# Patient Record
Sex: Female | Born: 1935
Health system: Southern US, Community
[De-identification: ages and names within clinical notes are randomized; demographics above are authoritative.]

## PROBLEM LIST (undated history)

## (undated) ENCOUNTER — Emergency Department (HOSPITAL_BASED_OUTPATIENT_CLINIC_OR_DEPARTMENT_OTHER): Payer: Medicare Other

## (undated) DIAGNOSIS — F32A Depression, unspecified: Secondary | ICD-10-CM

## (undated) DIAGNOSIS — C169 Malignant neoplasm of stomach, unspecified: Secondary | ICD-10-CM

## (undated) DIAGNOSIS — F329 Major depressive disorder, single episode, unspecified: Secondary | ICD-10-CM

## (undated) DIAGNOSIS — M1990 Unspecified osteoarthritis, unspecified site: Secondary | ICD-10-CM

## (undated) DIAGNOSIS — F419 Anxiety disorder, unspecified: Secondary | ICD-10-CM

## (undated) DIAGNOSIS — H269 Unspecified cataract: Secondary | ICD-10-CM

## (undated) DIAGNOSIS — D126 Benign neoplasm of colon, unspecified: Secondary | ICD-10-CM

## (undated) DIAGNOSIS — C50919 Malignant neoplasm of unspecified site of unspecified female breast: Secondary | ICD-10-CM

## (undated) DIAGNOSIS — N289 Disorder of kidney and ureter, unspecified: Secondary | ICD-10-CM

## (undated) DIAGNOSIS — I10 Essential (primary) hypertension: Secondary | ICD-10-CM

## (undated) DIAGNOSIS — Z5189 Encounter for other specified aftercare: Secondary | ICD-10-CM

## (undated) DIAGNOSIS — Z8661 Personal history of infections of the central nervous system: Secondary | ICD-10-CM

## (undated) DIAGNOSIS — E785 Hyperlipidemia, unspecified: Secondary | ICD-10-CM

## (undated) DIAGNOSIS — Z923 Personal history of irradiation: Secondary | ICD-10-CM

## (undated) HISTORY — DX: Unspecified osteoarthritis, unspecified site: M19.90

## (undated) HISTORY — PX: OOPHORECTOMY: SHX86

## (undated) HISTORY — DX: Major depressive disorder, single episode, unspecified: F32.9

## (undated) HISTORY — DX: Personal history of infections of the central nervous system: Z86.61

## (undated) HISTORY — DX: Depression, unspecified: F32.A

## (undated) HISTORY — PX: OTHER SURGICAL HISTORY: SHX169

## (undated) HISTORY — DX: Unspecified cataract: H26.9

## (undated) HISTORY — DX: Benign neoplasm of colon, unspecified: D12.6

## (undated) HISTORY — DX: Malignant neoplasm of unspecified site of unspecified female breast: C50.919

## (undated) HISTORY — DX: Encounter for other specified aftercare: Z51.89

## (undated) HISTORY — PX: MASTECTOMY: SHX3

## (undated) HISTORY — DX: Hyperlipidemia, unspecified: E78.5

## (undated) HISTORY — DX: Anxiety disorder, unspecified: F41.9

## (undated) HISTORY — DX: Malignant neoplasm of stomach, unspecified: C16.9

## (undated) HISTORY — PX: UPPER GASTROINTESTINAL ENDOSCOPY: SHX188

## (undated) HISTORY — PX: CHOLECYSTECTOMY: SHX55

## (undated) HISTORY — PX: COLONOSCOPY: SHX174

## (undated) HISTORY — PX: BREAST SURGERY: SHX581

## (undated) NOTE — *Deleted (*Deleted)
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 Highly suspicious left breast mass at the 12 o'clock position 4 cm from the nipple. Recommendation is for ultrasound-guided biopsy. 2. Indeterminate hyperechoic mass at the 12 o'clock position 4 cm from the nipple, just superior to the index lesion. This may correspond with an additional asymmetry seen mammographically. Recommendation is for ultrasound-guided biopsy with close attention on post clip films to ensure mammographic correlation. 3. No suspicious left axillary lymphadenopathy. 4. No suspicious findings at the site of the right mastectomy bed palpable lump.   11/19/2019 Cancer Staging   Staging form: Breast, AJCC 8th Edition - Clinical stage from 11/19/2019: Stage IB (cT2, cN0, cM0, G2, ER+, PR+, HER2-) - Signed by Alla Feeling, NP on 11/23/2019   11/19/2019 Initial Biopsy   Diagnosis Breast, left, needle core biopsy, 12 o'clock - INVASIVE MAMMARY CARCINOMA - SEE COMMENT E-cadherin is POSITIVE supporting a ductal origin. PROGNOSTIC INDICATORS Results: IMMUNOHISTOCHEMICAL AND MORPHOMETRIC ANALYSIS PERFORMED MANUALLY The tumor cells are NEGATIVE for Her2 (1+). Estrogen Receptor: 95%, POSITIVE, STRONG STAINING INTENSITY Progesterone Receptor: 95%, POSITIVE, STRONG STAINING INTENSITY Proliferation Marker Ki67: 10%   11/23/2019 Initial Diagnosis   Malignant  neoplasm of left breast ([place])      CURRENT THERAPY:  ***  INTERVAL HISTORY: *** Cindy Wong is here for a follow up. She presents to the clinic alone.    REVIEW OF SYSTEMS:  *** Constitutional: Denies fevers, chills or abnormal weight loss Eyes: Denies blurriness of vision Ears, nose, mouth, throat, and face: Denies mucositis or sore throat Respiratory: Denies cough, dyspnea or wheezes Cardiovascular: Denies palpitation, chest discomfort or lower extremity swelling Gastrointestinal:  Denies nausea, heartburn or change in bowel habits Skin: Denies abnormal skin rashes Lymphatics: Denies new lymphadenopathy or easy bruising Neurological:Denies numbness, tingling or new weaknesses Behavioral/Psych: Mood is stable, no new changes  All other systems were reviewed with the patient and are negative.  MEDICAL HISTORY:  Past Medical History:  Diagnosis Date  . Adenomatous colon polyp    tubular  . Anxiety   . Arthritis   . Blood transfusion without reported diagnosis   . Breast cancer [facility]) 2007   right mastectomy  . Breast cancer ([place]) 10/2019   left breast IDC  . Cataract   . Depression   . Diabetes mellitus   . Hx of meningitis 2012  . Hyperlipemia   . Hypertension   . Personal history of radiation therapy   . Stomach cancer ([place]) 1997   partial gastrectomy    SURGICAL HISTORY: Past Surgical History:  Procedure Laterality Date  . ABDOMINAL HYSTERECTOMY  1974   Supracervical  . BREAST LUMPECTOMY Right 1993  . BREAST SURGERY     Mastectomy  . CHOLECYSTECTOMY    . COLONOSCOPY    . MASTECTOMY Right   . MASTECTOMY, PARTIAL Left 12/08/2019   Procedure: LEFT MASTECTOMY;  Surgeon: Donnie Mesa, MD;  Location: [facility];  Service: General;  Laterality: Left;  LMA AND PECTORAL BLOCK  . meningitis    . OOPHORECTOMY     One ovary removed--?side  . Stomach tumor    . UPPER GASTROINTESTINAL ENDOSCOPY      I have reviewed the social history and  family history with the patient and they are unchanged from previous note.  ALLERGIES:  is allergic to sulfonamide derivatives and sulfamethoxazole.  MEDICATIONS:  Current Outpatient Medications  Medication Sig Dispense Refill  . amLODipine (NORVASC) 5 MG tablet Take 5 mg by mouth daily. In the morning    . carvedilol (COREG) 6.25 MG tablet TAKE 1 TABLET(6.25 MG) BY MOUTH TWICE DAILY 60 tablet 3  . Cholecalciferol (D2000 ULTRA STRENGTH) 50 MCG (2000 UT) CAPS Take by mouth daily.    Marland Kitchen glipiZIDE (GLUCOTROL XL) 10 MG 24 hr tablet Take 10 mg by mouth daily.      . hydrochlorothiazide (HYDRODIURIL) 25 MG tablet Take 25 mg by mouth daily. In the morning  10  . HYDROcodone-acetaminophen (NORCO/VICODIN) 5-325 MG tablet Take 1 tablet by mouth every 6 (six) hours as needed for moderate pain. 20 tablet 0  . Linagliptin-Metformin HCl (JENTADUETO) 2.5-500 MG TABS Take 1 tablet by mouth 2 (two) times daily.     Marland Kitchen lisinopril (PRINIVIL,ZESTRIL) 40 MG tablet Take 40 mg by mouth daily. In the morning  0  . Multiple Vitamins-Minerals (CENTRUM SILVER PO) Take 1 tablet by mouth daily.     Glory Rosebush VERIO test strip 2 (two) times daily. for testing  2  . potassium chloride SA (K-DUR,KLOR-CON) 20 MEQ tablet Take 20 mEq by mouth daily.    . pravastatin (PRAVACHOL) 40 MG tablet Take 40 mg by mouth every evening.     Marland Kitchen ROYAL JELLY PO Take by mouth daily.     No current facility-administered medications for this visit.    PHYSICAL EXAMINATION: ECOG PERFORMANCE STATUS: {CHL ONC ECOG PS:[phone]}  There were no vitals filed for this visit. There were no vitals filed for this visit. *** GENERAL:alert, no distress and comfortable SKIN: skin color, texture, turgor are normal, no rashes or significant lesions EYES: normal, Conjunctiva are pink and non-injected, sclera clear {OROPHARYNX:no exudate, no erythema and lips, buccal mucosa, and tongue normal}  NECK: supple, thyroid normal size, non-tender, without  nodularity LYMPH:  no palpable lymphadenopathy in the cervical, axillary {or inguinal} LUNGS: clear to auscultation and percussion with normal breathing effort HEART: regular rate & rhythm and no murmurs and no lower extremity edema ABDOMEN:abdomen soft, non-tender and normal bowel sounds Musculoskeletal:no cyanosis of digits and no clubbing  NEURO: alert & oriented x 3 with fluent speech, no focal motor/sensory deficits  LABORATORY DATA:  I have reviewed the data as listed CBC Latest Ref Rng & Units 11/23/2019 11/21/2018 05/08/2018  WBC 4.0 - 10.5 K/uL 7.9 7.3 11.0(H)  Hemoglobin 12.0 - 15.0 g/dL 14.8 15.3(H) 13.4  Hematocrit 36 - 46 % 47.1(H) 48.9(H) 43.8  Platelets 150 - 400 K/uL 277 268 245     CMP Latest Ref Rng & Units 12/04/2019 11/23/2019 11/21/2018  Glucose 70 - 99 mg/dL 114(H) 134(H) 101(H)  BUN 8 - 23 mg/dL 23 19 18   Creatinine 0.44 - 1.00 mg/dL 1.02(H) 0.93 0.89  Sodium 135 - 145 mmol/L 140 140 142  Potassium 3.5 - 5.1 mmol/L 4.2 3.9 4.1  Chloride 98 - 111 mmol/L 105 106 106  CO2 22 - 32 mmol/L 24 29 25   Calcium 8.9 - 10.3 mg/dL 9.9 10.0  10.1  Total Protein 6.5 - 8.1 g/dL - 7.3 7.3  Total Bilirubin 0.3 - 1.2 mg/dL - 0.7 0.5  Alkaline Phos 38 - 126 U/L - 47 55  AST 15 - 41 U/L - 21 18  ALT 0 - 44 U/L - 16 13      RADIOGRAPHIC STUDIES: I have personally reviewed the radiological images as listed and agreed with the findings in the report. No results found.   ASSESSMENT & PLAN:  Cindy Wong is a 67 y.o. female with    1. Malignant neoplasm of the left breast, grade 2, ER 95% strongly positive, PR 95% strongly positive, HER-2 negative (1+) Ki-67 10%, cT2 N0 M0 stage Ib -Diangosed in 10/2019 by ound on routine screening mammogram in 10/2019. She underwent left mastectomy with Dr Georgette Dover on 12/08/19.  ***  2. Genetics  -She has personal h/o DCIS and GIST, now new left breast cancer. She qualifies for genetics.  -Sister passed from cancer, unknown type -Does not  have children, has sibling and nieces  -I previously referred her to genetics. She did not proceed with visit.   3. History of recurrent right breast DCIS  -Diagnosed initially in 1993, s/p lumpectomy and adjuvant radiation.  She reportedly tried antiestrogen therapy but did not tolerate well -She had a recurrence of ER/PR positive DCIS in 07/2005, s/p right mastectomy on 08/24/2005 -She has been compliant with surveillance plan  4. H/O GIST -Diagnosed in 1997, s/p partial gastrectomy, followed by Dr. Ardis Hughs  -no clinical signs of recurrence   5. Social  -Sister passed in June from cancer (in the abdomen ?), she was depressed for 3-4 months this year but better recently  -Has [place] provider at [facility] f/u today, will wait until f/u with surgery  -Encouraged to have brother come with her to appt    PLAN: ***   No problem-specific Assessment & Plan notes found for this encounter.   No orders of the defined types were placed in this encounter.  All questions were answered. The patient knows to call the clinic with any problems, questions or concerns. No barriers to learning was detected. The total time spent in the appointment was {CHL ONC TIME VISIT - [id number].     Joslyn Devon 12/23/2019   Oneal Deputy, am acting as scribe for Truitt Merle, MD.   {Add scribe attestation statement}

---

## 1972-02-20 HISTORY — PX: ABDOMINAL HYSTERECTOMY: SHX81

## 1991-02-20 HISTORY — PX: BREAST LUMPECTOMY: SHX2

## 1995-02-20 DIAGNOSIS — C169 Malignant neoplasm of stomach, unspecified: Secondary | ICD-10-CM

## 1995-02-20 HISTORY — DX: Malignant neoplasm of stomach, unspecified: C16.9

## 1997-08-19 ENCOUNTER — Ambulatory Visit (HOSPITAL_COMMUNITY): Admission: RE | Admit: 1997-08-19 | Discharge: 1997-08-19 | Payer: Self-pay | Admitting: *Deleted

## 1998-06-27 ENCOUNTER — Other Ambulatory Visit: Admission: RE | Admit: 1998-06-27 | Discharge: 1998-06-27 | Payer: Self-pay | Admitting: *Deleted

## 1998-10-11 ENCOUNTER — Encounter (INDEPENDENT_AMBULATORY_CARE_PROVIDER_SITE_OTHER): Payer: Self-pay

## 1998-10-11 ENCOUNTER — Ambulatory Visit (HOSPITAL_COMMUNITY): Admission: RE | Admit: 1998-10-11 | Discharge: 1998-10-11 | Payer: Self-pay | Admitting: *Deleted

## 1999-07-19 ENCOUNTER — Other Ambulatory Visit: Admission: RE | Admit: 1999-07-19 | Discharge: 1999-07-19 | Payer: Self-pay | Admitting: *Deleted

## 1999-08-18 ENCOUNTER — Encounter: Admission: RE | Admit: 1999-08-18 | Discharge: 1999-08-18 | Payer: Self-pay | Admitting: Internal Medicine

## 1999-08-18 ENCOUNTER — Encounter: Payer: Self-pay | Admitting: Internal Medicine

## 1999-12-09 ENCOUNTER — Ambulatory Visit (HOSPITAL_COMMUNITY): Admission: RE | Admit: 1999-12-09 | Discharge: 1999-12-09 | Payer: Self-pay | Admitting: Oncology

## 1999-12-09 ENCOUNTER — Encounter (HOSPITAL_COMMUNITY): Payer: Self-pay | Admitting: Oncology

## 2000-11-05 ENCOUNTER — Other Ambulatory Visit: Admission: RE | Admit: 2000-11-05 | Discharge: 2000-11-05 | Payer: Self-pay | Admitting: Obstetrics and Gynecology

## 2001-04-28 ENCOUNTER — Encounter: Admission: RE | Admit: 2001-04-28 | Discharge: 2001-07-27 | Payer: Self-pay | Admitting: Internal Medicine

## 2001-08-11 ENCOUNTER — Encounter (HOSPITAL_COMMUNITY): Payer: Self-pay | Admitting: Oncology

## 2001-08-11 ENCOUNTER — Ambulatory Visit (HOSPITAL_COMMUNITY): Admission: RE | Admit: 2001-08-11 | Discharge: 2001-08-11 | Payer: Self-pay | Admitting: Oncology

## 2001-09-10 ENCOUNTER — Encounter (INDEPENDENT_AMBULATORY_CARE_PROVIDER_SITE_OTHER): Payer: Self-pay | Admitting: Specialist

## 2001-09-10 ENCOUNTER — Ambulatory Visit (HOSPITAL_COMMUNITY): Admission: RE | Admit: 2001-09-10 | Discharge: 2001-09-10 | Payer: Self-pay | Admitting: *Deleted

## 2001-11-05 ENCOUNTER — Other Ambulatory Visit: Admission: RE | Admit: 2001-11-05 | Discharge: 2001-11-05 | Payer: Self-pay | Admitting: Obstetrics and Gynecology

## 2001-11-07 ENCOUNTER — Ambulatory Visit (HOSPITAL_COMMUNITY): Admission: RE | Admit: 2001-11-07 | Discharge: 2001-11-07 | Payer: Self-pay | Admitting: *Deleted

## 2002-08-13 ENCOUNTER — Encounter: Admission: RE | Admit: 2002-08-13 | Discharge: 2002-11-11 | Payer: Self-pay | Admitting: Internal Medicine

## 2002-11-10 ENCOUNTER — Other Ambulatory Visit: Admission: RE | Admit: 2002-11-10 | Discharge: 2002-11-10 | Payer: Self-pay | Admitting: Obstetrics and Gynecology

## 2004-03-12 ENCOUNTER — Emergency Department (HOSPITAL_COMMUNITY): Admission: EM | Admit: 2004-03-12 | Discharge: 2004-03-12 | Payer: Self-pay | Admitting: Emergency Medicine

## 2004-08-28 ENCOUNTER — Ambulatory Visit: Payer: Self-pay | Admitting: Oncology

## 2004-11-13 ENCOUNTER — Other Ambulatory Visit: Admission: RE | Admit: 2004-11-13 | Discharge: 2004-11-13 | Payer: Self-pay | Admitting: Obstetrics and Gynecology

## 2005-02-19 DIAGNOSIS — C50919 Malignant neoplasm of unspecified site of unspecified female breast: Secondary | ICD-10-CM

## 2005-02-19 HISTORY — DX: Malignant neoplasm of unspecified site of unspecified female breast: C50.919

## 2005-06-07 ENCOUNTER — Ambulatory Visit: Payer: Self-pay | Admitting: Oncology

## 2005-06-07 LAB — CBC WITH DIFFERENTIAL/PLATELET
BASO%: 1.2 % (ref 0.0–2.0)
Basophils Absolute: 0.1 10*3/uL (ref 0.0–0.1)
EOS%: 1.3 % (ref 0.0–7.0)
Eosinophils Absolute: 0.1 10*3/uL (ref 0.0–0.5)
HCT: 40.8 % (ref 34.8–46.6)
HGB: 13.3 g/dL (ref 11.6–15.9)
LYMPH%: 28.6 % (ref 14.0–48.0)
MCH: 26.5 pg (ref 26.0–34.0)
MCHC: 32.5 g/dL (ref 32.0–36.0)
MCV: 81.7 fL (ref 81.0–101.0)
MONO#: 0.6 10*3/uL (ref 0.1–0.9)
MONO%: 6.5 % (ref 0.0–13.0)
NEUT#: 5.3 10*3/uL (ref 1.5–6.5)
NEUT%: 62.4 % (ref 39.6–76.8)
Platelets: 361 10*3/uL (ref 145–400)
RBC: 5 10*6/uL (ref 3.70–5.32)
RDW: 13.7 % (ref 11.3–14.5)
WBC: 8.5 10*3/uL (ref 3.9–10.0)
lymph#: 2.4 10*3/uL (ref 0.9–3.3)

## 2005-06-07 LAB — COMPREHENSIVE METABOLIC PANEL
ALT: 25 U/L (ref 0–40)
AST: 23 U/L (ref 0–37)
Albumin: 4.3 g/dL (ref 3.5–5.2)
Alkaline Phosphatase: 44 U/L (ref 39–117)
BUN: 17 mg/dL (ref 6–23)
CO2: 30 mEq/L (ref 19–32)
Calcium: 9.9 mg/dL (ref 8.4–10.5)
Chloride: 105 mEq/L (ref 96–112)
Creatinine, Ser: 1 mg/dL (ref 0.4–1.2)
Glucose, Bld: 67 mg/dL — ABNORMAL LOW (ref 70–99)
Potassium: 3.9 mEq/L (ref 3.5–5.3)
Sodium: 142 mEq/L (ref 135–145)
Total Bilirubin: 0.3 mg/dL (ref 0.3–1.2)
Total Protein: 7 g/dL (ref 6.0–8.3)

## 2005-06-07 LAB — LACTATE DEHYDROGENASE: LDH: 156 U/L (ref 94–250)

## 2005-08-03 ENCOUNTER — Encounter: Admission: RE | Admit: 2005-08-03 | Discharge: 2005-08-03 | Payer: Self-pay | Admitting: Internal Medicine

## 2005-08-03 ENCOUNTER — Encounter (INDEPENDENT_AMBULATORY_CARE_PROVIDER_SITE_OTHER): Payer: Self-pay | Admitting: Diagnostic Radiology

## 2005-08-03 ENCOUNTER — Encounter (INDEPENDENT_AMBULATORY_CARE_PROVIDER_SITE_OTHER): Payer: Self-pay | Admitting: *Deleted

## 2005-08-14 ENCOUNTER — Encounter: Admission: RE | Admit: 2005-08-14 | Discharge: 2005-08-14 | Payer: Self-pay | Admitting: Internal Medicine

## 2005-08-17 ENCOUNTER — Encounter: Admission: RE | Admit: 2005-08-17 | Discharge: 2005-08-17 | Payer: Self-pay

## 2005-08-24 ENCOUNTER — Encounter (INDEPENDENT_AMBULATORY_CARE_PROVIDER_SITE_OTHER): Payer: Self-pay | Admitting: Specialist

## 2005-08-24 ENCOUNTER — Ambulatory Visit (HOSPITAL_COMMUNITY): Admission: RE | Admit: 2005-08-24 | Discharge: 2005-08-25 | Payer: Self-pay

## 2006-02-08 ENCOUNTER — Encounter: Admission: RE | Admit: 2006-02-08 | Discharge: 2006-02-08 | Payer: Self-pay | Admitting: Internal Medicine

## 2006-06-03 ENCOUNTER — Ambulatory Visit: Payer: Self-pay | Admitting: Oncology

## 2006-06-06 LAB — CBC WITH DIFFERENTIAL/PLATELET
BASO%: 1.3 % (ref 0.0–2.0)
Basophils Absolute: 0.1 10*3/uL (ref 0.0–0.1)
EOS%: 2 % (ref 0.0–7.0)
Eosinophils Absolute: 0.1 10*3/uL (ref 0.0–0.5)
HCT: 41.3 % (ref 34.8–46.6)
HGB: 13.9 g/dL (ref 11.6–15.9)
LYMPH%: 32.5 % (ref 14.0–48.0)
MCH: 27.4 pg (ref 26.0–34.0)
MCHC: 33.6 g/dL (ref 32.0–36.0)
MCV: 81.6 fL (ref 81.0–101.0)
MONO#: 0.4 10*3/uL (ref 0.1–0.9)
MONO%: 6 % (ref 0.0–13.0)
NEUT#: 4.1 10*3/uL (ref 1.5–6.5)
NEUT%: 58.2 % (ref 39.6–76.8)
Platelets: 318 10*3/uL (ref 145–400)
RBC: 5.06 10*6/uL (ref 3.70–5.32)
RDW: 12.9 % (ref 11.3–14.5)
WBC: 7.1 10*3/uL (ref 3.9–10.0)
lymph#: 2.3 10*3/uL (ref 0.9–3.3)

## 2006-06-06 LAB — COMPREHENSIVE METABOLIC PANEL
ALT: 17 U/L (ref 0–35)
AST: 20 U/L (ref 0–37)
Albumin: 4.4 g/dL (ref 3.5–5.2)
Alkaline Phosphatase: 44 U/L (ref 39–117)
BUN: 22 mg/dL (ref 6–23)
CO2: 28 mEq/L (ref 19–32)
Calcium: 9.9 mg/dL (ref 8.4–10.5)
Chloride: 102 mEq/L (ref 96–112)
Creatinine, Ser: 1.12 mg/dL (ref 0.40–1.20)
Glucose, Bld: 138 mg/dL — ABNORMAL HIGH (ref 70–99)
Potassium: 4.2 mEq/L (ref 3.5–5.3)
Sodium: 140 mEq/L (ref 135–145)
Total Bilirubin: 0.5 mg/dL (ref 0.3–1.2)
Total Protein: 7.2 g/dL (ref 6.0–8.3)

## 2006-06-06 LAB — LACTATE DEHYDROGENASE: LDH: 155 U/L (ref 94–250)

## 2006-07-25 ENCOUNTER — Ambulatory Visit (HOSPITAL_COMMUNITY): Admission: RE | Admit: 2006-07-25 | Discharge: 2006-07-25 | Payer: Self-pay | Admitting: *Deleted

## 2006-07-25 ENCOUNTER — Encounter (INDEPENDENT_AMBULATORY_CARE_PROVIDER_SITE_OTHER): Payer: Self-pay | Admitting: *Deleted

## 2006-08-06 ENCOUNTER — Encounter: Admission: RE | Admit: 2006-08-06 | Discharge: 2006-08-06 | Payer: Self-pay | Admitting: Internal Medicine

## 2006-11-21 ENCOUNTER — Other Ambulatory Visit: Admission: RE | Admit: 2006-11-21 | Discharge: 2006-11-21 | Payer: Self-pay | Admitting: Obstetrics and Gynecology

## 2006-11-25 ENCOUNTER — Encounter: Admission: RE | Admit: 2006-11-25 | Discharge: 2006-11-25 | Payer: Self-pay | Admitting: Obstetrics and Gynecology

## 2006-12-03 ENCOUNTER — Ambulatory Visit: Payer: Self-pay | Admitting: Oncology

## 2006-12-24 ENCOUNTER — Ambulatory Visit (HOSPITAL_COMMUNITY): Admission: RE | Admit: 2006-12-24 | Discharge: 2006-12-24 | Payer: Self-pay | Admitting: Oncology

## 2006-12-24 LAB — COMPREHENSIVE METABOLIC PANEL
ALT: 15 U/L (ref 0–35)
AST: 17 U/L (ref 0–37)
Albumin: 4.2 g/dL (ref 3.5–5.2)
Alkaline Phosphatase: 42 U/L (ref 39–117)
BUN: 24 mg/dL — ABNORMAL HIGH (ref 6–23)
CO2: 26 mEq/L (ref 19–32)
Calcium: 9.8 mg/dL (ref 8.4–10.5)
Chloride: 105 mEq/L (ref 96–112)
Creatinine, Ser: 1.28 mg/dL — ABNORMAL HIGH (ref 0.40–1.20)
Glucose, Bld: 115 mg/dL — ABNORMAL HIGH (ref 70–99)
Potassium: 4.1 mEq/L (ref 3.5–5.3)
Sodium: 142 mEq/L (ref 135–145)
Total Bilirubin: 0.4 mg/dL (ref 0.3–1.2)
Total Protein: 7.1 g/dL (ref 6.0–8.3)

## 2006-12-24 LAB — CBC WITH DIFFERENTIAL/PLATELET
BASO%: 0.8 % (ref 0.0–2.0)
Basophils Absolute: 0.1 10*3/uL (ref 0.0–0.1)
EOS%: 1.4 % (ref 0.0–7.0)
Eosinophils Absolute: 0.1 10*3/uL (ref 0.0–0.5)
HCT: 39.5 % (ref 34.8–46.6)
HGB: 13.2 g/dL (ref 11.6–15.9)
LYMPH%: 20.2 % (ref 14.0–48.0)
MCH: 27.1 pg (ref 26.0–34.0)
MCHC: 33.3 g/dL (ref 32.0–36.0)
MCV: 81.2 fL (ref 81.0–101.0)
MONO#: 0.2 10*3/uL (ref 0.1–0.9)
MONO%: 2.7 % (ref 0.0–13.0)
NEUT#: 6.9 10*3/uL — ABNORMAL HIGH (ref 1.5–6.5)
NEUT%: 74.9 % (ref 39.6–76.8)
Platelets: 334 10*3/uL (ref 145–400)
RBC: 4.86 10*6/uL (ref 3.70–5.32)
RDW: 13.4 % (ref 11.3–14.5)
WBC: 9.2 10*3/uL (ref 3.9–10.0)
lymph#: 1.9 10*3/uL (ref 0.9–3.3)

## 2006-12-24 LAB — LACTATE DEHYDROGENASE: LDH: 155 U/L (ref 94–250)

## 2007-02-03 ENCOUNTER — Encounter: Admission: RE | Admit: 2007-02-03 | Discharge: 2007-02-03 | Payer: Self-pay | Admitting: Oncology

## 2007-02-07 ENCOUNTER — Encounter: Admission: RE | Admit: 2007-02-07 | Discharge: 2007-02-07 | Payer: Self-pay | Admitting: Oncology

## 2007-06-18 ENCOUNTER — Ambulatory Visit: Payer: Self-pay | Admitting: Oncology

## 2007-06-23 LAB — COMPREHENSIVE METABOLIC PANEL
ALT: 19 U/L (ref 0–35)
AST: 17 U/L (ref 0–37)
Albumin: 4.3 g/dL (ref 3.5–5.2)
Alkaline Phosphatase: 46 U/L (ref 39–117)
BUN: 18 mg/dL (ref 6–23)
CO2: 25 mEq/L (ref 19–32)
Calcium: 9.7 mg/dL (ref 8.4–10.5)
Chloride: 104 mEq/L (ref 96–112)
Creatinine, Ser: 0.97 mg/dL (ref 0.40–1.20)
Glucose, Bld: 146 mg/dL — ABNORMAL HIGH (ref 70–99)
Potassium: 4.2 mEq/L (ref 3.5–5.3)
Sodium: 140 mEq/L (ref 135–145)
Total Bilirubin: 0.4 mg/dL (ref 0.3–1.2)
Total Protein: 7.2 g/dL (ref 6.0–8.3)

## 2007-06-23 LAB — CBC WITH DIFFERENTIAL/PLATELET
BASO%: 0.6 % (ref 0.0–2.0)
Basophils Absolute: 0.1 10*3/uL (ref 0.0–0.1)
EOS%: 1.6 % (ref 0.0–7.0)
Eosinophils Absolute: 0.1 10*3/uL (ref 0.0–0.5)
HCT: 41.8 % (ref 34.8–46.6)
HGB: 13.9 g/dL (ref 11.6–15.9)
LYMPH%: 29.8 % (ref 14.0–48.0)
MCH: 26.7 pg (ref 26.0–34.0)
MCHC: 33.3 g/dL (ref 32.0–36.0)
MCV: 80.2 fL — ABNORMAL LOW (ref 81.0–101.0)
MONO#: 0.5 10*3/uL (ref 0.1–0.9)
MONO%: 5.9 % (ref 0.0–13.0)
NEUT#: 5.5 10*3/uL (ref 1.5–6.5)
NEUT%: 62.1 % (ref 39.6–76.8)
Platelets: 351 10*3/uL (ref 145–400)
RBC: 5.21 10*6/uL (ref 3.70–5.32)
RDW: 13.4 % (ref 11.3–14.5)
WBC: 8.9 10*3/uL (ref 3.9–10.0)
lymph#: 2.7 10*3/uL (ref 0.9–3.3)

## 2007-06-23 LAB — LACTATE DEHYDROGENASE: LDH: 148 U/L (ref 94–250)

## 2007-12-19 ENCOUNTER — Ambulatory Visit: Payer: Self-pay | Admitting: Oncology

## 2007-12-23 LAB — CBC WITH DIFFERENTIAL/PLATELET
BASO%: 0.3 % (ref 0.0–2.0)
Basophils Absolute: 0 10*3/uL (ref 0.0–0.1)
EOS%: 1.9 % (ref 0.0–7.0)
Eosinophils Absolute: 0.1 10*3/uL (ref 0.0–0.5)
HCT: 40.3 % (ref 34.8–46.6)
HGB: 13.1 g/dL (ref 11.6–15.9)
LYMPH%: 25.2 % (ref 14.0–48.0)
MCH: 26.8 pg (ref 26.0–34.0)
MCHC: 32.5 g/dL (ref 32.0–36.0)
MCV: 82.5 fL (ref 81.0–101.0)
MONO#: 0.4 10*3/uL (ref 0.1–0.9)
MONO%: 4.6 % (ref 0.0–13.0)
NEUT#: 5.3 10*3/uL (ref 1.5–6.5)
NEUT%: 68 % (ref 39.6–76.8)
Platelets: 327 10*3/uL (ref 145–400)
RBC: 4.89 10*6/uL (ref 3.70–5.32)
RDW: 13 % (ref 11.3–14.5)
WBC: 7.7 10*3/uL (ref 3.9–10.0)
lymph#: 1.9 10*3/uL (ref 0.9–3.3)

## 2007-12-23 LAB — COMPREHENSIVE METABOLIC PANEL
ALT: 14 U/L (ref 0–35)
AST: 17 U/L (ref 0–37)
Albumin: 4.1 g/dL (ref 3.5–5.2)
Alkaline Phosphatase: 44 U/L (ref 39–117)
BUN: 19 mg/dL (ref 6–23)
CO2: 27 mEq/L (ref 19–32)
Calcium: 10.2 mg/dL (ref 8.4–10.5)
Chloride: 102 mEq/L (ref 96–112)
Creatinine, Ser: 1.1 mg/dL (ref 0.40–1.20)
Glucose, Bld: 156 mg/dL — ABNORMAL HIGH (ref 70–99)
Potassium: 4.3 mEq/L (ref 3.5–5.3)
Sodium: 139 mEq/L (ref 135–145)
Total Bilirubin: 0.4 mg/dL (ref 0.3–1.2)
Total Protein: 6.8 g/dL (ref 6.0–8.3)

## 2007-12-23 LAB — LACTATE DEHYDROGENASE: LDH: 137 U/L (ref 94–250)

## 2008-01-13 ENCOUNTER — Ambulatory Visit: Payer: Self-pay | Admitting: Obstetrics and Gynecology

## 2008-02-04 ENCOUNTER — Encounter: Admission: RE | Admit: 2008-02-04 | Discharge: 2008-02-04 | Payer: Self-pay | Admitting: General Surgery

## 2008-06-17 ENCOUNTER — Ambulatory Visit: Payer: Self-pay | Admitting: Oncology

## 2009-01-17 ENCOUNTER — Other Ambulatory Visit: Admission: RE | Admit: 2009-01-17 | Discharge: 2009-01-17 | Payer: Self-pay | Admitting: Obstetrics and Gynecology

## 2009-01-17 ENCOUNTER — Ambulatory Visit: Payer: Self-pay | Admitting: Obstetrics and Gynecology

## 2009-02-07 ENCOUNTER — Encounter: Admission: RE | Admit: 2009-02-07 | Discharge: 2009-02-07 | Payer: Self-pay | Admitting: Internal Medicine

## 2009-06-20 ENCOUNTER — Ambulatory Visit: Payer: Self-pay | Admitting: Oncology

## 2009-06-21 ENCOUNTER — Ambulatory Visit (HOSPITAL_COMMUNITY): Admission: RE | Admit: 2009-06-21 | Discharge: 2009-06-21 | Payer: Self-pay | Admitting: Oncology

## 2009-06-21 LAB — COMPREHENSIVE METABOLIC PANEL
ALT: 14 U/L (ref 0–35)
AST: 18 U/L (ref 0–37)
Albumin: 4.2 g/dL (ref 3.5–5.2)
Alkaline Phosphatase: 44 U/L (ref 39–117)
BUN: 19 mg/dL (ref 6–23)
CO2: 27 mEq/L (ref 19–32)
Calcium: 10 mg/dL (ref 8.4–10.5)
Chloride: 101 mEq/L (ref 96–112)
Creatinine, Ser: 1.1 mg/dL (ref 0.40–1.20)
Glucose, Bld: 130 mg/dL — ABNORMAL HIGH (ref 70–99)
Potassium: 4.2 mEq/L (ref 3.5–5.3)
Sodium: 141 mEq/L (ref 135–145)
Total Bilirubin: 0.4 mg/dL (ref 0.3–1.2)
Total Protein: 6.9 g/dL (ref 6.0–8.3)

## 2009-06-21 LAB — CBC WITH DIFFERENTIAL/PLATELET
BASO%: 0.2 % (ref 0.0–2.0)
Basophils Absolute: 0 10*3/uL (ref 0.0–0.1)
EOS%: 1.6 % (ref 0.0–7.0)
Eosinophils Absolute: 0.1 10*3/uL (ref 0.0–0.5)
HCT: 40.2 % (ref 34.8–46.6)
HGB: 13.2 g/dL (ref 11.6–15.9)
LYMPH%: 24.5 % (ref 14.0–49.7)
MCH: 27.4 pg (ref 25.1–34.0)
MCHC: 33 g/dL (ref 31.5–36.0)
MCV: 83 fL (ref 79.5–101.0)
MONO#: 0.4 10*3/uL (ref 0.1–0.9)
MONO%: 4.3 % (ref 0.0–14.0)
NEUT#: 5.9 10*3/uL (ref 1.5–6.5)
NEUT%: 69.4 % (ref 38.4–76.8)
Platelets: 338 10*3/uL (ref 145–400)
RBC: 4.84 10*6/uL (ref 3.70–5.45)
RDW: 13.1 % (ref 11.2–14.5)
WBC: 8.4 10*3/uL (ref 3.9–10.3)
lymph#: 2.1 10*3/uL (ref 0.9–3.3)

## 2009-06-21 LAB — LACTATE DEHYDROGENASE: LDH: 148 U/L (ref 94–250)

## 2009-07-06 ENCOUNTER — Encounter: Payer: Self-pay | Admitting: Gastroenterology

## 2009-07-07 ENCOUNTER — Encounter: Payer: Self-pay | Admitting: Gastroenterology

## 2009-07-12 ENCOUNTER — Encounter (INDEPENDENT_AMBULATORY_CARE_PROVIDER_SITE_OTHER): Payer: Self-pay | Admitting: *Deleted

## 2009-07-20 ENCOUNTER — Ambulatory Visit: Payer: Self-pay | Admitting: Gastroenterology

## 2009-07-25 DIAGNOSIS — K921 Melena: Secondary | ICD-10-CM | POA: Insufficient documentation

## 2009-07-26 ENCOUNTER — Encounter (INDEPENDENT_AMBULATORY_CARE_PROVIDER_SITE_OTHER): Payer: Self-pay | Admitting: *Deleted

## 2009-07-27 ENCOUNTER — Ambulatory Visit: Payer: Self-pay | Admitting: Gastroenterology

## 2009-08-03 ENCOUNTER — Ambulatory Visit: Payer: Self-pay | Admitting: Gastroenterology

## 2009-08-05 ENCOUNTER — Encounter: Payer: Self-pay | Admitting: Gastroenterology

## 2010-01-24 ENCOUNTER — Ambulatory Visit: Payer: Self-pay | Admitting: Obstetrics and Gynecology

## 2010-02-10 ENCOUNTER — Encounter
Admission: RE | Admit: 2010-02-10 | Discharge: 2010-02-10 | Payer: Self-pay | Source: Home / Self Care | Attending: Internal Medicine | Admitting: Internal Medicine

## 2010-02-19 DIAGNOSIS — Z8661 Personal history of infections of the central nervous system: Secondary | ICD-10-CM

## 2010-02-19 HISTORY — DX: Personal history of infections of the central nervous system: Z86.61

## 2010-03-23 NOTE — Letter (Signed)
Summary: [facility]  [facility]   Imported By: Bubba Hales 07/28/2009 09:32:40  _____________________________________________________________________  External Attachment:    Type:   Image     Comment:   External Document

## 2010-03-23 NOTE — Procedures (Signed)
Summary: Colon   Colonoscopy  Procedure date:  07/25/2006  Findings:      Location:  [facility].   NAME:  Cindy Wong, Cindy Wong               ACCOUNT NO.:  [id number]      MEDICAL RECORD NO.:  [id number]          PATIENT TYPE:  AMB      LOCATION:  ENDO                         FACILITY:  [facility]      PHYSICIAN:  Waverly Ferrari, M.D.    DATE OF BIRTH:  [birth year]      DATE OF PROCEDURE:  07/25/2006   DATE OF DISCHARGE:                                  OPERATIVE REPORT      PROCEDURE:  Colonoscopy.      INDICATIONS:  Colon cancer screening.      ANESTHESIA:  Demerol 25 mg, Versed 2.5 mg.      PROCEDURE:  With the patient mildly sedated in the left lateral   decubitus position the Pentax videoscopic colonoscope was inserted in   the rectum, passed through a very tight sigmoid colon presumably   secondary to adhesions, but with pressure applied we were able to reach   the cecum as identified by ileocecal valve and base of cecum both of   which were photographed.  In the base of the cecum there was patchy   erythematous changes.  One small area appeared to be a polyp which was   removed but there were other areas that we photographed only.  From this   point the colonoscope was slowly withdrawn, taking circumferential views   of the colonic mucosa, stopping that only in the rectum which appeared   normal on direct and retroflexed view.  The endoscope was straightened   and withdrawn.  The patient's vital signs, pulse oximeter remained   stable.  The patient tolerated procedure well without apparent   complications.      FINDINGS:  Cecal polyp with tight tortuous sigmoid colon, otherwise an   unremarkable exam.      PLAN:  Await biopsy report.  The patient will call me for results and   follow-up with me as needed as an outpatient.                  ______________________________   Waverly Ferrari, M.D.            GMO/MEDQ  D:  07/25/2006  T:  07/25/2006  Job:  [id number]        cc:   [name], M.D.

## 2010-03-23 NOTE — Assessment & Plan Note (Signed)
History of Present Illness Visit Type: consult  Primary GI MD: Owens Loffler MD Primary Provider: Domenick Gong, MD Requesting Provider: Domenick Gong, MD History of Present Illness:     very pleasant 77 year old woman with a history of gastric GI stromal tumor, resected in 1997.  who was told to come here by PCP.    she give a stool sample at the time of her recent routine annual examination, she thinks the stool may have been positive for occult blood but is not certain.  overall stable weight.  No overt GI bleeding.  No changes in her bowels.  HAs noticed some dark stools around time of iron supplements.  she had a colonoscopy by Dr. Jim Desanctis in 2008. Polyp was removed however it was proven to be hyperplastic and not adenomatous.  She also underwent EGD at the same time and this showed no evidence of recurrent tumors of her stomach.           Current Medications (verified): 1)  Lisinopril 20 Mg Tabs (Lisinopril) .Marland Kitchen.. 1 By Mouth Once Daily 2)  Glucotrol Xl 10 Mg Xr24h-Tab (Glipizide) .Marland Kitchen.. 1 By Mouth Once Daily 3)  Glucophage 500 Mg Tabs (Metformin Hcl) .Marland Kitchen.. 1 By Mouth Once Daily 4)  Triamterene-Hctz 37.5-25 Mg Tabs (Triamterene-Hctz) .Marland Kitchen.. 1 By Mouth Once Daily 5)  Pravachol 40 Mg Tabs (Pravastatin Sodium) .Marland Kitchen.. 1 By Mouth Once Daily 6)  Fosamax 70 Mg Tabs (Alendronate Sodium) .Marland Kitchen.. 1 By Mouth Weekly 7)  Diazepam 5 Mg Tabs (Diazepam) .... 1/2 By Mouth Two Times A Day As Needed 8)  Vitamin D 1000 Unit Tabs (Cholecalciferol) .... One Tablet By Mouth Two Times A Day 9)  Multivitamins   Tabs (Multiple Vitamin) .... One Tablet By Mouth Once Daily  Allergies (verified): 1)  ! Sulfa  Past History:  Past Medical History: Hyperlipidemia Hypertension osteopenia Anxiety Disorder Depression gastric GI stromal tumor, resected 1997 Breast Cancer Diabetes Routine risk for colon cancer, last colonoscopy 2008 found hyperplastic polyp  Past Surgical History: Hysterectomy  1974 Breast-Lumpectomy  Cholecystectomy 1973 Gastric GI stromal tumor resected 1997  Family History: sister had rectal melanoma, may have also had colon cancer  Social History: she is widowed, she is retired, she does not smoke cigarettes or drink alcohol. She has no children.  Review of Systems       Pertinent positive and negative review of systems were noted in the above HPI and GI specific review of systems.  All other review of systems was otherwise negative.   Vital Signs:  Patient profile:   77 year old female Height:      67 inches Weight:      161 pounds BMI:     25.31 BSA:     1.85 Pulse rate:   60 / minute Pulse rhythm:   regular BP sitting:   132 / 64  (left arm) Cuff size:   regular  Vitals Entered By: Hope Pigeon CMA (July 20, 2009 10:54 AM)  Physical Exam  Additional Exam:  Constitutional: generally well appearing Psychiatric: alert and oriented times 3 Eyes: extraocular movements intact Mouth: oropharynx moist, no lesions Neck: supple, no lymphadenopathy Cardiovascular: heart regular rate and rythm Lungs: CTA bilaterally Abdomen: soft, non-tender, non-distended, no obvious ascites, no peritoneal signs, normal bowel sounds Extremities: no lower extremity edema bilaterally Skin: no lesions on visible extremities    Impression & Recommendations:  Problem # 1:  family history of colon cancer, FOB+? we will get records from her recent  annual physical to see if she has occult let in her stool. If she does then we will schedule her for a colonoscopy. If she does not, she is not due for another colonoscopy until 2013 given her family history of colon cancer.  Problem # 2:  1997 gastric GI stromal tumor resection she has had at least 2 EGDs since then. Most recent was 3 years ago and showed no sign of recurrence. She has no symptoms of gastric disease I do not think we need to survey her stomach any longer unless new symptoms arise.  Patient  Instructions: 1)  We will get records from PCP, specifically to see if stool was occult positive for blood. 2)  If the stool is positive for microscopic blood, you need a  colonoscopy. 3)  If the stool is negative for blood, then next colonoscopy should be in 2013 (which is 5 years from your previous one). 4)  You do not need surveillance from previous GI stromal tumor of stomach given negative EGD in 2008. 5)  A copy of this information will be sent to Dr. Osborne Casco. 6)  The medication list was reviewed and reconciled.  All changed / newly prescribed medications were explained.  A complete medication list was provided to the patient / caregiver.  Appended Document:  received faxed note from PCP.  Hemosure from 5/19 was positive.  Please call patient and set her up with colonoscopy, thanks.  Appended Document:  left message on machine to call back   Appended Document: problem update colon and previsit scheduled pt aware   Clinical Lists Changes  Problems: Added new problem of BLOOD IN STOOL (ICD-578.1) Added new problem of ADENOCARCINOMA, COLON, FAMILY HX (ICD-V16.0)

## 2010-03-23 NOTE — Procedures (Signed)
Summary: EGD   EGD  Procedure date:  07/25/2006  Findings:      Location: [facility]   NAME:  Cindy Wong, Cindy Wong               ACCOUNT NO.:  [id number]      MEDICAL RECORD NO.:  [id number]          PATIENT TYPE:  AMB      LOCATION:  ENDO                         FACILITY:  [place]      PHYSICIAN:  Waverly Ferrari, M.D.    DATE OF BIRTH:  06/20/1931      DATE OF PROCEDURE:  07/25/2006   DATE OF DISCHARGE:                                  OPERATIVE REPORT      PROCEDURE:  Upper endoscopy.      INDICATIONS:  Follow-up of previous gastric tumor.      ANESTHESIA:  Demerol 75 mg and Versed 7.5 mg.      PROCEDURE:  With the patient mildly sedated in the left lateral   decubitus position, the Pentax videoscopic endoscope was inserted in the   mouth and passed under direct vision through the esophagus which   appeared normal until we reached the distal esophagus where there was a   nonobstructing semicircular stricture of the distal esophagus   photographed only.  We entered into the stomach.  Fundus, body, antrum,   duodenal bulb, second portion duodenum all visualized.  From this point   the endoscope was slowly withdrawn taking circumferential views of   duodenal mucosa until the endoscope had been pulled back in the stomach,   placed in retroflexion to view the stomach from below.  The endoscope   was straightened and withdrawn taking circumferential views of remaining   gastric and esophageal mucosa.  The patient's vital signs and pulse   oximeter remained stable.  The patient tolerated the procedure well   without apparent complications.      FINDINGS:  Benign nonsymptomatic distal esophageal stricture, otherwise   a very unremarkable examination status post partial gastric resection   for leiomyoma/leiomyosarcoma a number of years ago.      PLAN:  Proceed to colonoscopy.                  ______________________________   Waverly Ferrari, M.D.            GMO/MEDQ  D:   07/25/2006  T:  07/25/2006  Job:  [id number]      cc:   [name], M.D.

## 2010-03-23 NOTE — Letter (Signed)
Summary: [facility]  [facility]   Imported By: Phillis Knack 07/19/2009 09:39:46  _____________________________________________________________________  External Attachment:    Type:   Image     Comment:   External Document

## 2010-03-23 NOTE — Miscellaneous (Signed)
Summary: [facility]  Clinical Lists Changes  Medications: Added new medication of MOVIPREP 100 GM  SOLR (PEG-KCL-NACL-NASULF-NA ASC-C) As per prep instructions. - Signed Rx of MOVIPREP 100 GM  SOLR (PEG-KCL-NACL-NASULF-NA ASC-C) As per prep instructions.;  #1 x 0;  Signed;  Entered by: [name] RN;  Authorized by: Milus Banister MD;  Method used: Electronically to [facility]. [id number]*, [address] [place], [place], [place]  [address], Ph: [phone], Fax: [phone] Allergies: Changed allergy or adverse reaction from SULFA to SULFA    Prescriptions: MOVIPREP 100 GM  SOLR (PEG-KCL-NACL-NASULF-NA ASC-C) As per prep instructions.  #1 x 0   Entered by:   [name] RN   Authorized by:   Milus Banister MD   Signed by:   [name] RN on 07/27/2009   Method used:   Electronically to        [facility]. [phone]* (retail)       [address].       [place], [place]  [address]       Ph: [phone]       Fax: [phone]   RxID:   [id number]

## 2010-03-23 NOTE — Procedures (Signed)
Summary: Colonoscopy  Patient: Cindy Wong Note: All result statuses are Final unless otherwise noted.  Tests: (1) Colonoscopy (COL)   COL Colonoscopy           [facility].     [place], [place]  [address]           COLONOSCOPY PROCEDURE REPORT           PATIENT:  [name], [name]  MR#:  [id number]     BIRTHDATE:  [birth year], 78 yrs. old  GENDER:  female     ENDOSCOPIST:  Milus Banister, MD     REF. BY:  Domenick Gong, M.D.     PROCEDURE DATE:  08/03/2009     PROCEDURE:  Colonoscopy with biopsy     ASA CLASS:  Class II     INDICATIONS:  FOBT positive stool, sister may have had rectal     cancer     MEDICATIONS:   Fentanyl 50 mcg IV, Versed 7 mg IV           DESCRIPTION OF PROCEDURE:   After the risks benefits and     alternatives of the procedure were thoroughly explained, informed     consent was obtained.  Digital rectal exam was performed and     revealed no rectal masses.   The LB [id number]-[id number] endoscope     was introduced through the anus and advanced to the cecum, which     was identified by both the appendix and ileocecal valve, without     limitations.  The quality of the prep was good, using MoviPrep.     The instrument was then slowly withdrawn as the colon was fully     examined.     <<PROCEDUREIMAGES>>           FINDINGS:  A diminutive polyp was found. This was 1-49mm across,     located in descending colon, removed with forceps and sent to     pathology (jar 1) (see image4).  This was otherwise a normal     examination of the colon (see image6, image3, and image2).     Retroflexed views in the rectum revealed no abnormalities.    The     scope was then withdrawn from the patient and the procedure     completed.           COMPLICATIONS:  None     ENDOSCOPIC IMPRESSION:     1) Diminutive polyp in descending colon, removed and sent to     pathology     2) Otherwise normal examination           RECOMMENDATIONS:  1) If the polyp(s) removed today are proven to be adenomatous     (pre-cancerous) polyps, you will need a repeat colonoscopy in 5     years.     2) You will receive a letter within 1-2 weeks with the results     of your biopsy as well as final recommendations. Please call my     office if you have not received a letter after 3 weeks.           ______________________________     Milus Banister, MD           n.     eSIGNED:   Milus Banister at 08/03/2009 08:52 AM           [place],  [place], [id number]  Note: An exclamation mark (!) indicates a result that was not dispersed into the flowsheet. Document Creation Date: 08/03/2009 8:53 AM _______________________________________________________________________  (1) Order result status: Final Collection or observation date-time: 08/03/2009 08:45 Requested date-time:  Receipt date-time:  Reported date-time:  Referring Physician:   Ordering Physician: Owens Loffler [phone]) Specimen Source:  Source: Tawanna Cooler Order Number: [id number] Lab site:   Appended Document: Colonoscopy     Procedures Next Due Date:    Colonoscopy: 07/2014

## 2010-03-23 NOTE — Letter (Signed)
Summary: Diabetic Instructions  [facility]  [place], [place] [address]   Phone: [phone]  Fax: [phone]    KEMONI VLAHAKIS 30-Apr-1931 MRN: [id number]   _  _   ORAL DIABETIC MEDICATION INSTRUCTIONS  The day before your procedure:   Take your diabetic pill as you do normally  The day of your procedure:   Do not take your diabetic pill    We will check your blood sugar levels during the admission process and again in Recovery before discharging you home  ________________________________________________________________________

## 2010-03-23 NOTE — Letter (Signed)
Summary: Results Letter  [facility]  [place], [place] [address]   Phone: [phone]  Fax: [phone]        August 05, 2009 MRN: [id number]    Cindy Wong [address] [place],   [address]    Dear Ms. Cindy Wong,   At least one of the polyps removed during your recent procedure was proven to be adenomatous.  These are pre-cancerous polyps that may have grown into cancers if they had not been removed.  Based on current nationally recognized surveillance guidelines, I recommend that you have a repeat colonoscopy in 5 years.  We will therefore put your information in our reminder system and will contact you in 5 years to schedule a repeat procedure.  Please call if you have any questions or concerns.       Sincerely,  Milus Banister MD  This letter has been electronically signed by your physician.  Appended Document: Results Letter letter mailed.

## 2010-03-23 NOTE — Letter (Signed)
Summary: [facility] Instructions   Gastroenterology  [place], [place] [address]   Phone: [phone]  Fax: [phone]       Cindy Wong    1931/06/09    MRN: [id number]        Procedure Day [name]:  [name]  08/03/09     Arrival Time:  7:30am     Procedure Time: 8:30am     Location of Procedure:                    _X_  [name] (4th Floor)                       [name]   Starting 5 days prior to your procedure  FRIDAY 06/10  do not eat nuts, seeds, popcorn, corn, beans, peas,  salads, or any raw vegetables.  Do not take any fiber supplements (e.g. Metamucil, Citrucel, and Benefiber).  THE DAY BEFORE YOUR PROCEDURE         DATE:  TUESDAY  06/14  1.  Drink clear liquids the entire day-NO SOLID FOOD  2.  Do not drink anything colored red or purple.  Avoid juices with pulp.  No orange juice.  3.  Drink at least 64 oz. (8 glasses) of fluid/clear liquids during the day to prevent dehydration and help the prep work efficiently.  CLEAR LIQUIDS INCLUDE: Water Jello Ice Popsicles Tea (sugar ok, no milk/cream) Powdered fruit flavored drinks Coffee (sugar ok, no milk/cream) Gatorade Juice: apple, white grape, white cranberry  Lemonade Clear bullion, consomm, broth Carbonated beverages (any kind) Strained chicken noodle soup Hard Candy                             4.  In the morning, mix first dose of MoviPrep solution:    Empty 1 Pouch A and 1 Pouch B into the disposable container    Add lukewarm drinking water to the top line of the container. Mix to dissolve    Refrigerate (mixed solution should be used within 24 hrs)  5.  Begin drinking the prep at 5:00 p.m. The MoviPrep container is divided by 4 marks.   Every 15 minutes drink the solution down to the next mark (approximately 8 oz) until the full liter is complete.   6.  Follow completed prep with 16 oz of clear liquid of your choice  (Nothing red or purple).  Continue to drink clear liquids until bedtime.  7.  Before going to bed, mix second dose of MoviPrep solution:    Empty 1 Pouch A and 1 Pouch B into the disposable container    Add lukewarm drinking water to the top line of the container. Mix to dissolve    Refrigerate  THE DAY OF YOUR PROCEDURE      DATE:  [name]  06/15  Beginning at  3:30 a.m. (5 hours before procedure):         1. Every 15 minutes, drink the solution down to the next mark (approx 8 oz) until the full liter is complete.  2. Follow completed prep with 16 oz. of clear liquid of your choice.    3. You may drink clear liquids until 6:30am  (2 HOURS BEFORE PROCEDURE).   MEDICATION INSTRUCTIONS  Unless otherwise instructed, you should take regular prescription medications with a small sip of water   as early as  possible the morning of your procedure.  Diabetic patients - see separate instructions.    Additional medication instructions: Do not take Triamterene/HCTZ morning of procedure.         OTHER INSTRUCTIONS  You will need a responsible adult at least 76 years of age to accompany you and drive you home.   This person must remain in the waiting room during your procedure.  Wear loose fitting clothing that is easily removed.  Leave jewelry and other valuables at home.  However, you may wish to bring a book to read or  an iPod/MP3 player to listen to music as you wait for your procedure to start.  Remove all body piercing jewelry and leave at home.  Total time from sign-in until discharge is approximately 2-3 hours.  You should go home directly after your procedure and rest.  You can resume normal activities the  day after your procedure.  The day of your procedure you should not:   Drive   Make legal decisions   Operate machinery   Drink alcohol   Return to work  You will receive specific instructions about eating, activities and medications before you  leave.    The above instructions have been reviewed and explained to me by   [name] RN  July 27, 2009 9:06 AM     I fully understand and can verbalize these instructions _____________________________ Date _________

## 2010-03-23 NOTE — Letter (Signed)
Summary: New Patient letter  [facility]  [address] [place], [place] [address]   Phone: [phone]  Fax: [phone]       07/12/2009 MRN: [id number]  [address] P[id number] [place], [name]  [address]  Dear Ms. Cindy Wong,  Welcome to the Gastroenterology Division at [facility].    You are scheduled to see Dr.  Ardis Hughs on 07-20-09 at 11:00a.m. on the 3rd floor at [facility], [place] [employer].  We ask that you try to arrive at our office 15 minutes prior to your appointment time to allow for check-in.  We would like you to complete the enclosed self-administered evaluation form prior to your visit and bring it with you on the day of your appointment.  We will review it with you.  Also, please bring a complete list of all your medications or, if you prefer, bring the medication bottles and we will list them.  Please bring your insurance card so that we may make a copy of it.  If your insurance requires a referral to see a specialist, please bring your referral form from your primary care physician.  Co-payments are due at the time of your visit and may be paid by cash, check or credit card.     Your office visit will consist of a consult with your physician (includes a physical exam), any laboratory testing he/she may order, scheduling of any necessary diagnostic testing (e.g. x-ray, ultrasound, CT-scan), and scheduling of a procedure (e.g. Endoscopy, Colonoscopy) if required.  Please allow enough time on your schedule to allow for any/all of these possibilities.    If you cannot keep your appointment, please call [phone] to cancel or reschedule prior to your appointment date.  This allows [place] the opportunity to schedule an appointment for another patient in need of care.  If you do not cancel or reschedule by 5 p.m. the business day prior to your appointment date, you will be charged a $50.00 late cancellation/no-show fee.    Thank you for choosing  [facility] for your medical needs.  We appreciate the opportunity to care for you.  Please visit [place] at our website  to learn more about our practice.                     Sincerely,                                                             The Gastroenterology Division

## 2010-05-07 LAB — GLUCOSE, CAPILLARY
Glucose-Capillary: 160 mg/dL — ABNORMAL HIGH (ref 70–99)
Glucose-Capillary: 165 mg/dL — ABNORMAL HIGH (ref 70–99)

## 2010-06-22 ENCOUNTER — Encounter (HOSPITAL_BASED_OUTPATIENT_CLINIC_OR_DEPARTMENT_OTHER): Payer: Medicare Other | Admitting: Oncology

## 2010-06-22 ENCOUNTER — Other Ambulatory Visit (HOSPITAL_COMMUNITY): Payer: Self-pay | Admitting: Oncology

## 2010-06-22 DIAGNOSIS — C494 Malignant neoplasm of connective and soft tissue of abdomen: Secondary | ICD-10-CM

## 2010-06-22 DIAGNOSIS — Z170 Estrogen receptor positive status [ER+]: Secondary | ICD-10-CM

## 2010-06-22 DIAGNOSIS — D0590 Unspecified type of carcinoma in situ of unspecified breast: Secondary | ICD-10-CM

## 2010-06-22 DIAGNOSIS — C50419 Malignant neoplasm of upper-outer quadrant of unspecified female breast: Secondary | ICD-10-CM

## 2010-06-22 LAB — COMPREHENSIVE METABOLIC PANEL
ALT: 14 U/L (ref 0–35)
AST: 17 U/L (ref 0–37)
Albumin: 4.3 g/dL (ref 3.5–5.2)
Alkaline Phosphatase: 44 U/L (ref 39–117)
BUN: 22 mg/dL (ref 6–23)
CO2: 25 mEq/L (ref 19–32)
Calcium: 9.7 mg/dL (ref 8.4–10.5)
Chloride: 103 mEq/L (ref 96–112)
Creatinine, Ser: 1.13 mg/dL (ref 0.40–1.20)
Glucose, Bld: 156 mg/dL — ABNORMAL HIGH (ref 70–99)
Potassium: 4.1 mEq/L (ref 3.5–5.3)
Sodium: 140 mEq/L (ref 135–145)
Total Bilirubin: 0.5 mg/dL (ref 0.3–1.2)
Total Protein: 7.3 g/dL (ref 6.0–8.3)

## 2010-06-22 LAB — CBC WITH DIFFERENTIAL/PLATELET
BASO%: 1.1 % (ref 0.0–2.0)
Basophils Absolute: 0.1 10*3/uL (ref 0.0–0.1)
EOS%: 1.4 % (ref 0.0–7.0)
Eosinophils Absolute: 0.1 10*3/uL (ref 0.0–0.5)
HCT: 41.6 % (ref 34.8–46.6)
HGB: 13.5 g/dL (ref 11.6–15.9)
LYMPH%: 25.6 % (ref 14.0–49.7)
MCH: 26.8 pg (ref 25.1–34.0)
MCHC: 32.5 g/dL (ref 31.5–36.0)
MCV: 82.6 fL (ref 79.5–101.0)
MONO#: 0.3 10*3/uL (ref 0.1–0.9)
MONO%: 3.9 % (ref 0.0–14.0)
NEUT#: 5.9 10*3/uL (ref 1.5–6.5)
NEUT%: 68 % (ref 38.4–76.8)
Platelets: 344 10*3/uL (ref 145–400)
RBC: 5.03 10*6/uL (ref 3.70–5.45)
RDW: 13.4 % (ref 11.2–14.5)
WBC: 8.7 10*3/uL (ref 3.9–10.3)
lymph#: 2.2 10*3/uL (ref 0.9–3.3)

## 2010-06-22 LAB — LACTATE DEHYDROGENASE: LDH: 125 U/L (ref 94–250)

## 2010-07-04 NOTE — Op Note (Signed)
Cindy Wong, ELLER NO.:  [id number]   MEDICAL RECORD NO.:  [id number]          PATIENT TYPE:  AMB   LOCATION:  ENDO                         FACILITY:  [facility]   PHYSICIAN:  Waverly Ferrari, M.D.    DATE OF BIRTH:  1932-02-11   DATE OF PROCEDURE:  DATE OF DISCHARGE:                               OPERATIVE REPORT   Audio too short to transcribe (less than 5 seconds)           ______________________________  Waverly Ferrari, M.D.     GMO/MEDQ  D:  07/25/2006  T:  07/25/2006  Job:  [id number]

## 2010-07-04 NOTE — Op Note (Signed)
NAMEMYREE, FUCHS NO.:  [id number]   MEDICAL RECORD NO.:  [id number]          PATIENT TYPE:  AMB   LOCATION:  ENDO                         FACILITY:  [facility]   PHYSICIAN:  Waverly Ferrari, M.D.    DATE OF BIRTH:  February 11, 1932   DATE OF PROCEDURE:  07/25/2006  DATE OF DISCHARGE:                               OPERATIVE REPORT   PROCEDURE:  Upper endoscopy.   INDICATIONS:  Follow-up of previous gastric tumor.   ANESTHESIA:  Demerol 75 mg and Versed 7.5 mg.   PROCEDURE:  With the patient mildly sedated in the left lateral  decubitus position, the Pentax videoscopic endoscope was inserted in the  mouth and passed under direct vision through the esophagus which  appeared normal until we reached the distal esophagus where there was a  nonobstructing semicircular stricture of the distal esophagus  photographed only.  We entered into the stomach.  Fundus, body, antrum,  duodenal bulb, second portion duodenum all visualized.  From this point  the endoscope was slowly withdrawn taking circumferential views of  duodenal mucosa until the endoscope had been pulled back in the stomach,  placed in retroflexion to view the stomach from below.  The endoscope  was straightened and withdrawn taking circumferential views of remaining  gastric and esophageal mucosa.  The patient's vital signs and pulse  oximeter remained stable.  The patient tolerated the procedure well  without apparent complications.   FINDINGS:  Benign nonsymptomatic distal esophageal stricture, otherwise  a very unremarkable examination status post partial gastric resection  for leiomyoma/leiomyosarcoma a number of years ago.   PLAN:  Proceed to colonoscopy.           ______________________________  Waverly Ferrari, M.D.     GMO/MEDQ  D:  07/25/2006  T:  07/25/2006  Job:  [id number]   cc:   [name], M.D.

## 2010-07-04 NOTE — Op Note (Signed)
NAMEKATHEY, SPICER NO.:  [id number]   MEDICAL RECORD NO.:  [id number]          PATIENT TYPE:  AMB   LOCATION:  ENDO                         FACILITY:  [facility]   PHYSICIAN:  Waverly Ferrari, M.D.    DATE OF BIRTH:  [birth year]   DATE OF PROCEDURE:  07/25/2006  DATE OF DISCHARGE:                               OPERATIVE REPORT   PROCEDURE:  Colonoscopy.   INDICATIONS:  Colon cancer screening.   ANESTHESIA:  Demerol 25 mg, Versed 2.5 mg.   PROCEDURE:  With the patient mildly sedated in the left lateral  decubitus position the Pentax videoscopic colonoscope was inserted in  the rectum, passed through a very tight sigmoid colon presumably  secondary to adhesions, but with pressure applied we were able to reach  the cecum as identified by ileocecal valve and base of cecum both of  which were photographed.  In the base of the cecum there was patchy  erythematous changes.  One small area appeared to be a polyp which was  removed but there were other areas that we photographed only.  From this  point the colonoscope was slowly withdrawn, taking circumferential views  of the colonic mucosa, stopping that only in the rectum which appeared  normal on direct and retroflexed view.  The endoscope was straightened  and withdrawn.  The patient's vital signs, pulse oximeter remained  stable.  The patient tolerated procedure well without apparent  complications.   FINDINGS:  Cecal polyp with tight tortuous sigmoid colon, otherwise an  unremarkable exam.   PLAN:  Await biopsy report.  The patient will call me for results and  follow-up with me as needed as an outpatient.           ______________________________  Waverly Ferrari, M.D.     GMO/MEDQ  D:  07/25/2006  T:  07/25/2006  Job:  [id number]   cc:   [name], M.D.

## 2010-07-07 NOTE — Op Note (Signed)
Cindy Wong, SCHROEN NO.:  [id number]   MEDICAL RECORD NO.:  [id number]          PATIENT TYPE:  AMB   LOCATION:  DAY                          [facility]ACILITY:  [facility]   PHYSICIAN:  Georgina Quint, M.D.   DATE OF BIRTH:  12-27-1931   DATE OF PROCEDURE:  08/24/2005  DATE OF DISCHARGE:                                 OPERATIVE REPORT   PRE-AND-POSTOPERATIVE DIAGNOSIS:  Carcinoma of the right breast.   OPERATION:  Right total mastectomy.   SURGEON:  Georgina Quint, M.D.   ANESTHESIA:  General.   BLOOD LOSS:  Minimal.   COMPLICATIONS:  None.   CONDITION:  To PACU good.   PROCEDURE:  After the patient was monitored and asleep and had routine  preparation and draping of the right anterior chest and axilla, I made an  elliptical incision around the nipple and areola incorporating the previous  biopsy scar from several years ago.  I developed skin and subcutaneous flaps  medially to the sternum, cephalad to the clavicle, inferiorly to the rectus  fascia, and laterally to the latissimus dorsi muscle and got good  hemostasis.  I carried the dissection a little bit out into the axillary  fascia.  I deepened my cut through the fascia at all points along the medial  cephalad and caudad areas of dissection.  I then reflected the breast off  chest wall using the cautery, taking some rectus and pectoral fascia with me  to assure complete excision.  As I took the dissection laterally and out  into the axilla, I pulled a little bit of axillary tissue down along with  the axillary tail and removed that as well, but made no attempt to do a  formal axillary lymphadenectomy.  I carefully palpated the area and felt no  enlarged lymph nodes.   I completed the removal of the breast from the chest wall and marked the  axillary tail with a suture for the pathologist.  I then irrigated the  dissected area and removed the irrigant.  I placed 2 suction drains with one  lying  along the  anterior chest wall and one lying laterally in the axillary and  lateral area of dissection.  I secured them to the skin with 2-0 silk.  I  closed the skin with staples.  The drains held good suction.  Hemostasis was  excellent.  Sponge, needle, and instrument counts were correct.      Georgina Quint, M.D.  Electronically Signed     WB/MEDQ  D:  08/24/2005  T:  08/24/2005  Job:  [id number]   cc:   [name], M.D.  Fax: [phone]   Jeanie Cooks, M.D.  Fax: [phone]

## 2010-07-07 NOTE — Procedures (Signed)
[facility]  Patient:    Cindy Wong, Cindy Wong Visit Number: [id number] MRN: [id number]          Service Type: END Location: ENDO Attending Physician:  Jim Desanctis Dictated by:   Jim Desanctis, M.D. Proc. Date: 09/10/01 Admit Date:  09/10/2001 Discharge Date: 09/10/2001   CC:         Jeanie Cooks, M.D.   Procedure Report  PROCEDURE:  Colonoscopy.  INDICATIONS:  Colon cancer screening, history of GI bleed.  ANESTHESIA:  Demerol 40, Versed 2 mg.  DESCRIPTION OF PROCEDURE:  With patient mildly sedated in the left lateral decubitus position, the Olympus videoscopic colonoscope was inserted into the rectum and passed under direct vision through a tortuous sigmoid colon to the cecum, identified by the ileocecal valve and appendiceal orifice, both of which were photographed.  From this point, the colonoscope was slowly withdrawn, taking circumferential views of the entire colonic mucosa, stopping only in the rectum which appeared normal on direct and retroflexed view.  The endoscope was straightened and withdrawn.  The patients vital signs and pulse oximeter remained stable.  The patient tolerated the procedure well without apparent complications.  FINDINGS:  Unremarkable colonoscopic examination to the cecum.  PLAN:  Repeat examination in 5-10 years. Dictated by:   Jim Desanctis, M.D. Attending Physician:  Jim Desanctis DD:  09/10/01 TD:  09/13/01 Job: [id number]

## 2010-07-07 NOTE — Op Note (Signed)
   NAME:  Cindy Wong, Cindy Wong                         ACCOUNT NO.:  [id number]   MEDICAL RECORD NO.:  [id number]                   PATIENT TYPE:  AMB   LOCATION:  ENDO                                 FACILITY:  [facility]   PHYSICIAN:  Waverly Ferrari, M.D.                 DATE OF BIRTH:  04-Jun-1931   DATE OF PROCEDURE:  DATE OF DISCHARGE:                                 OPERATIVE REPORT   PROCEDURE:  Upper endoscopy.   INDICATIONS FOR PROCEDURE:  Follow-up of gastric ulcer.   ANESTHESIA:  Demerol 40, Versed 4 mg.   DESCRIPTION OF PROCEDURE:  With the patient mildly sedated in the left  lateral decubitus position, the Olympus videoscopic endoscope was inserted  in the mouth and passed under direct vision through the esophagus which  appeared normal into the stomach. The fundus, body, and antrum appeared  normal. The duodenal bulb and second portion of the duodenum and appeared  normal. From this point, the endoscope was slowly withdrawn taking  circumferential views of the entire duodenal mucosa until the endoscope was  then pulled back into the stomach, placed in retroflexion to view the  stomach from below and slight incomplete wrap of the GE junction around the  endoscope was noted and photographed. The endoscope was then straightened  and withdrawn taking circumferential views of the remaining gastric and  esophageal mucosa. The patient's vital signs and pulse oximeter remained  stable. The patient tolerated the procedure well without apparent  complications.   FINDINGS:  Incomplete wrap of the GE junction around the endoscope  indicating the possibility for reflux, otherwise, unremarkable exam.   PLAN:  Have the patient followup with me as needed.                                                Waverly Ferrari, M.D.    GMO/MEDQ  D:  11/07/2001  T:  11/07/2001  Job:  [id number]   cc:   Jeanie Cooks, M.D.  [address] [place]  [place] [address]  Fax: [phone]

## 2010-07-07 NOTE — Procedures (Signed)
[facility]  Patient:    Cindy Wong, Cindy Wong Visit Number: [id number] MRN: [id number]          Service Type: END Location: ENDO Attending Physician:  Jim Desanctis Dictated by:   Jim Desanctis, M.D. Proc. Date: 09/10/01 Admit Date:  09/10/2001 Discharge Date: 09/10/2001   CC:         [name], M.D. (cc 2 notes)   Procedure Report  ADDENDUM:  I did two dictations earlier today, one an endoscopy and one a colonoscopy. Please cc each of these to Dr. Osborne Casco as well. Dictated by:   Jim Desanctis, M.D. Attending Physician:  Jim Desanctis DD:  09/10/01 TD:  09/14/01 Job: [id number]

## 2010-07-07 NOTE — Procedures (Signed)
[facility]  Patient:    Cindy Wong, Cindy Wong Visit Number: [id number] MRN: [id number]          Service Type: END Location: ENDO Attending Physician:  Jim Desanctis Dictated by:   Jim Desanctis, M.D. Proc. Date: 09/10/01 Admit Date:  09/10/2001 Discharge Date: 09/10/2001   CC:         Jeanie Cooks, M.D.   Procedure Report  PROCEDURE:  Upper endoscopy.  INDICATIONS:  History of leiomyomata sarcoma.  ANESTHESIA:  Demerol 60, Versed 6 mg.  DESCRIPTION OF PROCEDURE:  With patient mildly sedated in the left lateral decubitus position, the Olympus videoscopic endoscope was inserted into the mouth and passed under direct vision through the esophagus, which appeared normal, into the stomach; fundus, body, antrum were visualized; duodenal bulb, second portion of duodenum all visualized, appeared normal.  From this point, the endoscope was slowly withdrawn, taking circumferential views of the entire duodenal mucosa until the endoscope then pulled back into the stomach and placed in retroflexion to view the stomach from below and this too appeared normal.  The endoscope was then straightened and withdrawn, taking circumferential views of the remaining gastric and esophageal mucosa, stopping in the body of the stomach where small ulcerations with black eschar tops indicating bleeding were seen, photographed, and biopsied.  The endoscope was then withdrawn.  The patients vital signs and pulse oximeter remained stable. The patient tolerated the procedure well without apparent complications.  FINDINGS:  Ulcer of body of stomach seen on pullback of endoscope, biopsied.  PLAN: 1. Await biopsy report. 2. The patient will call me for results and follow up with me as an    outpatient. 3. Proceed to colonoscopy as planned. Dictated by:   Jim Desanctis, M.D. Attending Physician:  Jim Desanctis DD:  09/10/01 TD:  09/13/01 Job: [id number]

## 2011-01-18 ENCOUNTER — Other Ambulatory Visit (HOSPITAL_COMMUNITY): Payer: Self-pay | Admitting: Oncology

## 2011-01-18 DIAGNOSIS — Z1231 Encounter for screening mammogram for malignant neoplasm of breast: Secondary | ICD-10-CM

## 2011-02-03 ENCOUNTER — Emergency Department (HOSPITAL_COMMUNITY): Payer: Medicare Other

## 2011-02-03 ENCOUNTER — Encounter: Payer: Self-pay | Admitting: *Deleted

## 2011-02-03 ENCOUNTER — Ambulatory Visit: Payer: Medicare Other

## 2011-02-03 ENCOUNTER — Inpatient Hospital Stay (HOSPITAL_COMMUNITY)
Admission: EM | Admit: 2011-02-03 | Discharge: 2011-02-12 | DRG: 987 | Disposition: A | Discharge status: Home / Self Care | Payer: Medicare Other | Attending: Internal Medicine | Admitting: Internal Medicine

## 2011-02-03 ENCOUNTER — Other Ambulatory Visit: Payer: Self-pay

## 2011-02-03 DIAGNOSIS — F3289 Other specified depressive episodes: Secondary | ICD-10-CM | POA: Diagnosis present

## 2011-02-03 DIAGNOSIS — Z85028 Personal history of other malignant neoplasm of stomach: Secondary | ICD-10-CM

## 2011-02-03 DIAGNOSIS — Z79899 Other long term (current) drug therapy: Secondary | ICD-10-CM

## 2011-02-03 DIAGNOSIS — Z860100 Personal history of colon polyps, unspecified: Secondary | ICD-10-CM

## 2011-02-03 DIAGNOSIS — Z853 Personal history of malignant neoplasm of breast: Secondary | ICD-10-CM

## 2011-02-03 DIAGNOSIS — A419 Sepsis, unspecified organism: Secondary | ICD-10-CM

## 2011-02-03 DIAGNOSIS — D649 Anemia, unspecified: Secondary | ICD-10-CM | POA: Diagnosis not present

## 2011-02-03 DIAGNOSIS — R509 Fever, unspecified: Secondary | ICD-10-CM

## 2011-02-03 DIAGNOSIS — R918 Other nonspecific abnormal finding of lung field: Secondary | ICD-10-CM | POA: Diagnosis present

## 2011-02-03 DIAGNOSIS — E46 Unspecified protein-calorie malnutrition: Secondary | ICD-10-CM | POA: Diagnosis not present

## 2011-02-03 DIAGNOSIS — R5381 Other malaise: Secondary | ICD-10-CM | POA: Diagnosis not present

## 2011-02-03 DIAGNOSIS — R6520 Severe sepsis without septic shock: Secondary | ICD-10-CM

## 2011-02-03 DIAGNOSIS — G000 Hemophilus meningitis: Principal | ICD-10-CM | POA: Diagnosis present

## 2011-02-03 DIAGNOSIS — F411 Generalized anxiety disorder: Secondary | ICD-10-CM | POA: Diagnosis present

## 2011-02-03 DIAGNOSIS — G009 Bacterial meningitis, unspecified: Secondary | ICD-10-CM | POA: Diagnosis present

## 2011-02-03 DIAGNOSIS — R569 Unspecified convulsions: Secondary | ICD-10-CM | POA: Diagnosis present

## 2011-02-03 DIAGNOSIS — I10 Essential (primary) hypertension: Secondary | ICD-10-CM | POA: Diagnosis present

## 2011-02-03 DIAGNOSIS — G039 Meningitis, unspecified: Secondary | ICD-10-CM

## 2011-02-03 DIAGNOSIS — H7011 Chronic mastoiditis, right ear: Secondary | ICD-10-CM | POA: Diagnosis present

## 2011-02-03 DIAGNOSIS — M899 Disorder of bone, unspecified: Secondary | ICD-10-CM | POA: Diagnosis present

## 2011-02-03 DIAGNOSIS — Z800 Family history of malignant neoplasm of digestive organs: Secondary | ICD-10-CM

## 2011-02-03 DIAGNOSIS — J9600 Acute respiratory failure, unspecified whether with hypoxia or hypercapnia: Secondary | ICD-10-CM | POA: Diagnosis present

## 2011-02-03 DIAGNOSIS — E876 Hypokalemia: Secondary | ICD-10-CM | POA: Diagnosis not present

## 2011-02-03 DIAGNOSIS — IMO0002 Reserved for concepts with insufficient information to code with codable children: Secondary | ICD-10-CM | POA: Diagnosis present

## 2011-02-03 DIAGNOSIS — G9340 Encephalopathy, unspecified: Secondary | ICD-10-CM | POA: Diagnosis present

## 2011-02-03 DIAGNOSIS — R51 Headache: Secondary | ICD-10-CM

## 2011-02-03 DIAGNOSIS — E785 Hyperlipidemia, unspecified: Secondary | ICD-10-CM | POA: Diagnosis present

## 2011-02-03 DIAGNOSIS — F329 Major depressive disorder, single episode, unspecified: Secondary | ICD-10-CM | POA: Diagnosis present

## 2011-02-03 DIAGNOSIS — R197 Diarrhea, unspecified: Secondary | ICD-10-CM | POA: Diagnosis not present

## 2011-02-03 DIAGNOSIS — R55 Syncope and collapse: Secondary | ICD-10-CM | POA: Diagnosis present

## 2011-02-03 DIAGNOSIS — M949 Disorder of cartilage, unspecified: Secondary | ICD-10-CM | POA: Diagnosis present

## 2011-02-03 DIAGNOSIS — E119 Type 2 diabetes mellitus without complications: Secondary | ICD-10-CM | POA: Diagnosis present

## 2011-02-03 DIAGNOSIS — D72829 Elevated white blood cell count, unspecified: Secondary | ICD-10-CM | POA: Diagnosis present

## 2011-02-03 DIAGNOSIS — H7010 Chronic mastoiditis, unspecified ear: Secondary | ICD-10-CM | POA: Diagnosis present

## 2011-02-03 DIAGNOSIS — E860 Dehydration: Secondary | ICD-10-CM | POA: Diagnosis present

## 2011-02-03 HISTORY — DX: Essential (primary) hypertension: I10

## 2011-02-03 LAB — URINALYSIS, ROUTINE W REFLEX MICROSCOPIC
Bilirubin Urine: NEGATIVE
Glucose, UA: NEGATIVE mg/dL
Hgb urine dipstick: NEGATIVE
Ketones, ur: NEGATIVE mg/dL
Leukocytes, UA: NEGATIVE
Nitrite: NEGATIVE
Protein, ur: NEGATIVE mg/dL
Specific Gravity, Urine: 1.023 (ref 1.005–1.030)
Urobilinogen, UA: 1 mg/dL (ref 0.0–1.0)
pH: 6 (ref 5.0–8.0)

## 2011-02-03 LAB — BLOOD GAS, ARTERIAL
Acid-Base Excess: 2 mmol/L (ref 0.0–2.0)
Bicarbonate: 24.2 mEq/L — ABNORMAL HIGH (ref 20.0–24.0)
Drawn by: 30996
FIO2: 0.4 %
MECHVT: 500 mL
O2 Saturation: 98 %
PEEP: 5 cmH2O
Patient temperature: 98.6
RATE: 14 resp/min
TCO2: 21.5 mmol/L (ref 0–100)
pCO2 arterial: 31 mmHg — ABNORMAL LOW (ref 35.0–45.0)
pH, Arterial: 7.505 — ABNORMAL HIGH (ref 7.350–7.400)
pO2, Arterial: 101 mmHg — ABNORMAL HIGH (ref 80.0–100.0)

## 2011-02-03 LAB — COMPREHENSIVE METABOLIC PANEL
ALT: 13 U/L (ref 0–35)
AST: 21 U/L (ref 0–37)
Albumin: 4 g/dL (ref 3.5–5.2)
Alkaline Phosphatase: 59 U/L (ref 39–117)
BUN: 16 mg/dL (ref 6–23)
CO2: 28 mEq/L (ref 19–32)
Calcium: 10.6 mg/dL — ABNORMAL HIGH (ref 8.4–10.5)
Chloride: 99 mEq/L (ref 96–112)
Creatinine, Ser: 1.02 mg/dL (ref 0.50–1.10)
GFR calc Af Amer: 60 mL/min — ABNORMAL LOW (ref >90)
GFR calc non Af Amer: 52 mL/min — ABNORMAL LOW (ref >90)
Glucose, Bld: 161 mg/dL — ABNORMAL HIGH (ref 70–99)
Potassium: 4.4 mEq/L (ref 3.5–5.1)
Sodium: 137 mEq/L (ref 135–145)
Total Bilirubin: 0.2 mg/dL — ABNORMAL LOW (ref 0.3–1.2)
Total Protein: 7.8 g/dL (ref 6.0–8.3)

## 2011-02-03 LAB — CBC
HCT: 42.7 % (ref 36.0–46.0)
Hemoglobin: 13.9 g/dL (ref 12.0–15.0)
MCH: 26.9 pg (ref 26.0–34.0)
MCHC: 32.6 g/dL (ref 30.0–36.0)
MCV: 82.6 fL (ref 78.0–100.0)
Platelets: 315 10*3/uL (ref 150–400)
RBC: 5.17 MIL/uL — ABNORMAL HIGH (ref 3.87–5.11)
RDW: 13.2 % (ref 11.5–15.5)
WBC: 20 10*3/uL — ABNORMAL HIGH (ref 4.0–10.5)

## 2011-02-03 LAB — CSF CELL COUNT WITH DIFFERENTIAL
Eosinophils, CSF: 0 % (ref 0–1)
Lymphs, CSF: 2 % — ABNORMAL LOW (ref 40–80)
Monocyte-Macrophage-Spinal Fluid: 1 % — ABNORMAL LOW (ref 15–45)
Other Cells, CSF: 0
RBC Count, CSF: 230 /mm3 — ABNORMAL HIGH
Segmented Neutrophils-CSF: 97 % — ABNORMAL HIGH (ref 0–6)
Tube #: 4
WBC, CSF: 575 /mm3 (ref 0–5)

## 2011-02-03 LAB — CSF CULTURE W GRAM STAIN

## 2011-02-03 LAB — DIFFERENTIAL
Basophils Absolute: 0.1 10*3/uL (ref 0.0–0.1)
Basophils Relative: 0 % (ref 0–1)
Eosinophils Absolute: 0 10*3/uL (ref 0.0–0.7)
Eosinophils Relative: 0 % (ref 0–5)
Lymphocytes Relative: 8 % — ABNORMAL LOW (ref 12–46)
Lymphs Abs: 1.5 10*3/uL (ref 0.7–4.0)
Monocytes Absolute: 0.7 10*3/uL (ref 0.1–1.0)
Monocytes Relative: 4 % (ref 3–12)
Neutro Abs: 17.7 10*3/uL — ABNORMAL HIGH (ref 1.7–7.7)
Neutrophils Relative %: 88 % — ABNORMAL HIGH (ref 43–77)

## 2011-02-03 LAB — GLUCOSE, CSF: Glucose, CSF: 67 mg/dL (ref 43–76)

## 2011-02-03 LAB — GLUCOSE, CAPILLARY: Glucose-Capillary: 124 mg/dL — ABNORMAL HIGH (ref 70–99)

## 2011-02-03 LAB — LACTIC ACID, PLASMA: Lactic Acid, Venous: 2.9 mmol/L — ABNORMAL HIGH (ref 0.5–2.2)

## 2011-02-03 LAB — GRAM STAIN

## 2011-02-03 LAB — MRSA PCR SCREENING: MRSA by PCR: NEGATIVE

## 2011-02-03 LAB — PROTEIN, CSF: Total  Protein, CSF: 192 mg/dL — ABNORMAL HIGH (ref 15–45)

## 2011-02-03 LAB — POCT I-STAT TROPONIN I: Troponin i, poc: 0.01 ng/mL (ref 0.00–0.08)

## 2011-02-03 MED ORDER — SUCCINYLCHOLINE CHLORIDE 20 MG/ML IJ SOLN
INTRAMUSCULAR | Status: AC
  Administered 2011-02-03: 16:00:00
  Filled 2011-02-03: qty 5

## 2011-02-03 MED ORDER — ENOXAPARIN SODIUM 40 MG/0.4ML ~~LOC~~ SOLN
40.0000 mg | SUBCUTANEOUS | Status: DC
  Administered 2011-02-03: 40 mg via SUBCUTANEOUS
  Filled 2011-02-03 (×2): qty 0.4

## 2011-02-03 MED ORDER — IBUPROFEN 800 MG PO TABS
800.0000 mg | ORAL_TABLET | Freq: Once | ORAL | Status: AC
  Administered 2011-02-03: 800 mg via ORAL
  Filled 2011-02-03: qty 1

## 2011-02-03 MED ORDER — ROCURONIUM BROMIDE 50 MG/5ML IV SOLN
INTRAVENOUS | Status: AC | PRN
  Administered 2011-02-03: 70 mg via INTRAVENOUS

## 2011-02-03 MED ORDER — INSULIN GLARGINE 100 UNIT/ML ~~LOC~~ SOLN
5.0000 [IU] | Freq: Every day | SUBCUTANEOUS | Status: DC
  Administered 2011-02-04 – 2011-02-11 (×8): 5 [IU] via SUBCUTANEOUS
  Filled 2011-02-03 (×2): qty 3

## 2011-02-03 MED ORDER — SODIUM CHLORIDE 0.9 % IV SOLN
250.0000 mL | INTRAVENOUS | Status: DC | PRN
  Administered 2011-02-04: 250 mL via INTRAVENOUS

## 2011-02-03 MED ORDER — INSULIN ASPART 100 UNIT/ML ~~LOC~~ SOLN
0.0000 [IU] | Freq: Three times a day (TID) | SUBCUTANEOUS | Status: DC
  Administered 2011-02-04 – 2011-02-05 (×5): 3 [IU] via SUBCUTANEOUS
  Administered 2011-02-05 – 2011-02-06 (×3): 2 [IU] via SUBCUTANEOUS
  Administered 2011-02-07: 5 [IU] via SUBCUTANEOUS
  Administered 2011-02-07 (×2): 2 [IU] via SUBCUTANEOUS
  Administered 2011-02-08 (×3): 3 [IU] via SUBCUTANEOUS
  Administered 2011-02-09: 2 [IU] via SUBCUTANEOUS
  Administered 2011-02-09: 3 [IU] via SUBCUTANEOUS
  Administered 2011-02-09: 5 [IU] via SUBCUTANEOUS
  Administered 2011-02-10 (×3): 2 [IU] via SUBCUTANEOUS
  Administered 2011-02-11: 5 [IU] via SUBCUTANEOUS
  Administered 2011-02-12: 2 [IU] via SUBCUTANEOUS
  Filled 2011-02-03: qty 3

## 2011-02-03 MED ORDER — DEXAMETHASONE SODIUM PHOSPHATE 10 MG/ML IJ SOLN
10.0000 mg | Freq: Four times a day (QID) | INTRAMUSCULAR | Status: DC
  Administered 2011-02-04 – 2011-02-05 (×7): 10 mg via INTRAVENOUS
  Filled 2011-02-03 (×2): qty 1
  Filled 2011-02-03 (×2): qty 3
  Filled 2011-02-03: qty 1
  Filled 2011-02-03: qty 3
  Filled 2011-02-03 (×2): qty 1
  Filled 2011-02-03: qty 3
  Filled 2011-02-03 (×4): qty 1

## 2011-02-03 MED ORDER — VANCOMYCIN HCL 10 G IV SOLR
1.0000 g | Freq: Once | INTRAVENOUS | Status: DC
  Filled 2011-02-03: qty 1000

## 2011-02-03 MED ORDER — LIDOCAINE HCL (CARDIAC) 20 MG/ML IV SOLN
INTRAVENOUS | Status: AC
  Filled 2011-02-03: qty 5

## 2011-02-03 MED ORDER — ACETAMINOPHEN 650 MG RE SUPP
650.0000 mg | Freq: Once | RECTAL | Status: AC
  Administered 2011-02-03: 650 mg via RECTAL
  Filled 2011-02-03: qty 1

## 2011-02-03 MED ORDER — SODIUM CHLORIDE 0.9 % IV SOLN
50.0000 ug/h | INTRAVENOUS | Status: DC
  Administered 2011-02-03 – 2011-02-04 (×2): 50 ug/h via INTRAVENOUS
  Filled 2011-02-03 (×2): qty 50

## 2011-02-03 MED ORDER — ACYCLOVIR SODIUM 50 MG/ML IV SOLN
700.0000 mg | Freq: Once | INTRAVENOUS | Status: AC
  Administered 2011-02-03: 700 mg via INTRAVENOUS
  Filled 2011-02-03: qty 14

## 2011-02-03 MED ORDER — ONDANSETRON HCL 4 MG/2ML IJ SOLN: 4.0000 mg | INTRAMUSCULAR | Status: DC | PRN

## 2011-02-03 MED ORDER — SODIUM CHLORIDE 0.9 % IV SOLN
INTRAVENOUS | Status: DC
  Administered 2011-02-03: 150 mL via INTRAVENOUS
  Administered 2011-02-04 – 2011-02-08 (×6): via INTRAVENOUS
  Administered 2011-02-11: 20 mL/h via INTRAVENOUS

## 2011-02-03 MED ORDER — FENTANYL CITRATE 0.05 MG/ML IJ SOLN
INTRAMUSCULAR | Status: AC
  Administered 2011-02-03: 17:00:00
  Filled 2011-02-03: qty 2

## 2011-02-03 MED ORDER — VANCOMYCIN HCL IN DEXTROSE 1-5 GM/200ML-% IV SOLN
1000.0000 mg | Freq: Once | INTRAVENOUS | Status: AC
  Administered 2011-02-03: 1000 mg via INTRAVENOUS
  Filled 2011-02-03: qty 200

## 2011-02-03 MED ORDER — ETOMIDATE 2 MG/ML IV SOLN
INTRAVENOUS | Status: AC
  Filled 2011-02-03: qty 20

## 2011-02-03 MED ORDER — LORAZEPAM 2 MG/ML IJ SOLN: 2.0000 mg | Freq: Once | INTRAMUSCULAR | Status: DC

## 2011-02-03 MED ORDER — LORAZEPAM 2 MG/ML IJ SOLN
INTRAMUSCULAR | Status: AC
  Filled 2011-02-03: qty 1

## 2011-02-03 MED ORDER — ETOMIDATE 2 MG/ML IV SOLN
INTRAVENOUS | Status: AC | PRN
  Administered 2011-02-03: 20 mg via INTRAVENOUS

## 2011-02-03 MED ORDER — PANTOPRAZOLE SODIUM 40 MG PO PACK
40.0000 mg | PACK | Freq: Every day | ORAL | Status: DC
  Administered 2011-02-03 – 2011-02-05 (×3): 40 mg
  Filled 2011-02-03 (×8): qty 20

## 2011-02-03 MED ORDER — VANCOMYCIN HCL 1000 MG IV SOLR
750.0000 mg | Freq: Two times a day (BID) | INTRAVENOUS | Status: DC
  Administered 2011-02-04: 750 mg via INTRAVENOUS
  Filled 2011-02-03 (×3): qty 750

## 2011-02-03 MED ORDER — SODIUM CHLORIDE 0.9 % IV BOLUS (SEPSIS)
750.0000 mL | Freq: Once | INTRAVENOUS | Status: AC
  Administered 2011-02-03 (×2): 750 mL via INTRAVENOUS

## 2011-02-03 MED ORDER — DEXTROSE 5 % IV SOLN
2.0000 g | Freq: Two times a day (BID) | INTRAVENOUS | Status: DC
  Administered 2011-02-04 (×2): 2 g via INTRAVENOUS
  Filled 2011-02-03 (×3): qty 2

## 2011-02-03 MED ORDER — SODIUM CHLORIDE 0.9 % IV SOLN: Freq: Once | INTRAVENOUS | Status: DC

## 2011-02-03 MED ORDER — PROPOFOL 10 MG/ML IV EMUL
INTRAVENOUS | Status: AC
  Administered 2011-02-03: 18:00:00
  Filled 2011-02-03: qty 100

## 2011-02-03 MED ORDER — ROCURONIUM BROMIDE 50 MG/5ML IV SOLN
INTRAVENOUS | Status: AC
  Filled 2011-02-03: qty 2

## 2011-02-03 MED ORDER — LORAZEPAM 2 MG/ML IJ SOLN
INTRAMUSCULAR | Status: AC
  Administered 2011-02-03: 18:00:00
  Filled 2011-02-03: qty 1

## 2011-02-03 MED ORDER — PROPOFOL 10 MG/ML IV EMUL
5.0000 ug/kg/min | INTRAVENOUS | Status: DC
  Administered 2011-02-03: 25 ug/kg/min via INTRAVENOUS
  Administered 2011-02-04 – 2011-02-05 (×2): 10 ug/kg/min via INTRAVENOUS
  Filled 2011-02-03 (×2): qty 100
  Filled 2011-02-03: qty 20

## 2011-02-03 MED ORDER — FENTANYL BOLUS VIA INFUSION
50.0000 ug | Freq: Four times a day (QID) | INTRAVENOUS | Status: DC | PRN
  Administered 2011-02-05: 100 ug via INTRAVENOUS
  Filled 2011-02-03: qty 100

## 2011-02-03 MED ORDER — AMPICILLIN 125 MG/5ML PO SUSR: 2000.0000 mg | Freq: Four times a day (QID) | ORAL | Status: DC

## 2011-02-03 MED ORDER — SUCCINYLCHOLINE CHLORIDE 20 MG/ML IJ SOLN
INTRAMUSCULAR | Status: AC
  Filled 2011-02-03: qty 5

## 2011-02-03 MED ORDER — AMPICILLIN SODIUM 2 G IJ SOLR
2.0000 g | INTRAMUSCULAR | Status: DC
  Administered 2011-02-03 – 2011-02-04 (×4): 2 g via INTRAVENOUS
  Filled 2011-02-03 (×10): qty 2000

## 2011-02-03 MED ORDER — DEXTROSE 5 % IV SOLN
2.0000 g | Freq: Once | INTRAVENOUS | Status: AC
  Administered 2011-02-03: 2 g via INTRAVENOUS
  Filled 2011-02-03: qty 2

## 2011-02-03 NOTE — ED Notes (Signed)
MD Wilson Singer has been called and asked about Critical Care team, states that he has contacted them and that they stated that they will be on their way shortly.

## 2011-02-03 NOTE — ED Provider Notes (Signed)
History     CSN: [id number] Arrival date & time: 02/03/2011  2:49 PM   First MD Initiated Contact with Patient 02/03/11 1507      Chief Complaint  Patient presents with  . Headache    (Consider location/radiation/quality/duration/timing/severity/associated sxs/prior treatment) HPI  I saw patient in triage. She's states last night she started having pain in her Right ear that went into her jaw. Is unsure she has pain in her teeth on chewing because she's had loss of appetite. She's had nausea with vomiting about 4 times. She denies diarrhea but states she feels the need to have diarrhea. She unsure of fever. She denies rhinorrhea that had a scratchy throat yesterday. She denies cough. She states she has had chills. He denies abdominal pain. She relates her blood sugar this morning was 144.  Primary care physician [facility]  Past Medical History  Diagnosis Date  . Hypertension   . Diabetes mellitus      History reviewed. No pertinent past surgical history.  History reviewed. No pertinent family history.  History  Substance Use Topics  . Smoking status: Never Smoker   . Smokeless tobacco: Not on file  . Alcohol Use: No   patient lives alone  OB History    Grav Para Term Preterm Abortions TAB SAB Ect Mult Living                  Review of Systems  All other systems reviewed and are negative.    Allergies  Sulfonamide derivatives  Home Medications   Current Outpatient Rx  Name Route Sig Dispense Refill  . DIAZEPAM 5 MG PO TABS Oral Take 5 mg by mouth 2 (two) times daily.      Marland Kitchen FERROUS SULFATE 325 (65 FE) MG PO TABS Oral Take 325 mg by mouth daily with breakfast.      . GLIPIZIDE ER 10 MG PO TB24 Oral Take 10 mg by mouth daily.      Marland Kitchen LISINOPRIL 20 MG PO TABS Oral Take 20 mg by mouth daily.      Marland Kitchen METFORMIN HCL 500 MG PO TABS Oral Take 500 mg by mouth 2 (two) times daily with a meal.      . POTASSIUM CHLORIDE 10 MEQ PO TBCR Oral Take 10 mEq by mouth daily.       Marland Kitchen PRAVASTATIN SODIUM 40 MG PO TABS Oral Take 40 mg by mouth daily.      . SERTRALINE HCL 50 MG PO TABS Oral Take 50 mg by mouth daily.      . TRIAMTERENE-HCTZ 37.5-25 MG PO CAPS Oral Take 1 capsule by mouth every morning.        BP 228/98  Pulse 97  Temp(Src) 102.4 F (39.1 C) (Core (Comment))  Resp 20  SpO2 98%  Interpretation   Physical Exam  Nursing note and vitals reviewed. Constitutional: She is oriented to person, place, and time. She appears well-developed and well-nourished.  Non-toxic appearance. She does not appear ill. No distress.  HENT:  Head: Normocephalic and atraumatic.  Left Ear: External ear normal.  Nose: Nose normal. No mucosal edema or rhinorrhea.  Mouth/Throat: Oropharynx is clear and moist and mucous membranes are normal. No dental abscesses or uvula swelling.       Left TM is normal as is the ear canal. Patient has slight bulging of the right lower TM with and fluid level with white purulent material behind the eardrum inferiorly. Ear canal is normal. When I inspect her  nares the left side appears normal. The right side has a lot of swelling and bogginess.  Eyes: Conjunctivae and EOM are normal. Pupils are equal, round, and reactive to light.  Neck: Normal range of motion and full passive range of motion without pain. Neck supple.  Cardiovascular: Normal rate, regular rhythm and normal heart sounds.  Exam reveals no gallop and no friction rub.   No murmur heard. Pulmonary/Chest: Effort normal and breath sounds normal. No respiratory distress. She has no wheezes. She has no rhonchi. She has no rales. She exhibits no tenderness and no crepitus.  Abdominal: Soft. Normal appearance and bowel sounds are normal. She exhibits no distension. There is no tenderness. There is no rebound and no guarding.  Musculoskeletal: Normal range of motion. She exhibits no edema and no tenderness.       Moves all extremities well.   Neurological: She is alert and oriented to  person, place, and time. She has normal strength. No cranial nerve deficit.  Skin: Skin is warm, dry and intact. No rash noted. No erythema. No pallor.  Psychiatric: She has a normal mood and affect. Her speech is normal and behavior is normal. Her mood appears not anxious.    ED Course  Procedures (including critical care time)  Patient was alert sitting in a wheelchair. During my exam I had her put her head back to look up her nose patient had a brief syncopal episode lasting a few seconds with some minor twitching. She was immediately alert and awake.  I was not notified when patient appeared to not be doing well and Dr. Wilson Singer took over her care  Results for orders placed during the hospital encounter of 02/03/11  CBC      Component Value Range   WBC 20.0 (*) 4.0 - 10.5 (K/uL)   RBC 5.17 (*) 3.87 - 5.11 (MIL/uL)   Hemoglobin 13.9  12.0 - 15.0 (g/dL)   HCT 42.7  36.0 - 46.0 (%)   MCV 82.6  78.0 - 100.0 (fL)   MCH 26.9  26.0 - 34.0 (pg)   MCHC 32.6  30.0 - 36.0 (g/dL)   RDW 13.2  11.5 - 15.5 (%)   Platelets 315  150 - 400 (K/uL)  DIFFERENTIAL      Component Value Range   Neutrophils Relative 88 (*) 43 - 77 (%)   Neutro Abs 17.7 (*) 1.7 - 7.7 (K/uL)   Lymphocytes Relative 8 (*) 12 - 46 (%)   Lymphs Abs 1.5  0.7 - 4.0 (K/uL)   Monocytes Relative 4  3 - 12 (%)   Monocytes Absolute 0.7  0.1 - 1.0 (K/uL)   Eosinophils Relative 0  0 - 5 (%)   Eosinophils Absolute 0.0  0.0 - 0.7 (K/uL)   Basophils Relative 0  0 - 1 (%)   Basophils Absolute 0.1  0.0 - 0.1 (K/uL)  COMPREHENSIVE METABOLIC PANEL      Component Value Range   Sodium 137  135 - 145 (mEq/L)   Potassium 4.4  3.5 - 5.1 (mEq/L)   Chloride 99  96 - 112 (mEq/L)   CO2 28  19 - 32 (mEq/L)   Glucose, Bld 161 (*) 70 - 99 (mg/dL)   BUN 16  6 - 23 (mg/dL)   Creatinine, Ser 1.02  0.50 - 1.10 (mg/dL)   Calcium 10.6 (*) 8.4 - 10.5 (mg/dL)   Total Protein 7.8  6.0 - 8.3 (g/dL)   Albumin 4.0  3.5 - 5.2 (g/dL)  AST 21  0 - 37 (U/L)    ALT 13  0 - 35 (U/L)   Alkaline Phosphatase 59  39 - 117 (U/L)   Total Bilirubin 0.2 (*) 0.3 - 1.2 (mg/dL)   GFR calc non Af Amer 52 (*) >90 (mL/min)   GFR calc Af Amer 60 (*) >90 (mL/min)  POCT I-STAT TROPONIN I      Component Value Range   Troponin i, poc 0.01  0.00 - 0.08 (ng/mL)   Comment 3           GLUCOSE, CAPILLARY      Component Value Range   Glucose-Capillary 124 (*) 70 - 99 (mg/dL)   Laboratory interpretation leukocytosis, mild hyper glycemia otherwise no acute changes   Date: 02/03/2011  Rate: 116  Rhythm: sinus tachycardia  QRS Axis: normal  Intervals: normal  ST/T Wave abnormalities: normal  Conduction Disutrbances:none  Narrative Interpretation: LAE  Old EKG Reviewed: changes noted from 08/23/2005 rate was 70   Initial impression Right Otitis Media Fever Vomiting with dehydration Syncope  Plan per Dr Wilson Singer   MDM          Janice Norrie, MD 02/03/11 [phone]

## 2011-02-03 NOTE — ED Notes (Signed)
Pt's Brother is [place], can receive pt medical info, available at [phone], [phone]

## 2011-02-03 NOTE — ED Notes (Addendum)
Pt in c/o headache since last night, pt states she does not typically get headaches, states pain started as sore throat then progressed to earache, and over night became headache, also with vomiting, pt alert and oriented, answering questions per normal

## 2011-02-03 NOTE — H&P (Signed)
Patient name: Cindy Wong Medical record number: [id number] Date of birth: December 17, 1931 Age: 77 y.o. Gender: female PCP: [name], MD, MD  Date: 02/03/2011 Reason for Consult: AMS Referring Physician: Tomi Bamberger  Brief history This is a 77yo F who presented the ER with HA, had progressive AMS with eventual ETT, and LP suspicious for meningitis.  Lines/tubes ETT 12/15 >>> R subclavian CVL 12/15 >>> OGT 12/15 >>> Foley 12/15 >>>  Culture data/sepsis markers Blood cx 12/15 >>> CSF cx 12/15 >>> CSF HSV PCR 12/15  >>> Sputum cx  12/15 >>>  Antibiotics vanc 12/15 >>> Ceftriaxone 12/15 >>> Ampicillin 12/15 >>> Acyclovir 12/15 >>>  Best practice LMWH PPI  Protocols/consults none  Events/studies none  HPI: This is a 77yo F who presented to the ER (drove herself) with R ear pain that radiated to the jaw. She additionally reported nausea and emesis and chills. Her initial evaluation was significant for fever to ~102, exam consistent with AOM on the L, WBC ~20 with 88% PMN. A non-con CT of the head was unremarkable after returning she was noted t have progressive deterioration in her MS and was electively intubated. LP was performed which was grossly cloudy and so far has shown elevated protein, borderline low glu, and a neutrophil predominant elevation in the cell count. CXR was remarkable for LLL infiltrate.  ROS: unable to assess secondary to sedation  Past Medical History  Diagnosis Date  . Hypertension   . Diabetes mellitus   gastric stromal tumor s/p resection  Past Surgical History: TAH Lumpectomy Cholecystectomy  Social History: per chart review, no EtOH, tobacco, or illicit substances  Family History: unable to assess  Allergies:  Allergies  Allergen Reactions  . Sulfonamide Derivatives     REACTION: hives   Medications:  Prior to Admission medications   Medication Sig Start Date End Date Taking? Authorizing Provider  diazepam (VALIUM) 5 MG tablet  Take 5 mg by mouth 2 (two) times daily.     Yes Historical Provider, MD  ferrous sulfate 325 (65 FE) MG tablet Take 325 mg by mouth daily with breakfast.     Yes Historical Provider, MD  glipiZIDE (GLUCOTROL XL) 10 MG 24 hr tablet Take 10 mg by mouth daily.     Yes Historical Provider, MD  lisinopril (PRINIVIL,ZESTRIL) 20 MG tablet Take 20 mg by mouth daily.     Yes Historical Provider, MD  metFORMIN (GLUCOPHAGE) 500 MG tablet Take 500 mg by mouth 2 (two) times daily with a meal.     Yes Historical Provider, MD  potassium chloride (KLOR-CON) 10 MEQ CR tablet Take 10 mEq by mouth daily.     Yes Historical Provider, MD  pravastatin (PRAVACHOL) 40 MG tablet Take 40 mg by mouth daily.     Yes Historical Provider, MD  sertraline (ZOLOFT) 50 MG tablet Take 50 mg by mouth daily.     Yes Historical Provider, MD  triamterene-hydrochlorothiazide (DYAZIDE) 37.5-25 MG per capsule Take 1 capsule by mouth every morning.     Yes Historical Provider, MD    Temp:  [99.5 F (37.5 C)-103 F (39.4 C)] 103 F (39.4 C) (12/15 1820) Pulse Rate:  [90-97] 90  (12/15 1820) Resp:  [18-22] 18  (12/15 1820) BP: (126-228)/(56-98) 126/56 mmHg (12/15 1820) SpO2:  [98 %-100 %] 100 % (12/15 1820) FiO2 (%):  [40 %] 40 % (12/15 1658)  Vent Mode:  [-] PRVC FiO2 (%):  [40 %] 40 % Set Rate:  [14 bmp] 14 bmp Vt  Set:  [500 mL] 500 mL PEEP:  [5 [place] Pressure:  [11 cmH20] 11 cmH20  gen sedated ENT moist mucous membranes, ETT 23cm @ incisors Neck no masses or JVD, some resistance to neck flexion Lymph no cervical or supraclavicular lymphadenopathy CV RRR, no murmur pulm CTAB without wheeszes or crackles abd soft, non-tender, and non-distended Ext no LE edema or clubbing Skin no rashes   Lab Results  Component Value Date   CREATININE 1.02 02/03/2011   BUN 16 02/03/2011   NA 137 02/03/2011   K 4.4 02/03/2011   CL 99 02/03/2011   CO2 28 02/03/2011   Lab Results  Component Value Date   WBC 20.0*  02/03/2011   HGB 13.9 02/03/2011   HCT 42.7 02/03/2011   MCV 82.6 02/03/2011   PLT 315 02/03/2011   Lab Results  Component Value Date   ALT 13 02/03/2011   AST 21 02/03/2011   ALKPHOS 59 02/03/2011   BILITOT 0.2* 02/03/2011   Assessment and Plan  This is a 77yo F who presented with HA, AMS, fever, and CSF consistent with meningitis.  1. Meningitis -- follow up CSF cxs -- blood cx sent -- current ABX: vanc, ampicillin, ceftriaxone, acyclovir -- aggressive IVF while on acyclovir, can stop if PCR NEG -- dexamethasone 0.15mg /kg q6 x 4 days  2. LLL PNA -- secondary ABX coverage as above -- send sputum cx -- send urine legionella/strep pneumo  3. AMS -- secondary to #1, head CT unrearkable -- mechanical ventilation for airway protection -- cont sedation with propofol -- daily awakenings, SBTs when appropriate, etc.  4. DM -- hold po agents, cover with insulin in setting of acute illness  5. Best Practices -- LMWH -- PPI  FULL CODE (spoke to brother - Cindy Wong)  The patient is critically ill with meningitis on mechanical ventilation and requires high complexity decision making for assessment and support, frequent evaluation and titration of therapies, application of advanced monitoring technologies and extensive interpretation of multiple databases.  Total critical care time excluding procedures: 26min  Kennon Rounds MD, MPH  Cindy Wong 02/03/2011, 7:32 PM

## 2011-02-03 NOTE — Procedures (Signed)
Central Venous Catheter Insertion Procedure Note Cindy Wong [id number] 1931-09-23  Procedure: Insertion of Central Venous Catheter Indications: Drug and/or fluid administration  Procedure Details Consent: Unable to obtain consent because of altered level of consciousness. Time Out: Verified patient identification, verified procedure, site/side was marked, verified correct patient position, special equipment/implants available, medications/allergies/relevent history reviewed, required imaging and test results available.  Performed  Maximum sterile technique was used including antiseptics, cap, gloves, gown, hand hygiene, mask and sheet. Skin prep: Chlorhexidine; local anesthetic administered A antimicrobial bonded/coated triple lumen catheter was placed in the right subclavian vein using the Seldinger technique.  Evaluation Blood flow good Complications: No apparent complications Patient did tolerate procedure well. Chest X-ray ordered to verify placement.  CXR: pending.  Cindy Wong 02/03/2011, 8:05 PM

## 2011-02-03 NOTE — ED Notes (Signed)
Transferred pt to resuscitation B report called to [place], [place]

## 2011-02-03 NOTE — ED Notes (Signed)
Propofol stopped at this time due to patients blood pressure of 95/40. Pt bolused with 115ml fluid- BP at 115/58

## 2011-02-03 NOTE — ED Notes (Signed)
Patient arrived to room from CT- report given from previous RN. Patient given 2 mg of ativan, 20 etomidate, 70 rocuronium, intubated and LP done by MD Kohut. April Whitlow NT assisted. Temp foley inserted by April Whitlow NT. Propofol started at 76mcg/kg/min. Rectal tylenol given as well. Patient being monitored by cardiac monitor at this time.

## 2011-02-03 NOTE — ED Provider Notes (Signed)
History    Called to bedside by nursing because pt unresponsive. Upon entering appeared to be seizing. Assumed care because emergent need for intervention and not aware that Dr Tomi Bamberger had already evaluated pt prior. Pt apparently drove self to ED. Presented with HA and ear ache. Apparently episode of vomiting as well. Ativan given. STAT head CT. Temp retaken and 102. Concern for meningitis and empiric abx ordered.   CSN: [id number] Arrival date & time: 02/03/2011  2:49 PM   First MD Initiated Contact with Patient 02/03/11 1507      Chief Complaint  Patient presents with  . Headache    (Consider location/radiation/quality/duration/timing/severity/associated sxs/prior treatment) HPI  Past Medical History  Diagnosis Date  . Hypertension   . Diabetes mellitus     History reviewed. No pertinent past surgical history.  History reviewed. No pertinent family history.  History  Substance Use Topics  . Smoking status: Never Smoker   . Smokeless tobacco: Not on file  . Alcohol Use: No    OB History    Grav Para Term Preterm Abortions TAB SAB Ect Mult Living                  Review of Systems  Level 5 caveat applies because of emergent medical situation and pt's altered mental status.  Allergies  Sulfonamide derivatives  Home Medications   Current Outpatient Rx  Name Route Sig Dispense Refill  . DIAZEPAM 5 MG PO TABS Oral Take 5 mg by mouth 2 (two) times daily.      Marland Kitchen FERROUS SULFATE 325 (65 FE) MG PO TABS Oral Take 325 mg by mouth daily with breakfast.      . GLIPIZIDE ER 10 MG PO TB24 Oral Take 10 mg by mouth daily.      Marland Kitchen LISINOPRIL 20 MG PO TABS Oral Take 20 mg by mouth daily.      Marland Kitchen METFORMIN HCL 500 MG PO TABS Oral Take 500 mg by mouth 2 (two) times daily with a meal.      . POTASSIUM CHLORIDE 10 MEQ PO TBCR Oral Take 10 mEq by mouth daily.      Marland Kitchen PRAVASTATIN SODIUM 40 MG PO TABS Oral Take 40 mg by mouth daily.      . SERTRALINE HCL 50 MG PO TABS Oral Take 50 mg by  mouth daily.      . TRIAMTERENE-HCTZ 37.5-25 MG PO CAPS Oral Take 1 capsule by mouth every morning.        BP 144/82  Pulse 97  Temp(Src) 99.5 F (37.5 C) (Oral)  Resp 19  SpO2 98%  Physical Exam  Constitutional: She appears well-developed. She appears distressed.  HENT:  Head: Normocephalic.       Otitis externa r ear  Eyes: Pupils are equal, round, and reactive to light. Right eye exhibits no discharge. Left eye exhibits no discharge.  Neck: Neck supple.  Cardiovascular: Regular rhythm and normal heart sounds.        tachycardic  Pulmonary/Chest: Effort normal and breath sounds normal. No respiratory distress. She has no wheezes. She has no rales.  Abdominal: Soft. She exhibits no distension and no mass. There is no tenderness. There is no rebound.  Musculoskeletal: Normal range of motion.  Lymphadenopathy:    She has no cervical adenopathy.  Neurological: She exhibits normal muscle tone.       GCS after seizure abated 13 E4, M5, V4. Pt very agitated and not following commands so could not adequately assess.  Trying to get out of bed repeatedly. Moving all extremities equally. No gross CN deficit noted.   Skin: Skin is warm and dry. No rash noted.  Psychiatric:       Extremely agitated and combative    ED Course  INTUBATION Performed by: Virgel Manifold Authorized by: Virgel Manifold Consent: Verbal consent not obtained. Written consent not obtained. The procedure was performed in an emergent situation. Patient identity confirmed: provided demographic data and arm band Indications: airway protection (pt safety. need to obtain further medical studies.) Intubation method: video-assisted Patient status: paralyzed (RSI) Preoxygenation: BVM Sedatives: lorazepam Paralytic: rocuronium Tube size: 8.0 mm Tube type: cuffed Number of attempts: 1 Cricoid pressure: no Cords visualized: yes Post-procedure assessment: chest rise and CO2 detector Breath sounds: equal and absent over  the epigastrium Cuff inflated: yes ETT to lip: 24 cm Tube secured with: ETT holder Chest x-ray interpreted by me and radiologist. Chest x-ray findings: endotracheal tube in appropriate position Patient tolerance: Patient tolerated the procedure well with no immediate complications.  LUMBAR PUNCTURE Performed by: Virgel Manifold Authorized by: Virgel Manifold Consent: Verbal consent not obtained. Written consent not obtained. The procedure was performed in an emergent situation. Patient identity confirmed: arm band and provided demographic data Indications: evaluation for infection and evaluation for altered mental status Anesthesia: local infiltration Local anesthetic: lidocaine 1% without epinephrine Anesthetic total: 2 ml Patient sedated: yes Sedatives: see MAR for details Vitals: Vital signs were monitored during sedation. Preparation: Patient was prepped and draped in the usual sterile fashion. Lumbar space: L3-L4 interspace Patient's position: left lateral decubitus Needle gauge: 20 Needle type: spinal needle - Quincke tip Needle length: 3.5 in Number of attempts: 3 Fluid appearance: cloudy Tubes of fluid: 4 Total volume: 6 ml Post-procedure: site cleaned Patient tolerance: Patient tolerated the procedure well with no immediate complications.  OG placement Performed by: Virgel Manifold Authorized by: Virgel Manifold Consent: Verbal consent not obtained. Written consent not obtained. The procedure was performed in an emergent situation. Patient identity confirmed: arm band and provided demographic data Local anesthesia used: no Patient sedated: yes Sedatives: see MAR for details Vitals: Vital signs were monitored during sedation. Patient tolerance: Patient tolerated the procedure well with no immediate complications.    CRITICAL CARE Performed by: Virgel Manifold   Total critical care time: 80 minutes  Critical care time was exclusive of separately billable  procedures and treating other patients.  Critical care was necessary to treat or prevent imminent or life-threatening deterioration.  Critical care was time spent personally by me on the following activities: development of treatment plan with patient and/or surrogate as well as nursing, discussions with consultants, evaluation of patient's response to treatment, examination of patient, obtaining history from patient or surrogate, ordering and performing treatments and interventions, ordering and review of laboratory studies, ordering and review of radiographic studies, pulse oximetry and re-evaluation of patient's condition.  (including critical care time)  Labs Reviewed  CBC - Abnormal; Notable for the following:    WBC 20.0 (*)    RBC 5.17 (*)    All other components within normal limits  DIFFERENTIAL - Abnormal; Notable for the following:    Neutrophils Relative 88 (*)    Neutro Abs 17.7 (*)    Lymphocytes Relative 8 (*)    All other components within normal limits  COMPREHENSIVE METABOLIC PANEL - Abnormal; Notable for the following:    Glucose, Bld 161 (*)    Calcium 10.6 (*)    Total Bilirubin 0.2 (*)  GFR calc non Af Amer 52 (*)    GFR calc Af Amer 60 (*)    All other components within normal limits  CSF CELL COUNT WITH DIFFERENTIAL - Abnormal; Notable for the following:    Appearance, CSF HAZY (*)    RBC Count, CSF 230 (*)    WBC, CSF 575 (*)    Segmented Neutrophils-CSF 97 (*)    Lymphs, CSF 2 (*)    Monocyte-Macrophage-Spinal Fluid 1 (*)    All other components within normal limits  PROTEIN, CSF - Abnormal; Notable for the following:    Total  Protein, CSF 192 (*)    All other components within normal limits  GLUCOSE, CAPILLARY - Abnormal; Notable for the following:    Glucose-Capillary 124 (*)    All other components within normal limits  LACTIC ACID, PLASMA - Abnormal; Notable for the following:    Lactic Acid, Venous 2.9 (*)    All other components within  normal limits  BLOOD GAS, ARTERIAL - Abnormal; Notable for the following:    pH, Arterial 7.505 (*)    pCO2 arterial 31.0 (*)    pO2, Arterial 101.0 (*)    Bicarbonate 24.2 (*)    All other components within normal limits  URINALYSIS, ROUTINE W REFLEX MICROSCOPIC  GLUCOSE, CSF  POCT I-STAT TROPONIN I  GRAM STAIN  MRSA PCR SCREENING  I-STAT TROPONIN I  CULTURE, BLOOD (ROUTINE X 2)  CULTURE, BLOOD (ROUTINE X 2)  CSF CULTURE  VDRL, CSF  INFLUENZA PANEL BY PCR  PATHOLOGIST SMEAR REVIEW  LEGIONELLA ANTIGEN, URINE  STREP PNEUMONIAE URINARY ANTIGEN  CULTURE, SPUTUM-ASSESSMENT  CBC  BASIC METABOLIC PANEL  BLOOD GAS, ARTERIAL  MAGNESIUM  PHOSPHORUS  POCT CBG MONITORING   Ct Head Wo Contrast  02/03/2011  *RADIOLOGY REPORT*  Clinical Data: Slurred speech.  Left-sided facial droop.  CT HEAD WITHOUT CONTRAST  Technique:  Contiguous axial images were obtained from the base of the skull through the vertex without contrast.  Comparison: 03/12/2004  Findings: There is diffuse patchy low density throughout the subcortical and periventricular white matter consistent with chronic small vessel ischemic change.  There is prominence of the sulci and ventricles consistent with brain atrophy.  There is no evidence for acute brain infarct, hemorrhage or mass.  The paranasal sinuses are clear.  There is opacification of the right mastoid air cells, similar to previous exam.  The left mastoid air cells are clear.  IMPRESSION:  1.  Small vessel ischemic disease and brain atrophy. 2. Chronic right mastoid air cell opacification.  Original Report Authenticated By: Angelita Ingles, M.D.   Dg Chest Portable 1 View  02/03/2011  *RADIOLOGY REPORT*  Clinical Data: Central line placement  PORTABLE CHEST - 1 VIEW  Comparison: 02/03/2011  Findings: Right subclavian central venous catheter crosses the midline into the left innominate vein.  No pneumothorax.  Endotracheal tube in good position.  NG tube enters the  stomach.  Negative for pneumonia or heart failure.  Mild left lower lobe atelectasis is slightly improved from the prior study.  IMPRESSION: Right subclavian central venous catheter crosses the midline into the left innominate vein.  No pneumothorax.  Original Report Authenticated By: Truett Perna, M.D.   Dg Chest Portable 1 View  02/03/2011  *RADIOLOGY REPORT*  Clinical Data: Intubation  PORTABLE CHEST - 1 VIEW  Comparison: 06/21/2009  Findings: Endotracheal tube is approximately 3 cm above the carina. NG tube in place in the stomach.  Left lower lobe airspace disease  is present and may represent atelectasis or pneumonia.  Right lung is clear.  Negative for heart failure or effusion.  IMPRESSION: Endotracheal tube 3 cm above the carina.  Left lower lobe consolidation.  Original Report Authenticated By: Truett Perna, M.D.    4:04 PM Was called to bedside by nursing because of marked decrease in mental status. Pt appeared to be seizing. Presented with HA and repeat temperature in room was 103. Ativan ordered. Empiric abx for meningitis including acyclovir for possible HSV given seizure. LP once back from CT. Admit.  4:39 PM Pt extremely combative after returning from CT. Intubated for airway protection to to obtain necessary testing.     1. Fever   2. Seizure   3. Headache   4. Bacterial meningitis   5. Severe sepsis       MDM  79yF with meningitis. Gram neg rods on stain. Consider possible local seeding from R ear? Intubated. Rocephin/vanc/acyclovir. Discussed with CCM who assumed care. ICU.        Virgel Manifold, MD 02/04/11 [phone]

## 2011-02-04 DIAGNOSIS — G008 Other bacterial meningitis: Secondary | ICD-10-CM

## 2011-02-04 DIAGNOSIS — N179 Acute kidney failure, unspecified: Secondary | ICD-10-CM

## 2011-02-04 DIAGNOSIS — J9600 Acute respiratory failure, unspecified whether with hypoxia or hypercapnia: Secondary | ICD-10-CM

## 2011-02-04 DIAGNOSIS — G009 Bacterial meningitis, unspecified: Secondary | ICD-10-CM

## 2011-02-04 LAB — BASIC METABOLIC PANEL
BUN: 24 mg/dL — ABNORMAL HIGH (ref 6–23)
BUN: 28 mg/dL — ABNORMAL HIGH (ref 6–23)
CO2: 25 mEq/L (ref 19–32)
CO2: 22 mEq/L (ref 19–32)
Calcium: 8.6 mg/dL (ref 8.4–10.5)
Calcium: 7.6 mg/dL — ABNORMAL LOW (ref 8.4–10.5)
Chloride: 103 mEq/L (ref 96–112)
Chloride: 108 mEq/L (ref 96–112)
Creatinine, Ser: 1.76 mg/dL — ABNORMAL HIGH (ref 0.50–1.10)
Creatinine, Ser: 2.11 mg/dL — ABNORMAL HIGH (ref 0.50–1.10)
GFR calc Af Amer: 31 mL/min — ABNORMAL LOW (ref >90)
GFR calc Af Amer: 25 mL/min — ABNORMAL LOW (ref >90)
GFR calc non Af Amer: 27 mL/min — ABNORMAL LOW (ref >90)
GFR calc non Af Amer: 21 mL/min — ABNORMAL LOW (ref >90)
Glucose, Bld: 245 mg/dL — ABNORMAL HIGH (ref 70–99)
Glucose, Bld: 189 mg/dL — ABNORMAL HIGH (ref 70–99)
Potassium: 3.9 mEq/L (ref 3.5–5.1)
Potassium: 3.6 mEq/L (ref 3.5–5.1)
Sodium: 137 mEq/L (ref 135–145)
Sodium: 141 mEq/L (ref 135–145)

## 2011-02-04 LAB — GLUCOSE, CAPILLARY
Glucose-Capillary: 209 mg/dL — ABNORMAL HIGH (ref 70–99)
Glucose-Capillary: 178 mg/dL — ABNORMAL HIGH (ref 70–99)
Glucose-Capillary: 200 mg/dL — ABNORMAL HIGH (ref 70–99)
Glucose-Capillary: 179 mg/dL — ABNORMAL HIGH (ref 70–99)
Glucose-Capillary: 144 mg/dL — ABNORMAL HIGH (ref 70–99)

## 2011-02-04 LAB — BLOOD GAS, ARTERIAL
Acid-base deficit: 0.1 mmol/L (ref 0.0–2.0)
Bicarbonate: 22.6 mEq/L (ref 20.0–24.0)
Drawn by: 23604
FIO2: 0.4 %
MECHVT: 500 mL
O2 Saturation: 99.9 %
PEEP: 5 cmH2O
Patient temperature: 98.9
RATE: 12 resp/min
TCO2: 20.3 mmol/L (ref 0–100)
pCO2 arterial: 32.4 mmHg — ABNORMAL LOW (ref 35.0–45.0)
pH, Arterial: 7.459 — ABNORMAL HIGH (ref 7.350–7.400)
pO2, Arterial: 174 mmHg — ABNORMAL HIGH (ref 80.0–100.0)

## 2011-02-04 LAB — STREP PNEUMONIAE URINARY ANTIGEN: Strep Pneumo Urinary Antigen: NEGATIVE

## 2011-02-04 LAB — INFLUENZA PANEL BY PCR (TYPE A & B)
H1N1 flu by pcr: NOT DETECTED
Influenza A By PCR: NEGATIVE
Influenza B By PCR: NEGATIVE

## 2011-02-04 LAB — LEGIONELLA ANTIGEN, URINE: Legionella Antigen, Urine: NEGATIVE

## 2011-02-04 LAB — CBC
HCT: 35.1 % — ABNORMAL LOW (ref 36.0–46.0)
Hemoglobin: 11.3 g/dL — ABNORMAL LOW (ref 12.0–15.0)
MCH: 26.3 pg (ref 26.0–34.0)
MCHC: 32.2 g/dL (ref 30.0–36.0)
MCV: 81.8 fL (ref 78.0–100.0)
Platelets: 279 10*3/uL (ref 150–400)
RBC: 4.29 MIL/uL (ref 3.87–5.11)
RDW: 13.6 % (ref 11.5–15.5)
WBC: 22.9 10*3/uL — ABNORMAL HIGH (ref 4.0–10.5)

## 2011-02-04 LAB — PHOSPHORUS: Phosphorus: 3.3 mg/dL (ref 2.3–4.6)

## 2011-02-04 LAB — MAGNESIUM: Magnesium: 1.3 mg/dL — ABNORMAL LOW (ref 1.5–2.5)

## 2011-02-04 LAB — SODIUM, URINE, RANDOM: Sodium, Ur: 19 mEq/L

## 2011-02-04 MED ORDER — SODIUM CHLORIDE 0.9 % IV SOLN
2.0000 g | Freq: Four times a day (QID) | INTRAVENOUS | Status: DC
  Administered 2011-02-04 – 2011-02-05 (×4): 2 g via INTRAVENOUS
  Filled 2011-02-04 (×7): qty 2000

## 2011-02-04 MED ORDER — SODIUM CHLORIDE 0.9 % IV BOLUS (SEPSIS)
1000.0000 mL | Freq: Once | INTRAVENOUS | Status: AC
  Administered 2011-02-04: 1000 mL via INTRAVENOUS

## 2011-02-04 MED ORDER — ACYCLOVIR SODIUM 50 MG/ML IV SOLN
10.0000 mg/kg | Freq: Three times a day (TID) | INTRAVENOUS | Status: DC
  Administered 2011-02-04: 725 mg via INTRAVENOUS
  Filled 2011-02-04 (×4): qty 14.5

## 2011-02-04 MED ORDER — VANCOMYCIN HCL IN DEXTROSE 1-5 GM/200ML-% IV SOLN
1000.0000 mg | INTRAVENOUS | Status: DC
  Administered 2011-02-05: 1000 mg via INTRAVENOUS
  Filled 2011-02-04 (×3): qty 200

## 2011-02-04 MED ORDER — BIOTENE DRY MOUTH MT LIQD
15.0000 mL | Freq: Four times a day (QID) | OROMUCOSAL | Status: DC
  Administered 2011-02-04 – 2011-02-06 (×8): 15 mL via OROMUCOSAL

## 2011-02-04 MED ORDER — ENOXAPARIN SODIUM 30 MG/0.3ML ~~LOC~~ SOLN
30.0000 mg | SUBCUTANEOUS | Status: DC
  Administered 2011-02-04 – 2011-02-05 (×2): 30 mg via SUBCUTANEOUS
  Filled 2011-02-04 (×3): qty 0.3

## 2011-02-04 MED ORDER — CHLORHEXIDINE GLUCONATE 0.12 % MT SOLN
15.0000 mL | Freq: Two times a day (BID) | OROMUCOSAL | Status: DC
  Administered 2011-02-04 – 2011-02-06 (×4): 15 mL via OROMUCOSAL
  Filled 2011-02-04 (×5): qty 15

## 2011-02-04 MED ORDER — DEXTROSE 5 % IV SOLN
1.0000 g | INTRAVENOUS | Status: DC
  Administered 2011-02-04: 1 g via INTRAVENOUS
  Filled 2011-02-04 (×2): qty 1

## 2011-02-04 NOTE — Progress Notes (Signed)
ANTIBIOTIC CONSULT NOTE - INITIAL  Pharmacy Consult for acyclovir, vancomycin, Pharmacy is consulted for antibiotic monitoring  Indication: meningitis   Allergies  Allergen Reactions  . Sulfonamide Derivatives     REACTION: hives    Patient Measurements: Height: 5' 6.93" (170 cm) Weight: 160 lb 4.4 oz (72.7 kg) IBW/kg (Calculated) : 61.44  Adjusted Body Weight:   Vital Signs: Temp: 99.9 F (37.7 C) (12/16 0000) Temp src: Core (Comment) (12/15 1630) BP: 107/50 mmHg (12/16 0000) Pulse Rate: 79  (12/16 0000) Intake/Output from previous day: 12/15 0701 - 12/16 0700 In: 200 [I.V.:150; IV Piggyback:50] Out: 75 [Urine:75] Intake/Output from this shift: Total I/O In: 200 [I.V.:150; IV Piggyback:50] Out: 75 [Urine:75]  Labs:  [facility] 02/03/11 1602  WBC 20.0*  HGB 13.9  PLT 315  LABCREA --  CREATININE 1.02   Estimated Creatinine Clearance: 44.8 ml/min (by C-G formula based on Cr of 1.02). No results found for this basename: VANCOTROUGH:2,VANCOPEAK:2,VANCORANDOM:2,GENTTROUGH:2,GENTPEAK:2,GENTRANDOM:2,TOBRATROUGH:2,TOBRAPEAK:2,TOBRARND:2,AMIKACINPEAK:2,AMIKACINTROU:2,AMIKACIN:2, in the last 72 hours   Microbiology: Recent Results (from the past 720 hour(s))  GRAM STAIN     Status: Normal   Collection Time   02/03/11  7:48 PM      Component Value Range Status Comment   Specimen Description CSF   Final    Special Requests NONE   Final    Gram Stain     Final    Value: WBC PRESENT, PREDOMINANTLY PMN     GRAM NEGATIVE RODS     Gram Stain Report Called to,Read Back By and Verified With: [id number]   Report Status 02/03/2011 FINAL   Final   MRSA PCR SCREENING     Status: Normal   Collection Time   02/03/11 10:18 PM      Component Value Range Status Comment   MRSA by PCR NEGATIVE  NEGATIVE  Final     Medical History: Past Medical History  Diagnosis Date  . Hypertension   . Diabetes mellitus     Medications:  Anti-infectives     Start      Dose/Rate Route Frequency Ordered Stop   02/04/11 0600   vancomycin (VANCOCIN) 750 mg in sodium chloride 0.9 % 150 mL IVPB        750 mg 150 mL/hr over 60 Minutes Intravenous Every 12 hours 02/03/11 2224     02/04/11 0500   cefTRIAXone (ROCEPHIN) 2 g in dextrose 5 % 50 mL IVPB        2 g 100 mL/hr over 30 Minutes Intravenous Every 12 hours 02/03/11 2224     02/04/11 0400   acyclovir (ZOVIRAX) 725 mg in dextrose 5 % 150 mL IVPB        10 mg/kg  72.7 kg 164.5 mL/hr over 60 Minutes Intravenous Every 8 hours 02/04/11 0319     02/04/11 0000   ampicillin (PRINCIPEN) 125 MG/5ML suspension 2,000 mg  Status:  Discontinued        2,000 mg Oral 4 times per day 02/03/11 2046 02/03/11 2109   02/03/11 2200   ampicillin (OMNIPEN) 2 g in sodium chloride 0.9 % 50 mL IVPB        2 g 150 mL/hr over 20 Minutes Intravenous Every 4 hours 02/03/11 2135     02/03/11 1630   vancomycin (VANCOCIN) injection 1 g  Status:  Discontinued        1 g Intravenous  Once 02/03/11 1554 02/03/11 1559   02/03/11 1630   acyclovir (ZOVIRAX) 700 mg in dextrose 5 % 100 mL IVPB  700 mg 114 mL/hr over 60 Minutes Intravenous  Once 02/03/11 1555 02/03/11 2055   02/03/11 1630   vancomycin (VANCOCIN) IVPB 1000 mg/200 mL premix        1,000 mg 200 mL/hr over 60 Minutes Intravenous  Once 02/03/11 1559 02/03/11 1900   02/03/11 1600   cefTRIAXone (ROCEPHIN) 2 g in dextrose 5 % 50 mL IVPB        2 g 100 mL/hr over 30 Minutes Intravenous  Once 02/03/11 1549 02/03/11 1745         Assessment: Patient with r/o meningitis, Pharmacy is consulted for antibiotic monitoring.  Will monitor all anitibiotics.  First dose of antibiotics already given in ED.  Goal of Therapy:  Vancomycin trough level 15-20 mcg/ml Acyclovir based on weight, ampicillin based on renal function, rocephin based on meningitiis dosing  Plan:  Measure antibiotic drug levels at steady state Follow up culture results acyclovir 725mg  iv q8hr, change  vancomycin to 750mg  iv q12hr, continue rocephin 2gm iv q12hr and ampicillin 2gm iv q4hr  Tyler Deis, [place] Alfredo Crowford 02/04/2011,3:20 AM

## 2011-02-04 NOTE — Progress Notes (Signed)
ANTIBIOTIC CONSULT NOTE - INITIAL  Pharmacy Consult for Cefepime Indication: r/o meningitis  Allergies  Allergen Reactions  . Sulfonamide Derivatives     REACTION: hives    Patient Measurements: Height: 5' 6.93" (170 cm) Weight: 160 lb 4.4 oz (72.7 kg) IBW/kg (Calculated) : 61.44   Vital Signs: Temp: 98.6 F (37 C) (12/16 1700) Temp src: Core (Comment) (12/16 0800) BP: 148/61 mmHg (12/16 1700) Pulse Rate: 52  (12/16 1700) Intake/Output from previous day: 12/15 0701 - 12/16 0700 In: 1150 [I.V.:900; IV Piggyback:250] Out: 275 [Urine:275] Intake/Output from this shift: Total I/O In: 1875.9 [I.V.:1775.9; IV Piggyback:100] Out: 340 [Urine:340]  Labs:  [facility] 02/04/11 1459 02/04/11 0445 02/03/11 1602  WBC -- 22.9* 20.0*  HGB -- 11.3* 13.9  PLT -- 279 315  LABCREA -- -- --  CREATININE 2.11* 1.76* 1.02   Estimated Creatinine Clearance: 21.6 ml/min (by C-G formula based on Cr of 2.11). No results found for this basename: VANCOTROUGH:2,VANCOPEAK:2,VANCORANDOM:2,GENTTROUGH:2,GENTPEAK:2,GENTRANDOM:2,TOBRATROUGH:2,TOBRAPEAK:2,TOBRARND:2,AMIKACINPEAK:2,AMIKACINTROU:2,AMIKACIN:2, in the last 72 hours   Microbiology: Recent Results (from the past 720 hour(s))  CSF CULTURE     Status: Normal (Preliminary result)   Collection Time   02/03/11  5:33 PM      Component Value Range Status Comment   Specimen Description CSF   Final    Special Requests NONE   Final    Gram Stain     Final    Value: CYTOSPIN WBC PRESENT, PREDOMINANTLY PMN     GRAM NEGATIVE RODS     Gram Stain Report Called to,Read Back By and Verified With: Gram Stain Report Called to,Read Back By and Verified With: [id number] Performed at [facility]   Culture Culture reincubated for better growth   Final    Report Status PENDING   Incomplete   GRAM STAIN     Status: Normal   Collection Time   02/03/11  7:48 PM      Component Value Range Status Comment   Specimen Description CSF    Final    Special Requests NONE   Final    Gram Stain     Final    Value: WBC PRESENT, PREDOMINANTLY PMN     GRAM NEGATIVE RODS     Gram Stain Report Called to,Read Back By and Verified With: [id number]   Report Status 02/03/2011 FINAL   Final   MRSA PCR SCREENING     Status: Normal   Collection Time   02/03/11 10:18 PM      Component Value Range Status Comment   MRSA by PCR NEGATIVE  NEGATIVE  Final     Medical History: Past Medical History  Diagnosis Date  . Hypertension   . Diabetes mellitus     Medications:  Scheduled:    . sodium chloride   Intravenous Once  . acyclovir  700 mg Intravenous Once  . ampicillin (OMNIPEN) IV  2 g Intravenous Q6H  . antiseptic oral rinse  15 mL Mouth Rinse QID  . chlorhexidine  15 mL Mouth Rinse BID  . dexamethasone  10 mg Intravenous Q6H  . enoxaparin (LOVENOX) injection  30 mg Subcutaneous Q24H  . ibuprofen  800 mg Oral Once  . insulin aspart  0-15 Units Subcutaneous TID WC  . insulin glargine  5 Units Subcutaneous QHS  . pantoprazole sodium  40 mg Per Tube Q1200  . sodium chloride  1,000 mL Intravenous Once  . sodium chloride  750 mL Intravenous Once  . vancomycin  1,000 mg Intravenous Once  . vancomycin  1,000 mg Intravenous Q24H  . DISCONTD: acyclovir  10 mg/kg Intravenous Q8H  . DISCONTD: ampicillin (OMNIPEN) IV  2 g Intravenous Q4H  . DISCONTD: ampicillin  2,000 mg Oral Q6H  . DISCONTD: cefTRIAXone (ROCEPHIN)  IV  2 g Intravenous Q12H  . DISCONTD: enoxaparin  40 mg Subcutaneous Q24H  . DISCONTD: vancomycin  750 mg Intravenous Q12H   Infusions:    . sodium chloride 150 mL/hr at 02/04/11 1328  . fentaNYL infusion INTRAVENOUS 50 mcg/hr (02/04/11 1759)  . propofol 10 mcg/kg/min (02/04/11 0800)   Assessment:  65 YOF currently on D2 of rocephin, vanco, and ampicillin for r/o meningitis received new orders to d/c rocephin and start cefepime.  Scr rising quickly, CrCl currently 21.6 ml/min.   Plan:   Start  cefepime 1gm IV q24 hours  Doses for other antibiotics adjusted this AM  Cindy Wong Thi 02/04/2011,5:58 PM

## 2011-02-04 NOTE — Progress Notes (Signed)
Pt is not on restraint.

## 2011-02-04 NOTE — Progress Notes (Signed)
[facility] Progress Note Patient Name: Cindy Wong DOB: [birth year] MRN: [id number]  Date of Service  02/04/2011   HPI/Events of Note  Falling ur op, rising creat - per RN and sentry alert   eICU Interventions  Fluid bolus 1L challenge (CVP not able to get on the CVL)   Intervention Category Major Interventions: Other:  Zayli Villafuerte 02/04/2011, 6:28 AM

## 2011-02-04 NOTE — Progress Notes (Signed)
ANTIBIOTIC CONSULT NOTE - FOLLOW UP  Pharmacy Consult for Vancomycin/Rocephin/Ampicillin Indication: Meningitis  Allergies  Allergen Reactions  . Sulfonamide Derivatives     REACTION: hives    Patient Measurements: Height: 5' 6.93" (170 cm) Weight: 160 lb 4.4 oz (72.7 kg) IBW/kg (Calculated) : 61.44  Adjusted Body Weight: 65kg  Vital Signs: Temp: 98.1 F (36.7 C) (12/16 1200) Temp src: Core (Comment) (12/16 0800) BP: 142/68 mmHg (12/16 1200) Pulse Rate: 52  (12/16 1200) Intake/Output from previous day: 12/15 0701 - 12/16 0700 In: 1150 [I.V.:900; IV Piggyback:250] Out: 275 [Urine:275] Intake/Output from this shift: Total I/O In: 1237.5 [I.V.:1137.5; IV Piggyback:100] Out: 60 [Urine:60]  Labs:  [facility] 02/04/11 0445 02/03/11 1602  WBC 22.9* 20.0*  HGB 11.3* 13.9  PLT 279 315  LABCREA -- --  CREATININE 1.76* 1.02   Estimated Creatinine Clearance: 25.9 ml/min (by C-G formula based on Cr of 1.76).  30 ml/min normalized  Microbiology: Recent Results (from the past 720 hour(s))  GRAM STAIN     Status: Normal   Collection Time   02/03/11  7:48 PM      Component Value Range Status Comment   Specimen Description CSF   Final    Special Requests NONE   Final    Gram Stain     Final    Value: WBC PRESENT, PREDOMINANTLY PMN     GRAM NEGATIVE RODS     Gram Stain Report Called to,Read Back By and Verified With: [id number]   Report Status 02/03/2011 FINAL   Final   MRSA PCR SCREENING     Status: Normal   Collection Time   02/03/11 10:18 PM      Component Value Range Status Comment   MRSA by PCR NEGATIVE  NEGATIVE  Final     Anti-infectives     Start     Dose/Rate Route Frequency Ordered Stop   02/04/11 0600   vancomycin (VANCOCIN) 750 mg in sodium chloride 0.9 % 150 mL IVPB        750 mg 150 mL/hr over 60 Minutes Intravenous Every 12 hours 02/03/11 2224     02/04/11 0500   cefTRIAXone (ROCEPHIN) 2 g in dextrose 5 % 50 mL IVPB        2 g 100  mL/hr over 30 Minutes Intravenous Every 12 hours 02/03/11 2224     02/04/11 0400   acyclovir (ZOVIRAX) 725 mg in dextrose 5 % 150 mL IVPB  Status:  Discontinued        10 mg/kg  72.7 kg 164.5 mL/hr over 60 Minutes Intravenous Every 8 hours 02/04/11 0319 02/04/11 1155   02/04/11 0000   ampicillin (PRINCIPEN) 125 MG/5ML suspension 2,000 mg  Status:  Discontinued        2,000 mg Oral 4 times per day 02/03/11 2046 02/03/11 2109   02/03/11 2200   ampicillin (OMNIPEN) 2 g in sodium chloride 0.9 % 50 mL IVPB        2 g 150 mL/hr over 20 Minutes Intravenous Every 4 hours 02/03/11 2135     02/03/11 1630   vancomycin (VANCOCIN) injection 1 g  Status:  Discontinued        1 g Intravenous  Once 02/03/11 1554 02/03/11 1559   02/03/11 1630   acyclovir (ZOVIRAX) 700 mg in dextrose 5 % 100 mL IVPB        700 mg 114 mL/hr over 60 Minutes Intravenous  Once 02/03/11 1555 02/03/11 2055   02/03/11 1630   vancomycin (VANCOCIN)  IVPB 1000 mg/200 mL premix        1,000 mg 200 mL/hr over 60 Minutes Intravenous  Once 02/03/11 1559 02/03/11 1900   02/03/11 1600   cefTRIAXone (ROCEPHIN) 2 g in dextrose 5 % 50 mL IVPB        2 g 100 mL/hr over 30 Minutes Intravenous  Once 02/03/11 1549 02/03/11 1745          Assessment:  77 yo F admitted for r/o meningitis on day #2 broad spectrum anti-infectives  Renal function is worsening, therefore will adjust based on CrCl ~30 ml/min  Goal of Therapy:  Vancomycin trough level 15-20 mcg/ml  Plan:   Decrease vancomycin 1gm IV q24h  Decrease ampicillin 2gm IV 2gm IV q6h  Continue Rocephin 2gm IV q12h  Acyclovir discontinued by MD  Peggyann Juba R 02/04/2011,1:58 PM Pager: [phone]

## 2011-02-04 NOTE — Consult Note (Signed)
INFECTIOUS DISEASES CONSULTATION Reason for Consult: meningitis Referring Physician: ICU team  Cindy Wong is a pleasant 77 y.o. female with history of DM, HTN, remote hx of gastric stromal tumor s/p resection in 1997 presents to [facility] ED on evening of 12/15 with complaint of right ear pain radiating to her jaw, with symptoms of chills, sore throat, and rhinorhea. She also has had N X V for 4 days. She drove herself to the ED for evaluation. She was found to be febrile at 102, leukocytosis of 22 with left shift, 88%N, and quickly became obtained with possible seizure activity. Prior to becoming obtunded her physical exam did reveal bulging Right TM with purulent debris posteriorly.  She was given ativan as AED, quickly scanned with NCHCT and underwent LP. Her CSF was cloudy upon collection and thus there was concern for bacterial meningitis. Her CSF profile revealed a neutrophilic predominance with WBC of 575, 97N, RBC 230, elevated TP 192,  glu 67 with serum Glu of 161. The gram stain of her CSF revealed GNR and PMNS. Cultures still pending. She was started on meningitis treatment of vanco, ceftriaxone, ampicillin, and acyclovir plus also received dexamethasone 10mg  Q6hr. She was intubated and managed by ICU team. She remains on FIO2 of 40%, sedated with propofol, not requiring pressors.  Abtx: Ceftriaxone #2 vanco #2 Ampicillin #2 Acyclovir x 1 dose    . sodium chloride   Intravenous Once  . acyclovir  700 mg Intravenous Once  . ampicillin (OMNIPEN) IV  2 g Intravenous Q6H  . antiseptic oral rinse  15 mL Mouth Rinse QID  . chlorhexidine  15 mL Mouth Rinse BID  . dexamethasone  10 mg Intravenous Q6H  . enoxaparin (LOVENOX) injection  30 mg Subcutaneous Q24H  . ibuprofen  800 mg Oral Once  . insulin aspart  0-15 Units Subcutaneous TID WC  . insulin glargine  5 Units Subcutaneous QHS  . pantoprazole sodium  40 mg Per Tube Q1200  . sodium chloride  1,000 mL Intravenous Once  . sodium chloride   750 mL Intravenous Once  . vancomycin  1,000 mg Intravenous Once  . vancomycin  1,000 mg Intravenous Q24H  . DISCONTD: acyclovir  10 mg/kg Intravenous Q8H  . DISCONTD: ampicillin (OMNIPEN) IV  2 g Intravenous Q4H  . DISCONTD: ampicillin  2,000 mg Oral Q6H  . DISCONTD: cefTRIAXone (ROCEPHIN)  IV  2 g Intravenous Q12H  . DISCONTD: enoxaparin  40 mg Subcutaneous Q24H  . DISCONTD: vancomycin  750 mg Intravenous Q12H     Past Medical History  Diagnosis Date  . Hypertension   . Diabetes mellitus   - HLD - osteopenia - gastric stromal tumor s/p resection in 1997, EGD in 2008 still clear - hyperplastic colon polyp resected in 2008 - anxiety - depression - s/p hysterectomy - breast ca s/p breast lumpectomy   Family hx: colon ca, and sister with rectal melanoma  Social History:  reports that she has never smoked. She does not have any smokeless tobacco history on file. She reports that she does not drink alcohol or use illicit drugs. she lives alone, no children. Has brothers who are ministers. She is previously quite independent  Allergies:  Allergies  Allergen Reactions  . Sulfonamide Derivatives     REACTION: hives    ROS unable to obtain since patient is obtunded/sedated  Physical Exam BP 148/61  Pulse 52  Temp(Src) 98.6 F (37 C) (Core (Comment))  Resp 12  Ht 5' 6.93" (1.7 m)  Wt 160 lb 4.4 oz (72.7 kg)  BMI 25.16 kg/m2  SpO2 100%  General Appearance:    Intubated and sedated appears stated age  Head:    Normocephalic, without obvious abnormality, atraumatic  Eyes:    PERRL, subconjunctival edema bilaterally, no scleral icterus  Ears:    Right TM is bulging inferiorly, there is purulent fluid behind the TM, some erythema in EAC. The Left TM has small vesicle anteriorly, clear otherwise behind TM  Nose:   Nares normal, septum midline, mucosa normal, no drainage    or sinus tenderness  Throat:   Lips, mucosa, and tongue dry. ET in place  Neck:   Nuchal rigidity       Lungs:     Clear to auscultation bilaterally, respirations unlabored      Heart:    Regular rate and rhythm, S1 and S2 normal, no murmur, rub   or gallop     Abdomen:     Soft, non-tender, bowel sounds active all four quadrants,    no masses, no organomegaly        Extremities:   Extremities normal, atraumatic, no cyanosis or edema  Pulses:   2+ and symmetric all extremities  Skin:   No rash, but she does have prominent hyperpigmentation of her hair follicels on her legs  Lymph nodes:   Cervical, supraclavicular, and axillary nodes normal  Neurologic:   Spontaneously moves arms. Unable to do full exam while sedated  Labs: CBC    Component Value Date/Time   WBC 22.9* 02/04/2011 0445   WBC 8.7 06/22/2010 0953   RBC 4.29 02/04/2011 0445   RBC 5.03 06/22/2010 0953   HGB 11.3* 02/04/2011 0445   HGB 13.5 06/22/2010 0953   HCT 35.1* 02/04/2011 0445   HCT 41.6 06/22/2010 0953   PLT 279 02/04/2011 0445   PLT 344 06/22/2010 0953   MCV 81.8 02/04/2011 0445   MCV 82.6 06/22/2010 0953   MCH 26.3 02/04/2011 0445   MCH 26.8 06/22/2010 0953   MCHC 32.2 02/04/2011 0445   MCHC 32.5 06/22/2010 0953   RDW 13.6 02/04/2011 0445   RDW 13.4 06/22/2010 0953   LYMPHSABS 1.5 02/03/2011 1602   LYMPHSABS 2.2 06/22/2010 0953   MONOABS 0.7 02/03/2011 1602   MONOABS 0.3 06/22/2010 0953   EOSABS 0.0 02/03/2011 1602   EOSABS 0.1 06/22/2010 0953   BASOSABS 0.1 02/03/2011 1602   BASOSABS 0.1 06/22/2010 0953    CMP     Component Value Date/Time   NA 141 02/04/2011 1459   K 3.6 02/04/2011 1459   CL 108 02/04/2011 1459   CO2 22 02/04/2011 1459   GLUCOSE 189* 02/04/2011 1459   BUN 28* 02/04/2011 1459   CREATININE 2.11* 02/04/2011 1459   CALCIUM 7.6* 02/04/2011 1459   PROT 7.8 02/03/2011 1602   ALBUMIN 4.0 02/03/2011 1602   AST 21 02/03/2011 1602   ALT 13 02/03/2011 1602   ALKPHOS 59 02/03/2011 1602   BILITOT 0.2* 02/03/2011 1602   GFRNONAA 21* 02/04/2011 1459   GFRAA 25* 02/04/2011 1459   Lactic acid 2.9 Urine  strep pneumo negative  Micro: 02/03/11 CSF: gram stain shows GNR, pending 02/03/11 flu - negative 02/03/11 blood - NGTD- pending 02/03/11 respiratory culture- pending NGTD 02/03/11 MRSA PCR screen negative  IMAGING:  Ct Head Wo Contrast12/15/2012  Comparison: 03/12/2004  Findings: There is diffuse patchy low density throughout the subcortical and periventricular white matter consistent with chronic small vessel ischemic change.  There is prominence of the sulci  and ventricles consistent with brain atrophy.  There is no evidence for acute brain infarct, hemorrhage or mass.  The paranasal sinuses are clear.  There is opacification of the right mastoid air cells, similar to previous exam.  The left mastoid air cells are clear.  IMPRESSION:  1.  Small vessel ischemic disease and brain atrophy. 2. Chronic right mastoid air cell opacification.    Dg Chest Portable 1 View12/15/2012   Findings: Endotracheal tube is approximately 3 cm above the carina. NG tube in place in the stomach.  Left lower lobe airspace disease is present and may represent atelectasis or pneumonia.  Right lung is clear.  Negative for heart failure or effusion.  IMPRESSION: Endotracheal tube 3 cm above the carina.  Left lower lobe consolidation.     Assessment/Plan: Acute Otitis Media with secondary bacterial meningitis  1) continue with meningitis treatment with vancomycin, ampicillin, and will change ceftriaxone to cefepime for pseudomonal coverage. Will await culture results in order to narrow her regimen. 2) can continue with steroids for now. 3) I have spoken to Dr. Redmond Baseman, ENT, in order to drain her right ear. He is going to arrange for OR time for Monday. 4) if unable to get drained, may consider MRI to see if mastoiditis vs. Brain abscesses are noted as sequelae. 5) droplet isolation may be stopped once culture results. This does not appear to be meningococcal meningitis 6) continue to pan culture if has recurrent  fevers.   Carlyle Basques 02/04/2011, 5:54 PM  [phone]

## 2011-02-04 NOTE — Progress Notes (Signed)
Patient name: Cindy Wong Medical record number: [id number] Date of birth: 1931/06/23 Age: 77 y.o. Gender: female PCP: [name], MD, MD  Date: 02/04/2011 Reason for Consult: AMS Referring Physician: Tomi Bamberger  Brief history This is a 77yo F who presented the ER with HA,Rt ear pain radiating to jaw,  had progressive AMS with eventual intubation, and CSF showing Gram neg meningitis.  PMH - DM-2 , gastric stromal tumor s/p resection  Lines/tubes ETT 12/15 >>> R subclavian CVL 12/15 >>> OGT 12/15 >>> Foley 12/15 >>>  Culture data/sepsis markers Blood cx 12/15 >>> CSF cx 12/15 >>> CSF HSV PCR 12/15  >>> Sputum cx  12/15 >>>  Antibiotics vanc 12/15 >>> Ceftriaxone 12/15 >>> Ampicillin 12/15 >>> Acyclovir 12/15 >>>  Best practice LMWH PPI  Protocols/consults ID  Events/studies Head CT  12/15 >> rt mastoiditis   Allergies  Allergen Reactions  . Sulfonamide Derivatives     REACTION: hives      Temp:  [99.5 F (37.5 C)-103 F (39.4 C)] 103 F (39.4 C) (12/15 1820) Pulse Rate:  [90-97] 90  (12/15 1820) Resp:  [18-22] 18  (12/15 1820) BP: (126-228)/(56-98) 126/56 mmHg (12/15 1820) SpO2:  [98 %-100 %] 100 % (12/15 1820) FiO2 (%):  [40 %] 40 % (12/15 1658)  Vent Mode:  [-] PRVC FiO2 (%):  [29.9 %-40.8 %] 30.4 % Set Rate:  [12 bmp] 12 bmp Vt Set:  [500 mL] 500 mL PEEP:  [5 cmH20] 5 cmH20 Plateau Pressure:  [13 cmH20-16 cmH20] 16 cmH20  gen sedated ENT moist mucous membranes, ETT 23cm @ incisors Neck no masses or JVD, some resistance to neck flexion Lymph no cervical or supraclavicular lymphadenopathy CV RRR, no murmur pulm CTAB without wheeszes or crackles abd soft, non-tender, and non-distended Ext no LE edema or clubbing Skin no rashes   Lab Results  Component Value Date   CREATININE 2.11* 02/04/2011   BUN 28* 02/04/2011   NA 141 02/04/2011   K 3.6 02/04/2011   CL 108 02/04/2011   CO2 22 02/04/2011   Lab Results  Component Value Date   WBC 22.9* 02/04/2011   HGB 11.3* 02/04/2011   HCT 35.1* 02/04/2011   MCV 81.8 02/04/2011   PLT 279 02/04/2011   Lab Results  Component Value Date   ALT 13 02/03/2011   AST 21 02/03/2011   ALKPHOS 59 02/03/2011   BILITOT 0.2* 02/03/2011   Assessment and Plan  This is a 77yo F who presented with HA, AMS, fever, and CSF consistent with meningitis.  1. Meningitis -- follow up CSF cxs -- blood cx sent -- current ABX: vanc, ampicillin, ceftriaxone -- dc acyclovir -- dexamethasone 0.15mg /kg q6 x 4 days -Concern for rt mastoid infection - await otoscope for evaln, d/w ID.  2. LLL PNA -- secondary ABX coverage as above -- Await sputum cx,  urine legionella/strep pneumo  3. AMS -- secondary to #1, head CT unrearkable -- mechanical ventilation for airway protection -- cont sedation with propofol -- daily awakenings, SBTs when appropriate  4. DM -- hold po agents, cover with insulin in setting of acute illness  5. Best Practices -- LMWH -- PPI  FULL CODE (spoke to brother - [place])  The patient is critically ill with meningitis on mechanical ventilation and requires high complexity decision making for assessment and support, frequent evaluation and titration of therapies, application of advanced monitoring technologies and extensive interpretation of multiple databases.  Total critical care time excluding procedures: 32 min  Rashidi Loh V. 02/04/2011, 6:53 PM

## 2011-02-04 NOTE — Progress Notes (Signed)
INITIAL ADULT NUTRITION ASSESSMENT Date: 02/04/2011   Time: 12:16 PM   Reason for Assessment: VDRF  ASSESSMENT: Female 77 y.o.  Dx: Meningitis  Hx:  Past Medical History  Diagnosis Date  . Hypertension   . Diabetes mellitus    Related Meds:     . sodium chloride   Intravenous Once  . acetaminophen  650 mg Rectal Once  . acyclovir  700 mg Intravenous Once  . ampicillin (OMNIPEN) IV  2 g Intravenous Q4H  . cefTRIAXone (ROCEPHIN)  IV  2 g Intravenous Once  . cefTRIAXone (ROCEPHIN)  IV  2 g Intravenous Q12H  . dexamethasone  10 mg Intravenous Q6H  . enoxaparin  40 mg Subcutaneous Q24H  . fentaNYL      . ibuprofen  800 mg Oral Once  . insulin aspart  0-15 Units Subcutaneous TID WC  . insulin glargine  5 Units Subcutaneous QHS  . lidocaine (cardiac) 100 mg/68ml      . LORazepam      . pantoprazole sodium  40 mg Per Tube Q1200  . propofol      . sodium chloride  1,000 mL Intravenous Once  . sodium chloride  750 mL Intravenous Once  . succinylcholine      . vancomycin  750 mg Intravenous Q12H  . vancomycin  1,000 mg Intravenous Once  . DISCONTD: acyclovir  10 mg/kg Intravenous Q8H  . DISCONTD: ampicillin  2,000 mg Oral Q6H  . DISCONTD: LORazepam  2 mg Intravenous Once  . DISCONTD: vancomycin  1 g Intravenous Once     Ht: 5' 6.93" (170 cm)  Wt: 160 lb 4.4 oz (72.7 kg)  Ideal Wt: 61.44 kg  % Ideal Wt: 118%  Usual Wt: Unknown % Usual Wt: Unknown  Body mass index is 25.16 kg/(m^2).  Food/Nutrition Related Hx: Per family, patient has been eating poorly for an unspecified amount of time. They note that she "looks smaller," however, unable to determine if weight loss has occurred and the degree of weight loss.   Patient is intubated and sedated on propofol. Propofol providing 119 kcal from fat.   Labs:  CMP     Component Value Date/Time   NA 137 02/04/2011 0445   K 3.9 02/04/2011 0445   CL 103 02/04/2011 0445   CO2 25 02/04/2011 0445   GLUCOSE 245* 02/04/2011  0445   BUN 24* 02/04/2011 0445   CREATININE 1.76* 02/04/2011 0445   CALCIUM 8.6 02/04/2011 0445   PROT 7.8 02/03/2011 1602   ALBUMIN 4.0 02/03/2011 1602   AST 21 02/03/2011 1602   ALT 13 02/03/2011 1602   ALKPHOS 59 02/03/2011 1602   BILITOT 0.2* 02/03/2011 1602   GFRNONAA 27* 02/04/2011 0445   GFRAA 31* 02/04/2011 0445    CBG (last 3)   Basename 02/04/11 1131 02/04/11 1036 02/04/11 0826  GLUCAP 200* 178* 209*    Intake/Output Summary (Last 24 hours) at 02/04/11 1219 Last data filed at 02/04/11 0500  Gross per 24 hour  Intake   1000 ml  Output    275 ml  Net    725 ml   Diet Order:  NPO  Supplements/Tube Feeding: None  IVF:    sodium chloride Last Rate: 150 mL/hr at 02/04/11 0318  fentaNYL infusion INTRAVENOUS Last Rate: 50 mcg/hr (02/04/11 0500)  propofol Last Rate: 10 mcg/kg/min (02/04/11 0558)    Estimated Nutritional Needs:   Kcal:1300-1400 kcal Protein:87-102 g Fluid:2.2 L  NUTRITION DIAGNOSIS: -Inadequate oral intake (NI-2.1).  Status: Ongoing  RELATED TO: inability to eat  AS EVIDENCE BY: NPO status  MONITORING/EVALUATION(Goals): Patient will meet 90-100% of estimated nutrition needs with diet advancement or enteral nutrition.   Monitor: Nutrition status, weight trends, labs  EDUCATION NEEDS: -No education needs identified at this time  INTERVENTION: If unable to extubate, suggest initiate enteral nutrition if appropriate. Recommend Promote at 20 ml/hr. Increase by 10 ml/hr every 4 hours to goal rate of 50 ml/hr with 30 ml ProStat daily. This, plus propofol, will provide 1391 kcal, 90 g protein, 1007 ml free water.     DOCUMENTATION CODES Per approved criteria  -Not Applicable    Larey Seat [facility] 02/04/2011, 12:16 PM

## 2011-02-05 ENCOUNTER — Inpatient Hospital Stay (HOSPITAL_COMMUNITY): Payer: Medicare Other

## 2011-02-05 DIAGNOSIS — G009 Bacterial meningitis, unspecified: Secondary | ICD-10-CM | POA: Diagnosis present

## 2011-02-05 DIAGNOSIS — H7011 Chronic mastoiditis, right ear: Secondary | ICD-10-CM | POA: Diagnosis present

## 2011-02-05 LAB — GLUCOSE, CAPILLARY
Glucose-Capillary: 135 mg/dL — ABNORMAL HIGH (ref 70–99)
Glucose-Capillary: 144 mg/dL — ABNORMAL HIGH (ref 70–99)
Glucose-Capillary: 159 mg/dL — ABNORMAL HIGH (ref 70–99)
Glucose-Capillary: 159 mg/dL — ABNORMAL HIGH (ref 70–99)
Glucose-Capillary: 163 mg/dL — ABNORMAL HIGH (ref 70–99)
Glucose-Capillary: 175 mg/dL — ABNORMAL HIGH (ref 70–99)

## 2011-02-05 LAB — COMPREHENSIVE METABOLIC PANEL
ALT: 16 U/L (ref 0–35)
AST: 22 U/L (ref 0–37)
Albumin: 2.1 g/dL — ABNORMAL LOW (ref 3.5–5.2)
Alkaline Phosphatase: 41 U/L (ref 39–117)
BUN: 32 mg/dL — ABNORMAL HIGH (ref 6–23)
CO2: 21 mEq/L (ref 19–32)
Calcium: 7.3 mg/dL — ABNORMAL LOW (ref 8.4–10.5)
Chloride: 113 mEq/L — ABNORMAL HIGH (ref 96–112)
Creatinine, Ser: 1.87 mg/dL — ABNORMAL HIGH (ref 0.50–1.10)
GFR calc Af Amer: 29 mL/min — ABNORMAL LOW (ref >90)
GFR calc non Af Amer: 25 mL/min — ABNORMAL LOW (ref >90)
Glucose, Bld: 172 mg/dL — ABNORMAL HIGH (ref 70–99)
Potassium: 3.5 mEq/L (ref 3.5–5.1)
Sodium: 144 mEq/L (ref 135–145)
Total Bilirubin: 0.1 mg/dL — ABNORMAL LOW (ref 0.3–1.2)
Total Protein: 5.7 g/dL — ABNORMAL LOW (ref 6.0–8.3)

## 2011-02-05 LAB — CBC
HCT: 31.7 % — ABNORMAL LOW (ref 36.0–46.0)
Hemoglobin: 10 g/dL — ABNORMAL LOW (ref 12.0–15.0)
MCH: 25.8 pg — ABNORMAL LOW (ref 26.0–34.0)
MCHC: 31.5 g/dL (ref 30.0–36.0)
MCV: 81.7 fL (ref 78.0–100.0)
Platelets: 257 10*3/uL (ref 150–400)
RBC: 3.88 MIL/uL (ref 3.87–5.11)
RDW: 13.9 % (ref 11.5–15.5)
WBC: 24.9 10*3/uL — ABNORMAL HIGH (ref 4.0–10.5)

## 2011-02-05 LAB — VDRL, CSF: VDRL Quant, CSF: NONREACTIVE

## 2011-02-05 LAB — PATHOLOGIST SMEAR REVIEW

## 2011-02-05 MED ORDER — VITAMINS A & D EX OINT
TOPICAL_OINTMENT | CUTANEOUS | Status: AC
  Administered 2011-02-05: 19:00:00
  Filled 2011-02-05: qty 5

## 2011-02-05 MED ORDER — DEXTROSE 5 % IV SOLN
2.0000 g | Freq: Two times a day (BID) | INTRAVENOUS | Status: DC
  Administered 2011-02-05 – 2011-02-12 (×14): 2 g via INTRAVENOUS
  Filled 2011-02-05 (×16): qty 2

## 2011-02-05 MED ORDER — CIPROFLOXACIN-DEXAMETHASONE 0.3-0.1 % OT SUSP
4.0000 [drp] | Freq: Two times a day (BID) | OTIC | Status: DC
  Administered 2011-02-05 – 2011-02-11 (×14): 4 [drp] via OTIC
  Filled 2011-02-05: qty 7.5

## 2011-02-05 MED ORDER — BACITRACIN ZINC 500 UNIT/GM EX OINT
TOPICAL_OINTMENT | CUTANEOUS | Status: AC
  Filled 2011-02-05: qty 15

## 2011-02-05 NOTE — Progress Notes (Addendum)
Patient ID: Cindy Wong, female   DOB: [birth year], 77 y.o.   MRN: [id number]  INFECTIOUS DISEASE PROGRESS NOTE    Date of Admission:  02/03/2011   Total days of antibiotics 2        Day 2 vancomycin        Day 2 ampicillin        Day 1 cefepime Principal Problem:  *Meningitis due to bacterium Active Problems:  Chronic mastoiditis of right side      . sodium chloride   Intravenous Once  . ampicillin (OMNIPEN) IV  2 g Intravenous Q6H  . antiseptic oral rinse  15 mL Mouth Rinse QID  . ceFEPime (MAXIPIME) IV  1 g Intravenous Q24H  . chlorhexidine  15 mL Mouth Rinse BID  . ciprofloxacin-dexamethasone  4 drop Right Ear BID  . dexamethasone  10 mg Intravenous Q6H  . enoxaparin (LOVENOX) injection  30 mg Subcutaneous Q24H  . insulin aspart  0-15 Units Subcutaneous TID WC  . insulin glargine  5 Units Subcutaneous QHS  . pantoprazole sodium  40 mg Per Tube Q1200  . vancomycin  1,000 mg Intravenous Q24H  . DISCONTD: cefTRIAXone (ROCEPHIN)  IV  2 g Intravenous Q12H    Subjective: She is alert but is intubated and unable to speak  Objective: Temp:  [98.1 F (36.7 C)-99.3 F (37.4 C)] 98.1 F (36.7 C) (12/17 1300) Pulse Rate:  [52-74] 69  (12/17 1300) Resp:  [9-23] 14  (12/17 1300) BP: (128-181)/(54-100) 139/100 mmHg (12/17 1300) SpO2:  [99 %-100 %] 99 % (12/17 1300) FiO2 (%):  [29.9 %-30.5 %] 30.1 % (12/17 1300) Weight:  [74.9 kg (165 lb 2 oz)] 165 lb 2 oz (74.9 kg) (12/17 0500)  General: She is more alert today. She remains on the ventilator Skin: No rash Lungs: Clear Cor: Regular S1 and S2 no murmurs Abdomen: Soft and nontender   Lab Results Lab Results  Component Value Date   WBC 24.9* 02/05/2011   HGB 10.0* 02/05/2011   HCT 31.7* 02/05/2011   MCV 81.7 02/05/2011   PLT 257 02/05/2011    Lab Results  Component Value Date   CREATININE 1.87* 02/05/2011   BUN 32* 02/05/2011   NA 144 02/05/2011   K 3.5 02/05/2011   CL 113* 02/05/2011   CO2 21 02/05/2011      Lab Results  Component Value Date   ALT 16 02/05/2011   AST 22 02/05/2011   ALKPHOS 41 02/05/2011   BILITOT 0.1* 02/05/2011       Microbiology: Recent Results (from the past 240 hour(s))  CSF CULTURE     Status: Normal (Preliminary result)   Collection Time   02/03/11  5:33 PM      Component Value Range Status Comment   Specimen Description CSF   Final    Special Requests NONE   Final    Gram Stain     Final    Value: CYTOSPIN WBC PRESENT, PREDOMINANTLY PMN     GRAM NEGATIVE RODS     Gram Stain Report Called to,Read Back By and Verified With: Gram Stain Report Called to,Read Back By and Verified With: [email or web address]/[id number] Performed at [facility]   Culture Culture reincubated for better growth   Final    Report Status PENDING   Incomplete   CULTURE, BLOOD (ROUTINE X 2)     Status: Normal (Preliminary result)   Collection Time   02/03/11  7:30 PM      Component Value Range  Status Comment   Specimen Description BLOOD RIGHT HAND   Final    Special Requests BOTTLES DRAWN AEROBIC AND ANAEROBIC 5CC EACH   Final    Setup Time [id number]   Final    Culture     Final    Value:        BLOOD CULTURE RECEIVED NO GROWTH TO DATE CULTURE WILL BE HELD FOR 5 DAYS BEFORE ISSUING A FINAL NEGATIVE REPORT   Report Status PENDING   Incomplete   CULTURE, BLOOD (ROUTINE X 2)     Status: Normal (Preliminary result)   Collection Time   02/03/11  7:39 PM      Component Value Range Status Comment   Specimen Description BLOOD LEFT HAND   Final    Special Requests BOTTLES DRAWN AEROBIC ONLY 5CC   Final    Setup Time [id number]   Final    Culture     Final    Value:        BLOOD CULTURE RECEIVED NO GROWTH TO DATE CULTURE WILL BE HELD FOR 5 DAYS BEFORE ISSUING A FINAL NEGATIVE REPORT   Report Status PENDING   Incomplete   GRAM STAIN     Status: Normal   Collection Time   02/03/11  7:48 PM      Component Value Range Status Comment   Specimen Description CSF   Final    Special  Requests NONE   Final    Gram Stain     Final    Value: WBC PRESENT, PREDOMINANTLY PMN     GRAM NEGATIVE RODS     Gram Stain Report Called to,Read Back By and Verified With: [id number]   Report Status 02/03/2011 FINAL   Final   MRSA PCR SCREENING     Status: Normal   Collection Time   02/03/11 10:18 PM      Component Value Range Status Comment   MRSA by PCR NEGATIVE  NEGATIVE  Final   CULTURE, RESPIRATORY     Status: Normal (Preliminary result)   Collection Time   02/03/11 11:45 PM      Component Value Range Status Comment   Specimen Description SPUTUM   Final    Special Requests NONE   Final    Gram Stain     Final    Value: ABUNDANT WBC PRESENT,BOTH PMN AND MONONUCLEAR     NO SQUAMOUS EPITHELIAL CELLS SEEN     NO ORGANISMS SEEN   Culture NO GROWTH   Final    Report Status PENDING   Incomplete     Studies/Results: Ct Head Wo Contrast  02/03/2011  *RADIOLOGY REPORT*  Clinical Data: Slurred speech.  Left-sided facial droop.  CT HEAD WITHOUT CONTRAST  Technique:  Contiguous axial images were obtained from the base of the skull through the vertex without contrast.  Comparison: 03/12/2004  Findings: There is diffuse patchy low density throughout the subcortical and periventricular white matter consistent with chronic small vessel ischemic change.  There is prominence of the sulci and ventricles consistent with brain atrophy.  There is no evidence for acute brain infarct, hemorrhage or mass.  The paranasal sinuses are clear.  There is opacification of the right mastoid air cells, similar to previous exam.  The left mastoid air cells are clear.  IMPRESSION:  1.  Small vessel ischemic disease and brain atrophy. 2. Chronic right mastoid air cell opacification.  Original Report Authenticated By: Angelita Ingles, M.D.   Dg Chest Port 1 View  02/05/2011  *  RADIOLOGY REPORT*  Clinical Data: Evaluate tube position.  PORTABLE CHEST - 1 VIEW  Comparison: 02/03/2011  Findings:   Endotracheal tube tip is located approximately 2.4 cm proximal to the carina.  An NG tube descends into the abdomen, tip not visualized.  A right subclavian central venous catheter crosses midline with tip projecting over the left brachiocephalic vein. Artifact overlies theright lower lung.  The left lower lung is excluded from the image and a retrocardiac opacity cannot be excluded.  No pneumothorax identified. Visualized mediastinal contours are unchanged.  IMPRESSION: Unchanged support devices.  Right subclavian line again noted to cross midline.  No pneumothorax.  Original Report Authenticated By: Suanne Marker, M.D.   Dg Chest Portable 1 View  02/03/2011  *RADIOLOGY REPORT*  Clinical Data: Central line placement  PORTABLE CHEST - 1 VIEW  Comparison: 02/03/2011  Findings: Right subclavian central venous catheter crosses the midline into the left innominate vein.  No pneumothorax.  Endotracheal tube in good position.  NG tube enters the stomach.  Negative for pneumonia or heart failure.  Mild left lower lobe atelectasis is slightly improved from the prior study.  IMPRESSION: Right subclavian central venous catheter crosses the midline into the left innominate vein.  No pneumothorax.  Original Report Authenticated By: Truett Perna, M.D.   Dg Chest Portable 1 View  02/03/2011  *RADIOLOGY REPORT*  Clinical Data: Intubation  PORTABLE CHEST - 1 VIEW  Comparison: 06/21/2009  Findings: Endotracheal tube is approximately 3 cm above the carina. NG tube in place in the stomach.  Left lower lobe airspace disease is present and may represent atelectasis or pneumonia.  Right lung is clear.  Negative for heart failure or effusion.  IMPRESSION: Endotracheal tube 3 cm above the carina.  Left lower lobe consolidation.  Original Report Authenticated By: Truett Perna, M.D.     Assessment: I have reviewed her spinal fluid Gram stain and culture results it appears that she is growing Haemophilus influenzae. It  appears to be beta lactamase negative. I will change her back to ceftriaxone and narrow her antibiotic regimen. This is a rare pathogen in the age of childhood immunization but certainly it is well known to cause your infection, mastoiditis and meningitis. She does not need any further isolation and no contacts need any antibiotic prophylaxis. She appears to be improving to antibiotic therapy and tympanocentesis.  There are very little data on adjunctive dexamethasone use in adults with Haemophilus meningitis and current guidelines suggest stopping it. I will discontinue it now.  Plan: 1. Narrow antibiotic therapy to IV ceftriaxone. 2. Discontinue dexamethasone.   Michel Bickers, MD [facility] for [place] [phone] pager   [phone] cell 02/05/2011, 2:22 PM

## 2011-02-05 NOTE — Progress Notes (Signed)
UR CHART REVIEWED; B Ayane Delancey RN, BSN, MHA 

## 2011-02-05 NOTE — Procedures (Signed)
Preop Diagnosis: Right acute otomastoiditis with meningitis Postop Diagnosis: Same Procedure: Right myringotomy with tube placement Surgeon: Dr. Melida Quitter Anesthesia: GETA Complications: None Indication:  77 year old female with meningitis thought to potentially be due to otogenic source.  Patient on IV antibiotics and tube placement was recommended.  Details of Procedure:  The patient was identified in the ICU and informed consent was obtained from the patient's brother including a discussion of risks, benefits, and alternatives.  The bed was turned and the patient was positioned.  She was given additional sedation by the nursing staff.  The right ear was inspected under an operating microscope.  A radial incision was made in the anterior inferior quadrant of the tympanic membrane using a myringotomy knife.  Purulent fluid drained and was cultured.  The middle ear was suctioned and a Sheehy collar button tube was placed.  The middle ear was again suctioned.  She was then returned to nursing care in stable condition.  Plan: Ciprodex 4 drops twice daily to right ear.  Continued IV antibiotics.  Recommended MRI to rule out dural sinus thrombosis.

## 2011-02-05 NOTE — Progress Notes (Signed)
Patient name: Cindy Wong Medical record number: [id number] Date of birth: 1931/11/18 Age: 77 y.o. Gender: female PCP: [name], MD, MD  Date: 02/05/2011 Reason for Consult: AMS Referring Physician: Tomi Bamberger  Brief history This is a 77yo F who presented the ER with HA,Rt ear pain radiating to jaw,  had progressive AMS with eventual intubation, and CSF showing Gram neg meningitis.  PMH - DM-2 , gastric stromal tumor s/p resection  Lines/tubes ETT 12/15 >>>12/17 R subclavian CVL 12/15 >>> OGT 12/15 >>>12/17 Foley 12/15 >>> Right myringotomy with tube placement   Culture data/sepsis markers Blood cx 12/15 >>> CSF cx 12/15 : GNR>>> Haemophilus influenza A CSF HSV PCR 12/15  >>> Sputum cx  12/15 >>>  Antibiotics vanc 12/15 >>>12/17 Ceftriaxone 12/15 >>> Ampicillin 12/15 >>> Acyclovir 12/15 >>>12/17 cipro gtts 12/17>>>  Protocols/consults ID  Events/studies Head CT  12/15 >> rt mastoiditis 12/17: Right myringotomy with tube placement  Subjective S/P tube placement today. Looks great from [employer].   Objective Temp:  [98.1 F (36.7 C)-99.3 F (37.4 C)] 98.1 F (36.7 C) (12/17 1300) Pulse Rate:  [52-74] 69  (12/17 1300) Resp:  [9-23] 14  (12/17 1300) BP: (128-181)/(54-100) 139/100 mmHg (12/17 1300) SpO2:  [99 %-100 %] 99 % (12/17 1300) FiO2 (%):  [29.9 %-30.5 %] 30.1 % (12/17 1300) Weight:  [74.9 kg (165 lb 2 oz)] 165 lb 2 oz (74.9 kg) (12/17 0500)   Vent Mode:  [-] PRVC FiO2 (%):  [29.9 %-30.5 %] 30.1 % Set Rate:  [12 bmp] 12 bmp Vt Set:  [500 mL] 500 mL PEEP:  [5 cmH20] 5 cmH20 Plateau Pressure:  [11 cmH20-22 cmH20] 11 cmH20  Gen: sedated ENT moist mucous membranes, ETT 23cm @ incisors Neck no masses or JVD, some resistance to neck flexion Lymph no cervical or supraclavicular lymphadenopathy CV RRR, no murmur pulm CTAB without wheezes or crackles, F/Vt < 80 abd soft, non-tender, and non-distended Ext no LE edema or clubbing Skin no  rashes   Labs  Lab 02/05/11 0535 02/04/11 1459 02/04/11 0445  NA 144 141 137  K 3.5 3.6 3.9  CL 113* 108 103  CO2 21 22 25   BUN 32* 28* 24*  CREATININE 1.87* 2.11* 1.76*  GLUCOSE 172* 189* 245*    Lab 02/05/11 0535 02/04/11 0445 02/03/11 1602  HGB 10.0* 11.3* 13.9  HCT 31.7* 35.1* 42.7  WBC 24.9* 22.9* 20.0*  PLT 257 279 315   PCXR: low basilar volume. ETT good position, CVL crosses baseline.   Assessment and Plan  This is a 77yo F who presented with HA, AMS, fever, and CSF consistent with meningitis.  Meningitis due to rt mastoid infection, now s/p  Right myringotomy with tube placement on 12/17 Plan: -- follow up CSF cxs -- cont current ABX: ampicillin, ceftriaxone  -- cipro gtts in ear per ENT -- dexamethasone 0.15mg /kg q6 x 4 days; stopped by infectious diseases and 02/05/2011 --MRI on 12/18 per ENT recs.   Respiratory failure due to above and enephalopathy: passed SBT after surgery Plan: -extubate -wean O2 -pulm hygiene   LLL PNA Plan: -- secondary ABX coverage as above -- Await sputum cx,  urine legionella/strep pneumo  AMS/encephalopathy: improved.  Plan: -supportive care  DM plan -- hold po agents, cover with insulin in setting of acute illness  Best Practices -- LMWH -- PPI  FULL CODE   BABCOCK,PETE 02/05/2011, 3:05 PM       STAFF NOTE: I, Dr Ann Lions have personally reviewed patient's  available data, including medical history, events of note, physical examination and test results as part of my evaluation. I have discussed with resident/NP and other care providers such as pharmacist, RN and RRT.  In addition,  I personally evaluated patient and elicited key findings of  otitis media secondary to Haemophilus influenza A resulting in gram-negative bacterial meningitis with resultant acute respiratory failure and intubation. She is status post myringotomy with tube placement today. After that we have extubated her and currently she is doing  well or better. Nevertheless she is critically ill overall. Tomorrow we will proceed to get MRI and MRV to rule out central venous thrombosis. Appreciate consult input from infectious diseases and ENT services. Noted that she is off dexamethasone. Need to check with infectious diseases if there is a value for giving Haemophilus influenza vaccine.  Rest per NP/medical resident whose note is outlined above and that I agree with  The patient is critically ill with multiple organ systems failure and requires high complexity decision making for assessment and support, frequent evaluation and titration of therapies, application of advanced monitoring technologies and extensive interpretation of multiple databases.   Critical Care Time devoted to patient care services described in this note is  32  minutes.

## 2011-02-05 NOTE — Progress Notes (Signed)
Patient extubated after being examined by Marni Griffon, NP. Patient is tolerating extubation well. O2 sats remain at 99 on 4L per nasal cannula. Family is in room with patient.

## 2011-02-05 NOTE — Plan of Care (Signed)
Patient reoriented to time and situation throughout the day. She is now alert and aware of her condition, although she need reinforcement. Family has been in several times throughout the day to visit. Personal belongings (5 rings and one bracelet) were placed in the hospital safe. They were removed from the patient due to swelling in the extremities. Documentation has been completed and is in the paper chart.

## 2011-02-05 NOTE — Consult Note (Signed)
Reason for Consult:meningitis, ear infection Referring Physician: Infectious disease  Cindy Wong is an 77 y.o. female.  HPI: 77 year old presented two nights ago with several days of right ear pain, nausea and vomiting.  In ED, became obtunded requiring intubation.  CT scan showed opacified right mastoid and middle ear and LP showed cloudy fluid determined to represent gram negative meningitis.  Has remained intubated and treated with IV antibiotics, now vancomycin and cefepime.  Asked to consult regarding right ear as potential source for meningitis.  Past Medical History  Diagnosis Date  . Hypertension   . Diabetes mellitus     History reviewed. No pertinent past surgical history.  History reviewed. No pertinent family history.  Social History:  reports that she has never smoked. She does not have any smokeless tobacco history on file. She reports that she does not drink alcohol or use illicit drugs.  Allergies:  Allergies  Allergen Reactions  . Sulfonamide Derivatives     REACTION: hives    Medications: I have reviewed the patient's current medications.  Results for orders placed during the hospital encounter of 02/03/11 (from the past 48 hour(s))  GLUCOSE, CAPILLARY     Status: Abnormal   Collection Time   02/03/11  3:37 PM      Component Value Range Comment   Glucose-Capillary 124 (*) 70 - 99 (mg/dL)   CBC     Status: Abnormal   Collection Time   02/03/11  4:02 PM      Component Value Range Comment   WBC 20.0 (*) 4.0 - 10.5 (K/uL)    RBC 5.17 (*) 3.87 - 5.11 (MIL/uL)    Hemoglobin 13.9  12.0 - 15.0 (g/dL)    HCT 42.7  36.0 - 46.0 (%)    MCV 82.6  78.0 - 100.0 (fL)    MCH 26.9  26.0 - 34.0 (pg)    MCHC 32.6  30.0 - 36.0 (g/dL)    RDW 13.2  11.5 - 15.5 (%)    Platelets 315  150 - 400 (K/uL)   DIFFERENTIAL     Status: Abnormal   Collection Time   02/03/11  4:02 PM      Component Value Range Comment   Neutrophils Relative 88 (*) 43 - 77 (%)    Neutro Abs 17.7  (*) 1.7 - 7.7 (K/uL)    Lymphocytes Relative 8 (*) 12 - 46 (%)    Lymphs Abs 1.5  0.7 - 4.0 (K/uL)    Monocytes Relative 4  3 - 12 (%)    Monocytes Absolute 0.7  0.1 - 1.0 (K/uL)    Eosinophils Relative 0  0 - 5 (%)    Eosinophils Absolute 0.0  0.0 - 0.7 (K/uL)    Basophils Relative 0  0 - 1 (%)    Basophils Absolute 0.1  0.0 - 0.1 (K/uL)   COMPREHENSIVE METABOLIC PANEL     Status: Abnormal   Collection Time   02/03/11  4:02 PM      Component Value Range Comment   Sodium 137  135 - 145 (mEq/L)    Potassium 4.4  3.5 - 5.1 (mEq/L)    Chloride 99  96 - 112 (mEq/L)    CO2 28  19 - 32 (mEq/L)    Glucose, Bld 161 (*) 70 - 99 (mg/dL)    BUN 16  6 - 23 (mg/dL)    Creatinine, Ser 1.02  0.50 - 1.10 (mg/dL)    Calcium 10.6 (*) 8.4 -  10.5 (mg/dL)    Total Protein 7.8  6.0 - 8.3 (g/dL)    Albumin 4.0  3.5 - 5.2 (g/dL)    AST 21  0 - 37 (U/L)    ALT 13  0 - 35 (U/L)    Alkaline Phosphatase 59  39 - 117 (U/L)    Total Bilirubin 0.2 (*) 0.3 - 1.2 (mg/dL)    GFR calc non Af Amer 52 (*) >90 (mL/min)    GFR calc Af Amer 60 (*) >90 (mL/min)   POCT I-STAT TROPONIN I     Status: Normal   Collection Time   02/03/11  4:09 PM      Component Value Range Comment   Troponin i, poc 0.01  0.00 - 0.08 (ng/mL)    Comment 3            URINALYSIS, ROUTINE W REFLEX MICROSCOPIC     Status: Normal   Collection Time   02/03/11  4:31 PM      Component Value Range Comment   Color, Urine YELLOW  YELLOW     APPearance CLEAR  CLEAR     Specific Gravity, Urine 1.023  1.005 - 1.030     pH 6.0  5.0 - 8.0     Glucose, UA NEGATIVE  NEGATIVE (mg/dL)    Hgb urine dipstick NEGATIVE  NEGATIVE     Bilirubin Urine NEGATIVE  NEGATIVE     Ketones, ur NEGATIVE  NEGATIVE (mg/dL)    Protein, ur NEGATIVE  NEGATIVE (mg/dL)    Urobilinogen, UA 1.0  0.0 - 1.0 (mg/dL)    Nitrite NEGATIVE  NEGATIVE     Leukocytes, UA NEGATIVE  NEGATIVE  MICROSCOPIC NOT DONE ON URINES WITH NEGATIVE PROTEIN, BLOOD, LEUKOCYTES, NITRITE, OR GLUCOSE  <1000 mg/dL.  LEGIONELLA ANTIGEN, URINE     Status: Normal   Collection Time   02/03/11  4:31 PM      Component Value Range Comment   Specimen Description URINE, CATHETERIZED      Special Requests NONE      Legionella Antigen, Urine Negative for Legionella pneumophilia serogroup 1      Report Status 02/04/2011 FINAL     STREP PNEUMONIAE URINARY ANTIGEN     Status: Normal   Collection Time   02/03/11  4:31 PM      Component Value Range Comment   Strep Pneumo Urinary Antigen NEGATIVE  NEGATIVE    CSF CELL COUNT WITH DIFFERENTIAL     Status: Abnormal   Collection Time   02/03/11  5:33 PM      Component Value Range Comment   Tube # 4      Color, CSF COLORLESS  COLORLESS     Appearance, CSF HAZY (*) CLEAR     Supernatant HAZY      RBC Count, CSF 230 (*) 0 (/cu mm)    WBC, CSF 575 (*) 0 - 5 (/cu mm)    Segmented Neutrophils-CSF 97 (*) 0 - 6 (%)    Lymphs, CSF 2 (*) 40 - 80 (%)    Monocyte-Macrophage-Spinal Fluid 1 (*) 15 - 45 (%)    Eosinophils, CSF 0  0 - 1 (%)    Other Cells, CSF 0     CSF CULTURE     Status: Normal (Preliminary result)   Collection Time   02/03/11  5:33 PM      Component Value Range Comment   Specimen Description CSF      Special Requests NONE  Gram Stain        Value: CYTOSPIN WBC PRESENT, PREDOMINANTLY PMN     GRAM NEGATIVE RODS     Gram Stain Report Called to,Read Back By and Verified With: Gram Stain Report Called to,Read Back By and Verified With: ANDERSONS/[id number]/MURPHYD Performed at [facility]   Culture Culture reincubated for better growth      Report Status PENDING     GLUCOSE, CSF     Status: Normal   Collection Time   02/03/11  5:33 PM      Component Value Range Comment   Glucose, CSF 67  43 - 76 (mg/dL)   PROTEIN, CSF     Status: Abnormal   Collection Time   02/03/11  5:33 PM      Component Value Range Comment   Total  Protein, CSF 192 (*) 15 - 45 (mg/dL)   INFLUENZA PANEL BY PCR     Status: Normal   Collection Time    02/03/11  5:34 PM      Component Value Range Comment   Influenza A By PCR NEGATIVE  NEGATIVE     Influenza B By PCR NEGATIVE  NEGATIVE     H1N1 flu by pcr NOT DETECTED  NOT DETECTED    BLOOD GAS, ARTERIAL     Status: Abnormal   Collection Time   02/03/11  6:57 PM      Component Value Range Comment   FIO2 .40      Delivery systems VENTILATOR      Mode PRESSURE REGULATED VOLUME CONTROL      VT 500      Rate 14      Peep/cpap 5.0      pH, Arterial 7.505 (*) 7.350 - 7.400     pCO2 arterial 31.0 (*) 35.0 - 45.0 (mmHg)    pO2, Arterial 101.0 (*) 80.0 - 100.0 (mmHg)    Bicarbonate 24.2 (*) 20.0 - 24.0 (mEq/L)    TCO2 21.5  0 - 100 (mmol/L)    Acid-Base Excess 2.0  0.0 - 2.0 (mmol/L)    O2 Saturation 98.0      Patient temperature 98.6      Collection site LEFT BRACHIAL      Drawn by [phone]      Sample type ARTERIAL DRAW     CULTURE, BLOOD (ROUTINE X 2)     Status: Normal (Preliminary result)   Collection Time   02/03/11  7:30 PM      Component Value Range Comment   Specimen Description BLOOD RIGHT HAND      Special Requests BOTTLES DRAWN AEROBIC AND ANAEROBIC 5CC EACH      Setup Time [id number]      Culture        Value:        BLOOD CULTURE RECEIVED NO GROWTH TO DATE CULTURE WILL BE HELD FOR 5 DAYS BEFORE ISSUING A FINAL NEGATIVE REPORT   Report Status PENDING     CULTURE, BLOOD (ROUTINE X 2)     Status: Normal (Preliminary result)   Collection Time   02/03/11  7:39 PM      Component Value Range Comment   Specimen Description BLOOD LEFT HAND      Special Requests BOTTLES DRAWN AEROBIC ONLY 5CC      Setup Time [id number]      Culture        Value:        BLOOD CULTURE RECEIVED NO GROWTH TO DATE CULTURE WILL BE  HELD FOR 5 DAYS BEFORE ISSUING A FINAL NEGATIVE REPORT   Report Status PENDING     LACTIC ACID, PLASMA     Status: Abnormal   Collection Time   02/03/11  7:39 PM      Component Value Range Comment   Lactic Acid, Venous 2.9 (*) 0.5 - 2.2 (mmol/L)   GRAM STAIN      Status: Normal   Collection Time   02/03/11  7:48 PM      Component Value Range Comment   Specimen Description CSF      Special Requests NONE      Gram Stain        Value: WBC PRESENT, PREDOMINANTLY PMN     GRAM NEGATIVE RODS     Gram Stain Report Called to,Read Back By and Verified With: ANDERSONS/[id number]/MURPHYD   Report Status 02/03/2011 FINAL     MRSA PCR SCREENING     Status: Normal   Collection Time   02/03/11 10:18 PM      Component Value Range Comment   MRSA by PCR NEGATIVE  NEGATIVE    CULTURE, RESPIRATORY     Status: Normal (Preliminary result)   Collection Time   02/03/11 11:45 PM      Component Value Range Comment   Specimen Description SPUTUM      Special Requests NONE      Gram Stain        Value: ABUNDANT WBC PRESENT,BOTH PMN AND MONONUCLEAR     NO SQUAMOUS EPITHELIAL CELLS SEEN     NO ORGANISMS SEEN   Culture NO GROWTH      Report Status PENDING     CBC     Status: Abnormal   Collection Time   02/04/11  4:45 AM      Component Value Range Comment   WBC 22.9 (*) 4.0 - 10.5 (K/uL)    RBC 4.29  3.87 - 5.11 (MIL/uL)    Hemoglobin 11.3 (*) 12.0 - 15.0 (g/dL) DELTA CHECK NOTED   HCT 35.1 (*) 36.0 - 46.0 (%)    MCV 81.8  78.0 - 100.0 (fL)    MCH 26.3  26.0 - 34.0 (pg)    MCHC 32.2  30.0 - 36.0 (g/dL)    RDW 13.6  11.5 - 15.5 (%)    Platelets 279  150 - 400 (K/uL)   BASIC METABOLIC PANEL     Status: Abnormal   Collection Time   02/04/11  4:45 AM      Component Value Range Comment   Sodium 137  135 - 145 (mEq/L)    Potassium 3.9  3.5 - 5.1 (mEq/L)    Chloride 103  96 - 112 (mEq/L)    CO2 25  19 - 32 (mEq/L)    Glucose, Bld 245 (*) 70 - 99 (mg/dL)    BUN 24 (*) 6 - 23 (mg/dL)    Creatinine, Ser 1.76 (*) 0.50 - 1.10 (mg/dL)    Calcium 8.6  8.4 - 10.5 (mg/dL)    GFR calc non Af Amer 27 (*) >90 (mL/min)    GFR calc Af Amer 31 (*) >90 (mL/min)   MAGNESIUM     Status: Abnormal   Collection Time   02/04/11  4:45 AM      Component Value Range Comment    Magnesium 1.3 (*) 1.5 - 2.5 (mg/dL)   PHOSPHORUS     Status: Normal   Collection Time   02/04/11  4:45 AM      Component Value  Range Comment   Phosphorus 3.3  2.3 - 4.6 (mg/dL)   BLOOD GAS, ARTERIAL     Status: Abnormal   Collection Time   02/04/11  5:00 AM      Component Value Range Comment   FIO2 .40      Delivery systems VENTILATOR      Mode PRESSURE REGULATED VOLUME CONTROL      VT 500      Rate 12      Peep/cpap 5.0      pH, Arterial 7.459 (*) 7.350 - 7.400     pCO2 arterial 32.4 (*) 35.0 - 45.0 (mmHg)    pO2, Arterial 174.0 (*) 80.0 - 100.0 (mmHg)    Bicarbonate 22.6  20.0 - 24.0 (mEq/L)    TCO2 20.3  0 - 100 (mmol/L)    Acid-base deficit 0.1  0.0 - 2.0 (mmol/L)    O2 Saturation 99.9      Patient temperature 98.9      Collection site LEFT BRACHIAL      Drawn by [id number]      Sample type ARTERIAL DRAW      Allens test (pass/fail) PASS  PASS    GLUCOSE, CAPILLARY     Status: Abnormal   Collection Time   02/04/11  8:26 AM      Component Value Range Comment   Glucose-Capillary 209 (*) 70 - 99 (mg/dL)    Comment 1 Documented in Chart      Comment 2 Notify RN     GLUCOSE, CAPILLARY     Status: Abnormal   Collection Time   02/04/11 10:36 AM      Component Value Range Comment   Glucose-Capillary 178 (*) 70 - 99 (mg/dL)    Comment 1 Notify RN     GLUCOSE, CAPILLARY     Status: Abnormal   Collection Time   02/04/11 11:31 AM      Component Value Range Comment   Glucose-Capillary 200 (*) 70 - 99 (mg/dL)    Comment 1 Notify RN      Comment 2 Documented in Chart     BASIC METABOLIC PANEL     Status: Abnormal   Collection Time   02/04/11  2:59 PM      Component Value Range Comment   Sodium 141  135 - 145 (mEq/L)    Potassium 3.6  3.5 - 5.1 (mEq/L)    Chloride 108  96 - 112 (mEq/L)    CO2 22  19 - 32 (mEq/L)    Glucose, Bld 189 (*) 70 - 99 (mg/dL)    BUN 28 (*) 6 - 23 (mg/dL)    Creatinine, Ser 2.11 (*) 0.50 - 1.10 (mg/dL)    Calcium 7.6 (*) 8.4 - 10.5 (mg/dL)    GFR  calc non Af Amer 21 (*) >90 (mL/min)    GFR calc Af Amer 25 (*) >90 (mL/min)   SODIUM, URINE, RANDOM     Status: Normal   Collection Time   02/04/11  3:37 PM      Component Value Range Comment   Sodium, Ur 19     GLUCOSE, CAPILLARY     Status: Abnormal   Collection Time   02/04/11  3:46 PM      Component Value Range Comment   Glucose-Capillary 179 (*) 70 - 99 (mg/dL)   GLUCOSE, CAPILLARY     Status: Abnormal   Collection Time   02/04/11  8:16 PM      Component Value Range Comment  Glucose-Capillary 144 (*) 70 - 99 (mg/dL)    Comment 1 Documented in Chart      Comment 2 Notify RN     GLUCOSE, CAPILLARY     Status: Abnormal   Collection Time   02/05/11 12:27 AM      Component Value Range Comment   Glucose-Capillary 159 (*) 70 - 99 (mg/dL)    Comment 1 Documented in Chart      Comment 2 Notify RN     CBC     Status: Abnormal   Collection Time   02/05/11  5:35 AM      Component Value Range Comment   WBC 24.9 (*) 4.0 - 10.5 (K/uL)    RBC 3.88  3.87 - 5.11 (MIL/uL)    Hemoglobin 10.0 (*) 12.0 - 15.0 (g/dL)    HCT 31.7 (*) 36.0 - 46.0 (%)    MCV 81.7  78.0 - 100.0 (fL)    MCH 25.8 (*) 26.0 - 34.0 (pg)    MCHC 31.5  30.0 - 36.0 (g/dL)    RDW 13.9  11.5 - 15.5 (%)    Platelets 257  150 - 400 (K/uL)   COMPREHENSIVE METABOLIC PANEL     Status: Abnormal   Collection Time   02/05/11  5:35 AM      Component Value Range Comment   Sodium 144  135 - 145 (mEq/L)    Potassium 3.5  3.5 - 5.1 (mEq/L)    Chloride 113 (*) 96 - 112 (mEq/L)    CO2 21  19 - 32 (mEq/L)    Glucose, Bld 172 (*) 70 - 99 (mg/dL)    BUN 32 (*) 6 - 23 (mg/dL)    Creatinine, Ser 1.87 (*) 0.50 - 1.10 (mg/dL)    Calcium 7.3 (*) 8.4 - 10.5 (mg/dL)    Total Protein 5.7 (*) 6.0 - 8.3 (g/dL)    Albumin 2.1 (*) 3.5 - 5.2 (g/dL)    AST 22  0 - 37 (U/L)    ALT 16  0 - 35 (U/L)    Alkaline Phosphatase 41  39 - 117 (U/L)    Total Bilirubin 0.1 (*) 0.3 - 1.2 (mg/dL)    GFR calc non Af Amer 25 (*) >90 (mL/min)    GFR  calc Af Amer 29 (*) >90 (mL/min)   GLUCOSE, CAPILLARY     Status: Abnormal   Collection Time   02/05/11  7:24 AM      Component Value Range Comment   Glucose-Capillary 175 (*) 70 - 99 (mg/dL)    Comment 1 Notify RN      Comment 2 Documented in Chart     GLUCOSE, CAPILLARY     Status: Abnormal   Collection Time   02/05/11 11:35 AM      Component Value Range Comment   Glucose-Capillary 144 (*) 70 - 99 (mg/dL)    Comment 1 Notify RN      Comment 2 Documented in Chart       Ct Head Wo Contrast  02/03/2011  *RADIOLOGY REPORT*  Clinical Data: Slurred speech.  Left-sided facial droop.  CT HEAD WITHOUT CONTRAST  Technique:  Contiguous axial images were obtained from the base of the skull through the vertex without contrast.  Comparison: 03/12/2004  Findings: There is diffuse patchy low density throughout the subcortical and periventricular white matter consistent with chronic small vessel ischemic change.  There is prominence of the sulci and ventricles consistent with brain atrophy.  There is no evidence for  acute brain infarct, hemorrhage or mass.  The paranasal sinuses are clear.  There is opacification of the right mastoid air cells, similar to previous exam.  The left mastoid air cells are clear.  IMPRESSION:  1.  Small vessel ischemic disease and brain atrophy. 2. Chronic right mastoid air cell opacification.  Original Report Authenticated By: Angelita Ingles, M.D.   Dg Chest Port 1 View  02/05/2011  *RADIOLOGY REPORT*  Clinical Data: Evaluate tube position.  PORTABLE CHEST - 1 VIEW  Comparison: 02/03/2011  Findings:  Endotracheal tube tip is located approximately 2.4 cm proximal to the carina.  An NG tube descends into the abdomen, tip not visualized.  A right subclavian central venous catheter crosses midline with tip projecting over the left brachiocephalic vein. Artifact overlies theright lower lung.  The left lower lung is excluded from the image and a retrocardiac opacity cannot be  excluded.  No pneumothorax identified. Visualized mediastinal contours are unchanged.  IMPRESSION: Unchanged support devices.  Right subclavian line again noted to cross midline.  No pneumothorax.  Original Report Authenticated By: Suanne Marker, M.D.   Dg Chest Portable 1 View  02/03/2011  *RADIOLOGY REPORT*  Clinical Data: Central line placement  PORTABLE CHEST - 1 VIEW  Comparison: 02/03/2011  Findings: Right subclavian central venous catheter crosses the midline into the left innominate vein.  No pneumothorax.  Endotracheal tube in good position.  NG tube enters the stomach.  Negative for pneumonia or heart failure.  Mild left lower lobe atelectasis is slightly improved from the prior study.  IMPRESSION: Right subclavian central venous catheter crosses the midline into the left innominate vein.  No pneumothorax.  Original Report Authenticated By: Truett Perna, M.D.   Dg Chest Portable 1 View  02/03/2011  *RADIOLOGY REPORT*  Clinical Data: Intubation  PORTABLE CHEST - 1 VIEW  Comparison: 06/21/2009  Findings: Endotracheal tube is approximately 3 cm above the carina. NG tube in place in the stomach.  Left lower lobe airspace disease is present and may represent atelectasis or pneumonia.  Right lung is clear.  Negative for heart failure or effusion.  IMPRESSION: Endotracheal tube 3 cm above the carina.  Left lower lobe consolidation.  Original Report Authenticated By: Truett Perna, M.D.    Review of Systems  Unable to perform ROS: intubated   Blood pressure 147/54, pulse 67, temperature 98.1 F (36.7 C), temperature source Core (Comment), resp. rate 15, height 5' 6.93" (1.7 m), weight 74.9 kg (165 lb 2 oz), SpO2 100.00%. Physical Exam  Constitutional: She appears well-developed and well-nourished.       Intubated and sedated.  HENT:  Head: Normocephalic and atraumatic.  Right Ear: External ear normal. Tympanic membrane is bulging. A middle ear effusion (cloudy) is present.  Left Ear:  External ear and ear canal normal.  Nose: Nose normal.  Eyes: Conjunctivae and EOM are normal. Pupils are equal, round, and reactive to light.  Neck:       No mass.  Cardiovascular: Normal rate and regular rhythm.   Respiratory:       Defer to critical care.  GI:       Defer to critical care.  Genitourinary:       Did not examine.  Musculoskeletal:       When sedation weaned, moving all four extremities.  Lymphadenopathy:    She has no cervical adenopathy.  Neurological:       Sedated but responsive.  Skin: Skin is warm and dry.  Psychiatric:  Unable to assess.    Assessment/Plan: Right acute otomastoiditis with gram negative meningitis. I personally reviewed the head CT showing opacification of the right mastoid and middle ear with a normal left side.  I discussed the situation with the patient's brother, Mr. Hall Busing, by phone and recommended proceeding with placement of a tympanostomy tube in the right ear.  This is intended to allow infection to drain.  She is on appropriate antibiotics which is the other part of treatment for this problem.  I recommended to CCM that an MRI be ordered to rule out dural sinus thrombosis.  She can be weaned toward extubation when appropriate.  Falcon Mccaskey D 02/05/2011, 12:15 PM

## 2011-02-06 ENCOUNTER — Inpatient Hospital Stay (HOSPITAL_COMMUNITY): Payer: Medicare Other

## 2011-02-06 DIAGNOSIS — N179 Acute kidney failure, unspecified: Secondary | ICD-10-CM

## 2011-02-06 DIAGNOSIS — G000 Hemophilus meningitis: Secondary | ICD-10-CM

## 2011-02-06 DIAGNOSIS — J9600 Acute respiratory failure, unspecified whether with hypoxia or hypercapnia: Secondary | ICD-10-CM

## 2011-02-06 LAB — GLUCOSE, CAPILLARY
Glucose-Capillary: 133 mg/dL — ABNORMAL HIGH (ref 70–99)
Glucose-Capillary: 106 mg/dL — ABNORMAL HIGH (ref 70–99)
Glucose-Capillary: 141 mg/dL — ABNORMAL HIGH (ref 70–99)

## 2011-02-06 LAB — BASIC METABOLIC PANEL
BUN: 32 mg/dL — ABNORMAL HIGH (ref 6–23)
CO2: 21 mEq/L (ref 19–32)
Calcium: 7.1 mg/dL — ABNORMAL LOW (ref 8.4–10.5)
Chloride: 117 mEq/L — ABNORMAL HIGH (ref 96–112)
Creatinine, Ser: 1.22 mg/dL — ABNORMAL HIGH (ref 0.50–1.10)
GFR calc Af Amer: 48 mL/min — ABNORMAL LOW (ref >90)
GFR calc non Af Amer: 42 mL/min — ABNORMAL LOW (ref >90)
Glucose, Bld: 148 mg/dL — ABNORMAL HIGH (ref 70–99)
Potassium: 3.3 mEq/L — ABNORMAL LOW (ref 3.5–5.1)
Sodium: 146 mEq/L — ABNORMAL HIGH (ref 135–145)

## 2011-02-06 LAB — CBC
HCT: 31.6 % — ABNORMAL LOW (ref 36.0–46.0)
Hemoglobin: 10.4 g/dL — ABNORMAL LOW (ref 12.0–15.0)
MCH: 26.7 pg (ref 26.0–34.0)
MCHC: 32.9 g/dL (ref 30.0–36.0)
MCV: 81.2 fL (ref 78.0–100.0)
Platelets: 277 10*3/uL (ref 150–400)
RBC: 3.89 MIL/uL (ref 3.87–5.11)
RDW: 14 % (ref 11.5–15.5)
WBC: 21.6 10*3/uL — ABNORMAL HIGH (ref 4.0–10.5)

## 2011-02-06 MED ORDER — PANTOPRAZOLE SODIUM 40 MG PO TBEC
40.0000 mg | DELAYED_RELEASE_TABLET | Freq: Every day | ORAL | Status: DC
  Administered 2011-02-06 – 2011-02-12 (×7): 40 mg via ORAL
  Filled 2011-02-06 (×7): qty 1

## 2011-02-06 MED ORDER — POTASSIUM CHLORIDE CRYS ER 20 MEQ PO TBCR
40.0000 meq | EXTENDED_RELEASE_TABLET | Freq: Once | ORAL | Status: AC
  Administered 2011-02-06: 40 meq via ORAL
  Filled 2011-02-06: qty 2

## 2011-02-06 MED ORDER — SERTRALINE HCL 50 MG PO TABS
50.0000 mg | ORAL_TABLET | Freq: Every day | ORAL | Status: DC
  Administered 2011-02-06 – 2011-02-11 (×6): 50 mg via ORAL
  Filled 2011-02-06 (×7): qty 1

## 2011-02-06 MED ORDER — ENOXAPARIN SODIUM 40 MG/0.4ML ~~LOC~~ SOLN
40.0000 mg | SUBCUTANEOUS | Status: DC
  Administered 2011-02-06 – 2011-02-11 (×6): 40 mg via SUBCUTANEOUS
  Filled 2011-02-06 (×8): qty 0.4

## 2011-02-06 MED ORDER — POTASSIUM CHLORIDE 20 MEQ/15ML (10%) PO LIQD
40.0000 meq | Freq: Once | ORAL | Status: DC
  Filled 2011-02-06: qty 30

## 2011-02-06 NOTE — Progress Notes (Signed)
[facility] Progress Note Patient Name: Cindy Wong DOB: [birth year] MRN: [id number]  Date of Service  02/06/2011   HPI/Events of Note   Hypokalemia  eICU Interventions  Potassium replaced   Intervention Category Intermediate Interventions: Electrolyte abnormality - evaluation and management  Shelly Spenser 02/06/2011, 6:50 AM

## 2011-02-06 NOTE — Progress Notes (Signed)
Patient name: Cindy Wong Medical record number: [id number] Date of birth: 01-19-1932 Age: 77 y.o. Gender: female PCP: [name], MD, MD  Date: 02/06/2011 Reason for Consult: AMS Referring Physician: Tomi Bamberger  Brief history This is a 77yo F who presented the ER with HA,Rt ear pain radiating to jaw,  had progressive AMS with eventual intubation, and CSF showing Gram neg meningitis.  PMH - DM-2 , gastric stromal tumor s/p resection  Lines/tubes ETT 12/15 >>>12/17 R subclavian CVL 12/15 >>> OGT 12/15 >>>12/17 Foley 12/15 >>> Right myringotomy with tube placement   Culture data/sepsis markers Blood cx 12/15 >>> CSF cx 12/15 : GNR>>> Haemophilus influenza A CSF HSV PCR 12/15  >>> Sputum cx  12/15 >>>  Antibiotics vanc 12/15 >>>12/17 Ceftriaxone 12/15 >>> Ampicillin 12/15 >>>12/17 Acyclovir 12/15 >>>12/17 cipro gtts 12/17>>>  Protocols/consults ID  Events/studies Head CT  12/15 >> rt mastoiditis 12/17: Right myringotomy with tube placement 12/18: MRI skull: 1. Findings compatible with right otomastoiditis in this clinicalsetting. No contrast was administered, but there is no overtintracranial extension (e.g. no right temporal lobe edema). 2. Questionable trace debris layering in the lateral ventriclessuch as can be seen with meningitis or ventriculitis. 3. Otherwise no acute intracranial abnormality.4. Mild to moderate mastoid inflammatory changes also on the left. No obstructing etiology identified in the nasopharynx.    Subjective S/P tube placement today. Looks great from [employer].   Objective Temp:  [97.4 F (36.3 C)-99.7 F (37.6 C)] 97.4 F (36.3 C) (12/18 1200) Pulse Rate:  [57-84] 61  (12/18 0600) Resp:  [13-18] 16  (12/18 0600) BP: (130-156)/(52-92) 130/92 mmHg (12/18 0800) SpO2:  [98 %-100 %] 98 % (12/18 0600) FiO2 (%):  [30.2 %] 30.2 % (12/17 1500) Weight:  [78.4 kg (172 lb 13.5 oz)] 172 lb 13.5 oz (78.4 kg) (12/18 0000)  exam Gen:  awake, no distress ENT moist mucous membranes. Lymph no cervical or supraclavicular lymphadenopathy CV RRR, no murmur pulm CTAB without wheezes or crackles abd soft, non-tender, and non-distended Ext no LE edema or clubbing Skin no rashes   Labs  Lab 02/06/11 0509 02/05/11 0535 02/04/11 1459  NA 146* 144 141  K 3.3* 3.5 3.6  CL 117* 113* 108  CO2 21 21 22   BUN 32* 32* 28*  CREATININE 1.22* 1.87* 2.11*  GLUCOSE 148* 172* 189*    Lab 02/06/11 0509 02/05/11 0535 02/04/11 0445  HGB 10.4* 10.0* 11.3*  HCT 31.6* 31.7* 35.1*  WBC 21.6* 24.9* 22.9*  PLT 277 257 279   PCXR: low basilar volume. R>L.   Assessment and Plan  This is a 77yo F who presented with HA, AMS, fever, and CSF consistent with meningitis.  Meningitis due to rt mastoid infection, now s/p  Right myringotomy with tube placement on 12/17 Plan: -cont current ABX: ceftriaxone  -- cipro gtts in ear per ENT -- dexamethasone 0.15mg /kg q6 x 4 days; stopped by infectious diseases and 02/05/2011 --MRI on 12/18 per ENT recs.   Respiratory failure due to above and enephalopathy: Resolved   LLL PNA (improved NOS) Plan: -- secondary ABX coverage as above  AMS/encephalopathy: (resolved) Plan: -supportive care  Hyperglycemia, better off steroids plan -- hold po agents, cover with insulin in setting of acute illness  Physical debilitation Plan: -pt and ot consult   Best Practices -- LMWH -- PPI --to reg medical bed  FULL CODE   BABCOCK,PETE 02/06/2011, 1:42 PM          STAFF NOTE: I, Dr Ann Lions have  personally reviewed patient's available data, including medical history, events of note, physical examination and test results as part of my evaluation. I have discussed with NP and other care providers such as pharmacist, RN and RRT.  In addition,  I personally evaluated patient and elicited key findings of rapid improvement from Haemophilus influenza meningitis secondary to otitis media in the right  ear. She had acute respiratory failure as a result of the above. She was successfully extubated yesterday and is currently well and intact neurologically. Appreciate consultation from infectious diseases and ear nose throat services. Pulmonary critical care we'll transfer service to [facility] hospitalist  Rest per NP whose note is outlined above and that I agree with

## 2011-02-06 NOTE — Progress Notes (Signed)
Patient ID: Cindy Wong, female   DOB: 26-Mar-1931, 77 y.o.   MRN: [id number]  INFECTIOUS DISEASE PROGRESS NOTE    Date of Admission:  02/03/2011                 Day 3 antibiotics         Principal Problem:  *Meningitis due to bacterium Active Problems:  Chronic mastoiditis of right side      . cefTRIAXone (ROCEPHIN)  IV  2 g Intravenous Q12H  . ciprofloxacin-dexamethasone  4 drop Right Ear BID  . enoxaparin (LOVENOX) injection  40 mg Subcutaneous Q24H  . insulin aspart  0-15 Units Subcutaneous TID WC  . insulin glargine  5 Units Subcutaneous QHS  . pantoprazole sodium  40 mg Per Tube Q1200  . potassium chloride  40 mEq Oral Once  . vitamin A & D      . DISCONTD: sodium chloride   Intravenous Once  . DISCONTD: antiseptic oral rinse  15 mL Mouth Rinse QID  . DISCONTD: chlorhexidine  15 mL Mouth Rinse BID  . DISCONTD: enoxaparin (LOVENOX) injection  30 mg Subcutaneous Q24H  . DISCONTD: potassium chloride  40 mEq Per Tube Once    Subjective: She is feeling much better. She does not recall much about the initial part of her hospitalization. She denies any headache or ear pain currently  Objective: Temp:  [97.4 F (36.3 C)-99.7 F (37.6 C)] 97.4 F (36.3 C) (12/18 1200) Pulse Rate:  [57-84] 61  (12/18 0600) Resp:  [13-18] 16  (12/18 0600) BP: (130-156)/(52-92) 130/92 mmHg (12/18 0800) SpO2:  [98 %-100 %] 98 % (12/18 0600) FiO2 (%):  [30.2 %] 30.2 % (12/17 1500) Weight:  [78.4 kg (172 lb 13.5 oz)] 172 lb 13.5 oz (78.4 kg) (12/18 0000)  General: She is extubated, sitting up in a chair watching TV Skin: No rash Lungs: Clear Cor: Regular S1 and S2 no murmurs Abdomen: Soft and nontender Her neck is supple  Lab Results Lab Results  Component Value Date   WBC 21.6* 02/06/2011   HGB 10.4* 02/06/2011   HCT 31.6* 02/06/2011   MCV 81.2 02/06/2011   PLT 277 02/06/2011    Lab Results  Component Value Date   CREATININE 1.22* 02/06/2011   BUN 32* 02/06/2011   NA 146*  02/06/2011   K 3.3* 02/06/2011   CL 117* 02/06/2011   CO2 21 02/06/2011    Lab Results  Component Value Date   ALT 16 02/05/2011   AST 22 02/05/2011   ALKPHOS 41 02/05/2011   BILITOT 0.1* 02/05/2011       Microbiology: Recent Results (from the past 240 hour(s))  CSF CULTURE     Status: Normal (Preliminary result)   Collection Time   02/03/11  5:33 PM      Component Value Range Status Comment   Specimen Description CSF   Final    Special Requests NONE   Final    Gram Stain     Final    Value: CYTOSPIN WBC PRESENT, PREDOMINANTLY PMN     GRAM NEGATIVE RODS     Gram Stain Report Called to,Read Back By and Verified With: Gram Stain Report Called to,Read Back By and Verified With: ANDERSONS/[id number] Performed at [facility] ([place])   Culture     Final    Value: FEW HAEMOPHILUS INFLUENZAE     Note: BETA LACTAMASE NEGATIVE Referred to [facility] in [place], [place] for Serotyping. CRITICAL RESULT CALLED TO, READ  BACK BY AND VERIFIED WITH: DR [facility] Cindy Wong @ 1410 02/05/11 WICKN   Report Status PENDING   Incomplete   PATHOLOGIST SMEAR REVIEW     Status: Normal   Collection Time   02/03/11  5:33 PM      Component Value Range Status Comment   Tech Review Reviewed By Violet Baldy, M.D.   Final   CULTURE, BLOOD (ROUTINE X 2)     Status: Normal (Preliminary result)   Collection Time   02/03/11  7:30 PM      Component Value Range Status Comment   Specimen Description BLOOD RIGHT HAND   Final    Special Requests BOTTLES DRAWN AEROBIC AND ANAEROBIC 5CC EACH   Final    Setup Time [id number]   Final    Culture     Final    Value:        BLOOD CULTURE RECEIVED NO GROWTH TO DATE CULTURE WILL BE HELD FOR 5 DAYS BEFORE ISSUING A FINAL NEGATIVE REPORT   Report Status PENDING   Incomplete   CULTURE, BLOOD (ROUTINE X 2)     Status: Normal (Preliminary result)   Collection Time   02/03/11  7:39 PM      Component Value Range Status Comment   Specimen  Description BLOOD LEFT HAND   Final    Special Requests BOTTLES DRAWN AEROBIC ONLY 5CC   Final    Setup Time [id number]   Final    Culture     Final    Value:        BLOOD CULTURE RECEIVED NO GROWTH TO DATE CULTURE WILL BE HELD FOR 5 DAYS BEFORE ISSUING A FINAL NEGATIVE REPORT   Report Status PENDING   Incomplete   GRAM STAIN     Status: Normal   Collection Time   02/03/11  7:48 PM      Component Value Range Status Comment   Specimen Description CSF   Final    Special Requests NONE   Final    Gram Stain     Final    Value: WBC PRESENT, PREDOMINANTLY PMN     GRAM NEGATIVE RODS     Gram Stain Report Called to,Read Back By and Verified With: [id number]   Report Status 02/03/2011 FINAL   Final   MRSA PCR SCREENING     Status: Normal   Collection Time   02/03/11 10:18 PM      Component Value Range Status Comment   MRSA by PCR NEGATIVE  NEGATIVE  Final   CULTURE, RESPIRATORY     Status: Normal (Preliminary result)   Collection Time   02/03/11 11:45 PM      Component Value Range Status Comment   Specimen Description SPUTUM   Final    Special Requests NONE   Final    Gram Stain     Final    Value: ABUNDANT WBC PRESENT,BOTH PMN AND MONONUCLEAR     NO SQUAMOUS EPITHELIAL CELLS SEEN     NO ORGANISMS SEEN   Culture NORMAL OROPHARYNGEAL FLORA   Final    Report Status PENDING   Incomplete   EAR CULTURE     Status: Normal (Preliminary result)   Collection Time   02/05/11 12:40 PM      Component Value Range Status Comment   Specimen Description EAR   Final    Special Requests Normal   Final    Culture NO GROWTH 1 DAY   Final    Report Status PENDING  Incomplete     Studies/Results: Mr Herby Abraham Contrast  02/05/2011  *RADIOLOGY REPORT*  Clinical Data: 77 year old female with fever, bacterial meningitis, right ear infection.  Drainage tube placed.  Respiratory failure.  MRI HEAD WITHOUT CONTRAST  Technique:  Multiplanar, multiecho pulse sequences of the brain and  surrounding structures were obtained according to standard protocol without intravenous contrast.  Comparison: Head CTs 02/03/2011 and earlier.  Findings: Right mastoid air cells are completely opacified.  Right tympanic cavity and external auditory canal are opacified.  There is also scattered fluid in the left mastoids.  Both petrous apices are pneumatized, with fluid worse on the right.  Negative visualized nasopharynx.  Comparatively minor paranasal sinus mucosal thickening.  Partially empty sella. Major intracranial vascular flow voids are preserved except for the distal right vertebral artery which appears chronically nondominant.  The right transverse and sigmoid sinus flow voids plus the right internal jugular bulb are preserved. No restricted diffusion to suggest acute infarction.  No midline shift, mass effect, evidence of mass lesion, ventriculomegaly, acute intracranial hemorrhage.  Cervicomedullary junction is within normal limits.  Coarse para falcine dural calcifications.  There may be trace debris layering in the atria of the lateral ventricles (series 4 image 14).  No other extra-axial collection. Gray-white matter signal is within normal limits for age throughout the brain.  Negative visualized cervical spine.  Bone marrow signal remains normal.  Visualized orbit soft tissues are within normal limits.  Negative scalp soft tissues.  IMPRESSION: 1.  Findings compatible with right otomastoiditis in this clinical setting.  No contrast was administered, but there is no overt intracranial extension (e.g. no right temporal lobe edema). 2.  Questionable trace debris layering in the lateral ventricles such as can be seen with meningitis or ventriculitis. 3. Otherwise no acute intracranial abnormality. 4.  Mild to moderate mastoid inflammatory changes also on the left. No obstructing etiology identified in the nasopharynx.  Original Report Authenticated By: Randall An, M.D.   Dg Chest Port 1  View  02/06/2011  *RADIOLOGY REPORT*  Clinical Data: Respiratory distress.  PORTABLE CHEST - 1 VIEW  Comparison: 02/05/2011  Findings: Endotracheal tube has been removed.  Stable position of the right subclavian central line.  The catheter tip is near the junction of the left subclavian vein and left innominate vein.  No evidence for a pneumothorax.  There are increased basilar densities that may represent atelectasis and pleural fluid.  Heart size is grossly stable.  Nasogastric tube has been removed.  IMPRESSION: Removal of endotracheal tube with increased basilar densities. Findings most likely represent atelectasis but cannot exclude pleural fluid.  Stable position of the central line.  Original Report Authenticated By: Markus Daft, M.D.   Dg Chest Port 1 View  02/05/2011  *RADIOLOGY REPORT*  Clinical Data: Evaluate tube position.  PORTABLE CHEST - 1 VIEW  Comparison: 02/03/2011  Findings:  Endotracheal tube tip is located approximately 2.4 cm proximal to the carina.  An NG tube descends into the abdomen, tip not visualized.  A right subclavian central venous catheter crosses midline with tip projecting over the left brachiocephalic vein. Artifact overlies theright lower lung.  The left lower lung is excluded from the image and a retrocardiac opacity cannot be excluded.  No pneumothorax identified. Visualized mediastinal contours are unchanged.  IMPRESSION: Unchanged support devices.  Right subclavian line again noted to cross midline.  No pneumothorax.  Original Report Authenticated By: Suanne Marker, M.D.     Assessment: She is responding rapidly  and well to become paracentesis and antibiotic therapy for Haemophilus influenzae otitis media, mastoiditis and meningitis. She will need at least one more week of IV ceftriaxone.  Plan: 1. Continue ceftriaxone. 2. I will be out of town tomorrow. I will followup on December 20.   Michel Bickers, MD [facility] [phone] pager   [phone] cell 02/06/2011, 2:59 PM

## 2011-02-06 NOTE — Progress Notes (Signed)
Subjective: Extubated yesterday.  Feels good today.  Right ear no longer hurting.  Neurologically normal.  Objective: Vital signs in last 24 hours: Temp:  [97.4 F (36.3 C)-99.9 F (37.7 C)] 98.5 F (36.9 C) (12/18 1600) Pulse Rate:  [57-71] 64  (12/18 1600) Resp:  [13-19] 19  (12/18 1600) BP: (119-156)/(52-92) 119/82 mmHg (12/18 1200) SpO2:  [97 %-100 %] 99 % (12/18 1600) Weight:  [78.4 kg (172 lb 13.5 oz)] 172 lb 13.5 oz (78.4 kg) (12/18 0000) Last BM Date: 02/06/11  Intake/Output from previous day: 12/17 0701 - 12/18 0700 In: 2653 [I.V.:2453; IV Piggyback:200] Out: 1395 [Urine:1395] Intake/Output this shift: Total I/O In: 825 [I.V.:825] Out: 610 [Urine:610]  General appearance: alert, cooperative and no distress Head: Normocephalic, without obvious abnormality, atraumatic Ears: Left canal normal.  Left TM intact.  Left middle ear aerated.  Right canal with small dry blood.  Right TM with patent tympanostomy tube in place.  Right middle ear aerated.  Lab Results:   [facility] 02/06/11 0509 02/05/11 0535  WBC 21.6* 24.9*  HGB 10.4* 10.0*  HCT 31.6* 31.7*  PLT 277 257   BMET  Basename 02/06/11 0509 02/05/11 0535  NA 146* 144  K 3.3* 3.5  CL 117* 113*  CO2 21 21  GLUCOSE 148* 172*  BUN 32* 32*  CREATININE 1.22* 1.87*  CALCIUM 7.1* 7.3*   PT/INR No results found for this basename: LABPROT:2,INR:2 in the last 72 hours ABG  Basename 02/04/11 0500 02/03/11 1857  PHART 7.459* 7.505*  HCO3 22.6 24.2*    Studies/Results: [name] Contrast  02/05/2011  *RADIOLOGY REPORT*  Clinical Data: 77 year old female with fever, bacterial meningitis, right ear infection.  Drainage tube placed.  Respiratory failure.  MRI HEAD WITHOUT CONTRAST  Technique:  Multiplanar, multiecho pulse sequences of the brain and surrounding structures were obtained according to standard protocol without intravenous contrast.  Comparison: Head CTs 02/03/2011 and earlier.  Findings: Right  mastoid air cells are completely opacified.  Right tympanic cavity and external auditory canal are opacified.  There is also scattered fluid in the left mastoids.  Both petrous apices are pneumatized, with fluid worse on the right.  Negative visualized nasopharynx.  Comparatively minor paranasal sinus mucosal thickening.  Partially empty sella. Major intracranial vascular flow voids are preserved except for the distal right vertebral artery which appears chronically nondominant.  The right transverse and sigmoid sinus flow voids plus the right internal jugular bulb are preserved. No restricted diffusion to suggest acute infarction.  No midline shift, mass effect, evidence of mass lesion, ventriculomegaly, acute intracranial hemorrhage.  Cervicomedullary junction is within normal limits.  Coarse para falcine dural calcifications.  There may be trace debris layering in the atria of the lateral ventricles (series 4 image 14).  No other extra-axial collection. Gray-white matter signal is within normal limits for age throughout the brain.  Negative visualized cervical spine.  Bone marrow signal remains normal.  Visualized orbit soft tissues are within normal limits.  Negative scalp soft tissues.  IMPRESSION: 1.  Findings compatible with right otomastoiditis in this clinical setting.  No contrast was administered, but there is no overt intracranial extension (e.g. no right temporal lobe edema). 2.  Questionable trace debris layering in the lateral ventricles such as can be seen with meningitis or ventriculitis. 3. Otherwise no acute intracranial abnormality. 4.  Mild to moderate mastoid inflammatory changes also on the left. No obstructing etiology identified in the nasopharynx.  Original Report Authenticated By: Randall An, M.D.  Dg Chest Port 1 View  02/06/2011  *RADIOLOGY REPORT*  Clinical Data: Respiratory distress.  PORTABLE CHEST - 1 VIEW  Comparison: 02/05/2011  Findings: Endotracheal tube has been  removed.  Stable position of the right subclavian central line.  The catheter tip is near the junction of the left subclavian vein and left innominate vein.  No evidence for a pneumothorax.  There are increased basilar densities that may represent atelectasis and pleural fluid.  Heart size is grossly stable.  Nasogastric tube has been removed.  IMPRESSION: Removal of endotracheal tube with increased basilar densities. Findings most likely represent atelectasis but cannot exclude pleural fluid.  Stable position of the central line.  Original Report Authenticated By: Markus Daft, M.D.   Dg Chest Port 1 View  02/05/2011  *RADIOLOGY REPORT*  Clinical Data: Evaluate tube position.  PORTABLE CHEST - 1 VIEW  Comparison: 02/03/2011  Findings:  Endotracheal tube tip is located approximately 2.4 cm proximal to the carina.  An NG tube descends into the abdomen, tip not visualized.  A right subclavian central venous catheter crosses midline with tip projecting over the left brachiocephalic vein. Artifact overlies theright lower lung.  The left lower lung is excluded from the image and a retrocardiac opacity cannot be excluded.  No pneumothorax identified. Visualized mediastinal contours are unchanged.  IMPRESSION: Unchanged support devices.  Right subclavian line again noted to cross midline.  No pneumothorax.  Original Report Authenticated By: Suanne Marker, M.D.    Anti-infectives: Anti-infectives     Start     Dose/Rate Route Frequency Ordered Stop   02/05/11 1600   cefTRIAXone (ROCEPHIN) 2 g in dextrose 5 % 50 mL IVPB        2 g 100 mL/hr over 30 Minutes Intravenous Every 12 hours 02/05/11 1427     02/05/11 0400   vancomycin (VANCOCIN) IVPB 1000 mg/200 mL premix  Status:  Discontinued        1,000 mg 200 mL/hr over 60 Minutes Intravenous Every 24 hours 02/04/11 1409 02/05/11 1427   02/04/11 2000   ampicillin (OMNIPEN) 2 g in sodium chloride 0.9 % 50 mL IVPB  Status:  Discontinued        2 g 150 mL/hr  over 20 Minutes Intravenous Every 6 hours 02/04/11 1409 02/05/11 1427   02/04/11 1830   ceFEPIme (MAXIPIME) 1 g in dextrose 5 % 50 mL IVPB  Status:  Discontinued        1 g 100 mL/hr over 30 Minutes Intravenous Every 24 hours 02/04/11 1808 02/05/11 1427   02/04/11 0600   vancomycin (VANCOCIN) 750 mg in sodium chloride 0.9 % 150 mL IVPB  Status:  Discontinued        750 mg 150 mL/hr over 60 Minutes Intravenous Every 12 hours 02/03/11 2224 02/04/11 1407   02/04/11 0500   cefTRIAXone (ROCEPHIN) 2 g in dextrose 5 % 50 mL IVPB  Status:  Discontinued        2 g 100 mL/hr over 30 Minutes Intravenous Every 12 hours 02/03/11 2224 02/04/11 1754   02/04/11 0400   acyclovir (ZOVIRAX) 725 mg in dextrose 5 % 150 mL IVPB  Status:  Discontinued        10 mg/kg  72.7 kg 164.5 mL/hr over 60 Minutes Intravenous Every 8 hours 02/04/11 0319 02/04/11 1155   02/04/11 0000   ampicillin (PRINCIPEN) 125 MG/5ML suspension 2,000 mg  Status:  Discontinued        2,000 mg Oral 4 times per day  02/03/11 2046 02/03/11 2109   02/03/11 2200   ampicillin (OMNIPEN) 2 g in sodium chloride 0.9 % 50 mL IVPB  Status:  Discontinued        2 g 150 mL/hr over 20 Minutes Intravenous Every 4 hours 02/03/11 2135 02/04/11 1407   02/03/11 1630   vancomycin (VANCOCIN) injection 1 g  Status:  Discontinued        1 g Intravenous  Once 02/03/11 1554 02/03/11 1559   02/03/11 1630   acyclovir (ZOVIRAX) 700 mg in dextrose 5 % 100 mL IVPB        700 mg 114 mL/hr over 60 Minutes Intravenous  Once 02/03/11 1555 02/03/11 2055   02/03/11 1630   vancomycin (VANCOCIN) IVPB 1000 mg/200 mL premix        1,000 mg 200 mL/hr over 60 Minutes Intravenous  Once 02/03/11 1559 02/03/11 1900   02/03/11 1600   cefTRIAXone (ROCEPHIN) 2 g in dextrose 5 % 50 mL IVPB        2 g 100 mL/hr over 30 Minutes Intravenous  Once 02/03/11 1549 02/03/11 1745          Assessment/Plan: Meningitis, right acute otomastoiditis POD 1 right tympanostomy tube  placement. Doing very well.  Continue IV antibiotics per I.D.  Continue Ciprodex drops in right ear for 7 days.  Follow-up after discharge.  LOS: 3 days    Richardine Peppers D 02/06/2011

## 2011-02-07 ENCOUNTER — Encounter: Payer: Self-pay | Admitting: Gynecology

## 2011-02-07 DIAGNOSIS — C801 Malignant (primary) neoplasm, unspecified: Secondary | ICD-10-CM | POA: Insufficient documentation

## 2011-02-07 DIAGNOSIS — I10 Essential (primary) hypertension: Secondary | ICD-10-CM | POA: Insufficient documentation

## 2011-02-07 DIAGNOSIS — D219 Benign neoplasm of connective and other soft tissue, unspecified: Secondary | ICD-10-CM | POA: Insufficient documentation

## 2011-02-07 LAB — GLUCOSE, CAPILLARY
Glucose-Capillary: 124 mg/dL — ABNORMAL HIGH (ref 70–99)
Glucose-Capillary: 123 mg/dL — ABNORMAL HIGH (ref 70–99)
Glucose-Capillary: 114 mg/dL — ABNORMAL HIGH (ref 70–99)
Glucose-Capillary: 147 mg/dL — ABNORMAL HIGH (ref 70–99)
Glucose-Capillary: 146 mg/dL — ABNORMAL HIGH (ref 70–99)
Glucose-Capillary: 213 mg/dL — ABNORMAL HIGH (ref 70–99)
Glucose-Capillary: 167 mg/dL — ABNORMAL HIGH (ref 70–99)

## 2011-02-07 LAB — BASIC METABOLIC PANEL
BUN: 31 mg/dL — ABNORMAL HIGH (ref 6–23)
CO2: 23 mEq/L (ref 19–32)
Calcium: 7.3 mg/dL — ABNORMAL LOW (ref 8.4–10.5)
Chloride: 117 mEq/L — ABNORMAL HIGH (ref 96–112)
Creatinine, Ser: 1.08 mg/dL (ref 0.50–1.10)
GFR calc Af Amer: 56 mL/min — ABNORMAL LOW (ref >90)
GFR calc non Af Amer: 48 mL/min — ABNORMAL LOW (ref >90)
Glucose, Bld: 168 mg/dL — ABNORMAL HIGH (ref 70–99)
Potassium: 3 mEq/L — ABNORMAL LOW (ref 3.5–5.1)
Sodium: 147 mEq/L — ABNORMAL HIGH (ref 135–145)

## 2011-02-07 LAB — CBC
HCT: 32.1 % — ABNORMAL LOW (ref 36.0–46.0)
Hemoglobin: 10.3 g/dL — ABNORMAL LOW (ref 12.0–15.0)
MCH: 25.8 pg — ABNORMAL LOW (ref 26.0–34.0)
MCHC: 32.1 g/dL (ref 30.0–36.0)
MCV: 80.5 fL (ref 78.0–100.0)
Platelets: 272 10*3/uL (ref 150–400)
RBC: 3.99 MIL/uL (ref 3.87–5.11)
RDW: 13.8 % (ref 11.5–15.5)
WBC: 16.1 10*3/uL — ABNORMAL HIGH (ref 4.0–10.5)

## 2011-02-07 LAB — CULTURE, RESPIRATORY W GRAM STAIN: Culture: NORMAL

## 2011-02-07 MED ORDER — POTASSIUM CHLORIDE CRYS ER 10 MEQ PO TBCR
10.0000 meq | EXTENDED_RELEASE_TABLET | Freq: Every day | ORAL | Status: DC
  Administered 2011-02-08 – 2011-02-11 (×4): 10 meq via ORAL
  Filled 2011-02-07 (×5): qty 1

## 2011-02-07 MED ORDER — TRIAMTERENE-HCTZ 37.5-25 MG PO TABS
0.5000 | ORAL_TABLET | Freq: Every day | ORAL | Status: DC
  Administered 2011-02-07: 0.5 via ORAL
  Filled 2011-02-07 (×2): qty 0.5

## 2011-02-07 MED ORDER — POTASSIUM CHLORIDE CRYS ER 20 MEQ PO TBCR
40.0000 meq | EXTENDED_RELEASE_TABLET | Freq: Once | ORAL | Status: AC
  Administered 2011-02-07: 40 meq via ORAL
  Filled 2011-02-07: qty 2

## 2011-02-07 MED ORDER — SIMVASTATIN 20 MG PO TABS
20.0000 mg | ORAL_TABLET | Freq: Every day | ORAL | Status: DC
  Administered 2011-02-07 – 2011-02-11 (×5): 20 mg via ORAL
  Filled 2011-02-07 (×6): qty 1

## 2011-02-07 MED ORDER — SERTRALINE HCL 20 MG/ML PO CONC: 50.0000 mg | Freq: Every day | ORAL | Status: DC

## 2011-02-07 MED ORDER — FLORA-Q PO CAPS
1.0000 | ORAL_CAPSULE | Freq: Every day | ORAL | Status: DC
  Administered 2011-02-07 – 2011-02-12 (×6): 1 via ORAL
  Filled 2011-02-07 (×6): qty 1

## 2011-02-07 MED ORDER — LISINOPRIL 20 MG PO TABS
20.0000 mg | ORAL_TABLET | Freq: Every day | ORAL | Status: DC
  Administered 2011-02-07 – 2011-02-10 (×4): 20 mg via ORAL
  Filled 2011-02-07 (×6): qty 1

## 2011-02-07 NOTE — Progress Notes (Signed)
Nutrition Follow-up  Diet Order:  Regular, intake 50-70% of meals  - Pt extubated 12/17. Re-estimated pt's nutritional needs to be 1800-2000 calories, 90-100g protein. Pt reports improvement in loose stools, only 1 so far today. Pt reports trying to eat a lot of food yesterday r/t being hungry and felt bad all night. Pt reports eating lighter today and tolerating foods better, denies any nausea. Assisted pt with ordering dinner and breakfast for tomorrow.   Meds: Scheduled Meds:   . cefTRIAXone (ROCEPHIN)  IV  2 g Intravenous Q12H  . ciprofloxacin-dexamethasone  4 drop Right Ear BID  . enoxaparin (LOVENOX) injection  40 mg Subcutaneous Q24H  . Flora-Q  1 capsule Oral Daily  . insulin aspart  0-15 Units Subcutaneous TID WC  . insulin glargine  5 Units Subcutaneous QHS  . lisinopril  20 mg Oral Daily  . pantoprazole  40 mg Oral Q1200  . potassium chloride  10 mEq Oral Daily  . potassium chloride  40 mEq Oral Once  . sertraline  50 mg Oral Daily  . simvastatin  20 mg Oral q1800  . triamterene-hydrochlorothiazide  0.5 each Oral Daily  . DISCONTD: pantoprazole sodium  40 mg Per Tube Q1200  . DISCONTD: sertraline  50 mg Oral Daily   Continuous Infusions:   . sodium chloride 20 mL/hr at 02/06/11 1706   PRN Meds:.  Labs:  CMP     Component Value Date/Time   NA 147* 02/07/2011 0540   K 3.0* 02/07/2011 0540   CL 117* 02/07/2011 0540   CO2 23 02/07/2011 0540   GLUCOSE 168* 02/07/2011 0540   BUN 31* 02/07/2011 0540   CREATININE 1.08 02/07/2011 0540   CALCIUM 7.3* 02/07/2011 0540   PROT 5.7* 02/05/2011 0535   ALBUMIN 2.1* 02/05/2011 0535   AST 22 02/05/2011 0535   ALT 16 02/05/2011 0535   ALKPHOS 41 02/05/2011 0535   BILITOT 0.1* 02/05/2011 0535   GFRNONAA 48* 02/07/2011 0540   GFRAA 56* 02/07/2011 0540     Intake/Output Summary (Last 24 hours) at 02/07/11 1538 Last data filed at 02/07/11 1340  Gross per 24 hour  Intake    630 ml  Output   1476 ml  Net   -846 ml   Last  BM - 12/19  Weight Status:   Admit: 77.11kg 12/18: 78.4kg  Nutrition Dx: Inadequate oral intake - improving  Goal: Patient will meet 90-100% of estimated nutrition needs with diet advancement or enteral nutrition - not met, pt eating small portions of meals today.   Intervention: Encouraged increased intake as pt feels better. Pt denies any educational needs. Will monitor.   Glory Rosebush Pager #:  [phone]

## 2011-02-07 NOTE — Progress Notes (Signed)
Patient ID: Cindy Wong, female   DOB: Jan 06, 1932, 77 y.o.   MRN: [id number] Ms. Reason for Consult:Assuming care Referring Physician: Gerton  Cindy Wong is an 77 y.o. female.  HPI: Cindy Wong is a 77 yo female with pmhx as below.  She presented with fever and alt. Ms and eventually was diagnosed with H. Flu meningitis from an otomastoiditis.  She was intubated and underwent placement of tympanostomy tube and has received abx.  Fortunately she has improved. She feels better.  No pain, no sob.  She did have three loose stools last night.  She is not sleeping well in the hospital.  Bp has trended up as she is off her home meds.   We are assuming care.  Past Medical History  Diagnosis Date  . Hypertension   . 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 For colon ca and htn, sister died in her 57's of melanoma. , one child died stillborn, a sibling die  And they had a renal transplant  Social History:  reports that she has never smoked. She does not have any smokeless tobacco history on file. She reports that she does not drink alcohol or use illicit drugs.  Widowed, 1993,  Brother Delton [place] is closest relative.  He is hcpoa.  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 Sulfonamide Derivatives     REACTION: hives    Medications: I have  reviewed the patient's current medications.  Results for orders placed during the hospital encounter of 02/03/11 (from the past 48 hour(s))  GLUCOSE, CAPILLARY     Status: Abnormal   Collection Time   02/05/11 11:35 AM      Component Value Range Comment   Glucose-Capillary 144 (*) 70 - 99 (mg/dL)    Comment 1 Notify RN      Comment 2 Documented in Chart     EAR CULTURE     Status: Normal (Preliminary result)   Collection Time   02/05/11 12:40 PM      Component Value Range Comment   Specimen Description EAR      Special Requests Normal      Culture NO GROWTH 1 DAY      Report Status PENDING     GLUCOSE, CAPILLARY     Status: Abnormal   Collection Time   02/05/11  3:40 PM      Component Value Range Comment   Glucose-Capillary 163 (*) 70 - 99 (mg/dL)   GLUCOSE, CAPILLARY     Status: Abnormal   Collection Time   02/05/11  5:33 PM      Component Value Range Comment   Glucose-Capillary 159 (*) 70 - 99 (mg/dL)   GLUCOSE, CAPILLARY     Status: Abnormal   Collection Time   02/05/11  8:03 PM  Component Value Range Comment   Glucose-Capillary 124 (*) 70 - 99 (mg/dL)    Comment 1 Notify RN     GLUCOSE, CAPILLARY     Status: Abnormal   Collection Time   02/05/11 11:58 PM      Component Value Range Comment   Glucose-Capillary 133 (*) 70 - 99 (mg/dL)    Comment 1 Notify RN     GLUCOSE, CAPILLARY     Status: Abnormal   Collection Time   02/06/11  3:15 AM      Component Value Range Comment   Glucose-Capillary 123 (*) 70 - 99 (mg/dL)    Comment 1 Notify RN     CBC     Status: Abnormal   Collection Time   02/06/11  5:09 AM      Component Value Range Comment   WBC 21.6 (*) 4.0 - 10.5 (K/uL)    RBC 3.89  3.87 - 5.11 (MIL/uL)    Hemoglobin 10.4 (*) 12.0 - 15.0 (g/dL)    HCT 31.6 (*) 36.0 - 46.0 (%)    MCV 81.2  78.0 - 100.0 (fL)    MCH 26.7  26.0 - 34.0 (pg)    MCHC 32.9  30.0 - 36.0 (g/dL)    RDW 14.0  11.5 - 15.5 (%)    Platelets 277  150 - 400 (K/uL)   BASIC METABOLIC  PANEL     Status: Abnormal   Collection Time   02/06/11  5:09 AM      Component Value Range Comment   Sodium 146 (*) 135 - 145 (mEq/L)    Potassium 3.3 (*) 3.5 - 5.1 (mEq/L)    Chloride 117 (*) 96 - 112 (mEq/L)    CO2 21  19 - 32 (mEq/L)    Glucose, Bld 148 (*) 70 - 99 (mg/dL)    BUN 32 (*) 6 - 23 (mg/dL)    Creatinine, Ser 1.22 (*) 0.50 - 1.10 (mg/dL)    Calcium 7.1 (*) 8.4 - 10.5 (mg/dL)    GFR calc non Af Amer 42 (*) >90 (mL/min)    GFR calc Af Amer 48 (*) >90 (mL/min)   GLUCOSE, CAPILLARY     Status: Abnormal   Collection Time   02/06/11  7:32 AM      Component Value Range Comment   Glucose-Capillary 114 (*) 70 - 99 (mg/dL)    Comment 1 Notify RN      Comment 2 Documented in Chart     GLUCOSE, CAPILLARY     Status: Abnormal   Collection Time   02/06/11 12:05 PM      Component Value Range Comment   Glucose-Capillary 106 (*) 70 - 99 (mg/dL)    Comment 1 Notify RN      Comment 2 Documented in Chart     GLUCOSE, CAPILLARY     Status: Abnormal   Collection Time   02/06/11  3:32 PM      Component Value Range Comment   Glucose-Capillary 141 (*) 70 - 99 (mg/dL)    Comment 1 Notify RN      Comment 2 Documented in Chart     CBC     Status: Abnormal   Collection Time   02/07/11  5:40 AM      Component Value Range Comment   WBC 16.1 (*) 4.0 - 10.5 (K/uL)    RBC 3.99  3.87 - 5.11 (MIL/uL)    Hemoglobin 10.3 (*) 12.0 - 15.0 (g/dL)    HCT 32.1 (*)  36.0 - 46.0 (%)    MCV 80.5  78.0 - 100.0 (fL)    MCH 25.8 (*) 26.0 - 34.0 (pg)    MCHC 32.1  30.0 - 36.0 (g/dL)    RDW 13.8  11.5 - 15.5 (%)    Platelets 272  150 - 400 (K/uL)   BASIC METABOLIC PANEL     Status: Abnormal   Collection Time   02/07/11  5:40 AM      Component Value Range Comment   Sodium 147 (*) 135 - 145 (mEq/L)    Potassium 3.0 (*) 3.5 - 5.1 (mEq/L)    Chloride 117 (*) 96 - 112 (mEq/L)    CO2 23  19 - 32 (mEq/L)    Glucose, Bld 168 (*) 70 - 99 (mg/dL)    BUN 31 (*) 6 - 23 (mg/dL)    Creatinine, Ser 1.08  0.50  - 1.10 (mg/dL)    Calcium 7.3 (*) 8.4 - 10.5 (mg/dL)    GFR calc non Af Amer 48 (*) >90 (mL/min)    GFR calc Af Amer 56 (*) >90 (mL/min)   GLUCOSE, CAPILLARY     Status: Abnormal   Collection Time   02/07/11  8:44 AM      Component Value Range Comment   Glucose-Capillary 147 (*) 70 - 99 (mg/dL)    Comment 1 Documented in Chart      Comment 2 Notify RN       [name] Contrast  02/05/2011  *RADIOLOGY REPORT*  Clinical Data: 77 year old female with fever, bacterial meningitis, right ear infection.  Drainage tube placed.  Respiratory failure.  MRI HEAD WITHOUT CONTRAST  Technique:  Multiplanar, multiecho pulse sequences of the brain and surrounding structures were obtained according to standard protocol without intravenous contrast.  Comparison: Head CTs 02/03/2011 and earlier.  Findings: Right mastoid air cells are completely opacified.  Right tympanic cavity and external auditory canal are opacified.  There is also scattered fluid in the left mastoids.  Both petrous apices are pneumatized, with fluid worse on the right.  Negative visualized nasopharynx.  Comparatively minor paranasal sinus mucosal thickening.  Partially empty sella. Major intracranial vascular flow voids are preserved except for the distal right vertebral artery which appears chronically nondominant.  The right transverse and sigmoid sinus flow voids plus the right internal jugular bulb are preserved. No restricted diffusion to suggest acute infarction.  No midline shift, mass effect, evidence of mass lesion, ventriculomegaly, acute intracranial hemorrhage.  Cervicomedullary junction is within normal limits.  Coarse para falcine dural calcifications.  There may be trace debris layering in the atria of the lateral ventricles (series 4 image 14).  No other extra-axial collection. Gray-white matter signal is within normal limits for age throughout the brain.  Negative visualized cervical spine.  Bone marrow signal remains normal.   Visualized orbit soft tissues are within normal limits.  Negative scalp soft tissues.  IMPRESSION: 1.  Findings compatible with right otomastoiditis in this clinical setting.  No contrast was administered, but there is no overt intracranial extension (e.g. no right temporal lobe edema). 2.  Questionable trace debris layering in the lateral ventricles such as can be seen with meningitis or ventriculitis. 3. Otherwise no acute intracranial abnormality. 4.  Mild to moderate mastoid inflammatory changes also on the left. No obstructing etiology identified in the nasopharynx.  Original Report Authenticated By: Randall An, M.D.   Dg Chest Port 1 View  02/06/2011  *RADIOLOGY REPORT*  Clinical Data: Respiratory distress.  PORTABLE CHEST - 1 VIEW  Comparison: 02/05/2011  Findings: Endotracheal tube has been removed.  Stable position of the right subclavian central line.  The catheter tip is near the junction of the left subclavian vein and left innominate vein.  No evidence for a pneumothorax.  There are increased basilar densities that may represent atelectasis and pleural fluid.  Heart size is grossly stable.  Nasogastric tube has been removed.  IMPRESSION: Removal of endotracheal tube with increased basilar densities. Findings most likely represent atelectasis but cannot exclude pleural fluid.  Stable position of the central line.  Original Report Authenticated By: Markus Daft, M.D.    ROS:as per hpi  Blood pressure 173/74, pulse 65, temperature 98.9 F (37.2 C), temperature source Oral, resp. rate 16, height 5' 6.93" (1.7 m), weight 78.4 kg (172 lb 13.5 oz), SpO2 96.00%.  PHYSICAL EXAM:  She is sitting in chair. Alert and oriented and appropriate. Grossly neurologically intact.  Cta bilat. No w/r/r,  rrr no m/r/g.  abd is soft, nt, nd, no mass palpated. No sig. Edema. Moe times four, no pallor or icterus  Assessment/Plan: Patient Active Problem List  Diagnoses     . 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   On rocephin per ID , will need 7 days of this before transition to oral.  . Chronic mastoiditis of right side-per ent Diabetes type 2:  Follow [facility] htn-resume some home meds Hyperlipidemia:  Resume prava PT and OT as she is weak. Full code status     Majorie Santee A 02/07/2011, 11:19 AM

## 2011-02-07 NOTE — Plan of Care (Signed)
Problem: Inadequate Intake (NI-2.1) Goal: Food and/or nutrient delivery Individualized approach for food/nutrient provision.  Outcome: Progressing Pt tolerating diet well, intake improving

## 2011-02-08 LAB — DIFFERENTIAL
Basophils Absolute: 0 10*3/uL (ref 0.0–0.1)
Basophils Relative: 0 % (ref 0–1)
Eosinophils Absolute: 0 10*3/uL (ref 0.0–0.7)
Eosinophils Relative: 0 % (ref 0–5)
Lymphocytes Relative: 16 % (ref 12–46)
Lymphs Abs: 2.2 10*3/uL (ref 0.7–4.0)
Monocytes Absolute: 1.1 10*3/uL — ABNORMAL HIGH (ref 0.1–1.0)
Monocytes Relative: 8 % (ref 3–12)
Neutro Abs: 10.4 10*3/uL — ABNORMAL HIGH (ref 1.7–7.7)
Neutrophils Relative %: 76 % (ref 43–77)

## 2011-02-08 LAB — GLUCOSE, CAPILLARY
Glucose-Capillary: 131 mg/dL — ABNORMAL HIGH (ref 70–99)
Glucose-Capillary: 158 mg/dL — ABNORMAL HIGH (ref 70–99)
Glucose-Capillary: 176 mg/dL — ABNORMAL HIGH (ref 70–99)
Glucose-Capillary: 236 mg/dL — ABNORMAL HIGH (ref 70–99)

## 2011-02-08 LAB — COMPREHENSIVE METABOLIC PANEL
ALT: 20 U/L (ref 0–35)
AST: 15 U/L (ref 0–37)
Albumin: 2.2 g/dL — ABNORMAL LOW (ref 3.5–5.2)
Alkaline Phosphatase: 46 U/L (ref 39–117)
BUN: 19 mg/dL (ref 6–23)
CO2: 24 mEq/L (ref 19–32)
Calcium: 7.8 mg/dL — ABNORMAL LOW (ref 8.4–10.5)
Chloride: 112 mEq/L (ref 96–112)
Creatinine, Ser: 0.88 mg/dL (ref 0.50–1.10)
GFR calc Af Amer: 72 mL/min — ABNORMAL LOW (ref >90)
GFR calc non Af Amer: 62 mL/min — ABNORMAL LOW (ref >90)
Glucose, Bld: 150 mg/dL — ABNORMAL HIGH (ref 70–99)
Potassium: 2.9 mEq/L — ABNORMAL LOW (ref 3.5–5.1)
Sodium: 144 mEq/L (ref 135–145)
Total Bilirubin: 0.2 mg/dL — ABNORMAL LOW (ref 0.3–1.2)
Total Protein: 5.6 g/dL — ABNORMAL LOW (ref 6.0–8.3)

## 2011-02-08 LAB — CBC
HCT: 32.4 % — ABNORMAL LOW (ref 36.0–46.0)
Hemoglobin: 10.6 g/dL — ABNORMAL LOW (ref 12.0–15.0)
MCH: 26.1 pg (ref 26.0–34.0)
MCHC: 32.7 g/dL (ref 30.0–36.0)
MCV: 79.8 fL (ref 78.0–100.0)
Platelets: 281 10*3/uL (ref 150–400)
RBC: 4.06 MIL/uL (ref 3.87–5.11)
RDW: 13.5 % (ref 11.5–15.5)
WBC: 13.8 10*3/uL — ABNORMAL HIGH (ref 4.0–10.5)

## 2011-02-08 LAB — EAR CULTURE
Culture: NO GROWTH
Special Requests: NORMAL

## 2011-02-08 LAB — HEMOGLOBIN A1C
Hgb A1c MFr Bld: 8.1 % — ABNORMAL HIGH
Mean Plasma Glucose: 186 mg/dL — ABNORMAL HIGH

## 2011-02-08 MED ORDER — CLONIDINE HCL 0.1 MG PO TABS
0.1000 mg | ORAL_TABLET | Freq: Three times a day (TID) | ORAL | Status: DC | PRN
  Administered 2011-02-09 – 2011-02-10 (×3): 0.1 mg via ORAL
  Filled 2011-02-08 (×4): qty 1

## 2011-02-08 MED ORDER — POTASSIUM CHLORIDE 10 MEQ/100ML IV SOLN
10.0000 meq | Freq: Once | INTRAVENOUS | Status: AC
  Administered 2011-02-08: 10 meq via INTRAVENOUS
  Filled 2011-02-08: qty 100

## 2011-02-08 MED ORDER — ACETAMINOPHEN 325 MG PO TABS
650.0000 mg | ORAL_TABLET | Freq: Four times a day (QID) | ORAL | Status: DC | PRN
  Administered 2011-02-08 – 2011-02-12 (×3): 650 mg via ORAL
  Filled 2011-02-08 (×3): qty 2

## 2011-02-08 MED ORDER — POTASSIUM CHLORIDE CRYS ER 20 MEQ PO TBCR
40.0000 meq | EXTENDED_RELEASE_TABLET | Freq: Once | ORAL | Status: AC
  Administered 2011-02-08: 40 meq via ORAL
  Filled 2011-02-08: qty 2

## 2011-02-08 MED ORDER — AMLODIPINE BESYLATE 5 MG PO TABS
5.0000 mg | ORAL_TABLET | Freq: Once | ORAL | Status: AC
  Administered 2011-02-08: 5 mg via ORAL
  Filled 2011-02-08: qty 1

## 2011-02-08 NOTE — Progress Notes (Signed)
Physical Therapy Evaluation Patient Details Name: Cindy Wong MRN: [id number] DOB: [birth year] Today's Date: 02/08/2011 Time: 900-931 Charge: EVII  Problem List:  Patient Active Problem List  Diagnoses  . BLOOD IN STOOL  . 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 Chronic mastoiditis of right side  . Fibroid  . Diabetes mellitus  . Cancer  . Hypertension    Past Medical History:  Past Medical History  Diagnosis Date  . Fibroid   . Diabetes mellitus   . Cancer     Breast cancer-Stomach cancer  . Hypertension    Past Surgical History:  Past Surgical History  Procedure Date  . Cholecystectomy   . Abdominal hysterectomy 1974    Supracervical  . Stomach tumor   . Breast surgery 1993    Lumpectomy-Mastectomy-Right    PT Assessment/Plan/Recommendation PT Assessment Clinical Impression Statement: Pt would benefit from acute PT services in order to improve independence with mobility including ambulation and stairs to prepare for safe d/c home with nephew to assist PRN.  Pt reports feeling overall very weak.  Pt encouraged to ambulate as tolerated in hallway with staff. PT Recommendation/Assessment: Patient will need skilled PT in the acute care venue PT Problem List: Decreased strength;Decreased activity tolerance;Decreased mobility;Decreased knowledge of use of DME PT Therapy Diagnosis : Difficulty walking;Generalized weakness PT Plan PT Frequency: Min 3X/week PT Treatment/Interventions: Gait training;DME instruction;Stair training;Functional mobility training;Therapeutic activities;Therapeutic exercise;Patient/family education PT Recommendation Recommendations for Other Services:  (seen with OT) Follow Up Recommendations: Home health PT Equipment Recommended: Rolling walker with 5" wheels PT Goals  Acute Rehab PT Goals PT Goal Formulation: With patient Time For Goal Achievement: 7 days Pt will go Supine/Side to Sit: with modified independence PT Goal: Supine/Side to Sit -  Progress: Not met Pt will go Sit to Stand: with modified independence PT Goal: Sit to Stand - Progress: Not met Pt will Ambulate: >150 feet;with modified independence;with least restrictive assistive device PT Goal: Ambulate - Progress: Not met Pt will Go Up / Down Stairs: 1-2 stairs;with supervision;with least restrictive assistive device PT Goal: Up/Down Stairs - Progress: Not met Pt will Perform Home Exercise Program: with supervision, verbal cues required/provided PT Goal: Perform Home Exercise Program - Progress: Not met  PT Evaluation Precautions/Restrictions  Precautions Precautions: Fall Prior Functioning  Home Living Lives With:  (nephew living with her) Receives Help From: Family Type of Home: House Home Layout: One level Home Access: Stairs to enter Entrance Stairs-Rails: None Entrance Stairs-Number of Steps: 1 Bathroom Toilet: Standard Home Adaptive Equipment: None Prior Function Level of Independence: Independent with basic ADLs;Independent with gait;Independent with transfers Cognition Cognition Arousal/Alertness: Awake/alert Overall Cognitive Status: Appears within functional limits for tasks assessed Sensation/Coordination Sensation Light Touch: Appears Intact Extremity Assessment RLE Strength RLE Overall Strength Comments: grossly 4-/5 throughout LLE Strength LLE Overall Strength Comments: grossly 4-/5 throughout Mobility (including Balance) Bed Mobility Bed Mobility: No Transfers Transfers: Yes Sit to Stand: 4: Min assist;With upper extremity assist;From chair/3-in-1;With armrests;From toilet Sit to Stand Details (indicate cue type and reason): pt was minA upon first sit to stand, performed multiple times 2* going back and forth from recliner to toilet, pt became min/guard with last transfer, verbal cues for hand placement Stand to Sit: 4: Min assist;With upper extremity assist;To toilet;To chair/3-in-1;With armrests Stand to Sit Details: minA to  control descent upon first transfer, later became min/guard, verbal cues to bring RW back up to sitting surface, verbal cues for hand placement Ambulation/Gait Ambulation/Gait: Yes Ambulation/Gait Assistance: 4: Min assist Ambulation/Gait  Assistance Details (indicate cue type and reason): min/guard, verbal cue for safe RW distance, performed 12 feet x4 back and forth in room to/from recliner to toilet. Ambulation Distance (Feet): 48 Feet (total) Assistive device: Rolling walker Gait Pattern: Step-through pattern;Decreased stride length    Exercise    End of Session PT - End of Session Activity Tolerance: Patient tolerated treatment well Patient left: in chair;with call bell in reach General Behavior During Session: [facility] for tasks performed Cognition: [facility] for tasks performed  Gabreil [place],KATHrine E 02/08/2011, 10:44 AM Pager: [phone]

## 2011-02-08 NOTE — Progress Notes (Addendum)
Patient ID: JOHNDA SINN, female   DOB: 09-08-1931, 77 y.o.   MRN: [id number] Subjective: Two soft stools in last 24 hrs. When lies down after being up for a while has a bit of a headache. No cp, no sob.  Objective: Vital signs in last 24 hours: Temp:  [97.6 F (36.4 C)-99.3 F (37.4 C)] 99.3 F (37.4 C) (12/20 0549) Pulse Rate:  [56-65] 56  (12/20 0549) Resp:  [16-18] 16  (12/20 0549) BP: (166-197)/(74-83) 190/77 mmHg (12/20 0549) SpO2:  [93 %-96 %] 93 % (12/20 0549) Weight change:  Last BM Date: 02/06/11  Intake/Output from previous day: 12/19 0701 - 12/20 0700 In: 480 [P.O.:480] Out: 750 [Urine:750] Intake/Output this shift:    General appearance: alert, cooperative and appears stated age Resp: clear to auscultation bilaterally Cardio: regular rate and rhythm, S1, S2 normal, no murmur, click, rub or gallop GI: soft, non-tender; bowel sounds normal; no masses,  no organomegaly Extremities: extremities normal, atraumatic, no cyanosis or edema Neurologic: Grossly normal   Lab Results:  Basename 02/08/11 0635 02/07/11 0540  WBC 13.8* 16.1*  HGB 10.6* 10.3*  HCT 32.4* 32.1*  PLT 281 272   BMET  Basename 02/08/11 0635 02/07/11 0540  NA 144 147*  K 2.9* 3.0*  CL 112 117*  CO2 24 23  GLUCOSE 150* 168*  BUN 19 31*  CREATININE 0.88 1.08  CALCIUM 7.8* 7.3*   CMET CMP     Component Value Date/Time   NA 144 02/08/2011 0635   K 2.9* 02/08/2011 0635   CL 112 02/08/2011 0635   CO2 24 02/08/2011 0635   GLUCOSE 150* 02/08/2011 0635   BUN 19 02/08/2011 0635   CREATININE 0.88 02/08/2011 0635   CALCIUM 7.8* 02/08/2011 0635   PROT 5.6* 02/08/2011 0635   ALBUMIN 2.2* 02/08/2011 0635   AST 15 02/08/2011 0635   ALT 20 02/08/2011 0635   ALKPHOS 46 02/08/2011 0635   BILITOT 0.2* 02/08/2011 0635   GFRNONAA 62* 02/08/2011 0635   GFRAA 72* 02/08/2011 0635      CBG (last 3)   Basename 02/07/11 2147 02/07/11 1706 02/07/11 1234  GLUCAP 167* 213* 146*       Studies/Results: No results found.  Medications:  Have reviewed meds Assessment/Plan:  Principal Problem:  *Meningitis due to bacterium- cont. Rocephin, replace K  . Chronic mastoiditis of right side-on meds for this  . Fibroid-follow  . Diabetes mellitus-on regimen  . Cancer-nedz  . Hypertension-stable.  She has right subclavian central line. Had mastectomy on right. Has some left hand swelling. So , leave central line in a few more days  (or for the course of the rocephin vs. Trying to get periph line in left ue).   LOS: 5 days   Jerlyn Ly, MD 02/08/2011, 8:03 AM

## 2011-02-08 NOTE — Progress Notes (Signed)
Patient ID: Cindy Wong, female   DOB: 03-04-1931, 77 y.o.   MRN: [id number]  INFECTIOUS DISEASE PROGRESS NOTE    Date of Admission:  02/03/2011   Total days of antibiotics 5         Principal Problem:  *Meningitis due to bacterium Active Problems:  Chronic mastoiditis of right side      . amLODipine  5 mg Oral Once  . cefTRIAXone (ROCEPHIN)  IV  2 g Intravenous Q12H  . ciprofloxacin-dexamethasone  4 drop Right Ear BID  . enoxaparin (LOVENOX) injection  40 mg Subcutaneous Q24H  . Flora-Q  1 capsule Oral Daily  . insulin aspart  0-15 Units Subcutaneous TID WC  . insulin glargine  5 Units Subcutaneous QHS  . lisinopril  20 mg Oral Daily  . pantoprazole  40 mg Oral Q1200  . potassium chloride  10 mEq Oral Daily  . potassium chloride  10 mEq Intravenous Once  . potassium chloride  40 mEq Oral Once  . sertraline  50 mg Oral Daily  . simvastatin  20 mg Oral q1800  . DISCONTD: triamterene-hydrochlorothiazide  0.5 each Oral Daily    Subjective: She is feeling much better. She denies having any headache or earache.  Objective: Temp:  [98.8 F (37.1 C)-99.3 F (37.4 C)] 98.8 F (37.1 C) (12/20 1330) Pulse Rate:  [56-72] 72  (12/20 1330) Resp:  [16-18] 18  (12/20 1330) BP: (124-197)/(70-82) 124/70 mmHg (12/20 1330) SpO2:  [93 %-99 %] 99 % (12/20 1330)  General: She is alert, comfortable and eating dinner Skin: No rash Lungs: Clear Cor: Regular S1 and S2 no murmurs Her neck is supple  Lab Results Lab Results  Component Value Date   WBC 13.8* 02/08/2011   HGB 10.6* 02/08/2011   HCT 32.4* 02/08/2011   MCV 79.8 02/08/2011   PLT 281 02/08/2011    Lab Results  Component Value Date   CREATININE 0.88 02/08/2011   BUN 19 02/08/2011   NA 144 02/08/2011   K 2.9* 02/08/2011   CL 112 02/08/2011   CO2 24 02/08/2011    Lab Results  Component Value Date   ALT 20 02/08/2011   AST 15 02/08/2011   ALKPHOS 46 02/08/2011   BILITOT 0.2* 02/08/2011        Microbiology: Recent Results (from the past 240 hour(s))  CSF CULTURE     Status: Normal (Preliminary result)   Collection Time   02/03/11  5:33 PM      Component Value Range Status Comment   Specimen Description CSF   Final    Special Requests NONE   Final    Gram Stain     Final    Value: CYTOSPIN WBC PRESENT, PREDOMINANTLY PMN     GRAM NEGATIVE RODS     Gram Stain Report Called to,Read Back By and Verified With: Gram Stain Report Called to,Read Back By and Verified With: ANDERSONS/[id number]/MURPHYD Performed at [facility]   Culture     Final    Value: FEW HAEMOPHILUS INFLUENZAE     Note: BETA LACTAMASE NEGATIVE Referred to [facility] in [place], [place] for Serotyping. CRITICAL RESULT CALLED TO, READ BACK BY AND VERIFIED WITH: DR [facility] Vadie Principato @ 1410 02/05/11 WICKN   Report Status PENDING   Incomplete   PATHOLOGIST SMEAR REVIEW     Status: Normal   Collection Time   02/03/11  5:33 PM      Component Value Range Status Comment   Tech Review  Reviewed By Violet Baldy, M.D.   Final   CULTURE, BLOOD (ROUTINE X 2)     Status: Normal (Preliminary result)   Collection Time   02/03/11  7:30 PM      Component Value Range Status Comment   Specimen Description BLOOD RIGHT HAND   Final    Special Requests BOTTLES DRAWN AEROBIC AND ANAEROBIC 5CC EACH   Final    Setup Time [id number]   Final    Culture     Final    Value:        BLOOD CULTURE RECEIVED NO GROWTH TO DATE CULTURE WILL BE HELD FOR 5 DAYS BEFORE ISSUING A FINAL NEGATIVE REPORT   Report Status PENDING   Incomplete   CULTURE, BLOOD (ROUTINE X 2)     Status: Normal (Preliminary result)   Collection Time   02/03/11  7:39 PM      Component Value Range Status Comment   Specimen Description BLOOD LEFT HAND   Final    Special Requests BOTTLES DRAWN AEROBIC ONLY 5CC   Final    Setup Time [id number]   Final    Culture     Final    Value:        BLOOD CULTURE RECEIVED NO GROWTH TO DATE  CULTURE WILL BE HELD FOR 5 DAYS BEFORE ISSUING A FINAL NEGATIVE REPORT   Report Status PENDING   Incomplete   GRAM STAIN     Status: Normal   Collection Time   02/03/11  7:48 PM      Component Value Range Status Comment   Specimen Description CSF   Final    Special Requests NONE   Final    Gram Stain     Final    Value: WBC PRESENT, PREDOMINANTLY PMN     GRAM NEGATIVE RODS     Gram Stain Report Called to,Read Back By and Verified With: ANDERSONS/[id number]/MURPHYD   Report Status 02/03/2011 FINAL   Final   MRSA PCR SCREENING     Status: Normal   Collection Time   02/03/11 10:18 PM      Component Value Range Status Comment   MRSA by PCR NEGATIVE  NEGATIVE  Final   CULTURE, RESPIRATORY     Status: Normal   Collection Time   02/03/11 11:45 PM      Component Value Range Status Comment   Specimen Description SPUTUM   Final    Special Requests NONE   Final    Gram Stain     Final    Value: ABUNDANT WBC PRESENT,BOTH PMN AND MONONUCLEAR     NO SQUAMOUS EPITHELIAL CELLS SEEN     NO ORGANISMS SEEN   Culture NORMAL OROPHARYNGEAL FLORA   Final    Report Status 02/07/2011 FINAL   Final   EAR CULTURE     Status: Normal   Collection Time   02/05/11 12:40 PM      Component Value Range Status Comment   Specimen Description EAR   Final    Special Requests Normal   Final    Culture NO GROWTH 2 DAYS   Final    Report Status 02/08/2011 FINAL   Final     Studies/Results: No results found.   Assessment: She is responding rapidly to her therapy for her Haemophilus influenzae meningitis, otitis media and right mastoiditis. I will give her 2 more days of IV ceftriaxone to complete one week of therapy and then switch over to oral amoxicillin and treat 2  more weeks for her otitis media and mastoiditis.  Plan: 1. Continue ceftriaxone 2 more days and then switched to oral amoxicillin.   Michel Bickers, MD [facility] for [facility] [phone] pager    [phone] cell 02/08/2011, 5:06 PM

## 2011-02-08 NOTE — Progress Notes (Signed)
Occupational Therapy Evaluation Patient Details Name: Cindy Wong MRN: [id number] DOB: Apr 01, 1931 Today's Date: 02/08/2011 Time: 9:00-9:32am Ev II; 1 [place], 1 Indirect Problem List:  Patient Active Problem List  Diagnoses  . BLOOD IN STOOL  . 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 Chronic mastoiditis of right side  . Fibroid  . Diabetes mellitus  . Cancer  . Hypertension    Past Medical History:  Past Medical History  Diagnosis Date  . Fibroid   . Diabetes mellitus   . Cancer     Breast cancer-Stomach cancer  . Hypertension    Past Surgical History:  Past Surgical History  Procedure Date  . Cholecystectomy   . Abdominal hysterectomy 1974    Supracervical  . Stomach tumor   . Breast surgery 1993    Lumpectomy-Mastectomy-Right    OT Assessment/Plan/Recommendation OT Assessment Clinical Impression Statement: Pt presents w/ overall decreased activity tolerance after recent illness and is Mod I to Min A ADL's and selfcare tasks. She should benefit from acute OT followed by ?HHOT, will cont to monitor & assess depending on pt progress. OT Recommendation/Assessment: Patient will need skilled OT in the acute care venue OT Problem List: Decreased activity tolerance;Decreased strength;Decreased knowledge of use of DME or AE OT Therapy Diagnosis : Generalized weakness OT Plan OT Frequency: Min 2X/week OT Treatment/Interventions: Self-care/ADL training;DME and/or AE instruction;Therapeutic activities;Patient/family education OT Recommendation Follow Up Recommendations: Home health OT Equipment Recommended: 3 in 1 bedside comode Individuals Consulted Consulted and Agree with Results and Recommendations: Patient OT Goals Acute Rehab OT Goals OT Goal Formulation: With patient ADL Goals Pt Will Perform Grooming: with modified independence;Standing at sink ADL Goal: Grooming - Progress: Progressing toward goals Pt Will Perform Upper Body Dressing: with modified  independence;Unsupported;Sitting, chair;Sitting, bed ADL Goal: Upper Body Dressing - Progress: Progressing toward goals Pt Will Perform Lower Body Dressing: with modified independence;Sit to stand from chair;Sit to stand from bed;Unsupported;with adaptive equipment ADL Goal: Lower Body Dressing - Progress: Progressing toward goals Pt Will Perform Tub/Shower Transfer: Tub transfer;with supervision;with modified independence;Ambulation;with DME ADL Goal: Tub/Shower Transfer - Progress: Progressing toward goals Additional ADL Goal #1: Pt will perform all aspects of toileting w/ Mod I using DME & 3:1 over toilet ADL Goal: Additional Goal #1 - Progress: Progressing toward goals  OT Evaluation Precautions/Restrictions  Precautions Precautions: Fall Restrictions Weight Bearing Restrictions: No Prior Functioning Home Living Lives With: Other (Comment) (nephew living with her) Receives Help From: Family Type of Home: House Home Layout: One level Home Access: Stairs to enter Entrance Stairs-Rails: None Entrance Stairs-Number of Steps: 1 Bathroom Toilet: Standard Home Adaptive Equipment: None Prior Function Level of Independence: Independent with basic ADLs;Independent with gait;Independent with transfers ADL ADL Eating/Feeding: Performed;Modified independent Where Assessed - Eating/Feeding: Chair;Bed level Grooming: Performed;Wash/dry hands;Minimal assistance;Supervision/safety Where Assessed - Grooming: Standing at sink Upper Body Bathing: Simulated;Modified independent Where Assessed - Upper Body Bathing: Sitting, chair;Supported Lower Body Bathing: Simulated;Minimal assistance Where Assessed - Lower Body Bathing: Sitting, chair Upper Body Dressing: Performed;Set up Upper Body Dressing Details (indicate cue type and reason): Secondary to IV/Lines Where Assessed - Upper Body Dressing: Sitting, chair;Unsupported Lower Body Dressing: Simulated;Minimal assistance Lower Body Dressing  Details (indicate cue type and reason): Don/doff socks Where Assessed - Lower Body Dressing: Sitting, chair Toilet Transfer: Performed;Minimal assistance;Other (comment);Supervision/safety (Pt amb to bathroom x2 secondary to loose stools) Toilet Transfer Details (indicate cue type and reason): Pt was Min A first time to BR and Supervison second trip to bathroom. Toilet Transfer Method: Ambulating  Science writer: Raised toilet seat with arms (or 3-in-1 over toilet) Toileting - Clothing Manipulation: Performed;Supervision/safety Where Assessed - Toileting Clothing Manipulation: Standing;Sit to stand from 3-in-1 or toilet Toileting - Hygiene: Performed;Modified independent Where Assessed - Toileting Hygiene: Sit to stand from 3-in-1 or toilet;Sit on 3-in-1 or toilet Equipment Used: Rolling walker ADL Comments: Pt presents w/ overall decreased activity tolerance after recent illness and is Mod I to Min A ADL's and selfcare tasks. She should benefit from acute OT followed by ?HHOT, will cont to monitor & assess depending on pt progress. Vision/Perception  Vision - History Baseline Vision: Wears glasses only for reading Patient Visual Report: No change from baseline Cognition Cognition Arousal/Alertness: Awake/alert Overall Cognitive Status: Appears within functional limits for tasks assessed Orientation Level: Oriented X4 Sensation/Coordination Sensation Light Touch: Appears Intact Coordination Gross Motor Movements are Fluid and Coordinated: Yes Fine Motor Movements are Fluid and Coordinated: Yes Extremity Assessment RUE Assessment RUE Assessment: Within Functional Limits LUE Assessment LUE Assessment: Within Functional Limits Mobility  Bed Mobility Bed Mobility: No Transfers Sit to Stand: 4: Min assist;With upper extremity assist;From chair/3-in-1;With armrests;From toilet Sit to Stand Details (indicate cue type and reason): pt was minA upon first sit to stand, performed  multiple times 2* going back and forth from recliner to toilet, pt became min/guard with last transfer, verbal cues for hand placement Stand to Sit: 4: Min assist;With upper extremity assist;To toilet;To chair/3-in-1;With armrests Stand to Sit Details: minA to control descent upon first transfer, later became min/guard, verbal cues to bring RW back up to sitting surface, verbal cues for hand placement   End of Session OT - End of Session Equipment Utilized During Treatment: Other (comment) (RW, 3:1 over toilet) Activity Tolerance: Patient tolerated treatment well Patient left: in chair;with call bell in reach Nurse Communication: Mobility status for transfers;Mobility status for ambulation General Behavior During Session: [facility] for tasks performed Cognition: [facility] for tasks performed   Almyra Deforest 02/08/2011, 11:05 AM

## 2011-02-08 NOTE — Progress Notes (Signed)
Pt had BP this AM of 190/77 with an intermittent headache throughout the night. Called Dr. Dagmar Hait on call and gave patient per order 5mg  amlodipine PO. Will recheck BP and assess for change in status/symptoms. Erlene Quan [facility] 02/08/2011 6:58 AM

## 2011-02-09 DIAGNOSIS — G009 Bacterial meningitis, unspecified: Secondary | ICD-10-CM

## 2011-02-09 LAB — GLUCOSE, CAPILLARY
Glucose-Capillary: 138 mg/dL — ABNORMAL HIGH (ref 70–99)
Glucose-Capillary: 197 mg/dL — ABNORMAL HIGH (ref 70–99)
Glucose-Capillary: 204 mg/dL — ABNORMAL HIGH (ref 70–99)
Glucose-Capillary: 162 mg/dL — ABNORMAL HIGH (ref 70–99)

## 2011-02-09 LAB — DIFFERENTIAL
Basophils Absolute: 0 10*3/uL (ref 0.0–0.1)
Basophils Relative: 0 % (ref 0–1)
Eosinophils Absolute: 0.1 10*3/uL (ref 0.0–0.7)
Eosinophils Relative: 1 % (ref 0–5)
Lymphocytes Relative: 19 % (ref 12–46)
Lymphs Abs: 2.7 10*3/uL (ref 0.7–4.0)
Monocytes Absolute: 1.1 10*3/uL — ABNORMAL HIGH (ref 0.1–1.0)
Monocytes Relative: 8 % (ref 3–12)
Neutro Abs: 10.1 10*3/uL — ABNORMAL HIGH (ref 1.7–7.7)
Neutrophils Relative %: 72 % (ref 43–77)

## 2011-02-09 LAB — COMPREHENSIVE METABOLIC PANEL
ALT: 17 U/L (ref 0–35)
AST: 10 U/L (ref 0–37)
Albumin: 1.9 g/dL — ABNORMAL LOW (ref 3.5–5.2)
Alkaline Phosphatase: 39 U/L (ref 39–117)
BUN: 15 mg/dL (ref 6–23)
CO2: 23 mEq/L (ref 19–32)
Calcium: 7.8 mg/dL — ABNORMAL LOW (ref 8.4–10.5)
Chloride: 109 mEq/L (ref 96–112)
Creatinine, Ser: 0.78 mg/dL (ref 0.50–1.10)
GFR calc Af Amer: >90 mL/min (ref >90)
GFR calc non Af Amer: 79 mL/min — ABNORMAL LOW (ref >90)
Glucose, Bld: 152 mg/dL — ABNORMAL HIGH (ref 70–99)
Potassium: 2.9 mEq/L — ABNORMAL LOW (ref 3.5–5.1)
Sodium: 140 mEq/L (ref 135–145)
Total Bilirubin: 0.3 mg/dL (ref 0.3–1.2)
Total Protein: 4.9 g/dL — ABNORMAL LOW (ref 6.0–8.3)

## 2011-02-09 LAB — CBC
HCT: 31.1 % — ABNORMAL LOW (ref 36.0–46.0)
Hemoglobin: 9.9 g/dL — ABNORMAL LOW (ref 12.0–15.0)
MCH: 25.3 pg — ABNORMAL LOW (ref 26.0–34.0)
MCHC: 31.8 g/dL (ref 30.0–36.0)
MCV: 79.3 fL (ref 78.0–100.0)
Platelets: 264 10*3/uL (ref 150–400)
RBC: 3.92 MIL/uL (ref 3.87–5.11)
RDW: 13.5 % (ref 11.5–15.5)
WBC: 14 10*3/uL — ABNORMAL HIGH (ref 4.0–10.5)

## 2011-02-09 LAB — PHOSPHORUS: Phosphorus: 1.9 mg/dL — ABNORMAL LOW (ref 2.3–4.6)

## 2011-02-09 LAB — MAGNESIUM: Magnesium: 1.5 mg/dL (ref 1.5–2.5)

## 2011-02-09 MED ORDER — BISMUTH SUBSALICYLATE 262 MG/15ML PO SUSP
30.0000 mL | ORAL | Status: DC | PRN
  Filled 2011-02-09: qty 236

## 2011-02-09 MED ORDER — POTASSIUM CHLORIDE CRYS ER 20 MEQ PO TBCR
40.0000 meq | EXTENDED_RELEASE_TABLET | Freq: Once | ORAL | Status: AC
  Administered 2011-02-09: 40 meq via ORAL
  Filled 2011-02-09: qty 2

## 2011-02-09 MED ORDER — POTASSIUM CHLORIDE 10 MEQ/100ML IV SOLN
10.0000 meq | Freq: Once | INTRAVENOUS | Status: AC
  Administered 2011-02-09: 10 meq via INTRAVENOUS
  Filled 2011-02-09: qty 100

## 2011-02-09 MED ORDER — DIAZEPAM 2 MG PO TABS
2.0000 mg | ORAL_TABLET | Freq: Two times a day (BID) | ORAL | Status: DC | PRN
  Administered 2011-02-10 – 2011-02-11 (×2): 2 mg via ORAL
  Filled 2011-02-09 (×2): qty 1

## 2011-02-09 NOTE — Progress Notes (Signed)
Patient ID: Cindy Wong, female   DOB: [birth year], 77 y.o.   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 cefTRIAXone (ROCEPHIN)  IV  2 g Intravenous Q12H  . ciprofloxacin-dexamethasone  4 drop Right Ear BID  . enoxaparin (LOVENOX) injection  40 mg Subcutaneous Q24H  . Flora-Q  1 capsule Oral Daily  . insulin aspart  0-15 Units Subcutaneous TID WC  . insulin glargine  5 Units Subcutaneous QHS  . lisinopril  20 mg Oral Daily  . pantoprazole  40 mg Oral Q1200  . potassium chloride  10 mEq Oral Daily  . potassium chloride  10 mEq Intravenous Once  . potassium chloride  40 mEq Oral Once  . sertraline  50 mg Oral Daily  . simvastatin  20 mg Oral q1800    Subjective: She is feeling better. She remains tired but has a very good appetite. She has had minimal headache and tinnitus in her right ear today. She is wondering if it would be okay for her to restart the small dose of Valium that she took routinely on a daily basis at home. She's had 2 loose stools today. She's not had any abdominal pain, nausea or vomiting.  Objective: Temp:  [98.7 F (37.1 C)-99.5 F (37.5 C)] 99.5 F (37.5 C) (12/21 1348) Pulse Rate:  [55-72] 72  (12/21 1348) Resp:  [16-18] 18  (12/21 1348) BP: (147-183)/(71-80) 147/78 mmHg (12/21 1348) SpO2:  [97 %-99 %] 99 % (12/21 1348)  General: She is alert and comfortable Skin: No rash Lungs: Clear Cor: Regular S1 and S2 no murmurs Abdomen: Soft and nontender Her neck is supple.  Lab Results Lab Results  Component Value Date   WBC 14.0* 02/09/2011   HGB 9.9* 02/09/2011   HCT 31.1* 02/09/2011   MCV 79.3 02/09/2011   PLT 264 02/09/2011    Lab Results  Component Value Date   CREATININE 0.78 02/09/2011   BUN 15 02/09/2011   NA 140 02/09/2011   K 2.9* 02/09/2011   CL 109  02/09/2011   CO2 23 02/09/2011    Lab Results  Component Value Date   ALT 17 02/09/2011   AST 10 02/09/2011   ALKPHOS 39 02/09/2011   BILITOT 0.3 02/09/2011       Microbiology: Recent Results (from the past 240 hour(s))  CSF CULTURE     Status: Normal (Preliminary result)   Collection Time   02/03/11  5:33 PM      Component Value Range Status Comment   Specimen Description CSF   Final    Special Requests NONE   Final    Gram Stain     Final    Value: CYTOSPIN WBC PRESENT, PREDOMINANTLY PMN     GRAM NEGATIVE RODS     Gram Stain Report Called to,Read Back By and Verified With: Gram Stain Report Called to,Read Back By and Verified With: ANDERSONS/[id number]/[email or web address] Performed at [facility]   Culture     Final    Value: FEW HAEMOPHILUS INFLUENZAE     Note: BETA LACTAMASE NEGATIVE Referred to [facility] in [place] for Serotyping. CRITICAL RESULT CALLED TO, READ BACK BY AND VERIFIED WITH: DR [facility] Devinne Epstein @ 1410 02/05/11 WICKN   Report Status PENDING  Incomplete   PATHOLOGIST SMEAR REVIEW     Status: Normal   Collection Time   02/03/11  5:33 PM      Component Value Range Status Comment   Tech Review Reviewed By Violet Baldy, M.D.   Final   CULTURE, BLOOD (ROUTINE X 2)     Status: Normal (Preliminary result)   Collection Time   02/03/11  7:30 PM      Component Value Range Status Comment   Specimen Description BLOOD RIGHT HAND   Final    Special Requests BOTTLES DRAWN AEROBIC AND ANAEROBIC 5CC EACH   Final    Setup Time [id number]   Final    Culture     Final    Value:        BLOOD CULTURE RECEIVED NO GROWTH TO DATE CULTURE WILL BE HELD FOR 5 DAYS BEFORE ISSUING A FINAL NEGATIVE REPORT   Report Status PENDING   Incomplete   CULTURE, BLOOD (ROUTINE X 2)     Status: Normal (Preliminary result)   Collection Time   02/03/11  7:39 PM      Component Value Range Status Comment   Specimen Description BLOOD LEFT HAND   Final    Special  Requests BOTTLES DRAWN AEROBIC ONLY 5CC   Final    Setup Time [id number]   Final    Culture     Final    Value:        BLOOD CULTURE RECEIVED NO GROWTH TO DATE CULTURE WILL BE HELD FOR 5 DAYS BEFORE ISSUING A FINAL NEGATIVE REPORT   Report Status PENDING   Incomplete   GRAM STAIN     Status: Normal   Collection Time   02/03/11  7:48 PM      Component Value Range Status Comment   Specimen Description CSF   Final    Special Requests NONE   Final    Gram Stain     Final    Value: WBC PRESENT, PREDOMINANTLY PMN     GRAM NEGATIVE RODS     Gram Stain Report Called to,Read Back By and Verified With: ANDERSONS/[id number]/MURPHYD   Report Status 02/03/2011 FINAL   Final   MRSA PCR SCREENING     Status: Normal   Collection Time   02/03/11 10:18 PM      Component Value Range Status Comment   MRSA by PCR NEGATIVE  NEGATIVE  Final   CULTURE, RESPIRATORY     Status: Normal   Collection Time   02/03/11 11:45 PM      Component Value Range Status Comment   Specimen Description SPUTUM   Final    Special Requests NONE   Final    Gram Stain     Final    Value: ABUNDANT WBC PRESENT,BOTH PMN AND MONONUCLEAR     NO SQUAMOUS EPITHELIAL CELLS SEEN     NO ORGANISMS SEEN   Culture NORMAL OROPHARYNGEAL FLORA   Final    Report Status 02/07/2011 FINAL   Final   EAR CULTURE     Status: Normal   Collection Time   02/05/11 12:40 PM      Component Value Range Status Comment   Specimen Description EAR   Final    Special Requests Normal   Final    Culture NO GROWTH 2 DAYS   Final    Report Status 02/08/2011 FINAL   Final     Studies/Results: No results found.   Assessment: She continues to improve on therapy for  H. influenza otitis media, right mastoiditis and meningitis. I recommend one more day of IV ceftriaxone and then switched to oral amoxicillin 500 mg 3 times daily for 2 more weeks to complete therapy for mastoiditis.  I will have Dr. Tommy Medal [phone]) check the results of the C. difficile  PCR that was ordered this morning and followup tomorrow if it is positive. If it is negative and you need further assistance while she is here please call him this weekend.  Plan: 1. Continue ceftriaxone one more day and then switched to amoxicillin 2. Check C. difficile PCR   Michel Bickers, MD [facility] [phone] pager   [phone] cell 02/09/2011, 5:40 PM

## 2011-02-09 NOTE — Progress Notes (Signed)
Added carb modified to existing diet orders. Me

## 2011-02-09 NOTE — Progress Notes (Signed)
Patient ID: Cindy Wong, female   DOB: [birth year], 77 y.o.   MRN: [id number] Subjective: She had some headache last night. A bit stressed about a lot of visitors. O/w feeling okay. She does think she can arrange 24 help at home for a few days once she goes home.   Still having loose stools.  3 in last 24 hrs.  This a.m. Stool  Was runny.  Objective: Vital signs in last 24 hours: Temp:  [98.7 F (37.1 C)-98.8 F (37.1 C)] 98.7 F (37.1 C) (12/21 0516) Pulse Rate:  [55-72] 55  (12/21 0516) Resp:  [16-18] 16  (12/21 0516) BP: (124-183)/(70-80) 160/80 mmHg (12/21 1100) SpO2:  [97 %-99 %] 97 % (12/21 0516) Weight change:  Last BM Date: 02/08/11  Intake/Output from previous day: 12/20 0701 - 12/21 0700 In: 2403 [P.O.:720; I.V.:1383; IV Piggyback:300] Out: 550 [Urine:550] Intake/Output this shift: Total I/O In: 240 [P.O.:240] Out: 400 [Urine:400]  General appearance: alert, cooperative and appears stated age Resp: clear to auscultation bilaterally Cardio: regular rate and rhythm, S1, S2 normal, no murmur, click, rub or gallop GI: soft, non-tender; bowel sounds normal; no masses,  no organomegaly Extremities: extremities normal, atraumatic, no cyanosis or edema Neurologic: Grossly normal Sitting in chair.  Converses normally.  Lab Results:  Basename 02/09/11 0512 02/08/11 0635  WBC 14.0* 13.8*  HGB 9.9* 10.6*  HCT 31.1* 32.4*  PLT 264 281   BMET  Basename 02/09/11 0512 02/08/11 0635  NA 140 144  K 2.9* 2.9*  CL 109 112  CO2 23 24  GLUCOSE 152* 150*  BUN 15 19  CREATININE 0.78 0.88  CALCIUM 7.8* 7.8*   CMET CMP     Component Value Date/Time   NA 140 02/09/2011 0512   K 2.9* 02/09/2011 0512   CL 109 02/09/2011 0512   CO2 23 02/09/2011 0512   GLUCOSE 152* 02/09/2011 0512   BUN 15 02/09/2011 0512   CREATININE 0.78 02/09/2011 0512   CALCIUM 7.8* 02/09/2011 0512   PROT 4.9* 02/09/2011 0512   ALBUMIN 1.9* 02/09/2011 0512   AST 10 02/09/2011 0512   ALT 17  02/09/2011 0512   ALKPHOS 39 02/09/2011 0512   BILITOT 0.3 02/09/2011 0512   GFRNONAA 79* 02/09/2011 0512   GFRAA >90 02/09/2011 0512     Studies/Results: No results found.  Medications: I have reviewed the patient's current medications.  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  Persistent hypokalemia,   May be from diarrhea. Check mag and phos levels and treat with kaopectate and send stool for c.diff colitis (although loose stool may just be from the rocephin as a side effect).  I would have low threshold to cover for c. Diff colitis if 3 or more loose stools continue on a daily basis.  Otherwise the plan is to finish course of iv rocephin and then on Sunday go home on two weeks of oral amoxicillin. Active Problems:  Chronic mastoiditis of right side-see above Hypokalemia-see above Diabetes type 2 stable. Htn- she is running high but i have the triam/hctz on hold due to low potassium levels. Prn clonidine as needed and continue to monitor bp.    LOS: 6 days   Jerlyn Ly, MD 02/09/2011, 11:12 AM

## 2011-02-10 LAB — COMPREHENSIVE METABOLIC PANEL
ALT: 17 U/L (ref 0–35)
AST: 11 U/L (ref 0–37)
Albumin: 1.9 g/dL — ABNORMAL LOW (ref 3.5–5.2)
Alkaline Phosphatase: 41 U/L (ref 39–117)
BUN: 12 mg/dL (ref 6–23)
CO2: 25 mEq/L (ref 19–32)
Calcium: 7.8 mg/dL — ABNORMAL LOW (ref 8.4–10.5)
Chloride: 108 mEq/L (ref 96–112)
Creatinine, Ser: 0.76 mg/dL (ref 0.50–1.10)
GFR calc Af Amer: >90 mL/min (ref >90)
GFR calc non Af Amer: 79 mL/min — ABNORMAL LOW (ref >90)
Glucose, Bld: 163 mg/dL — ABNORMAL HIGH (ref 70–99)
Potassium: 3.3 mEq/L — ABNORMAL LOW (ref 3.5–5.1)
Sodium: 140 mEq/L (ref 135–145)
Total Bilirubin: 0.2 mg/dL — ABNORMAL LOW (ref 0.3–1.2)
Total Protein: 4.9 g/dL — ABNORMAL LOW (ref 6.0–8.3)

## 2011-02-10 LAB — CBC
HCT: 31.5 % — ABNORMAL LOW (ref 36.0–46.0)
Hemoglobin: 10.3 g/dL — ABNORMAL LOW (ref 12.0–15.0)
MCH: 26.1 pg (ref 26.0–34.0)
MCHC: 32.7 g/dL (ref 30.0–36.0)
MCV: 79.9 fL (ref 78.0–100.0)
Platelets: 281 10*3/uL (ref 150–400)
RBC: 3.94 MIL/uL (ref 3.87–5.11)
RDW: 13.4 % (ref 11.5–15.5)
WBC: 16.8 10*3/uL — ABNORMAL HIGH (ref 4.0–10.5)

## 2011-02-10 LAB — CULTURE, BLOOD (ROUTINE X 2)
Culture  Setup Time: 201212160136
Culture  Setup Time: 201212160136
Culture: NO GROWTH
Culture: NO GROWTH

## 2011-02-10 LAB — GLUCOSE, CAPILLARY
Glucose-Capillary: 139 mg/dL — ABNORMAL HIGH (ref 70–99)
Glucose-Capillary: 148 mg/dL — ABNORMAL HIGH (ref 70–99)
Glucose-Capillary: 201 mg/dL — ABNORMAL HIGH (ref 70–99)

## 2011-02-10 LAB — DIFFERENTIAL
Basophils Absolute: 0 10*3/uL (ref 0.0–0.1)
Basophils Relative: 0 % (ref 0–1)
Eosinophils Absolute: 0.3 10*3/uL (ref 0.0–0.7)
Eosinophils Relative: 2 % (ref 0–5)
Lymphocytes Relative: 17 % (ref 12–46)
Lymphs Abs: 2.9 10*3/uL (ref 0.7–4.0)
Monocytes Absolute: 1.2 10*3/uL — ABNORMAL HIGH (ref 0.1–1.0)
Monocytes Relative: 7 % (ref 3–12)
Neutro Abs: 12.4 10*3/uL — ABNORMAL HIGH (ref 1.7–7.7)
Neutrophils Relative %: 74 % (ref 43–77)

## 2011-02-10 LAB — CLOSTRIDIUM DIFFICILE BY PCR: Toxigenic C. Difficile by PCR: NEGATIVE

## 2011-02-10 MED ORDER — SPIRONOLACTONE 25 MG PO TABS
25.0000 mg | ORAL_TABLET | Freq: Once | ORAL | Status: AC
  Administered 2011-02-10: 25 mg via ORAL
  Filled 2011-02-10: qty 1

## 2011-02-10 MED ORDER — AMLODIPINE BESYLATE 5 MG PO TABS
5.0000 mg | ORAL_TABLET | Freq: Every day | ORAL | Status: DC
  Administered 2011-02-10 – 2011-02-12 (×3): 5 mg via ORAL
  Filled 2011-02-10 (×3): qty 1

## 2011-02-10 MED ORDER — OLMESARTAN MEDOXOMIL 40 MG PO TABS
40.0000 mg | ORAL_TABLET | Freq: Every day | ORAL | Status: DC
  Administered 2011-02-10 – 2011-02-12 (×3): 40 mg via ORAL
  Filled 2011-02-10 (×3): qty 1

## 2011-02-10 MED ORDER — ENSURE IMMUNE HEALTH PO LIQD
237.0000 mL | Freq: Three times a day (TID) | ORAL | Status: DC
  Administered 2011-02-11 (×2): 237 mL via ORAL

## 2011-02-10 NOTE — Progress Notes (Signed)
Subjective: She continues to have a mild bifrontal headache. Her diarrhea has decreased somewhat. Her appetite has been reasonable. She denies shortness of breath or cough or abdominal pain.  Objective: Vital signs in last 24 hours: Temp:  [98.3 F (36.8 C)-99.7 F (37.6 C)] 99.2 F (37.3 C) (12/22 1400) Pulse Rate:  [54-65] 63  (12/22 1400) Resp:  [16-18] 18  (12/22 1400) BP: (184-192)/(69-80) 184/69 mmHg (12/22 1400) SpO2:  [98 %-100 %] 98 % (12/22 1400) Weight change:    Intake/Output from previous day: 12/21 0701 - 12/22 0700 In: 1060 [P.O.:480; I.V.:480; IV Piggyback:100] Out: 400 [Urine:400]   General appearance: alert, cooperative and no distress Neck: no adenopathy, no carotid bruit, no JVD, supple, symmetrical, trachea midline and thyroid not enlarged, symmetric, no tenderness/mass/nodules Resp: clear to auscultation bilaterally Cardio: regular rate and rhythm Extremities: extremities normal, atraumatic, no cyanosis or edema Neurologic: Mental status: Alert, oriented, thought content appropriate  Lab Results:  Basename 02/10/11 0545 02/09/11 0512  WBC 16.8* 14.0*  HGB 10.3* 9.9*  HCT 31.5* 31.1*  PLT 281 264   BMET  Basename 02/10/11 0545 02/09/11 0512  NA 140 140  K 3.3* 2.9*  CL 108 109  CO2 25 23  GLUCOSE 163* 152*  BUN 12 15  CREATININE 0.76 0.78  CALCIUM 7.8* 7.8*   CMET CMP     Component Value Date/Time   NA 140 02/10/2011 0545   K 3.3* 02/10/2011 0545   CL 108 02/10/2011 0545   CO2 25 02/10/2011 0545   GLUCOSE 163* 02/10/2011 0545   BUN 12 02/10/2011 0545   CREATININE 0.76 02/10/2011 0545   CALCIUM 7.8* 02/10/2011 0545   PROT 4.9* 02/10/2011 0545   ALBUMIN 1.9* 02/10/2011 0545   AST 11 02/10/2011 0545   ALT 17 02/10/2011 0545   ALKPHOS 41 02/10/2011 0545   BILITOT 0.2* 02/10/2011 0545   GFRNONAA 79* 02/10/2011 0545   GFRAA >90 02/10/2011 0545    CBG (last 3)   Basename 02/10/11 1201 02/10/11 0823 02/09/11 2159  GLUCAP 148* 139*  162*    INR RESULTS:   No results found for this basename: INR, PROTIME     Studies/Results: No results found.  Medications: I have reviewed the patient's current medications.  Assessment/Plan: #1 Headache: Unclear if this is from slowly resolving meningitis versus hypertension. I will change her blood pressure medications and substitute Benicar 40 mg with amlodipine 5 mg for her lisinopril. #2 Hypokalemia: Slightly improved. The etiology is likely from her diarrhea. We will give 1 dose of Aldactone 25 mg and follow her potassium levels closely. #3 Diabetes Mellitus, Type II: Well controlled on current medications. #4 Hypoalbuminemia: Quite severe and most likely from her acute infection. We will add nutrition supplements to her regimen.  LOS: 7 days   Dawt Reeb G 02/10/2011, 9:04 PM

## 2011-02-11 LAB — COMPREHENSIVE METABOLIC PANEL
ALT: 22 U/L (ref 0–35)
AST: 15 U/L (ref 0–37)
Albumin: 2.1 g/dL — ABNORMAL LOW (ref 3.5–5.2)
Alkaline Phosphatase: 47 U/L (ref 39–117)
BUN: 13 mg/dL (ref 6–23)
CO2: 26 mEq/L (ref 19–32)
Calcium: 8.3 mg/dL — ABNORMAL LOW (ref 8.4–10.5)
Chloride: 109 mEq/L (ref 96–112)
Creatinine, Ser: 0.76 mg/dL (ref 0.50–1.10)
GFR calc Af Amer: >90 mL/min (ref >90)
GFR calc non Af Amer: 79 mL/min — ABNORMAL LOW (ref >90)
Glucose, Bld: 128 mg/dL — ABNORMAL HIGH (ref 70–99)
Potassium: 3.1 mEq/L — ABNORMAL LOW (ref 3.5–5.1)
Sodium: 141 mEq/L (ref 135–145)
Total Bilirubin: 0.2 mg/dL — ABNORMAL LOW (ref 0.3–1.2)
Total Protein: 5.4 g/dL — ABNORMAL LOW (ref 6.0–8.3)

## 2011-02-11 LAB — DIFFERENTIAL
Basophils Absolute: 0 10*3/uL (ref 0.0–0.1)
Basophils Relative: 0 % (ref 0–1)
Eosinophils Absolute: 0.5 10*3/uL (ref 0.0–0.7)
Eosinophils Relative: 3 % (ref 0–5)
Lymphocytes Relative: 17 % (ref 12–46)
Lymphs Abs: 2.7 10*3/uL (ref 0.7–4.0)
Monocytes Absolute: 0.9 10*3/uL (ref 0.1–1.0)
Monocytes Relative: 6 % (ref 3–12)
Neutro Abs: 11.5 10*3/uL — ABNORMAL HIGH (ref 1.7–7.7)
Neutrophils Relative %: 74 % (ref 43–77)

## 2011-02-11 LAB — CBC
HCT: 32.3 % — ABNORMAL LOW (ref 36.0–46.0)
Hemoglobin: 10.4 g/dL — ABNORMAL LOW (ref 12.0–15.0)
MCH: 25.7 pg — ABNORMAL LOW (ref 26.0–34.0)
MCHC: 32.2 g/dL (ref 30.0–36.0)
MCV: 80 fL (ref 78.0–100.0)
Platelets: 323 10*3/uL (ref 150–400)
RBC: 4.04 MIL/uL (ref 3.87–5.11)
RDW: 13.5 % (ref 11.5–15.5)
WBC: 15.6 10*3/uL — ABNORMAL HIGH (ref 4.0–10.5)

## 2011-02-11 LAB — GLUCOSE, CAPILLARY
Glucose-Capillary: 131 mg/dL — ABNORMAL HIGH (ref 70–99)
Glucose-Capillary: 118 mg/dL — ABNORMAL HIGH (ref 70–99)
Glucose-Capillary: 230 mg/dL — ABNORMAL HIGH (ref 70–99)
Glucose-Capillary: 108 mg/dL — ABNORMAL HIGH (ref 70–99)

## 2011-02-11 MED ORDER — SPIRONOLACTONE 100 MG PO TABS
100.0000 mg | ORAL_TABLET | Freq: Every day | ORAL | Status: DC
  Administered 2011-02-11: 100 mg via ORAL
  Filled 2011-02-11 (×2): qty 1

## 2011-02-11 NOTE — Progress Notes (Signed)
Physical Therapy Treatment Patient Details Name: Cindy Wong MRN: [id number] DOB: August 29, 1931 Today's Date: 02/11/2011  PT Assessment/Plan  PT - Assessment/Plan Comments on Treatment Session: The patient is getting stronger by the day.  She was able to tolerance increased activity and walk further.  Her gait speed still indicates a fall risk and she would still benefit from a RW for gait.  PT Plan: Discharge plan remains appropriate;Frequency remains appropriate PT Frequency: Min 3X/week Follow Up Recommendations: Home health PT Equipment Recommended: Rolling walker with 5" wheels PT Goals  Acute Rehab PT Goals PT Goal: Ambulate - Progress: Progressing toward goal PT Goal: Perform Home Exercise Program - Progress: Progressing toward goal  PT Treatment Precautions/Restrictions  Precautions Precautions: Fall Restrictions Weight Bearing Restrictions: No Mobility (including Balance) Transfers Stand to Sit: 5: Supervision;To bed Stand to Sit Details: supervision for safety since patient reports still feeling a litte weak compared to baseline.   Ambulation/Gait Ambulation/Gait: Yes Ambulation/Gait Assistance: 5: Supervision Ambulation/Gait Assistance Details (indicate cue type and reason): supervision for safety.  Patient holding to IV pole for balance.   Ambulation Distance (Feet): 350 Feet Assistive device:  (IV pole) Gait Pattern: Step-through pattern Gait velocity: 1.32 ft/sec, less than 1.8 indicated risk for recurrent falls    Exercise  General Exercises - Lower Extremity Long Arc Quad: AROM;Both;10 reps;Seated Hip ABduction/ADduction: AROM;Both;10 reps;Seated;Standing (adduction seated with pillow for resistance, abduction stand) Hip Flexion/Marching: AROM;Both;10 reps;Seated Toe Raises: AROM;Both;10 reps;Seated Heel Raises: AROM;Both;10 reps;Seated End of Session PT - End of Session Activity Tolerance: Patient limited by fatigue Patient left: in bed;with call bell  in reach General Behavior During Session: [facility] for tasks performed Cognition: [facility] for tasks performed  Cindy Wong B. [place], [place], DPT [phone]  02/11/2011, 11:24 AM

## 2011-02-11 NOTE — Progress Notes (Signed)
Subjective: She feels much better today. She only has a very mild headache and her energy level is improved. She is eating well. She would love to be discharged to home tomorrow.  Objective: Vital signs in last 24 hours: Temp:  [98.2 F (36.8 C)-99.2 F (37.3 C)] 98.2 F (36.8 C) (12/23 0500) Pulse Rate:  [61-63] 61  (12/23 0500) Resp:  [18-20] 20  (12/23 0500) BP: (138-187)/(65-78) 138/65 mmHg (12/23 0500) SpO2:  [97 %-98 %] 97 % (12/23 0500) Weight change:    Intake/Output from previous day: 12/22 0701 - 12/23 0700 In: 2230 [P.O.:1680; I.V.:500; IV Piggyback:50] Out: [id number] [Urine:1650]   General appearance: alert, cooperative and no distress Neck: no adenopathy, no carotid bruit, no JVD, supple, symmetrical, trachea midline and thyroid not enlarged, symmetric, no tenderness/mass/nodules Resp: clear to auscultation bilaterally Cardio: regular rate and rhythm, S1, S2 normal, no murmur, click, rub or gallop Extremities: extremities normal, atraumatic, no cyanosis or edema Neurologic: Grossly normal  Lab Results:  Basename 02/11/11 0700 02/10/11 0545  WBC 15.6* 16.8*  HGB 10.4* 10.3*  HCT 32.3* 31.5*  PLT 323 281   BMET  Basename 02/11/11 0700 02/10/11 0545  NA 141 140  K 3.1* 3.3*  CL 109 108  CO2 26 25  GLUCOSE 128* 163*  BUN 13 12  CREATININE 0.76 0.76  CALCIUM 8.3* 7.8*   CMET CMP     Component Value Date/Time   NA 141 02/11/2011 0700   K 3.1* 02/11/2011 0700   CL 109 02/11/2011 0700   CO2 26 02/11/2011 0700   GLUCOSE 128* 02/11/2011 0700   BUN 13 02/11/2011 0700   CREATININE 0.76 02/11/2011 0700   CALCIUM 8.3* 02/11/2011 0700   PROT 5.4* 02/11/2011 0700   ALBUMIN 2.1* 02/11/2011 0700   AST 15 02/11/2011 0700   ALT 22 02/11/2011 0700   ALKPHOS 47 02/11/2011 0700   BILITOT 0.2* 02/11/2011 0700   GFRNONAA 79* 02/11/2011 0700   GFRAA >90 02/11/2011 0700    CBG (last 3)   Basename 02/11/11 0740 02/10/11 2119 02/10/11 1710  GLUCAP 118* 201* 131*      INR RESULTS:   No results found for this basename: INR, PROTIME     Studies/Results: No results found.  Medications: I have reviewed the patient's current medications.  Assessment/Plan: #1 Meningitis: Doing very well on current treatment. Hopefully, we will be able to discharge her so that she can celebrate Christmas in 2 days. #2 Hypertension: Much improved with switching from lisinopril to Benicar and adding amlodipine. #3 Hypokalemia: Mild, and we will give her a one-time dose of Aldactone to try to correct this. We will recheck electrolytes in the morning. #4 Diabetes Mellitus:  Sugars reasonable on current treatment.  LOS: 8 days   Cindy Wong 02/11/2011, 12:38 PM

## 2011-02-12 LAB — COMPREHENSIVE METABOLIC PANEL
ALT: 23 U/L (ref 0–35)
AST: 15 U/L (ref 0–37)
Albumin: 2.1 g/dL — ABNORMAL LOW (ref 3.5–5.2)
Alkaline Phosphatase: 42 U/L (ref 39–117)
BUN: 12 mg/dL (ref 6–23)
CO2: 26 mEq/L (ref 19–32)
Calcium: 8.4 mg/dL (ref 8.4–10.5)
Chloride: 106 mEq/L (ref 96–112)
Creatinine, Ser: 0.74 mg/dL (ref 0.50–1.10)
GFR calc Af Amer: >90 mL/min (ref >90)
GFR calc non Af Amer: 80 mL/min — ABNORMAL LOW (ref >90)
Glucose, Bld: 150 mg/dL — ABNORMAL HIGH (ref 70–99)
Potassium: 3 mEq/L — ABNORMAL LOW (ref 3.5–5.1)
Sodium: 139 mEq/L (ref 135–145)
Total Bilirubin: 0.2 mg/dL — ABNORMAL LOW (ref 0.3–1.2)
Total Protein: 5.1 g/dL — ABNORMAL LOW (ref 6.0–8.3)

## 2011-02-12 LAB — DIFFERENTIAL
Basophils Absolute: 0 10*3/uL (ref 0.0–0.1)
Basophils Relative: 0 % (ref 0–1)
Eosinophils Absolute: 0.3 10*3/uL (ref 0.0–0.7)
Eosinophils Relative: 2 % (ref 0–5)
Lymphocytes Relative: 17 % (ref 12–46)
Lymphs Abs: 2.1 10*3/uL (ref 0.7–4.0)
Monocytes Absolute: 0.8 10*3/uL (ref 0.1–1.0)
Monocytes Relative: 6 % (ref 3–12)
Neutro Abs: 9.2 10*3/uL — ABNORMAL HIGH (ref 1.7–7.7)
Neutrophils Relative %: 74 % (ref 43–77)

## 2011-02-12 LAB — CBC
HCT: 29.3 % — ABNORMAL LOW (ref 36.0–46.0)
Hemoglobin: 9.5 g/dL — ABNORMAL LOW (ref 12.0–15.0)
MCH: 26.1 pg (ref 26.0–34.0)
MCHC: 32.4 g/dL (ref 30.0–36.0)
MCV: 80.5 fL (ref 78.0–100.0)
Platelets: 314 10*3/uL (ref 150–400)
RBC: 3.64 MIL/uL — ABNORMAL LOW (ref 3.87–5.11)
RDW: 13.6 % (ref 11.5–15.5)
WBC: 12.4 10*3/uL — ABNORMAL HIGH (ref 4.0–10.5)

## 2011-02-12 LAB — GLUCOSE, CAPILLARY
Glucose-Capillary: 271 mg/dL — ABNORMAL HIGH (ref 70–99)
Glucose-Capillary: 124 mg/dL — ABNORMAL HIGH (ref 70–99)
Glucose-Capillary: 127 mg/dL — ABNORMAL HIGH (ref 70–99)

## 2011-02-12 MED ORDER — AMOXICILLIN 500 MG PO CAPS
500.0000 mg | ORAL_CAPSULE | Freq: Three times a day (TID) | ORAL | Status: DC
  Administered 2011-02-12: 500 mg via ORAL
  Filled 2011-02-12 (×3): qty 1

## 2011-02-12 MED ORDER — AMOXICILLIN 500 MG PO CAPS: 500.0000 mg | ORAL_CAPSULE | Freq: Three times a day (TID) | ORAL | Status: AC

## 2011-02-12 MED ORDER — FLORA-Q PO CAPS: 1.0000 | ORAL_CAPSULE | Freq: Every day | ORAL | Status: DC

## 2011-02-12 MED ORDER — AMLODIPINE BESYLATE 5 MG PO TABS: 5.0000 mg | ORAL_TABLET | Freq: Every day | ORAL | Status: DC

## 2011-02-12 MED ORDER — POTASSIUM CHLORIDE CRYS ER 20 MEQ PO TBCR
40.0000 meq | EXTENDED_RELEASE_TABLET | Freq: Every day | ORAL | Status: DC
  Administered 2011-02-12: 40 meq via ORAL
  Filled 2011-02-12: qty 2

## 2011-02-12 MED ORDER — SIMVASTATIN 20 MG PO TABS: 20.0000 mg | ORAL_TABLET | Freq: Every day | ORAL | Status: DC

## 2011-02-12 MED ORDER — OLMESARTAN MEDOXOMIL 40 MG PO TABS: 40.0000 mg | ORAL_TABLET | Freq: Every day | ORAL | Status: DC

## 2011-02-12 MED ORDER — POTASSIUM CHLORIDE CRYS ER 20 MEQ PO TBCR: 40.0000 meq | EXTENDED_RELEASE_TABLET | Freq: Every day | ORAL | Status: DC

## 2011-02-12 NOTE — Progress Notes (Signed)
Occupational Therapy Evaluation Patient Details Name: Cindy Wong MRN: [id number] DOB: 04/11/1931 Today's Date: 02/12/2011 13:07-13:25  1ta Problem List:  Patient Active Problem List  Diagnoses  . BLOOD IN STOOL  . 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 Chronic mastoiditis of right side  . Fibroid  . Diabetes mellitus  . Cancer  . Hypertension    Past Medical History:  Past Medical History  Diagnosis Date  . Fibroid   . Diabetes mellitus   . Cancer     Breast cancer-Stomach cancer  . Hypertension    Past Surgical History:  Past Surgical History  Procedure Date  . Cholecystectomy   . Abdominal hysterectomy 1974    Supracervical  . Stomach tumor   . Breast surgery 1993    Lumpectomy-Mastectomy-Right    OT Assessment/Plan/Recommendation OT Recommendation Follow Up Recommendations: None Equipment Recommended: None recommended by OT OT Goals    OT Evaluation Precautions/Restrictions  Precautions Precautions: Fall Restrictions Weight Bearing Restrictions: No Prior Functioning     ADL ADL Eating/Feeding: Simulated;Independent Where Assessed - Eating/Feeding: Edge of bed Grooming: Simulated;Independent Where Assessed - Grooming: Standing at sink Upper Body Bathing: Simulated;Modified independent Where Assessed - Upper Body Bathing: Sitting, bed Lower Body Bathing: Simulated;Modified independent Where Assessed - Lower Body Bathing: Sit to stand from bed Upper Body Dressing: Simulated;Modified independent Where Assessed - Upper Body Dressing: Sitting, bed Lower Body Dressing: Simulated;Modified independent Where Assessed - Lower Body Dressing: Standing Toilet Transfer: Performed;Modified independent Toilet Transfer Method: Counselling psychologist: Regular height toilet Toileting - Clothing Manipulation: Simulated;Modified independent Where Assessed - Toileting Clothing Manipulation: Sit to stand from 3-in-1 or toilet Toileting - Hygiene:  Simulated;Modified independent Where Assessed - Toileting Hygiene: Sit to stand from 3-in-1 or toilet Tub/Shower Transfer: Performed;Modified independent Tub/Shower Transfer Method: Ambulating ADL Comments: Pt much improved since last visit.  Currently modified independent for all simulated selfcare tasks.  No follow-up OT or Dme needs. Vision/Perception    Cognition   Sensation/Coordination   Extremity Assessment   Mobility  Bed Mobility Bed Mobility: No Exercises   End of Session OT - End of Session Activity Tolerance: Patient tolerated treatment well Patient left: in chair;with call bell in reach General Behavior During Session: [facility] for tasks performed Cognition: [facility] for tasks performed   Anitra Doxtater OTR/L 02/12/2011, 1:37 PM  Pager number [phone]

## 2011-02-12 NOTE — Progress Notes (Signed)
Enteric precautions discontinued. Patient negative for c-diff

## 2011-02-12 NOTE — Discharge Summary (Addendum)
DISCHARGE SUMMARY  Cindy Wong  MR#: [id number]  DOB:Jan 16, 1932  Date of Admission: 02/03/2011 Date of Discharge: 02/12/2011  Attending Physician:Deagen Wong Cindy  Patient's [id number] W, MD, MD  Consults:Treatment Team:  Onnie Graham   Infectious disease Dr. Drucilla Schmidt Critical care medicine  Procedures: Endotracheal intubation with mechanical ventilation, lumbar puncture, CT of head, drainage tube in mastoid, central line placement  Discharge Diagnoses: Principal Problem:  *Meningitis due to bacterium,  Haemophilus influenza Active Problems:  Chronic mastoiditis of right side Patient Active Problem List  Diagnoses  .   hypokalemia, being treated   .  hyperlipidemia: Resume her therapy   .  depression, on therapy      altered mental status due to meningitis  Endotracheal intubation with mechanical ventilation for airway control   . Diabetes mellitus, type II on oral agents, resume prior therapy     gastric stromal tumor s/p resection Left lower lobe infiltrate, resolved   . Hypertension, under fair control  Protein calorie malnutrition  Normocytic anemia       Discharge Medications: Current Discharge Medication List    START taking these medications   Details  amLODipine (NORVASC) 5 MG tablet Take 1 tablet (5 mg total) by mouth daily. Qty: 30 tablet, Refills: 1    amoxicillin (AMOXIL) 500 MG capsule Take 1 capsule (500 mg total) by mouth every 8 (eight) hours. Qty: 42 capsule, Refills: 0    Flora-Q (FLORA-Q) CAPS Take 1 capsule by mouth daily. Qty: 14 capsule, Refills: 0    olmesartan (BENICAR) 40 MG tablet Take 1 tablet (40 mg total) by mouth daily. Qty: 30 tablet, Refills: 1    potassium chloride SA (K-DUR,KLOR-CON) 20 MEQ tablet Take 2 tablets (40 mEq total) by mouth daily. Qty: 14 tablet, Refills: 0    simvastatin (ZOCOR) 20 MG tablet Take 1 tablet (20 mg total) by mouth daily at 6 PM. Qty: 30 tablet, Refills: 0      CONTINUE these  medications which have NOT CHANGED   Details  diazepam (VALIUM) 5 MG tablet Take 5 mg by mouth 2 (two) times daily.      ferrous sulfate 325 (65 FE) MG tablet Take 325 mg by mouth daily with breakfast.      glipiZIDE (GLUCOTROL XL) 10 MG 24 hr tablet Take 10 mg by mouth daily.      lisinopril (PRINIVIL,ZESTRIL) 20 MG tablet Take 20 mg by mouth daily.      metFORMIN (GLUCOPHAGE) 500 MG tablet Take 500 mg by mouth 2 (two) times daily with a meal.      pravastatin (PRAVACHOL) 40 MG tablet Take 40 mg by mouth daily.      sertraline (ZOLOFT) 50 MG tablet Take 50 mg by mouth daily.        STOP taking these medications     potassium chloride (KLOR-CON) 10 MEQ CR tablet      triamterene-hydrochlorothiazide (DYAZIDE) 37.5-25 MG per capsule         Hospital Procedures: Ct Head Wo Contrast  02/03/2011  *RADIOLOGY REPORT*  Clinical Data: Slurred speech.  Left-sided facial droop.  CT HEAD WITHOUT CONTRAST  Technique:  Contiguous axial images were obtained from the base of the skull through the vertex without contrast.  Comparison: 03/12/2004  Findings: There is diffuse patchy low density throughout the subcortical and periventricular white matter consistent with chronic small vessel ischemic change.  There is prominence of the sulci and ventricles consistent with brain atrophy.  There is no evidence for acute  brain infarct, hemorrhage or mass.  The paranasal sinuses are clear.  There is opacification of the right mastoid air cells, similar to previous exam.  The left mastoid air cells are clear.  IMPRESSION:  1.  Small vessel ischemic disease and brain atrophy. 2. Chronic right mastoid air cell opacification.  Original Report Authenticated By: Angelita Ingles, M.D.   [name] Contrast  02/05/2011  *RADIOLOGY REPORT*  Clinical Data: 77 year old female with fever, bacterial meningitis, right ear infection.  Drainage tube placed.  Respiratory failure.  MRI HEAD WITHOUT CONTRAST  Technique:   Multiplanar, multiecho pulse sequences of the brain and surrounding structures were obtained according to standard protocol without intravenous contrast.  Comparison: Head CTs 02/03/2011 and earlier.  Findings: Right mastoid air cells are completely opacified.  Right tympanic cavity and external auditory canal are opacified.  There is also scattered fluid in the left mastoids.  Both petrous apices are pneumatized, with fluid worse on the right.  Negative visualized nasopharynx.  Comparatively minor paranasal sinus mucosal thickening.  Partially empty sella. Major intracranial vascular flow voids are preserved except for the distal right vertebral artery which appears chronically nondominant.  The right transverse and sigmoid sinus flow voids plus the right internal jugular bulb are preserved. No restricted diffusion to suggest acute infarction.  No midline shift, mass effect, evidence of mass lesion, ventriculomegaly, acute intracranial hemorrhage.  Cervicomedullary junction is within normal limits.  Coarse para falcine dural calcifications.  There may be trace debris layering in the atria of the lateral ventricles (series 4 image 14).  No other extra-axial collection. Gray-white matter signal is within normal limits for age throughout the brain.  Negative visualized cervical spine.  Bone marrow signal remains normal.  Visualized orbit soft tissues are within normal limits.  Negative scalp soft tissues.  IMPRESSION: 1.  Findings compatible with right otomastoiditis in this clinical setting.  No contrast was administered, but there is no overt intracranial extension (e.g. no right temporal lobe edema). 2.  Questionable trace debris layering in the lateral ventricles such as can be seen with meningitis or ventriculitis. 3. Otherwise no acute intracranial abnormality. 4.  Mild to moderate mastoid inflammatory changes also on the left. No obstructing etiology identified in the nasopharynx.  Original Report Authenticated  By: Randall An, M.D.   Dg Chest Port 1 View  02/06/2011  *RADIOLOGY REPORT*  Clinical Data: Respiratory distress.  PORTABLE CHEST - 1 VIEW  Comparison: 02/05/2011  Findings: Endotracheal tube has been removed.  Stable position of the right subclavian central line.  The catheter tip is near the junction of the left subclavian vein and left innominate vein.  No evidence for a pneumothorax.  There are increased basilar densities that may represent atelectasis and pleural fluid.  Heart size is grossly stable.  Nasogastric tube has been removed.  IMPRESSION: Removal of endotracheal tube with increased basilar densities. Findings most likely represent atelectasis but cannot exclude pleural fluid.  Stable position of the central line.  Original Report Authenticated By: Markus Daft, M.D.   Dg Chest Port 1 View  02/05/2011  *RADIOLOGY REPORT*  Clinical Data: Evaluate tube position.  PORTABLE CHEST - 1 VIEW  Comparison: 02/03/2011  Findings:  Endotracheal tube tip is located approximately 2.4 cm proximal to the carina.  An NG tube descends into the abdomen, tip not visualized.  A right subclavian central venous catheter crosses midline with tip projecting over the left brachiocephalic vein. Artifact overlies theright lower lung.  The left lower  lung is excluded from the image and a retrocardiac opacity cannot be excluded.  No pneumothorax identified. Visualized mediastinal contours are unchanged.  IMPRESSION: Unchanged support devices.  Right subclavian line again noted to cross midline.  No pneumothorax.  Original Report Authenticated By: Suanne Marker, M.D.   Dg Chest Portable 1 View  02/03/2011  *RADIOLOGY REPORT*  Clinical Data: Central line placement  PORTABLE CHEST - 1 VIEW  Comparison: 02/03/2011  Findings: Right subclavian central venous catheter crosses the midline into the left innominate vein.  No pneumothorax.  Endotracheal tube in good position.  NG tube enters the stomach.  Negative for  pneumonia or heart failure.  Mild left lower lobe atelectasis is slightly improved from the prior study.  IMPRESSION: Right subclavian central venous catheter crosses the midline into the left innominate vein.  No pneumothorax.  Original Report Authenticated By: Truett Perna, M.D.   Dg Chest Portable 1 View  02/03/2011  *RADIOLOGY REPORT*  Clinical Data: Intubation  PORTABLE CHEST - 1 VIEW  Comparison: 06/21/2009  Findings: Endotracheal tube is approximately 3 cm above the carina. NG tube in place in the stomach.  Left lower lobe airspace disease is present and may represent atelectasis or pneumonia.  Right lung is clear.  Negative for heart failure or effusion.  IMPRESSION: Endotracheal tube 3 cm above the carina.  Left lower lobe consolidation.  Original Report Authenticated By: Truett Perna, M.D.    History of Present Illness: This is a 77 year old black female where she drove herself to the hospital.  Hospital Course: Patient's family acutely L. upon arrival with altered mental status nuchal rigidity and fever. She had a prompt lumbar puncture after CT ruled out mass lesion. This was found to be grossly infected. She was prophylactically intubated and placed on mechanical ventilation. She went to the intensive care unit and was treated with broad-spectrum antibiotics, dexamethasone, and acyclovir. When the organism of Haemophilus influenza was identified, therapy was more focused. The patient is done remarkably well overall. Her mental status is returned to normal and she has no sequelae of any neurologic compromise. She is eating and drinking fine. Her speech is clear and fluent. She has no weakness or motor problems. Her blood pressure has been variable. Blood sugars are improved. She's had no fevers chills or sweats. Her vision is good. Her neck is not tender or stiff. Infectious disease physicians felt we could narrow her therapy to amoxicillin orally for additional 2 weeks. Patient's clinical  course certainly would indicate that this is reasonable. Patient's bowels are working well no diarrhea. She has no other complaints. Glucose is doing well at 124 this morning. Her estimated GFR is now back over 90 I think we can put her back on her metformin. We'll also resume the glipizide. She has been treated with insulin while in the hospital. She did have a left lower lobe infiltrate on initial x-ray but has no pulmonary symptoms at the present time. Patient does live alone but has a nephew to help her out.  Day of Discharge Exam BP 186/75  Pulse 68  Temp(Src) 98.4 F (36.9 C) (Oral)  Resp 15  Ht 5' 6.93" (1.7 m)  Wt 78.4 kg (172 lb 13.5 oz)  BMI 27.13 kg/m2  SpO2 97%  Physical Exam: General appearance: alert, we have a healthy-appearing black female sitting up in no distress. Face is symmetric. Gaze is conjugate and there is no nystagmus. There is no midline exophthalmos. Oral mucous murmurs are moist. Neck  is supple with no JVD.  Resp: clear to auscultation bilaterally, no wheezes rales or rhonchi are present. Cardio: regular rate and rhythm, heart sounds are somewhat distant. GI: soft, non-tender; bowel sounds normal; no masses,  no organomegaly Extremities: no clubbing, cyanosis or edema Neuro: The patient's awake alert and mentating well speech is clear and fluent no tremor is present grip is equal bilaterally no other neurologic issues are noted. Skin is warm and dry without rashes  Discharge Labs:  [facility] 02/12/11 0640 02/11/11 0700  NA 139 141  K 3.0* 3.1*  CL 106 109  CO2 26 26  GLUCOSE 150* 128*  BUN 12 13  CREATININE 0.74 0.76  CALCIUM 8.4 8.3*  MG -- --  PHOS -- --    Basename 02/12/11 0640 02/11/11 0700  AST 15 15  ALT 23 22  ALKPHOS 42 47  BILITOT 0.2* 0.2*  PROT 5.1* 5.4*  ALBUMIN 2.1* 2.1*    Basename 02/12/11 0640 02/11/11 0700  WBC 12.4* 15.6*  NEUTROABS 9.2* 11.5*  HGB 9.5* 10.4*  HCT 29.3* 32.3*  MCV 80.5 80.0  PLT 314 323  Results for  LATONJA, BRESSAN (MRN [id number]) as of 02/12/2011 10:54  Ref. Range 02/03/2011 17:33  Glucose, CSF Latest Range: 43-76 mg/dL 67  Total  Protein, CSF Latest Range: 15-45 mg/dL 192 (H)  RBC Count, CSF Latest Range: 0 /cu mm 230 (H)  WBC, CSF Latest Range: 0-5 /cu mm 575 (HH)  Segmented Neutrophils-CSF Latest Range: 0-6 % 97 (H)  Lymphs, CSF Latest Range: 40-80 % 2 (L)  Monocyte-Macrophage-Spinal Fluid Latest Range: 15-45 % 1 (L)  Eosinophils, CSF Latest Range: 0-1 % 0  Other Cells, CSF No range found 0  Appearance, CSF Latest Range: CLEAR  HAZY (A)  Color, CSF Latest Range: COLORLESS  COLORLESS  Supernatant No range found HAZY  Tube # No range found 4     Discharge instructions: Discharge Orders    Future Appointments: Provider: Department: Dept Phone: Center:   02/15/2011 4:00 PM Bennetta Laos, MD [facility] [phone] [place]   02/16/2011 12:20 PM [facility] [phone] [facility]      Disposition: To home  Follow-up Appts: Follow-up with Dr. Philip Aspen at [facility] in 2 weeks  Call for appointment.  Condition on Discharge: Significantly improved Diet no concentrated sweets Resume blood sugar testing is before Tests Needing Follow-up: None  Signed: Larose Batres Cindy 02/12/2011, 10:54 AM

## 2011-02-15 ENCOUNTER — Encounter: Payer: Medicare Other | Admitting: Obstetrics and Gynecology

## 2011-02-16 ENCOUNTER — Ambulatory Visit: Payer: Medicare Other

## 2011-03-02 ENCOUNTER — Ambulatory Visit
Admission: RE | Admit: 2011-03-02 | Discharge: 2011-03-02 | Disposition: A | Discharge status: Home / Self Care | Payer: Medicare Other | Source: Ambulatory Visit | Attending: Oncology | Admitting: Oncology

## 2011-03-02 DIAGNOSIS — Z1231 Encounter for screening mammogram for malignant neoplasm of breast: Secondary | ICD-10-CM

## 2011-03-05 DIAGNOSIS — E119 Type 2 diabetes mellitus without complications: Secondary | ICD-10-CM | POA: Diagnosis not present

## 2011-03-05 DIAGNOSIS — G009 Bacterial meningitis, unspecified: Secondary | ICD-10-CM | POA: Diagnosis not present

## 2011-03-05 DIAGNOSIS — E785 Hyperlipidemia, unspecified: Secondary | ICD-10-CM | POA: Diagnosis not present

## 2011-03-05 DIAGNOSIS — I10 Essential (primary) hypertension: Secondary | ICD-10-CM | POA: Diagnosis not present

## 2011-03-21 DIAGNOSIS — R262 Difficulty in walking, not elsewhere classified: Secondary | ICD-10-CM | POA: Diagnosis not present

## 2011-03-23 DIAGNOSIS — R262 Difficulty in walking, not elsewhere classified: Secondary | ICD-10-CM | POA: Diagnosis not present

## 2011-03-29 DIAGNOSIS — R262 Difficulty in walking, not elsewhere classified: Secondary | ICD-10-CM | POA: Diagnosis not present

## 2011-03-31 ENCOUNTER — Telehealth: Payer: Self-pay | Admitting: Oncology

## 2011-03-31 NOTE — Telephone Encounter (Signed)
l/m and mailed appt for 06/28/11  aom

## 2011-04-03 DIAGNOSIS — R262 Difficulty in walking, not elsewhere classified: Secondary | ICD-10-CM | POA: Diagnosis not present

## 2011-04-05 DIAGNOSIS — R262 Difficulty in walking, not elsewhere classified: Secondary | ICD-10-CM | POA: Diagnosis not present

## 2011-04-10 DIAGNOSIS — R262 Difficulty in walking, not elsewhere classified: Secondary | ICD-10-CM | POA: Diagnosis not present

## 2011-04-12 DIAGNOSIS — R262 Difficulty in walking, not elsewhere classified: Secondary | ICD-10-CM | POA: Diagnosis not present

## 2011-04-17 DIAGNOSIS — R262 Difficulty in walking, not elsewhere classified: Secondary | ICD-10-CM | POA: Diagnosis not present

## 2011-04-18 DIAGNOSIS — H9210 Otorrhea, unspecified ear: Secondary | ICD-10-CM | POA: Diagnosis not present

## 2011-04-18 DIAGNOSIS — H66009 Acute suppurative otitis media without spontaneous rupture of ear drum, unspecified ear: Secondary | ICD-10-CM | POA: Diagnosis not present

## 2011-04-18 DIAGNOSIS — H70009 Acute mastoiditis without complications, unspecified ear: Secondary | ICD-10-CM | POA: Diagnosis not present

## 2011-04-19 DIAGNOSIS — R262 Difficulty in walking, not elsewhere classified: Secondary | ICD-10-CM | POA: Diagnosis not present

## 2011-04-25 DIAGNOSIS — I10 Essential (primary) hypertension: Secondary | ICD-10-CM | POA: Diagnosis not present

## 2011-04-25 DIAGNOSIS — M949 Disorder of cartilage, unspecified: Secondary | ICD-10-CM | POA: Diagnosis not present

## 2011-04-25 DIAGNOSIS — M899 Disorder of bone, unspecified: Secondary | ICD-10-CM | POA: Diagnosis not present

## 2011-04-25 DIAGNOSIS — M546 Pain in thoracic spine: Secondary | ICD-10-CM | POA: Diagnosis not present

## 2011-05-02 DIAGNOSIS — H9210 Otorrhea, unspecified ear: Secondary | ICD-10-CM | POA: Diagnosis not present

## 2011-05-02 DIAGNOSIS — G009 Bacterial meningitis, unspecified: Secondary | ICD-10-CM | POA: Diagnosis not present

## 2011-05-02 DIAGNOSIS — H66009 Acute suppurative otitis media without spontaneous rupture of ear drum, unspecified ear: Secondary | ICD-10-CM | POA: Diagnosis not present

## 2011-05-03 ENCOUNTER — Other Ambulatory Visit: Payer: Self-pay | Admitting: Otolaryngology

## 2011-05-03 DIAGNOSIS — G009 Bacterial meningitis, unspecified: Secondary | ICD-10-CM

## 2011-05-03 DIAGNOSIS — H66009 Acute suppurative otitis media without spontaneous rupture of ear drum, unspecified ear: Secondary | ICD-10-CM

## 2011-05-03 DIAGNOSIS — H9210 Otorrhea, unspecified ear: Secondary | ICD-10-CM

## 2011-05-04 ENCOUNTER — Other Ambulatory Visit: Payer: Medicare Other

## 2011-05-04 ENCOUNTER — Ambulatory Visit
Admission: RE | Admit: 2011-05-04 | Discharge: 2011-05-04 | Disposition: A | Discharge status: Home / Self Care | Payer: Medicare Other | Source: Ambulatory Visit | Attending: Otolaryngology | Admitting: Otolaryngology

## 2011-05-04 DIAGNOSIS — H7190 Unspecified cholesteatoma, unspecified ear: Secondary | ICD-10-CM | POA: Diagnosis not present

## 2011-05-04 DIAGNOSIS — G009 Bacterial meningitis, unspecified: Secondary | ICD-10-CM

## 2011-05-04 DIAGNOSIS — H66009 Acute suppurative otitis media without spontaneous rupture of ear drum, unspecified ear: Secondary | ICD-10-CM

## 2011-05-04 DIAGNOSIS — H9210 Otorrhea, unspecified ear: Secondary | ICD-10-CM

## 2011-05-04 DIAGNOSIS — G039 Meningitis, unspecified: Secondary | ICD-10-CM | POA: Diagnosis not present

## 2011-05-08 ENCOUNTER — Ambulatory Visit
Admission: RE | Admit: 2011-05-08 | Discharge: 2011-05-08 | Disposition: A | Discharge status: Home / Self Care | Payer: Medicare Other | Source: Ambulatory Visit | Attending: Endocrinology | Admitting: Endocrinology

## 2011-05-08 ENCOUNTER — Other Ambulatory Visit: Payer: Self-pay | Admitting: Endocrinology

## 2011-05-08 DIAGNOSIS — R0789 Other chest pain: Secondary | ICD-10-CM

## 2011-05-08 DIAGNOSIS — R079 Chest pain, unspecified: Secondary | ICD-10-CM | POA: Diagnosis not present

## 2011-05-08 DIAGNOSIS — Z853 Personal history of malignant neoplasm of breast: Secondary | ICD-10-CM | POA: Diagnosis not present

## 2011-05-08 DIAGNOSIS — I517 Cardiomegaly: Secondary | ICD-10-CM | POA: Diagnosis not present

## 2011-05-22 DIAGNOSIS — H908 Mixed conductive and sensorineural hearing loss, unspecified: Secondary | ICD-10-CM | POA: Diagnosis not present

## 2011-05-22 DIAGNOSIS — H902 Conductive hearing loss, unspecified: Secondary | ICD-10-CM | POA: Diagnosis not present

## 2011-05-25 DIAGNOSIS — H9210 Otorrhea, unspecified ear: Secondary | ICD-10-CM | POA: Diagnosis not present

## 2011-05-25 DIAGNOSIS — Z8661 Personal history of infections of the central nervous system: Secondary | ICD-10-CM | POA: Diagnosis not present

## 2011-05-25 DIAGNOSIS — G988 Other disorders of nervous system: Secondary | ICD-10-CM | POA: Diagnosis not present

## 2011-06-05 DIAGNOSIS — Z23 Encounter for immunization: Secondary | ICD-10-CM | POA: Diagnosis not present

## 2011-06-08 DIAGNOSIS — I10 Essential (primary) hypertension: Secondary | ICD-10-CM | POA: Diagnosis not present

## 2011-06-18 DIAGNOSIS — G9601 Cranial cerebrospinal fluid leak, spontaneous: Secondary | ICD-10-CM | POA: Diagnosis not present

## 2011-06-18 DIAGNOSIS — Z853 Personal history of malignant neoplasm of breast: Secondary | ICD-10-CM | POA: Diagnosis not present

## 2011-06-18 DIAGNOSIS — G09 Sequelae of inflammatory diseases of central nervous system: Secondary | ICD-10-CM | POA: Diagnosis not present

## 2011-06-18 DIAGNOSIS — E878 Other disorders of electrolyte and fluid balance, not elsewhere classified: Secondary | ICD-10-CM | POA: Diagnosis present

## 2011-06-18 DIAGNOSIS — H9210 Otorrhea, unspecified ear: Secondary | ICD-10-CM | POA: Diagnosis not present

## 2011-06-18 DIAGNOSIS — I10 Essential (primary) hypertension: Secondary | ICD-10-CM | POA: Diagnosis present

## 2011-06-18 DIAGNOSIS — E119 Type 2 diabetes mellitus without complications: Secondary | ICD-10-CM | POA: Diagnosis present

## 2011-06-18 DIAGNOSIS — E785 Hyperlipidemia, unspecified: Secondary | ICD-10-CM | POA: Diagnosis present

## 2011-06-18 DIAGNOSIS — Z85028 Personal history of other malignant neoplasm of stomach: Secondary | ICD-10-CM | POA: Diagnosis not present

## 2011-06-18 DIAGNOSIS — Q019 Encephalocele, unspecified: Secondary | ICD-10-CM | POA: Diagnosis not present

## 2011-06-28 ENCOUNTER — Telehealth: Payer: Self-pay | Admitting: Oncology

## 2011-06-28 ENCOUNTER — Other Ambulatory Visit: Payer: Medicare Other | Admitting: Lab

## 2011-06-28 ENCOUNTER — Ambulatory Visit: Payer: Medicare Other | Admitting: Physician Assistant

## 2011-06-28 NOTE — Telephone Encounter (Signed)
pt l/m to cx todays appts as she had brain surg.  tried to call her back ans l/m to call when she can to r/s   aom

## 2011-07-17 DIAGNOSIS — E785 Hyperlipidemia, unspecified: Secondary | ICD-10-CM | POA: Diagnosis not present

## 2011-07-17 DIAGNOSIS — I10 Essential (primary) hypertension: Secondary | ICD-10-CM | POA: Diagnosis not present

## 2011-07-17 DIAGNOSIS — E559 Vitamin D deficiency, unspecified: Secondary | ICD-10-CM | POA: Diagnosis not present

## 2011-07-17 DIAGNOSIS — E119 Type 2 diabetes mellitus without complications: Secondary | ICD-10-CM | POA: Diagnosis not present

## 2011-07-24 DIAGNOSIS — Z0000 Encounter for general adult medical examination without abnormal findings: Secondary | ICD-10-CM | POA: Diagnosis not present

## 2011-07-24 DIAGNOSIS — I10 Essential (primary) hypertension: Secondary | ICD-10-CM | POA: Diagnosis not present

## 2011-07-24 DIAGNOSIS — C50919 Malignant neoplasm of unspecified site of unspecified female breast: Secondary | ICD-10-CM | POA: Diagnosis not present

## 2011-07-24 DIAGNOSIS — E119 Type 2 diabetes mellitus without complications: Secondary | ICD-10-CM | POA: Diagnosis not present

## 2011-07-25 DIAGNOSIS — Z1212 Encounter for screening for malignant neoplasm of rectum: Secondary | ICD-10-CM | POA: Diagnosis not present

## 2011-08-02 DIAGNOSIS — M79609 Pain in unspecified limb: Secondary | ICD-10-CM | POA: Diagnosis not present

## 2011-08-02 DIAGNOSIS — B351 Tinea unguium: Secondary | ICD-10-CM | POA: Diagnosis not present

## 2011-08-02 DIAGNOSIS — L600 Ingrowing nail: Secondary | ICD-10-CM | POA: Diagnosis not present

## 2011-08-03 DIAGNOSIS — Z09 Encounter for follow-up examination after completed treatment for conditions other than malignant neoplasm: Secondary | ICD-10-CM | POA: Diagnosis not present

## 2011-08-03 DIAGNOSIS — G988 Other disorders of nervous system: Secondary | ICD-10-CM | POA: Diagnosis not present

## 2011-08-08 DIAGNOSIS — M899 Disorder of bone, unspecified: Secondary | ICD-10-CM | POA: Diagnosis not present

## 2011-08-08 DIAGNOSIS — Z780 Asymptomatic menopausal state: Secondary | ICD-10-CM | POA: Diagnosis not present

## 2011-08-27 ENCOUNTER — Telehealth: Payer: Self-pay | Admitting: Oncology

## 2011-08-27 NOTE — Telephone Encounter (Signed)
called pt as she lm to r/s missed may appt,

## 2011-10-05 DIAGNOSIS — H6980 Other specified disorders of Eustachian tube, unspecified ear: Secondary | ICD-10-CM | POA: Diagnosis not present

## 2011-10-05 DIAGNOSIS — H6690 Otitis media, unspecified, unspecified ear: Secondary | ICD-10-CM | POA: Diagnosis not present

## 2011-10-19 ENCOUNTER — Ambulatory Visit (INDEPENDENT_AMBULATORY_CARE_PROVIDER_SITE_OTHER): Payer: Medicare Other | Admitting: Gastroenterology

## 2011-10-19 ENCOUNTER — Encounter: Payer: Self-pay | Admitting: Gastroenterology

## 2011-10-19 VITALS — BP 140/80 | HR 88 | Ht 66.0 in | Wt 161.0 lb

## 2011-10-19 DIAGNOSIS — R195 Other fecal abnormalities: Secondary | ICD-10-CM | POA: Diagnosis not present

## 2011-10-19 NOTE — Progress Notes (Signed)
Review of pertinent gastrointestinal problems: 1. Adenomatous polyp June 2011, by colonoscopy Adolphe Fortunato 2. Sister MAY have had rectal cancer 3. Gastric GIST resected  1997 , EGD by Dr. Lajoyce Corners 2008 found no sign of recurrence   HPI: This is a  very pleasant 78 year old woman whom I last saw about 2-3 years ago.  She does not see any GI bleeding.  She was seen by her primary care physician recently and Hemoccult testing was done I believe just as part of routine annual physical.  She was found to have Hemoccult positive stool.  She is on iron daily, she has chronically dark stools.  No trouble eating.  Her weight has gone up in past few months.    She had menengitis in 2012 (acute H. Flu bacterial menengitis, was critically ill but survived).   Past Medical History  Diagnosis Date  . Fibroid   . Diabetes mellitus   . Cancer     Breast cancer-Stomach cancer  . Hypertension   . Hyperlipemia   . Hx of meningitis 2012    Past Surgical History  Procedure Date  . Cholecystectomy   . Abdominal hysterectomy 1974    Supracervical  . Stomach tumor   . Breast surgery 1993    Lumpectomy-Mastectomy-Right    Current Outpatient Prescriptions  Medication Sig Dispense Refill  . amLODipine (NORVASC) 5 MG tablet Take 1 tablet (5 mg total) by mouth daily.  30 tablet  1  . diazepam (VALIUM) 5 MG tablet Take 5 mg by mouth 2 (two) times daily.        . ferrous sulfate 325 (65 FE) MG tablet Take 325 mg by mouth daily with breakfast.        . glipiZIDE (GLUCOTROL XL) 10 MG 24 hr tablet Take 10 mg by mouth daily.        Marland Kitchen lisinopril (PRINIVIL,ZESTRIL) 20 MG tablet Take 40 mg by mouth daily.       . metFORMIN (GLUCOPHAGE) 500 MG tablet Take 500 mg by mouth 2 (two) times daily with a meal.        . potassium chloride SA (K-DUR,KLOR-CON) 20 MEQ tablet Take 2 tablets (40 mEq total) by mouth daily.  14 tablet  0  . pravastatin (PRAVACHOL) 40 MG tablet Take 40 mg by mouth daily.        . sertraline (ZOLOFT)  50 MG tablet Take 50 mg by mouth daily.          Allergies as of 10/19/2011 - Review Complete 10/19/2011  Allergen Reaction Noted  . Sulfonamide derivatives  07/27/2009    Family History  Problem Relation Age of Onset  . Breast cancer Sister   . Diabetes Sister   . Hypertension Sister   . Colon cancer Sister   . Melanoma Sister   . Heart disease Brother   . Diabetes Brother   . Hypertension Brother   . Cancer Brother     Prostate cancer  . Colon cancer Brother     History   Social History  . Marital Status: Widowed    Spouse Name: N/A    Number of Children: N/A  . Years of Education: N/A   Occupational History  . Not on file.   Social History Main Topics  . Smoking status: Never Smoker   . Smokeless tobacco: Never Used  . Alcohol Use: No  . Drug Use: No  . Sexually Active: Yes    Birth Control/ Protection: Surgical   Other Topics Concern  .  Not on file   Social History Narrative  . No narrative on file      Physical Exam: BP 140/80  Pulse 88  Ht 5\' 6"  (1.676 m)  Wt 161 lb (73.029 kg)  BMI 25.99 kg/m2 Constitutional: generally well-appearing Psychiatric: alert and oriented x3 Abdomen: soft, nontender, nondistended, no obvious ascites, no peritoneal signs, normal bowel sounds     Assessment and plan: 78 y.o. female with Hemoccult-positive stool  I did not mention above but recent CBC shows that she is not anemic. She had a colonoscopy at 2 years ago and that does not need to be repeated now. I think we should proceed with EGD however since she had a gastric tumor resected in 1997. In the future she does not require annual Hemoccult testing of her stool to screen her for colon cancer since she is opting for colon cancer screening, colon polyp surveillance by colonoscopy instead.

## 2011-10-19 NOTE — Patient Instructions (Addendum)
You will be set up for an upper endoscopy for hemocult positive stool, history of gastric tumor (GIST) You do not need annual hemmocult testing to screen for colon cancer since you are being surveyed for colon cancer every 5 years with colonoscopy.

## 2011-10-23 ENCOUNTER — Other Ambulatory Visit (HOSPITAL_BASED_OUTPATIENT_CLINIC_OR_DEPARTMENT_OTHER): Payer: Medicare Other | Admitting: Lab

## 2011-10-23 ENCOUNTER — Ambulatory Visit (HOSPITAL_BASED_OUTPATIENT_CLINIC_OR_DEPARTMENT_OTHER): Payer: Medicare Other | Admitting: Oncology

## 2011-10-23 ENCOUNTER — Encounter: Payer: Self-pay | Admitting: Oncology

## 2011-10-23 VITALS — BP 156/83 | HR 76 | Temp 98.0°F | Resp 20 | Ht 66.0 in | Wt 154.6 lb

## 2011-10-23 DIAGNOSIS — C50419 Malignant neoplasm of upper-outer quadrant of unspecified female breast: Secondary | ICD-10-CM | POA: Diagnosis not present

## 2011-10-23 DIAGNOSIS — C494 Malignant neoplasm of connective and soft tissue of abdomen: Secondary | ICD-10-CM

## 2011-10-23 DIAGNOSIS — Z8509 Personal history of malignant neoplasm of other digestive organs: Secondary | ICD-10-CM

## 2011-10-23 DIAGNOSIS — Z853 Personal history of malignant neoplasm of breast: Secondary | ICD-10-CM

## 2011-10-23 DIAGNOSIS — D0510 Intraductal carcinoma in situ of unspecified breast: Secondary | ICD-10-CM | POA: Insufficient documentation

## 2011-10-23 DIAGNOSIS — H7120 Cholesteatoma of mastoid, unspecified ear: Secondary | ICD-10-CM

## 2011-10-23 DIAGNOSIS — M810 Age-related osteoporosis without current pathological fracture: Secondary | ICD-10-CM

## 2011-10-23 DIAGNOSIS — C49A0 Gastrointestinal stromal tumor, unspecified site: Secondary | ICD-10-CM

## 2011-10-23 LAB — CBC WITH DIFFERENTIAL/PLATELET
BASO%: 0.6 % (ref 0.0–2.0)
Basophils Absolute: 0 10*3/uL (ref 0.0–0.1)
EOS%: 1.7 % (ref 0.0–7.0)
Eosinophils Absolute: 0.1 10*3/uL (ref 0.0–0.5)
HCT: 42.5 % (ref 34.8–46.6)
HGB: 13.5 g/dL (ref 11.6–15.9)
LYMPH%: 28.1 % (ref 14.0–49.7)
MCH: 26 pg (ref 25.1–34.0)
MCHC: 31.7 g/dL (ref 31.5–36.0)
MCV: 81.8 fL (ref 79.5–101.0)
MONO#: 0.4 10*3/uL (ref 0.1–0.9)
MONO%: 5.3 % (ref 0.0–14.0)
NEUT#: 5 10*3/uL (ref 1.5–6.5)
NEUT%: 64.3 % (ref 38.4–76.8)
Platelets: 280 10*3/uL (ref 145–400)
RBC: 5.19 10*6/uL (ref 3.70–5.45)
RDW: 14.3 % (ref 11.2–14.5)
WBC: 7.8 10*3/uL (ref 3.9–10.3)
lymph#: 2.2 10*3/uL (ref 0.9–3.3)

## 2011-10-23 LAB — LACTATE DEHYDROGENASE (CC13): LDH: 167 U/L (ref 125–220)

## 2011-10-23 LAB — COMPREHENSIVE METABOLIC PANEL (CC13)
ALT: 16 U/L (ref 0–55)
AST: 19 U/L (ref 5–34)
Albumin: 3.7 g/dL (ref 3.5–5.0)
Alkaline Phosphatase: 49 U/L (ref 40–150)
BUN: 13 mg/dL (ref 7.0–26.0)
CO2: 23 mEq/L (ref 22–29)
Calcium: 9.5 mg/dL (ref 8.4–10.4)
Chloride: 109 mEq/L — ABNORMAL HIGH (ref 98–107)
Creatinine: 0.8 mg/dL (ref 0.6–1.1)
Glucose: 107 mg/dl — ABNORMAL HIGH (ref 70–99)
Potassium: 4.1 mEq/L (ref 3.5–5.1)
Sodium: 142 mEq/L (ref 136–145)
Total Bilirubin: 0.3 mg/dL (ref 0.20–1.20)
Total Protein: 6.5 g/dL (ref 6.4–8.3)

## 2011-10-23 NOTE — Progress Notes (Signed)
This office note has been dictated.  [id number]

## 2011-10-23 NOTE — Progress Notes (Signed)
CC:   Cindy Wong, M.D. Cindy L. Cindy Wong, M.D. Cindy Banister, MD  PROBLEM LIST: 1. Intraductal carcinoma of the right breast diagnosed in July 1993,     treated with lumpectomy and radiation treatments.  The patient     developed recurrent ductal carcinoma in situ from a biopsy carried     out on 08/03/2005 and underwent a right-sided mastectomy on     08/24/2005. 2. History of gastrointestinal stromal tumor involving the stomach     status post partial gastrectomy on 03/24/1995 without evidence of     recurrence. 3. History of osteoporosis. 4. History of depression and anxiety. 5. 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 6. Right middle ear and mastoid cholesteatoma status post surgery on     06/18/2011 at [facility]. 7. Diabetes mellitus type 2. 8. Hypertension. 9. Dyslipidemia. 10.Irritable bowel syndrome. 11.Family history of colon cancer involving the patient's brother.   MEDICATIONS: 1. Fosamax 70 mg weekly.   2. Norvasc 5 mg daily.   3. Valium 5 mg twice daily.   4. Ferrous sulfate 325 mg daily.   5. Glucotrol XL 10 mg daily.   6. Lisinopril 40 mg daily.   7. Metformin 500 mg twice daily.   8. Centrum Silver one daily.   9. Potassium chloride SA 40 mEq daily.   10. Pravachol 40 mg daily.   11. Zoloft 50 mg daily.  HISTORY:  I am seeing Cindy Wong today for followup of her history of recurrent DCIS involving the right breast with original diagnosis going back to August 21, 1991, and recurrence treated with mastectomy dating back to August 28, 2005.  The patient is on no therapy for her DCIS at this time.  In addition, the patient also underwent a partial gastrectomy on March 24, 1995, for a GI stromal tumor of the stomach.  The patient was last seen by [place] on Jun 22, 2010.  We were going to see Cindy Wong for yearly visits.  She has had unfortunately  a rather eventful year.  She required hospitalization for meningitis with Haemophilus influenzae in mid to late December 2012.  This was secondary to right chronic mastoiditis.  The patient then was found to have a cholesteatoma of the right middle ear and mastoid on CT scan carried out on 05/04/2011.  She was referred to [facility] where she underwent definitive surgery for this problem on 06/18/2011.  At the present time, the patient seems to be doing fairly well.  She is on iron.  She denies any obvious blood in her stools.  However, apparently stools were Hemoccult positive.  She has seen Dr. Owens Loffler.  The patient is scheduled to have upper endoscopy on 11/06/2011. Her last colonoscopy was carried out on 08/03/2009.  The patient is without any complaints today.  Seems to be doing well at the present time.  PHYSICAL EXAMINATION:  General:  She looks well, certainly younger than her stated age of 70.  Vital Signs:  Weight is 154.6 pounds as compared with 156.7 pounds on Jun 22, 2010.  Height 5 feet 6 inches, body surface area 1.81 sq m.  Blood pressure 156/83.  Other vital signs are normal. HEENT:  There is no scleral icterus.  Mouth and pharynx are benign.  No peripheral adenopathy palpable.  Heart/Lungs:  Normal.  Breasts:  Right breast is surgically absent via  a right-sided mastectomy without evidence of chest wall recurrence.  Left breast is benign.  No axillary adenopathy.  Abdomen:  Benign with no organomegaly or masses palpable. Extremities:  No peripheral edema or clubbing.  No obvious lymphedema of the right arm.  The patient has a multitude of seborrheic keratoses over the trunk.  Neurologic:  Normal.  Skin:  A cafe au lait spot was seen in the left lateral chest region.  LABORATORY DATA:  Today, white count 7.8, ANC 5.0, hemoglobin 13.5, hematocrit 42.5 platelets 288,000.  Chemistries were essentially normal.  IMAGING STUDIES: 1.  MRI of the head without IV contrast on 02/05/2011 showed findings     compatible with a right ostial mastoiditis.  No overt intracranial     extension.  There was questionable trace debris layering in the     lateral ventricles suggesting the possibility of meningitis or     ventriculitis.  There were mild to moderate mastoid inflammatory     changes also on the left.  No obstructing etiology was identified     in the nasopharynx. 2. Portable chest x-ray, 1 view on 02/06/2011, showed removal of an     endotracheal tube with increased basilar densities. 3. Left digital screening mammogram with CAD on 03/02/2011 was     negative. 4. CT scan of temporal bones without IV contrast on 05/04/2011 showed     right middle ear and mastoid cholesteatoma with the most clinically     significant finding being destruction of the tegmen tympani and     tegmen mastoideum.  There was a right tympanostomy tube in good     position with significant improvement in the right middle ear fluid     compared to December scans. 5. Right ribs and chest x-ray from 05/08/2011 showed no active lung     disease.  There was stable mild cardiomegaly.  There was negative     right rib detail.  IMPRESSION AND PLAN:  At the present time, Cindy Wong appears to be doing well.  She is now 16-1/2 years from the time of her gastrointestinal stromal tumor of the stomach and 20 years from the time of her original diagnosis of ductal carcinoma in situ involving the right breast, but 6 years from the time of her recurrence in July 2007. The patient seems to be doing well from the standpoint of both of these problems.  The patient is scheduled for upper endoscopy by Dr. Owens Loffler on 11/06/2011.  We will plan to see the patient again in 1 year at which time we will check CBC and chemistries.    ______________________________ Jeanie Cooks, M.D. DSM/MEDQ  D:  10/23/2011  T:  10/23/2011  Job:  [id number]

## 2011-10-24 ENCOUNTER — Telehealth: Payer: Self-pay | Admitting: *Deleted

## 2011-10-24 DIAGNOSIS — M79609 Pain in unspecified limb: Secondary | ICD-10-CM | POA: Diagnosis not present

## 2011-10-24 DIAGNOSIS — B351 Tinea unguium: Secondary | ICD-10-CM | POA: Diagnosis not present

## 2011-10-24 NOTE — Telephone Encounter (Signed)
maield out calendar and inform the patient of the new date and time on 10-23-2012 starting at 8:30am

## 2011-11-06 ENCOUNTER — Encounter: Payer: Self-pay | Admitting: Gastroenterology

## 2011-11-06 ENCOUNTER — Ambulatory Visit (AMBULATORY_SURGERY_CENTER): Payer: Medicare Other | Admitting: Gastroenterology

## 2011-11-06 VITALS — BP 156/117 | HR 65 | Temp 98.5°F | Resp 17 | Ht 66.0 in | Wt 161.0 lb

## 2011-11-06 DIAGNOSIS — Q393 Congenital stenosis and stricture of esophagus: Secondary | ICD-10-CM | POA: Diagnosis not present

## 2011-11-06 DIAGNOSIS — R933 Abnormal findings on diagnostic imaging of other parts of digestive tract: Secondary | ICD-10-CM

## 2011-11-06 DIAGNOSIS — R195 Other fecal abnormalities: Secondary | ICD-10-CM

## 2011-11-06 DIAGNOSIS — K222 Esophageal obstruction: Secondary | ICD-10-CM

## 2011-11-06 DIAGNOSIS — Q391 Atresia of esophagus with tracheo-esophageal fistula: Secondary | ICD-10-CM

## 2011-11-06 DIAGNOSIS — K2990 Gastroduodenitis, unspecified, without bleeding: Secondary | ICD-10-CM | POA: Diagnosis not present

## 2011-11-06 DIAGNOSIS — K2970 Gastritis, unspecified, without bleeding: Secondary | ICD-10-CM | POA: Diagnosis not present

## 2011-11-06 MED ORDER — SODIUM CHLORIDE 0.9 % IV SOLN: 500.0000 mL | INTRAVENOUS | Status: DC

## 2011-11-06 NOTE — Progress Notes (Signed)
Patient did not experience any of the following events: a burn prior to discharge; a fall within the facility; wrong site/side/patient/procedure/implant event; or a hospital transfer or hospital admission upon discharge from the facility. (G8907) Patient did not have preoperative order for IV antibiotic SSI prophylaxis. (G8918)  

## 2011-11-06 NOTE — Op Note (Signed)
[place]  [address] [place], [address]   ENDOSCOPY PROCEDURE REPORT  PATIENT: Cindy Wong, [place]  MR#: [id number] BIRTHDATE: [birth year] , 80  yrs. old GENDER: Female ENDOSCOPIST: Milus Banister, MD REFERRED BY:  Domenick Gong, M.D. PROCEDURE DATE:  11/06/2011 PROCEDURE:  EGD w/ biopsy ASA CLASS:     Class III INDICATIONS:  1997 gastric GIST resection; colonsocopy 2011; recent physical found to be FOBT +. MEDICATIONS: Fentanyl 25 mcg IV, Versed 4 mg IV, and These medications were titrated to patient response per physician's verbal order TOPICAL ANESTHETIC: Cetacaine Spray  DESCRIPTION OF PROCEDURE: After the risks benefits and alternatives of the procedure were thoroughly explained, informed consent was obtained.  The [facility] GIF-H180 [id number] endoscope was introduced through the mouth and advanced to the second portion of the duodenum. Without limitations.  The instrument was slowly withdrawn as the mucosa was fully examined.   There was a slight Schatzki's ring at GE junction, not dilated today.  There was site of previous 1997 gastric resection (wedge resection?) in distal stomach.  The mucosa at the site was slightly irregular, whitish appearing but not clearly neoplastic.  Biopsies taken and sent to pathology.  The examination was otherwise normal. Retroflexed views revealed no abnormalities.     The scope was then withdrawn from the patient and the procedure completed. COMPLICATIONS: There were no complications.  ENDOSCOPIC IMPRESSION: There was a slight Schatzki's ring at GE junction Slightly irregular, whitish appearing mucosa at site of previous gastric resection. This was not clearly neoplastic.  Biopsies taken and sent to pathology. The examination was otherwise normal.  RECOMMENDATIONS: await biopsy results    eSigned:  Milus Banister, MD 11/06/2011 2:30 PM

## 2011-11-06 NOTE — Patient Instructions (Addendum)
YOU HAD AN ENDOSCOPIC PROCEDURE TODAY AT THE [facility]: Refer to the procedure report that was given to you for any specific questions about what was found during the examination.  If the procedure report does not answer your questions, please call your gastroenterologist to clarify.  If you requested that your care partner not be given the details of your procedure findings, then the procedure report has been included in a sealed envelope for you to review at your convenience later.  YOU SHOULD EXPECT: Some feelings of bloating in the abdomen. Passage of more gas than usual.  Walking can help get rid of the air that was put into your GI tract during the procedure and reduce the bloating. If you had a lower endoscopy (such as a colonoscopy or flexible sigmoidoscopy) you may notice spotting of blood in your stool or on the toilet paper. If you underwent a bowel prep for your procedure, then you may not have a normal bowel movement for a few days.  DIET: Your first meal following the procedure should be a light meal and then it is ok to progress to your normal diet.  A half-sandwich or bowl of soup is an example of a good first meal.  Heavy or fried foods are harder to digest and may make you feel nauseous or bloated.  Likewise meals heavy in dairy and vegetables can cause extra gas to form and this can also increase the bloating.  Drink plenty of fluids but you should avoid alcoholic beverages for 24 hours.  ACTIVITY: Your care partner should take you home directly after the procedure.  You should plan to take it easy, moving slowly for the rest of the day.  You can resume normal activity the day after the procedure however you should NOT DRIVE or use heavy machinery for 24 hours (because of the sedation medicines used during the test).    SYMPTOMS TO REPORT IMMEDIATELY: A gastroenterologist can be reached at any hour.  During normal business hours, 8:30 AM to 5:00 PM Monday through Friday,  call [phone].  After hours and on weekends, please call the GI answering service at [phone] who will take a message and have the physician on call contact you.  Following upper endoscopy (EGD)  Vomiting of blood or coffee ground material  New chest pain or pain under the shoulder blades  Painful or persistently difficult swallowing  New shortness of breath  Fever of 100F or higher  Black, tarry-looking stools  FOLLOW UP: If any biopsies were taken you will be contacted by phone or by letter within the next 1-3 weeks.  Call your gastroenterologist if you have not heard about the biopsies in 3 weeks.  Our staff will call the home number listed on your records the next business day following your procedure to check on you and address any questions or concerns that you may have at that time regarding the information given to you following your procedure. This is a courtesy call and so if there is no answer at the home number and we have not heard from you through the emergency physician on call, we will assume that you have returned to your regular daily activities without incident.  SIGNATURES/CONFIDENTIALITY: You and/or your care partner have signed paperwork which will be entered into your electronic medical record.  These signatures attest to the fact that that the information above on your After Visit Summary has been reviewed and is understood.  Full responsibility of   the confidentiality of this discharge information lies with you and/or your care-partner. 

## 2011-11-06 NOTE — Progress Notes (Addendum)
CRANGRAPW JUICE GIVEN PRIOR TO LEAVING, PATIENT INSTRUCTED TO EAT ON ARRIVAL HOME. PATIENT NOT TO TAKE MORNING DIABETIC PILL THIS AFTERNOON.

## 2011-11-07 ENCOUNTER — Telehealth: Payer: Self-pay | Admitting: *Deleted

## 2011-11-07 NOTE — Telephone Encounter (Signed)
  Follow up Call-  Call back number 11/06/2011  Post procedure Call Back phone  # [phone]  Permission to leave phone message Yes     Patient questions:  Do you have a fever, pain , or abdominal swelling? no Pain Score  0 *  Have you tolerated food without any problems? yes  Have you been able to return to your normal activities? yes  Do you have any questions about your discharge instructions: Diet   no Medications  no Follow up visit  no  Do you have questions or concerns about your Care? no  Actions: * If pain score is 4 or above: No action needed, pain <4.

## 2011-11-12 ENCOUNTER — Encounter: Payer: Self-pay | Admitting: Gastroenterology

## 2011-11-15 DIAGNOSIS — H2510 Age-related nuclear cataract, unspecified eye: Secondary | ICD-10-CM | POA: Diagnosis not present

## 2011-11-15 DIAGNOSIS — H25019 Cortical age-related cataract, unspecified eye: Secondary | ICD-10-CM | POA: Diagnosis not present

## 2011-11-15 DIAGNOSIS — E119 Type 2 diabetes mellitus without complications: Secondary | ICD-10-CM | POA: Diagnosis not present

## 2012-01-22 DIAGNOSIS — E559 Vitamin D deficiency, unspecified: Secondary | ICD-10-CM | POA: Diagnosis not present

## 2012-01-22 DIAGNOSIS — M949 Disorder of cartilage, unspecified: Secondary | ICD-10-CM | POA: Diagnosis not present

## 2012-01-22 DIAGNOSIS — I10 Essential (primary) hypertension: Secondary | ICD-10-CM | POA: Diagnosis not present

## 2012-01-22 DIAGNOSIS — M899 Disorder of bone, unspecified: Secondary | ICD-10-CM | POA: Diagnosis not present

## 2012-01-22 DIAGNOSIS — E119 Type 2 diabetes mellitus without complications: Secondary | ICD-10-CM | POA: Diagnosis not present

## 2012-01-22 DIAGNOSIS — Z23 Encounter for immunization: Secondary | ICD-10-CM | POA: Diagnosis not present

## 2012-01-22 DIAGNOSIS — E785 Hyperlipidemia, unspecified: Secondary | ICD-10-CM | POA: Diagnosis not present

## 2012-01-24 DIAGNOSIS — M79609 Pain in unspecified limb: Secondary | ICD-10-CM | POA: Diagnosis not present

## 2012-01-24 DIAGNOSIS — B351 Tinea unguium: Secondary | ICD-10-CM | POA: Diagnosis not present

## 2012-02-15 DIAGNOSIS — H6980 Other specified disorders of Eustachian tube, unspecified ear: Secondary | ICD-10-CM | POA: Diagnosis not present

## 2012-02-15 DIAGNOSIS — Z09 Encounter for follow-up examination after completed treatment for conditions other than malignant neoplasm: Secondary | ICD-10-CM | POA: Diagnosis not present

## 2012-02-15 DIAGNOSIS — Z8661 Personal history of infections of the central nervous system: Secondary | ICD-10-CM | POA: Diagnosis not present

## 2012-02-27 ENCOUNTER — Other Ambulatory Visit: Payer: Self-pay | Admitting: Oncology

## 2012-02-27 DIAGNOSIS — Z1231 Encounter for screening mammogram for malignant neoplasm of breast: Secondary | ICD-10-CM

## 2012-02-27 DIAGNOSIS — Z853 Personal history of malignant neoplasm of breast: Secondary | ICD-10-CM

## 2012-02-27 DIAGNOSIS — Z9011 Acquired absence of right breast and nipple: Secondary | ICD-10-CM

## 2012-03-25 ENCOUNTER — Ambulatory Visit
Admission: RE | Admit: 2012-03-25 | Discharge: 2012-03-25 | Disposition: A | Discharge status: Home / Self Care | Payer: Medicare Other | Source: Ambulatory Visit | Attending: Oncology | Admitting: Oncology

## 2012-03-25 DIAGNOSIS — Z853 Personal history of malignant neoplasm of breast: Secondary | ICD-10-CM

## 2012-03-25 DIAGNOSIS — Z1231 Encounter for screening mammogram for malignant neoplasm of breast: Secondary | ICD-10-CM

## 2012-03-25 DIAGNOSIS — Z9011 Acquired absence of right breast and nipple: Secondary | ICD-10-CM

## 2012-04-17 DIAGNOSIS — B351 Tinea unguium: Secondary | ICD-10-CM | POA: Diagnosis not present

## 2012-04-17 DIAGNOSIS — M79609 Pain in unspecified limb: Secondary | ICD-10-CM | POA: Diagnosis not present

## 2012-04-23 DIAGNOSIS — M949 Disorder of cartilage, unspecified: Secondary | ICD-10-CM | POA: Diagnosis not present

## 2012-04-23 DIAGNOSIS — I10 Essential (primary) hypertension: Secondary | ICD-10-CM | POA: Diagnosis not present

## 2012-04-23 DIAGNOSIS — E559 Vitamin D deficiency, unspecified: Secondary | ICD-10-CM | POA: Diagnosis not present

## 2012-04-23 DIAGNOSIS — C50919 Malignant neoplasm of unspecified site of unspecified female breast: Secondary | ICD-10-CM | POA: Diagnosis not present

## 2012-04-23 DIAGNOSIS — E119 Type 2 diabetes mellitus without complications: Secondary | ICD-10-CM | POA: Diagnosis not present

## 2012-04-23 DIAGNOSIS — E785 Hyperlipidemia, unspecified: Secondary | ICD-10-CM | POA: Diagnosis not present

## 2012-04-23 DIAGNOSIS — K222 Esophageal obstruction: Secondary | ICD-10-CM | POA: Diagnosis not present

## 2012-04-23 DIAGNOSIS — M899 Disorder of bone, unspecified: Secondary | ICD-10-CM | POA: Diagnosis not present

## 2012-04-23 DIAGNOSIS — Z9010 Acquired absence of unspecified breast and nipple: Secondary | ICD-10-CM | POA: Diagnosis not present

## 2012-07-22 DIAGNOSIS — E119 Type 2 diabetes mellitus without complications: Secondary | ICD-10-CM | POA: Diagnosis not present

## 2012-07-22 DIAGNOSIS — E785 Hyperlipidemia, unspecified: Secondary | ICD-10-CM | POA: Diagnosis not present

## 2012-07-22 DIAGNOSIS — E559 Vitamin D deficiency, unspecified: Secondary | ICD-10-CM | POA: Diagnosis not present

## 2012-07-24 DIAGNOSIS — B351 Tinea unguium: Secondary | ICD-10-CM | POA: Diagnosis not present

## 2012-07-24 DIAGNOSIS — M79609 Pain in unspecified limb: Secondary | ICD-10-CM | POA: Diagnosis not present

## 2012-07-29 DIAGNOSIS — M899 Disorder of bone, unspecified: Secondary | ICD-10-CM | POA: Diagnosis not present

## 2012-07-29 DIAGNOSIS — E119 Type 2 diabetes mellitus without complications: Secondary | ICD-10-CM | POA: Diagnosis not present

## 2012-07-29 DIAGNOSIS — Z1331 Encounter for screening for depression: Secondary | ICD-10-CM | POA: Diagnosis not present

## 2012-07-29 DIAGNOSIS — M949 Disorder of cartilage, unspecified: Secondary | ICD-10-CM | POA: Diagnosis not present

## 2012-07-29 DIAGNOSIS — Z9010 Acquired absence of unspecified breast and nipple: Secondary | ICD-10-CM | POA: Diagnosis not present

## 2012-07-29 DIAGNOSIS — I10 Essential (primary) hypertension: Secondary | ICD-10-CM | POA: Diagnosis not present

## 2012-07-29 DIAGNOSIS — E785 Hyperlipidemia, unspecified: Secondary | ICD-10-CM | POA: Diagnosis not present

## 2012-07-29 DIAGNOSIS — F341 Dysthymic disorder: Secondary | ICD-10-CM | POA: Diagnosis not present

## 2012-07-29 DIAGNOSIS — Z0000 Encounter for general adult medical examination without abnormal findings: Secondary | ICD-10-CM | POA: Diagnosis not present

## 2012-08-28 ENCOUNTER — Encounter: Payer: Medicare Other | Admitting: Gynecology

## 2012-09-09 DIAGNOSIS — D235 Other benign neoplasm of skin of trunk: Secondary | ICD-10-CM | POA: Diagnosis not present

## 2012-09-09 DIAGNOSIS — D1801 Hemangioma of skin and subcutaneous tissue: Secondary | ICD-10-CM | POA: Diagnosis not present

## 2012-09-09 DIAGNOSIS — L821 Other seborrheic keratosis: Secondary | ICD-10-CM | POA: Diagnosis not present

## 2012-09-19 ENCOUNTER — Encounter: Payer: Medicare Other | Admitting: Gynecology

## 2012-10-14 ENCOUNTER — Encounter: Payer: Medicare Other | Admitting: Gynecology

## 2012-10-14 DIAGNOSIS — H6980 Other specified disorders of Eustachian tube, unspecified ear: Secondary | ICD-10-CM | POA: Diagnosis not present

## 2012-10-14 DIAGNOSIS — E119 Type 2 diabetes mellitus without complications: Secondary | ICD-10-CM | POA: Diagnosis not present

## 2012-10-14 DIAGNOSIS — H903 Sensorineural hearing loss, bilateral: Secondary | ICD-10-CM | POA: Diagnosis not present

## 2012-10-16 ENCOUNTER — Ambulatory Visit (INDEPENDENT_AMBULATORY_CARE_PROVIDER_SITE_OTHER): Payer: Medicare Other | Admitting: Gynecology

## 2012-10-16 ENCOUNTER — Encounter: Payer: Self-pay | Admitting: Gynecology

## 2012-10-16 VITALS — BP 130/80 | Ht 67.0 in | Wt 165.0 lb

## 2012-10-16 DIAGNOSIS — M8580 Other specified disorders of bone density and structure, unspecified site: Secondary | ICD-10-CM

## 2012-10-16 DIAGNOSIS — N952 Postmenopausal atrophic vaginitis: Secondary | ICD-10-CM | POA: Diagnosis not present

## 2012-10-16 DIAGNOSIS — M899 Disorder of bone, unspecified: Secondary | ICD-10-CM | POA: Diagnosis not present

## 2012-10-16 DIAGNOSIS — C50919 Malignant neoplasm of unspecified site of unspecified female breast: Secondary | ICD-10-CM

## 2012-10-16 DIAGNOSIS — C50911 Malignant neoplasm of unspecified site of right female breast: Secondary | ICD-10-CM

## 2012-10-16 DIAGNOSIS — M949 Disorder of cartilage, unspecified: Secondary | ICD-10-CM

## 2012-10-16 NOTE — Progress Notes (Signed)
Cindy Wong 1931-08-30 [id number]        79 y.o.  G1P0100 for followup exam.   Former patient of Dr. Cherylann Banas. Several issues noted below  Past medical history,surgical history, medications, allergies, family history and social history were all reviewed and documented in the EPIC chart.  ROS:  Performed and pertinent positives and negatives are included in the history, assessment and plan .  Exam: Kim assistant Filed Vitals:   10/16/12 1422  BP: 130/80  Height: 5\' 7"  (1.702 m)  Weight: 165 lb (74.844 kg)   General appearance  Normal Skin grossly normal Head/Neck normal with no cervical or supraclavicular adenopathy thyroid normal Lungs  clear Cardiac RR, without RMG Abdominal  soft, nontender, without masses, organomegaly or hernia Breast/chest wall  examined lying and sitting. Left without masses, retractions, discharge or axillary adenopathy. Right chest wall without masses, ulcerations or adenopathy Pelvic  Ext/BUS/vagina  atrophic changes  Cervix  atrophic flush with the upper vagina difficult to visualize  Adnexa  Without masses or tenderness    Anus and perineum  normal   Rectovaginal  normal sphincter tone without palpated masses or tenderness.    Assessment/Plan:  79 y.o. G45P0100 female for followup exam.   1. Postmenopausal status post supracervical hysterectomy for leiomyoma. Atrophic genital changes. Patient's asymptomatic without significant hot flushes, vaginal dryness. Is not sexually active. 2. Breast cancer status post mastectomy 1993 right side. Doing well with mammography 03/2012. Continue with annual mammography. SBE monthly reviewed. 3. Pap smear 2010. No Pap smear done today. No history of abnormal Pap smears before. Discussed current screening guidelines we'll plan stop screening she agrees with this. 4. Osteopenia. Do not have current DEXA study available. Reported last DEXA 2013. Past history of Fosamax use with current drug-free holiday. Continue to  followup with Dr. Osborne Casco in reference to this. Increase calcium vitamin D reviewed. 5. Colonoscopy 2011. Repeat that today her recommended interval. 6. Health maintenance. No blood work done as it is all done through Dr. Loren Racer office. Followup one year, sooner as needed.  Note: This document was prepared with digital dictation and possible smart phrase technology. Any transcriptional errors that result from this process are unintentional.   Anastasio Auerbach MD, 2:47 PM 10/16/2012

## 2012-10-16 NOTE — Patient Instructions (Addendum)
Follow up in one year for annual exam 

## 2012-10-22 ENCOUNTER — Other Ambulatory Visit: Payer: Self-pay | Admitting: Medical Oncology

## 2012-10-22 DIAGNOSIS — C49A0 Gastrointestinal stromal tumor, unspecified site: Secondary | ICD-10-CM

## 2012-10-23 ENCOUNTER — Encounter: Payer: Self-pay | Admitting: Internal Medicine

## 2012-10-23 ENCOUNTER — Other Ambulatory Visit (HOSPITAL_BASED_OUTPATIENT_CLINIC_OR_DEPARTMENT_OTHER): Payer: Medicare Other | Admitting: Lab

## 2012-10-23 ENCOUNTER — Telehealth: Payer: Self-pay | Admitting: Internal Medicine

## 2012-10-23 ENCOUNTER — Ambulatory Visit (HOSPITAL_BASED_OUTPATIENT_CLINIC_OR_DEPARTMENT_OTHER): Payer: Medicare Other | Admitting: Internal Medicine

## 2012-10-23 VITALS — BP 174/72 | HR 77 | Temp 97.6°F | Wt 167.4 lb

## 2012-10-23 DIAGNOSIS — C494 Malignant neoplasm of connective and soft tissue of abdomen: Secondary | ICD-10-CM

## 2012-10-23 DIAGNOSIS — Z8589 Personal history of malignant neoplasm of other organs and systems: Secondary | ICD-10-CM | POA: Diagnosis not present

## 2012-10-23 DIAGNOSIS — Z853 Personal history of malignant neoplasm of breast: Secondary | ICD-10-CM

## 2012-10-23 DIAGNOSIS — D0590 Unspecified type of carcinoma in situ of unspecified breast: Secondary | ICD-10-CM

## 2012-10-23 DIAGNOSIS — C49A0 Gastrointestinal stromal tumor, unspecified site: Secondary | ICD-10-CM

## 2012-10-23 DIAGNOSIS — D0511 Intraductal carcinoma in situ of right breast: Secondary | ICD-10-CM

## 2012-10-23 LAB — CBC WITH DIFFERENTIAL/PLATELET
BASO%: 1.2 % (ref 0.0–2.0)
Basophils Absolute: 0.1 10*3/uL (ref 0.0–0.1)
EOS%: 2 % (ref 0.0–7.0)
Eosinophils Absolute: 0.2 10*3/uL (ref 0.0–0.5)
HCT: 42.4 % (ref 34.8–46.6)
HGB: 13.6 g/dL (ref 11.6–15.9)
LYMPH%: 26.1 % (ref 14.0–49.7)
MCH: 26.5 pg (ref 25.1–34.0)
MCHC: 32.2 g/dL (ref 31.5–36.0)
MCV: 82.4 fL (ref 79.5–101.0)
MONO#: 0.5 10*3/uL (ref 0.1–0.9)
MONO%: 6 % (ref 0.0–14.0)
NEUT#: 5.1 10*3/uL (ref 1.5–6.5)
NEUT%: 64.7 % (ref 38.4–76.8)
Platelets: 288 10*3/uL (ref 145–400)
RBC: 5.14 10*6/uL (ref 3.70–5.45)
RDW: 13.1 % (ref 11.2–14.5)
WBC: 7.9 10*3/uL (ref 3.9–10.3)
lymph#: 2.1 10*3/uL (ref 0.9–3.3)

## 2012-10-23 LAB — COMPREHENSIVE METABOLIC PANEL (CC13)
ALT: 22 U/L (ref 0–55)
AST: 22 U/L (ref 5–34)
Albumin: 3.5 g/dL (ref 3.5–5.0)
Alkaline Phosphatase: 47 U/L (ref 40–150)
BUN: 14.9 mg/dL (ref 7.0–26.0)
CO2: 23 mEq/L (ref 22–29)
Calcium: 9.1 mg/dL (ref 8.4–10.4)
Chloride: 109 mEq/L (ref 98–109)
Creatinine: 0.9 mg/dL (ref 0.6–1.1)
Glucose: 227 mg/dl — ABNORMAL HIGH (ref 70–140)
Potassium: 3.9 mEq/L (ref 3.5–5.1)
Sodium: 140 mEq/L (ref 136–145)
Total Bilirubin: 0.34 mg/dL (ref 0.20–1.20)
Total Protein: 6.5 g/dL (ref 6.4–8.3)

## 2012-10-23 NOTE — Progress Notes (Signed)
Hematology and Oncology Follow Up Visit  Cindy Wong [id number] 04/15/1931 79 y.o. 10/23/2012 7:52 PM [name], MD  Principle Diagnosises: Ductal carcinoma in situ  (DCIS) of the right breast and a history of gastrointestinal stromal tumor (GIST)  Prior Therapy:  . DCIS right breast (1993) treated with lumpectomy and radiation treatments.  She  developed recurrent DCIS in 08/03/2005 and  underwent a right-sided mastectomy on 08/24/2005.  GIST involving the stomach s/p partial gastrectomy on 03/24/1995 without evidence of recurrence.  Current therapy: None  Interim History:  The patient was last seen by Dr. Ralene Ok on 10/23/2011.  She has been doing well.  She had a mammogram earlier this year.   She is being followed by Dr. Ardis Hughs.  She had an upper endoscopy on 11/06/2011.  Her last colonoscopy was done on 08/03/2009.  She is without complaints today.  She is doing very well.    Medications: I have reviewed the patient's current medications.  Current Outpatient Prescriptions  Medication Sig Dispense Refill  . amLODipine (NORVASC) 5 MG tablet Take 5 mg by mouth daily.      . diazepam (VALIUM) 5 MG tablet Take 5 mg by mouth 2 (two) times daily.        . ferrous sulfate 325 (65 FE) MG tablet Take 325 mg by mouth daily with breakfast.        . glipiZIDE (GLUCOTROL XL) 10 MG 24 hr tablet Take 10 mg by mouth daily.        . Linagliptin-Metformin HCl (JENTADUETO) 2.5-500 MG TABS Take by mouth.      Marland Kitchen lisinopril (PRINIVIL,ZESTRIL) 20 MG tablet Take 40 mg by mouth daily.       . Multiple Vitamins-Minerals (CENTRUM SILVER PO) Take by mouth.      . potassium chloride SA (K-DUR,KLOR-CON) 20 MEQ tablet Take 20 mEq by mouth daily.      . pravastatin (PRAVACHOL) 40 MG tablet Take 40 mg by mouth daily.        . sertraline (ZOLOFT) 50 MG tablet Take 50 mg by mouth daily.         No current facility-administered medications for this visit.     Allergies:  Allergies  Allergen Reactions   . Sulfonamide Derivatives     REACTION: hives   PMHx: 1. Intraductal carcinoma of the right breast diagnosed in July 1993,     treated with lumpectomy and radiation treatments.  The patient     developed recurrent ductal carcinoma in situ from a biopsy carried     out on 08/03/2005 and underwent a right-sided mastectomy on     08/24/2005. 2. History of GIST involving the stomach     status post partial gastrectomy on 03/24/1995 without evidence of     recurrence. 3. History of osteoporosis. 4. History of depression and anxiety. 5. 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 6. Right middle ear and mastoid cholesteatoma status post surgery on     06/18/2011 at [facility]. 7. Diabetes mellitus type 2. 8. Hypertension. 9. Dyslipidemia. 10.Irritable bowel syndrome. 11.Family history of colon cancer involving the patient's brother.  Surgical history, Social history, and Family History were reviewed and updated.  Review of Systems: Constitutional:  Negative for fever, chills, night sweats, anorexia, weight loss, pain. Cardiovascular: no chest pain or dyspnea on exertion Respiratory: no cough, shortness of breath, or wheezing Neurological: no TIA or stroke symptoms Dermatological:  positive for seberottic  ENT: negative for - headaches or visual changes Skin: Negative. Gastrointestinal: no abdominal pain, change in bowel habits, or black or bloody stools Genito-Urinary: no dysuria, trouble voiding, or hematuria Hematological and Lymphatic: negative for - bleeding problems or blood clots Breast: negative for breast lumps Musculoskeletal: negative for - joint pain Remaining ROS negative.  Physical Exam: Blood pressure 174/72, pulse 77, temperature 97.6 F (36.4 C), temperature source Oral, weight 167 lb 6.4 oz (75.932 kg). ECOG: 1 General appearance: alert and no distress Head:  Normocephalic, without obvious abnormality, atraumatic Neck: no adenopathy, supple, symmetrical, trachea midline and thyroid not enlarged, symmetric, no tenderness/mass/nodules Lymph nodes: Cervical, supraclavicular, and axillary nodes normal. Breast: Right breast is surgically absent via a right-sided mastectomy without evidence of chest wall recurrence.  Left breast is benign.  No axillary adenopathy. Heart:regular rate and rhythm, S1, S2 normal, no murmur, click, rub or gallop Lung:chest clear, no wheezing, rales, normal symmetric air entry, Heart exam - S1, S2 normal, no murmur, no gallop, rate regular Abdomin: soft, non-tender, without masses or organomegaly EXT:No edema Skin: A cafe au lait spot was seen in the left lateral chest region.  Lab Results: Lab Results  Component Value Date   WBC 7.9 10/23/2012   HGB 13.6 10/23/2012   HCT 42.4 10/23/2012   MCV 82.4 10/23/2012   PLT 288 10/23/2012     Chemistry      Component Value Date/Time   NA 140 10/23/2012 0840   NA 139 02/12/2011 0640   K 3.9 10/23/2012 0840   K 3.0* 02/12/2011 0640   CL 109* 10/23/2011 1527   CL 106 02/12/2011 0640   CO2 23 10/23/2012 0840   CO2 26 02/12/2011 0640   BUN 14.9 10/23/2012 0840   BUN 12 02/12/2011 0640   CREATININE 0.9 10/23/2012 0840   CREATININE 0.74 02/12/2011 0640      Component Value Date/Time   CALCIUM 9.1 10/23/2012 0840   CALCIUM 8.4 02/12/2011 0640   ALKPHOS 47 10/23/2012 0840   ALKPHOS 42 02/12/2011 0640   AST 22 10/23/2012 0840   AST 15 02/12/2011 0640   ALT 22 10/23/2012 0840   ALT 23 02/12/2011 0640   BILITOT 0.34 10/23/2012 0840   BILITOT 0.2* 02/12/2011 0640     Radiological Studies: MAMMOGRAPHIC UNILATERAL LEFT DIGITAL SCREENING WITH CAD (04/04/2012) Comparison: Previous exams.  FINDINGS:  ACR Breast Density Category 2: There is a scattered fibroglandular pattern.  No suspicious masses, architectural distortion, or calcifications are present.  Images were processed with CAD.  IMPRESSION:  No  mammographic evidence of malignancy.  A result letter of this screening mammogram will be mailed directly to the patient.  RECOMMENDATION:  Screening mammogram in one year. (Code:SM-B-01Y)  BI-RADS CATEGORY 1: Negative.   Impression and Plan: At the present time, Mrs. Conry appears to be doing well.  She is now 17-1/2 years from the time of her GIST of the stomach and 21 years from the time of her original diagnosis of DCIS involving the right breast, but 7 years from the time of her recurrence in July 2007.  She is doing well.  We will plan to see her back in 1 year.  She will have CBC and chemistries.  She should have a mammogram in February of 2015.  She was also advised to continue to closely monitor her blood pressure and remain compliant to her antihypertensives with avoidance of high salts.   Spent more than half the time coordinating care.  Stedman [place], MD 9/4/20147:52 PM

## 2012-10-23 NOTE — Telephone Encounter (Signed)
gv and printed appt sched and avs for opt for Sept 2015

## 2012-10-23 NOTE — Patient Instructions (Addendum)
1. RTC in one year for labs including CBC w differential, CMP  2. Complete mammogram in 03/2013

## 2012-11-05 DIAGNOSIS — I10 Essential (primary) hypertension: Secondary | ICD-10-CM | POA: Diagnosis not present

## 2012-11-05 DIAGNOSIS — Z23 Encounter for immunization: Secondary | ICD-10-CM | POA: Diagnosis not present

## 2012-11-05 DIAGNOSIS — K222 Esophageal obstruction: Secondary | ICD-10-CM | POA: Diagnosis not present

## 2012-11-05 DIAGNOSIS — Z6825 Body mass index (BMI) 25.0-25.9, adult: Secondary | ICD-10-CM | POA: Diagnosis not present

## 2012-11-05 DIAGNOSIS — C50919 Malignant neoplasm of unspecified site of unspecified female breast: Secondary | ICD-10-CM | POA: Diagnosis not present

## 2012-11-05 DIAGNOSIS — E119 Type 2 diabetes mellitus without complications: Secondary | ICD-10-CM | POA: Diagnosis not present

## 2012-11-05 DIAGNOSIS — E785 Hyperlipidemia, unspecified: Secondary | ICD-10-CM | POA: Diagnosis not present

## 2012-11-17 DIAGNOSIS — E119 Type 2 diabetes mellitus without complications: Secondary | ICD-10-CM | POA: Diagnosis not present

## 2012-12-18 ENCOUNTER — Ambulatory Visit (INDEPENDENT_AMBULATORY_CARE_PROVIDER_SITE_OTHER): Payer: Medicare Other | Admitting: Podiatry

## 2012-12-18 ENCOUNTER — Encounter: Payer: Self-pay | Admitting: Podiatry

## 2012-12-18 VITALS — BP 151/78 | HR 80 | Resp 15 | Ht 67.5 in | Wt 164.0 lb

## 2012-12-18 DIAGNOSIS — B351 Tinea unguium: Secondary | ICD-10-CM | POA: Diagnosis not present

## 2012-12-18 DIAGNOSIS — M79609 Pain in unspecified limb: Secondary | ICD-10-CM | POA: Diagnosis not present

## 2012-12-18 NOTE — Progress Notes (Signed)
Pt presents for debridement of elongated, tender toenails.

## 2012-12-19 NOTE — Progress Notes (Signed)
Subjective:     Patient ID: Cindy Wong, female   DOB: 1931/07/16, 79 y.o.   MRN: [id number]  HPI patient presents stating both of my feet her in my nails are tender thick and impossible for me to take care of   Review of Systems     Objective:   Physical Exam  Nursing note and vitals reviewed. Constitutional: She is oriented to person, place, and time.  Musculoskeletal: Normal range of motion.  Neurological: She is oriented to person, place, and time.  Skin: Skin is warm.   nail disease with discomfort of both feet    Assessment:     Mycotic nail infection with pain 1-5 both feet    Plan:     Debridement nailbeds 1-5 both feet with no iatrogenic bleeding noted

## 2013-01-26 ENCOUNTER — Other Ambulatory Visit: Payer: Self-pay

## 2013-01-26 ENCOUNTER — Other Ambulatory Visit (INDEPENDENT_AMBULATORY_CARE_PROVIDER_SITE_OTHER): Payer: Medicare Other

## 2013-01-26 ENCOUNTER — Encounter: Payer: Self-pay | Admitting: Physician Assistant

## 2013-01-26 ENCOUNTER — Ambulatory Visit (INDEPENDENT_AMBULATORY_CARE_PROVIDER_SITE_OTHER): Payer: Medicare Other | Admitting: Physician Assistant

## 2013-01-26 VITALS — BP 136/74 | HR 96 | Ht 67.0 in | Wt 163.0 lb

## 2013-01-26 DIAGNOSIS — R197 Diarrhea, unspecified: Secondary | ICD-10-CM | POA: Diagnosis not present

## 2013-01-26 LAB — BASIC METABOLIC PANEL
BUN: 14 mg/dL (ref 6–23)
CO2: 26 mEq/L (ref 19–32)
Calcium: 9.7 mg/dL (ref 8.4–10.5)
Chloride: 105 mEq/L (ref 96–112)
Creatinine, Ser: 1 mg/dL (ref 0.4–1.2)
GFR: 71.94 mL/min (ref >60.00)
Glucose, Bld: 192 mg/dL — ABNORMAL HIGH (ref 70–99)
Potassium: 4 mEq/L (ref 3.5–5.1)
Sodium: 138 mEq/L (ref 135–145)

## 2013-01-26 LAB — CBC WITH DIFFERENTIAL/PLATELET
Basophils Absolute: 0 10*3/uL (ref 0.0–0.1)
Basophils Relative: 0.3 % (ref 0.0–3.0)
Eosinophils Absolute: 0.1 10*3/uL (ref 0.0–0.7)
Eosinophils Relative: 1.3 % (ref 0.0–5.0)
HCT: 43.9 % (ref 36.0–46.0)
Hemoglobin: 14.5 g/dL (ref 12.0–15.0)
Lymphocytes Relative: 24.6 % (ref 12.0–46.0)
Lymphs Abs: 2 10*3/uL (ref 0.7–4.0)
MCHC: 33 g/dL (ref 30.0–36.0)
MCV: 80.4 fl (ref 78.0–100.0)
Monocytes Absolute: 0.4 10*3/uL (ref 0.1–1.0)
Monocytes Relative: 5.5 % (ref 3.0–12.0)
Neutro Abs: 5.5 10*3/uL (ref 1.4–7.7)
Neutrophils Relative %: 68.3 % (ref 43.0–77.0)
Platelets: 309 10*3/uL (ref 150.0–400.0)
RBC: 5.46 Mil/uL — ABNORMAL HIGH (ref 3.87–5.11)
RDW: 13.5 % (ref 11.5–14.6)
WBC: 8 10*3/uL (ref 4.5–10.5)

## 2013-01-26 MED ORDER — METRONIDAZOLE 250 MG PO TABS: 250.0000 mg | ORAL_TABLET | Freq: Four times a day (QID) | ORAL | Status: AC

## 2013-01-26 MED ORDER — SACCHAROMYCES BOULARDII 250 MG PO CAPS: 250.0000 mg | ORAL_CAPSULE | Freq: Two times a day (BID) | ORAL | Status: DC

## 2013-01-26 NOTE — Progress Notes (Signed)
Subjective:    Patient ID: Cindy Wong, female    DOB: [birth year], 79 y.o.   MRN: [id number]  HPI  [name] is a pleasant 79 year old African American female known to Dr. Ardis Hughs. She has history of DCIS breast cancer in 1993 for which she underwent a lumpectomy and then had a recurrence in 2007. She also has history of a gist tumor of the stomach for which she had a partial gastrectomy in 2007. Last colonoscopy was done in June of 2011 with one diminutive polyp removed this was a tubular adenoma. Last EGD September of 2013 showing a Schatzki's ring and a slightly irregular mucosa at the site of her previous resection. Biopsies were taken and these were benign.  Patient comes in today with complaints of diarrhea over the past 3 weeks. She says that her stools have had an unusually foul odor in that she has had persistent diarrhea with 3-4 bowel movements per day over the past 3 weeks. She's had some mild abdominal cramping intermittently. Her appetite has been fine she's had some vague nausea without vomiting no fever or chills. No nocturnal problems with diarrhea .She has not started on any new medicines or had any recent antibiotics. She has had no  melena or hematochezia.She has noticed a significant fatigue which is unusual for her.    Review of Systems  Constitutional: Positive for fatigue.  HENT: Negative.   Eyes: Negative.   Respiratory: Negative.   Gastrointestinal: Positive for abdominal pain and diarrhea.  Endocrine: Negative.   Genitourinary: Negative.   Musculoskeletal: Negative.   Skin: Negative.   Allergic/Immunologic: Negative.   Neurological: Negative.   Hematological: Negative.   Psychiatric/Behavioral: Negative.    Outpatient Prescriptions Prior to Visit  Medication Sig Dispense Refill  . amLODipine (NORVASC) 5 MG tablet Take 5 mg by mouth daily.      . diazepam (VALIUM) 5 MG tablet Take 5 mg by mouth 2 (two) times daily.        . ferrous sulfate 325 (65 FE) MG  tablet Take 325 mg by mouth daily with breakfast.        . glipiZIDE (GLUCOTROL XL) 10 MG 24 hr tablet Take 10 mg by mouth daily.        . Linagliptin-Metformin HCl (JENTADUETO) 2.5-500 MG TABS Take by mouth.      Marland Kitchen lisinopril (PRINIVIL,ZESTRIL) 20 MG tablet Take 40 mg by mouth daily.       . Multiple Vitamins-Minerals (CENTRUM SILVER PO) Take 1 tablet by mouth daily.       . potassium chloride SA (K-DUR,KLOR-CON) 20 MEQ tablet Take 20 mEq by mouth daily.      . pravastatin (PRAVACHOL) 40 MG tablet Take 40 mg by mouth daily.        . sertraline (ZOLOFT) 50 MG tablet Take 50 mg by mouth daily.         No facility-administered medications prior to visit.   Allergies  Allergen Reactions  . Sulfonamide Derivatives     REACTION: hives   Patient Active Problem List   Diagnosis Date Noted  . DCIS (ductal carcinoma in situ) 10/23/2011  . GIST (gastrointestinal stromal tumor), malignant 10/23/2011  . Fibroid   . Diabetes mellitus   . Cancer   . Hypertension   . Meningitis due to bacterium 02/05/2011  . Chronic mastoiditis of right side 02/05/2011  . BLOOD IN STOOL 07/25/2009   History  Substance Use Topics  . Smoking status: Never Smoker   .  Smokeless tobacco: Never Used  . Alcohol Use: No   family history includes Breast cancer (age of onset: 67) in her sister; Colon cancer in her brother and sister; Diabetes in her brother and sister; Heart disease in her brother; Hypertension in her brother and sister; Melanoma in her sister; Prostate cancer in her brother.     Objective:   Physical Exam   Well-developed older African American female in no acute distress, pleasant blood pressure 136/74 pulse 96 height 5 foot 7 weight 163. HEENT nontraumatic normocephalic EOMI PERRLA sclera anicteric, Supple; no JVD, Cardiovascular; regular rate and rhythm with S1-S2 no murmur or gallop, Pulmonary; clear bilaterally, Abdomen; soft basically nontender there is no palpable mass or hepatosplenomegaly she  does have several incisional scars bowel sounds are active, Rectal; exam not done, Extremities; no clubbing cyanosis or edema skin warm and dry, Psych; mood and affect normal and appropriate       Assessment & Plan:  #67  79 year old African American female with 3 week history of diarrhea with associated fatigue and mild lower abdominal cramping-.suspect infectious etiology. #2 history of Gist tumor of the stomach status post partial gastrectomy 2007 #3 history of breast cancer #4 adenomatous colon polyps last colonoscopy June 2011 1 diminutive polyp plan was for 5 year followup #5 diabetes mellitus  Plan; CBC and BMET were done today and both are unremarkable. Will check GI pathogen panel Soft bland diet over the next several days until feeling better Start Florastor one by mouth twice daily x2 weeks Start Flagyl 250 mg by mouth 4 times daily with food x10 days Further plans pending results of pathogen panel ,and response to Flagyl She will follow up with Dr. Ardis Hughs as needed.

## 2013-01-26 NOTE — Patient Instructions (Signed)
Go to our lab basement level for a stool test.  We sent prescriptions to [facility] [address]  1. Florastor probiotic 2. Metronidazole ( Flagyl)   Eat a soft bland diet for now. We will get back to you with results of the stool study.

## 2013-01-27 ENCOUNTER — Other Ambulatory Visit: Payer: Medicare Other

## 2013-01-27 DIAGNOSIS — R197 Diarrhea, unspecified: Secondary | ICD-10-CM | POA: Diagnosis not present

## 2013-01-27 NOTE — Progress Notes (Signed)
i agree with the plan above, sounds infectious

## 2013-01-28 LAB — GASTROINTESTINAL PATHOGEN PANEL PCR
C. difficile Tox A/B, PCR: POSITIVE — CR
Campylobacter, PCR: POSITIVE — CR
Cryptosporidium, PCR: NEGATIVE
E coli (ETEC) LT/ST PCR: NEGATIVE
E coli (STEC) stx1/stx2, PCR: NEGATIVE
E coli 0157, PCR: NEGATIVE
Giardia lamblia, PCR: NEGATIVE
Norovirus, PCR: NEGATIVE
Rotavirus A, PCR: NEGATIVE
Salmonella, PCR: NEGATIVE
Shigella, PCR: NEGATIVE

## 2013-02-10 ENCOUNTER — Encounter: Payer: Self-pay | Admitting: Physician Assistant

## 2013-02-10 ENCOUNTER — Ambulatory Visit: Payer: Medicare Other | Admitting: Internal Medicine

## 2013-02-10 ENCOUNTER — Ambulatory Visit (INDEPENDENT_AMBULATORY_CARE_PROVIDER_SITE_OTHER): Payer: Medicare Other | Admitting: Physician Assistant

## 2013-02-10 VITALS — BP 140/70 | HR 70 | Ht 67.0 in | Wt 164.0 lb

## 2013-02-10 DIAGNOSIS — C50919 Malignant neoplasm of unspecified site of unspecified female breast: Secondary | ICD-10-CM | POA: Diagnosis not present

## 2013-02-10 DIAGNOSIS — A09 Infectious gastroenteritis and colitis, unspecified: Secondary | ICD-10-CM

## 2013-02-10 DIAGNOSIS — M899 Disorder of bone, unspecified: Secondary | ICD-10-CM | POA: Diagnosis not present

## 2013-02-10 DIAGNOSIS — E1169 Type 2 diabetes mellitus with other specified complication: Secondary | ICD-10-CM | POA: Diagnosis not present

## 2013-02-10 DIAGNOSIS — R718 Other abnormality of red blood cells: Secondary | ICD-10-CM | POA: Diagnosis not present

## 2013-02-10 DIAGNOSIS — E559 Vitamin D deficiency, unspecified: Secondary | ICD-10-CM | POA: Diagnosis not present

## 2013-02-10 DIAGNOSIS — E785 Hyperlipidemia, unspecified: Secondary | ICD-10-CM | POA: Diagnosis not present

## 2013-02-10 DIAGNOSIS — K222 Esophageal obstruction: Secondary | ICD-10-CM | POA: Diagnosis not present

## 2013-02-10 DIAGNOSIS — I10 Essential (primary) hypertension: Secondary | ICD-10-CM | POA: Diagnosis not present

## 2013-02-10 MED ORDER — SACCHAROMYCES BOULARDII 250 MG PO CAPS: 250.0000 mg | ORAL_CAPSULE | Freq: Two times a day (BID) | ORAL | Status: DC

## 2013-02-10 MED ORDER — METRONIDAZOLE 250 MG PO TABS: 250.0000 mg | ORAL_TABLET | Freq: Four times a day (QID) | ORAL | Status: DC

## 2013-02-10 NOTE — Progress Notes (Signed)
Subjective:    Patient ID: Cindy Wong, female    DOB: [birth year], 79 y.o.   MRN: [id number]  HPI  Cindy Wong is a very nice 6 -year-old female who was last seen on December 8 and at that time was felt to have an infectious colitis. She had a GI pathogen panel which came back positive for both C. difficile and Campylobacter. At that time she had been having diarrhea for about 3 weeks which was mild and had some associated abdominal cramping. She was started on a course of Flagyl 250 mg  4 times  times daily for 2 weeks and also Florastor. She comes back in today for followup. She states that she definitely felt much better while on the Flagyl but completed that a few days ago. Today she has not felt as well and has had some looser stools. She denies any abdominal pain or cramping. She also has some stomach upset after eating cream of chicken soup at lunch time. She says today is the first day that she has not felt well.   Review of Systems  Constitutional: Positive for appetite change and fatigue.  HENT: Negative.   Eyes: Negative.   Respiratory: Negative.   Cardiovascular: Negative.   Gastrointestinal: Positive for diarrhea.  Endocrine: Negative.   Genitourinary: Negative.   Musculoskeletal: Negative.   Skin: Negative.   Allergic/Immunologic: Negative.   Neurological: Negative.   Hematological: Negative.   Psychiatric/Behavioral: Negative.    Outpatient Prescriptions Prior to Visit  Medication Sig Dispense Refill  . amLODipine (NORVASC) 5 MG tablet Take 5 mg by mouth daily.      . diazepam (VALIUM) 5 MG tablet Take 5 mg by mouth 2 (two) times daily.        . ferrous sulfate 325 (65 FE) MG tablet Take 325 mg by mouth daily with breakfast.        . glipiZIDE (GLUCOTROL XL) 10 MG 24 hr tablet Take 10 mg by mouth daily.        . Linagliptin-Metformin HCl (JENTADUETO) 2.5-500 MG TABS Take by mouth.      Marland Kitchen lisinopril (PRINIVIL,ZESTRIL) 20 MG tablet Take 40 mg by mouth daily.       .  Multiple Vitamins-Minerals (CENTRUM SILVER PO) Take 1 tablet by mouth daily.       . potassium chloride SA (K-DUR,KLOR-CON) 20 MEQ tablet Take 20 mEq by mouth daily.      . pravastatin (PRAVACHOL) 40 MG tablet Take 40 mg by mouth daily.        . sertraline (ZOLOFT) 50 MG tablet Take 50 mg by mouth daily.        Marland Kitchen saccharomyces boulardii (FLORASTOR) 250 MG capsule Take 1 capsule (250 mg total) by mouth 2 (two) times daily.  60 capsule  1   No facility-administered medications prior to visit.   Allergies  Allergen Reactions  . Sulfonamide Derivatives     REACTION: hives   Patient Active Problem List   Diagnosis Date Noted  . DCIS (ductal carcinoma in situ) 10/23/2011  . GIST (gastrointestinal stromal tumor), malignant 10/23/2011  . Fibroid   . Diabetes mellitus   . Cancer   . Hypertension   . Meningitis due to bacterium 02/05/2011  . Chronic mastoiditis of right side 02/05/2011  . BLOOD IN STOOL 07/25/2009   History  Substance Use Topics  . Smoking status: Never Smoker   . Smokeless tobacco: Never Used  . Alcohol Use: No   family history includes Breast  cancer (age of onset: 77) in her sister; Colon cancer in her brother and sister; Diabetes in her brother and sister; Heart disease in her brother; Hypertension in her brother and sister; Melanoma in her sister; Prostate cancer in her brother.     Objective:   Physical Exam  well-developed older African American female in no acute distress, pleasant blood pressure 140/70 pulse 70 height 5 foot 7 weight 164. HEENT; nontraumatic normocephalic EOMI PERRLA sclera anicteric, Supple; no JVD, Cardiovascular; regular rate and rhythm with S1-S2 no murmur or gallop, Pulm;clear bilaterally, Abdomen ;soft basically nontender nondistended bowel sounds are active there is no palpable mass or hepatosplenomegaly, Extremities; no clubbing cyanosis or edema, Psych ;mood  and affect appropriate        Assessment & Plan:  #10 79 year old [place]  American female with C. difficile colitis. She has just completed a course of Flagyl and had resolution of her symptoms while on Flagyl. Now over the past 24 hours with mild GI upset and looser stools. At this time it is unclear whether this may be a relapse   Plan; Will continue Florastor twice daily for another couple of weeks Bland diet over the next few days Have called in another prescription for Flagyl 250 mg by mouth 4 times daily x10 more days. I have asked her to observe her symptoms over the next 24-48 hours and if she continues to have looser stools and nausea then she will resume the Flagyl She will call back in a week with a progress report.

## 2013-02-10 NOTE — Patient Instructions (Addendum)
We have sent the following medications to your pharmacy for you to pick up at your convenience: Flagyl, Florastor  Take Florastor one twice a day for 14 days.  Today you have been given samples and a coupon.  Call us back next Wednesday with a progress report.   I appreciate the opportunity to care for you.

## 2013-02-11 NOTE — Progress Notes (Signed)
i agree with repeat course of abx

## 2013-02-20 ENCOUNTER — Telehealth: Payer: Self-pay | Admitting: Physician Assistant

## 2013-02-20 NOTE — Telephone Encounter (Signed)
Good, needs to complete the course

## 2013-02-20 NOTE — Telephone Encounter (Signed)
Patient advised.

## 2013-03-10 ENCOUNTER — Other Ambulatory Visit: Payer: Self-pay

## 2013-03-10 DIAGNOSIS — Z853 Personal history of malignant neoplasm of breast: Secondary | ICD-10-CM

## 2013-03-10 DIAGNOSIS — Z9011 Acquired absence of right breast and nipple: Secondary | ICD-10-CM

## 2013-03-10 DIAGNOSIS — Z1231 Encounter for screening mammogram for malignant neoplasm of breast: Secondary | ICD-10-CM

## 2013-03-12 ENCOUNTER — Ambulatory Visit: Payer: Medicare Other | Admitting: Podiatry

## 2013-03-30 ENCOUNTER — Encounter: Payer: Self-pay | Admitting: Podiatry

## 2013-03-30 ENCOUNTER — Ambulatory Visit (INDEPENDENT_AMBULATORY_CARE_PROVIDER_SITE_OTHER): Payer: Medicare Other | Admitting: Podiatry

## 2013-03-30 VITALS — BP 140/74 | HR 92 | Resp 12

## 2013-03-30 DIAGNOSIS — B351 Tinea unguium: Secondary | ICD-10-CM

## 2013-03-30 DIAGNOSIS — M79609 Pain in unspecified limb: Secondary | ICD-10-CM | POA: Diagnosis not present

## 2013-03-31 NOTE — Progress Notes (Signed)
Subjective:     Patient ID: EDOM MUNOS, female   DOB: 1931-12-26, 80 y.o.   MRN: [id number]  HPI presents with thick painful nail bed 1-5 both feet that are making shoe gear difficult and impossible for her to   Review of Systems     Objective:   Physical Exam Neurovascular status unchanged with thick painful nailbeds 1-5 both feet that are impossible for her to take care    Assessment:     Mycotic nail infections with pain 1-5 both feet    Plan:     Debridement of painful nailbeds 1-5 both feet with no bleeding noted

## 2013-04-01 DIAGNOSIS — D2370 Other benign neoplasm of skin of unspecified lower limb, including hip: Secondary | ICD-10-CM | POA: Diagnosis not present

## 2013-04-06 ENCOUNTER — Ambulatory Visit
Admission: RE | Admit: 2013-04-06 | Discharge: 2013-04-06 | Disposition: A | Discharge status: Home / Self Care | Payer: Medicare Other | Source: Ambulatory Visit

## 2013-04-06 DIAGNOSIS — Z9011 Acquired absence of right breast and nipple: Secondary | ICD-10-CM

## 2013-04-06 DIAGNOSIS — Z1231 Encounter for screening mammogram for malignant neoplasm of breast: Secondary | ICD-10-CM

## 2013-04-06 DIAGNOSIS — Z853 Personal history of malignant neoplasm of breast: Secondary | ICD-10-CM

## 2013-05-30 ENCOUNTER — Emergency Department (HOSPITAL_COMMUNITY)
Admission: EM | Admit: 2013-05-30 | Discharge: 2013-05-31 | Disposition: A | Discharge status: Home / Self Care | Payer: Medicare Other | Attending: Emergency Medicine | Admitting: Emergency Medicine

## 2013-05-30 ENCOUNTER — Encounter (HOSPITAL_COMMUNITY): Payer: Self-pay | Admitting: Emergency Medicine

## 2013-05-30 DIAGNOSIS — Z8669 Personal history of other diseases of the nervous system and sense organs: Secondary | ICD-10-CM | POA: Diagnosis not present

## 2013-05-30 DIAGNOSIS — E119 Type 2 diabetes mellitus without complications: Secondary | ICD-10-CM | POA: Insufficient documentation

## 2013-05-30 DIAGNOSIS — Z79899 Other long term (current) drug therapy: Secondary | ICD-10-CM | POA: Diagnosis not present

## 2013-05-30 DIAGNOSIS — E785 Hyperlipidemia, unspecified: Secondary | ICD-10-CM | POA: Diagnosis not present

## 2013-05-30 DIAGNOSIS — L02419 Cutaneous abscess of limb, unspecified: Secondary | ICD-10-CM | POA: Diagnosis not present

## 2013-05-30 DIAGNOSIS — Z85028 Personal history of other malignant neoplasm of stomach: Secondary | ICD-10-CM | POA: Insufficient documentation

## 2013-05-30 DIAGNOSIS — L03119 Cellulitis of unspecified part of limb: Secondary | ICD-10-CM | POA: Insufficient documentation

## 2013-05-30 DIAGNOSIS — Z853 Personal history of malignant neoplasm of breast: Secondary | ICD-10-CM | POA: Diagnosis not present

## 2013-05-30 DIAGNOSIS — L0390 Cellulitis, unspecified: Secondary | ICD-10-CM

## 2013-05-30 DIAGNOSIS — I10 Essential (primary) hypertension: Secondary | ICD-10-CM | POA: Insufficient documentation

## 2013-05-30 LAB — CBG MONITORING, ED: Glucose-Capillary: 85 mg/dL (ref 70–99)

## 2013-05-30 LAB — CBC WITH DIFFERENTIAL/PLATELET
Basophils Absolute: 0 10*3/uL (ref 0.0–0.1)
Basophils Relative: 0 % (ref 0–1)
Eosinophils Absolute: 0.2 10*3/uL (ref 0.0–0.7)
Eosinophils Relative: 3 % (ref 0–5)
HCT: 46.7 % — ABNORMAL HIGH (ref 36.0–46.0)
Hemoglobin: 14.6 g/dL (ref 12.0–15.0)
Lymphocytes Relative: 24 % (ref 12–46)
Lymphs Abs: 2.3 10*3/uL (ref 0.7–4.0)
MCH: 26.5 pg (ref 26.0–34.0)
MCHC: 31.3 g/dL (ref 30.0–36.0)
MCV: 84.9 fL (ref 78.0–100.0)
Monocytes Absolute: 0.5 10*3/uL (ref 0.1–1.0)
Monocytes Relative: 6 % (ref 3–12)
Neutro Abs: 6.6 10*3/uL (ref 1.7–7.7)
Neutrophils Relative %: 68 % (ref 43–77)
Platelets: 293 10*3/uL (ref 150–400)
RBC: 5.5 MIL/uL — ABNORMAL HIGH (ref 3.87–5.11)
RDW: 13.4 % (ref 11.5–15.5)
WBC: 9.6 10*3/uL (ref 4.0–10.5)

## 2013-05-30 MED ORDER — CLINDAMYCIN HCL 300 MG PO CAPS
600.0000 mg | ORAL_CAPSULE | Freq: Once | ORAL | Status: AC
  Administered 2013-05-31: 600 mg via ORAL
  Filled 2013-05-30: qty 2

## 2013-05-30 MED ORDER — CLINDAMYCIN HCL 300 MG PO CAPS: 600.0000 mg | ORAL_CAPSULE | Freq: Three times a day (TID) | ORAL | Status: DC

## 2013-05-30 NOTE — ED Notes (Signed)
Patient is alert and oriented x3.  She is complaining of left leg pain that started on  Thursday.  The left leg has redness, itching and heat.

## 2013-05-30 NOTE — Discharge Instructions (Signed)
Take antibiotics as directed for skin infection. Follow up with your doctor in 2 days for reevaluation of infection. May take OTC benadryl or zrytec as directed on the bottle for itching sensation. Return to Emergency department if you notice worsening swelling or spread of skin infection, fevers, or leg swelling.    Cellulitis Cellulitis is an infection of the skin and the tissue under the skin. The infected area is usually red and tender. This happens most often in the arms and lower legs. HOME CARE   Take your antibiotic medicine as told. Finish the medicine even if you start to feel better.  Keep the infected arm or leg raised (elevated).  Put a warm cloth on the area up to 4 times per day.  Only take medicines as told by your doctor.  Keep all doctor visits as told. GET HELP RIGHT AWAY IF:   You have a fever.  You feel very sleepy.  You throw up (vomit) or have watery poop (diarrhea).  You feel sick and have muscle aches and pains.  You see red streaks on the skin coming from the infected area.  Your red area gets bigger or turns a dark color.  Your bone or joint under the infected area is painful after the skin heals.  Your infection comes back in the same area or different area.  You have a puffy (swollen) bump in the infected area.  You have new symptoms. MAKE SURE YOU:   Understand these instructions.  Will watch your condition.  Will get help right away if you are not doing well or get worse. Document Released: 07/25/2007 Document Revised: 08/07/2011 Document Reviewed: 04/23/2011 [facility] Patient Information 2014 [place], [place].

## 2013-05-30 NOTE — ED Provider Notes (Signed)
CSN: [id number]     Arrival date & time 05/30/13  2202 History   First MD Initiated Contact with Patient 05/30/13 2217     Chief Complaint  Patient presents with  . Leg Pain    redness and pain     (Consider location/radiation/quality/duration/timing/severity/associated sxs/prior Treatment) HPI 80 yo female with hx of DM presents with left leg pain that she describes as "sore". Patient states first started on Thursday when she noticed a "black bug... Like a beetle" crawling up her leg. THinks maybe it bit her. Patietn states it started itching and now swelling a little and red. Deneis any fever/chills though states leg feels hot. Denies CP, SOB, Dizziness, numbness, or weakness. Patient states her blood sugars are well controlled. Last hgb A1c was "6.7 in December".   Past Medical History  Diagnosis Date  . Diabetes mellitus   . Breast cancer   . Hypertension   . Hyperlipemia   . Hx of meningitis 2012  . Stomach cancer    Past Surgical History  Procedure Laterality Date  . Cholecystectomy    . Abdominal hysterectomy  1974    Supracervical  . Stomach tumor    . Breast lumpectomy Right 1993  . Oophorectomy      One ovary removed--?side  . Meningitis    . Mastectomy Right    Family History  Problem Relation Age of Onset  . Breast cancer Sister 55  . Diabetes Sister   . Hypertension Sister   . Colon cancer Sister   . Melanoma Sister   . Heart disease Brother   . Diabetes Brother   . Hypertension Brother   . Prostate cancer Brother   . Colon cancer Brother    History  Substance Use Topics  . Smoking status: Never Smoker   . Smokeless tobacco: Never Used  . Alcohol Use: No   OB History   Grav Para Term Preterm Abortions TAB SAB Ect Mult Living   1 1  1       0     Review of Systems  All other systems reviewed and are negative.     Allergies  Sulfonamide derivatives  Home Medications   Current Outpatient Rx  Name  Route  Sig  Dispense  Refill  .  amLODipine (NORVASC) 5 MG tablet   Oral   Take 5 mg by mouth daily.         . Calcium Carbonate-Vitamin D (CALTRATE 600+D) 600-400 MG-UNIT per tablet   Oral   Take 2 tablets by mouth daily.         . diazepam (VALIUM) 5 MG tablet   Oral   Take 2.5-5 mg by mouth 2 (two) times daily.          . ferrous sulfate 325 (65 FE) MG tablet   Oral   Take 325 mg by mouth daily with breakfast.           . glipiZIDE (GLUCOTROL XL) 10 MG 24 hr tablet   Oral   Take 10 mg by mouth daily.           . Linagliptin-Metformin HCl (JENTADUETO) 2.5-500 MG TABS   Oral   Take 1 tablet by mouth 2 (two) times daily.          Marland Kitchen lisinopril (PRINIVIL,ZESTRIL) 20 MG tablet   Oral   Take 20 mg by mouth daily.          . Multiple Vitamins-Minerals (CENTRUM SILVER PO)   Oral  Take 1 tablet by mouth daily.          . potassium chloride SA (K-DUR,KLOR-CON) 20 MEQ tablet   Oral   Take 20 mEq by mouth daily.         . pravastatin (PRAVACHOL) 40 MG tablet   Oral   Take 40 mg by mouth daily.           . sertraline (ZOLOFT) 50 MG tablet   Oral   Take 50 mg by mouth daily.           Marland Kitchen triamterene-hydrochlorothiazide (MAXZIDE-25) 37.5-25 MG per tablet   Oral   Take 1 tablet by mouth daily.         . clindamycin (CLEOCIN) 300 MG capsule   Oral   Take 2 capsules (600 mg total) by mouth 3 (three) times daily.   42 capsule   0    BP 153/78  Pulse 63  Temp(Src) 97.4 F (36.3 C) (Oral)  Resp 16  SpO2 97% Physical Exam  Nursing note and vitals reviewed. Constitutional: She is oriented to person, place, and time. She appears well-developed and well-nourished. No distress.  HENT:  Head: Normocephalic and atraumatic.  Eyes: Conjunctivae are normal. No scleral icterus.  Neck: Normal range of motion. Neck supple. No JVD present. No tracheal deviation present.  Cardiovascular: Normal rate and regular rhythm.  Exam reveals no gallop and no friction rub.   No murmur  heard. Pulmonary/Chest: Effort normal and breath sounds normal. No respiratory distress. She has no wheezes. She has no rhonchi. She has no rales.  Abdominal: Soft. Bowel sounds are normal. She exhibits no distension. There is no hepatosplenomegaly. There is no tenderness. There is no rigidity, no rebound, no guarding, no tenderness at McBurney's point and negative Murphy's sign.  Musculoskeletal: Normal range of motion. She exhibits no edema.       Legs: Erythema with warmth, suggestive of cellulitis as depicted above in diagram.     Neurological: She is alert and oriented to person, place, and time.  Skin: Skin is warm and dry. She is not diaphoretic.  Psychiatric: She has a normal mood and affect. Her behavior is normal.    ED Course  Procedures (including critical care time) Labs Review Labs Reviewed  CBC WITH DIFFERENTIAL - Abnormal; Notable for the following:    RBC 5.50 (*)    HCT 46.7 (*)    All other components within normal limits  CBG MONITORING, ED   Imaging Review No results found.   EKG Interpretation None      MDM   Final diagnoses:  Cellulitis   Patient afebrile. Elevated BP was discussed with patient. Improved prior to discharge. Recommend check f/u with PCP for recheck.  Filed Vitals:   05/30/13 2210 05/31/13 0052  BP: 174/85 153/78  Pulse: 73 63  Temp: 97.4 F (36.3 C)   TempSrc: Oral   Resp: 18 16  SpO2: 99% 97%  CBC WNL No evidence of systemic infection. Exam and hx consistent with cellulitis, likely secondary to insect bite. Patient denies "black bug" resembling any type of tick. No evidence of target lesion. No systemic symptoms reported. Patient started on Clindamycin. First dose given in ED. Advised patient to use OTC antihistamines for itching. Plan to have patient follow up with PCP or ED in 2 days for recheck of suspected cellulitis.Borders of cellulitis marked with skin marker. Return precautions given for sooner follow up if develop fever  worsening pain, spreading erythema, or swelling  of affected leg.  Patient confirms understanding. Agrees with plan. Discharged in good condition.  Meds given in ED:  Medications  clindamycin (CLEOCIN) capsule 600 mg (600 mg Oral Given 05/31/13 0005)    Discharge Medication List as of 05/30/2013 11:39 PM    START taking these medications   Details  clindamycin (CLEOCIN) 300 MG capsule Take 2 capsules (600 mg total) by mouth 3 (three) times daily., Starting 05/30/2013, Until Discontinued, Print           Sherrie George, [place] 05/31/13 1341

## 2013-05-30 NOTE — ED Notes (Signed)
PA at the bedside.

## 2013-05-30 NOTE — ED Notes (Signed)
Pt's left lower calf is warm, red, edematous and itching. Pt denies any pain at this time.

## 2013-05-31 NOTE — ED Provider Notes (Signed)
Medical screening examination/treatment/procedure(s) were conducted as a shared visit with non-physician practitioner(s) or resident and myself. I personally evaluated the patient during the encounter and agree with the findings and plan unless otherwise indicated.  I have personally reviewed any xrays and/ or EKG's with the provider and I agree with interpretation.  Left leg redness, warmth and itching worsening since Thursday, possible bug bite. No hx of skin infections. No fevers or systemic sxs. Well appearing, well demarcated region anterior tibia with erythema/ mild purpura like appearance, warmth, mild tender. No injuries. Likely cellulitis, CBC to check platelets.  Outpatient fup discussed.  Labs Reviewed   CBC WITH DIFFERENTIAL    Left leg rash/ cellulitis   Mariea Clonts, MD 05/31/13 [phone]

## 2013-06-02 DIAGNOSIS — L03119 Cellulitis of unspecified part of limb: Secondary | ICD-10-CM | POA: Diagnosis not present

## 2013-06-02 DIAGNOSIS — L02419 Cutaneous abscess of limb, unspecified: Secondary | ICD-10-CM | POA: Diagnosis not present

## 2013-06-02 DIAGNOSIS — Z6825 Body mass index (BMI) 25.0-25.9, adult: Secondary | ICD-10-CM | POA: Diagnosis not present

## 2013-06-29 ENCOUNTER — Encounter: Payer: Self-pay | Admitting: Podiatry

## 2013-06-29 ENCOUNTER — Ambulatory Visit (INDEPENDENT_AMBULATORY_CARE_PROVIDER_SITE_OTHER): Payer: Medicare Other | Admitting: Podiatry

## 2013-06-29 VITALS — BP 158/78 | HR 82 | Resp 16

## 2013-06-29 DIAGNOSIS — M79609 Pain in unspecified limb: Secondary | ICD-10-CM

## 2013-06-29 DIAGNOSIS — B351 Tinea unguium: Secondary | ICD-10-CM

## 2013-06-29 NOTE — Progress Notes (Signed)
Subjective:     Patient ID: Cindy Wong, female   DOB: 07-30-1931, 80 y.o.   MRN: [id number]  HPI patient presents with thick nails 1-5 both feet that she cannot take care of herself   Review of Systems     Objective:   Physical Exam Neurovascular status unchanged with thick nailbeds 1-5 both feet that are brittle and yellow in appearance    Assessment:     Mycotic nail infection with pain 1-5 both feet    Plan:     Debridement painful nailbeds 1-5 both feet no bleeding noted

## 2013-06-29 NOTE — Patient Instructions (Signed)
Diabetes and Foot Care Diabetes may cause you to have problems because of poor blood supply (circulation) to your feet and legs. This may cause the skin on your feet to become thinner, break easier, and heal more slowly. Your skin may become dry, and the skin may peel and crack. You may also have nerve damage in your legs and feet causing decreased feeling in them. You may not notice minor injuries to your feet that could lead to infections or more serious problems. Taking care of your feet is one of the most important things you can do for yourself.  HOME CARE INSTRUCTIONS  Wear shoes at all times, even in the house. Do not go barefoot. Bare feet are easily injured.  Check your feet daily for blisters, cuts, and redness. If you cannot see the bottom of your feet, use a mirror or ask someone for help.  Wash your feet with warm water (do not use hot water) and mild soap. Then pat your feet and the areas between your toes until they are completely dry. Do not soak your feet as this can dry your skin.  Apply a moisturizing lotion or petroleum jelly (that does not contain alcohol and is unscented) to the skin on your feet and to dry, brittle toenails. Do not apply lotion between your toes.  Trim your toenails straight across. Do not dig under them or around the cuticle. File the edges of your nails with an emery board or nail file.  Do not cut corns or calluses or try to remove them with medicine.  Wear clean socks or stockings every day. Make sure they are not too tight. Do not wear knee-high stockings since they may decrease blood flow to your legs.  Wear shoes that fit properly and have enough cushioning. To break in new shoes, wear them for just a few hours a day. This prevents you from injuring your feet. Always look in your shoes before you put them on to be sure there are no objects inside.  Do not cross your legs. This may decrease the blood flow to your feet.  If you find a minor scrape,  cut, or break in the skin on your feet, keep it and the skin around it clean and dry. These areas may be cleansed with mild soap and water. Do not cleanse the area with peroxide, alcohol, or iodine.  When you remove an adhesive bandage, be sure not to damage the skin around it.  If you have a wound, look at it several times a day to make sure it is healing.  Do not use heating pads or hot water bottles. They may burn your skin. If you have lost feeling in your feet or legs, you may not know it is happening until it is too late.  Make sure your health care provider performs a complete foot exam at least annually or more often if you have foot problems. Report any cuts, sores, or bruises to your health care provider immediately. SEEK MEDICAL CARE IF:   You have an injury that is not healing.  You have cuts or breaks in the skin.  You have an ingrown nail.  You notice redness on your legs or feet.  You feel burning or tingling in your legs or feet.  You have pain or cramps in your legs and feet.  Your legs or feet are numb.  Your feet always feel cold. SEEK IMMEDIATE MEDICAL CARE IF:   There is increasing redness,   swelling, or pain in or around a wound.  There is a red line that goes up your leg.  Pus is coming from a wound.  You develop a fever or as directed by your health care provider.  You notice a bad smell coming from an ulcer or wound. Document Released: 02/03/2000 Document Revised: 10/08/2012 Document Reviewed: 07/15/2012 Exit[employer] Patient Information 2014 [employer].  

## 2013-07-28 DIAGNOSIS — E119 Type 2 diabetes mellitus without complications: Secondary | ICD-10-CM | POA: Diagnosis not present

## 2013-08-04 DIAGNOSIS — Z0000 Encounter for general adult medical examination without abnormal findings: Secondary | ICD-10-CM | POA: Diagnosis not present

## 2013-08-04 DIAGNOSIS — I10 Essential (primary) hypertension: Secondary | ICD-10-CM | POA: Diagnosis not present

## 2013-08-04 DIAGNOSIS — E785 Hyperlipidemia, unspecified: Secondary | ICD-10-CM | POA: Diagnosis not present

## 2013-08-04 DIAGNOSIS — M949 Disorder of cartilage, unspecified: Secondary | ICD-10-CM | POA: Diagnosis not present

## 2013-08-04 DIAGNOSIS — M899 Disorder of bone, unspecified: Secondary | ICD-10-CM | POA: Diagnosis not present

## 2013-08-04 DIAGNOSIS — Z6825 Body mass index (BMI) 25.0-25.9, adult: Secondary | ICD-10-CM | POA: Diagnosis not present

## 2013-08-04 DIAGNOSIS — K222 Esophageal obstruction: Secondary | ICD-10-CM | POA: Diagnosis not present

## 2013-08-04 DIAGNOSIS — Z9010 Acquired absence of unspecified breast and nipple: Secondary | ICD-10-CM | POA: Diagnosis not present

## 2013-08-04 DIAGNOSIS — E119 Type 2 diabetes mellitus without complications: Secondary | ICD-10-CM | POA: Diagnosis not present

## 2013-08-04 DIAGNOSIS — E559 Vitamin D deficiency, unspecified: Secondary | ICD-10-CM | POA: Diagnosis not present

## 2013-10-05 ENCOUNTER — Ambulatory Visit (INDEPENDENT_AMBULATORY_CARE_PROVIDER_SITE_OTHER): Payer: Medicare Other | Admitting: Podiatry

## 2013-10-05 DIAGNOSIS — M79609 Pain in unspecified limb: Secondary | ICD-10-CM

## 2013-10-05 DIAGNOSIS — B351 Tinea unguium: Secondary | ICD-10-CM

## 2013-10-05 DIAGNOSIS — M79673 Pain in unspecified foot: Secondary | ICD-10-CM

## 2013-10-05 NOTE — Progress Notes (Signed)
   Subjective:    Patient ID: Cindy Wong, female    DOB: [birth year], 80 y.o.   MRN: [id number]  HPI Pt presents for nail debridement    Review of Systems     Objective:   Physical Exam        Assessment & Plan:

## 2013-10-05 NOTE — Patient Instructions (Signed)
Diabetes and Foot Care Diabetes may cause you to have problems because of poor blood supply (circulation) to your feet and legs. This may cause the skin on your feet to become thinner, break easier, and heal more slowly. Your skin may become dry, and the skin may peel and crack. You may also have nerve damage in your legs and feet causing decreased feeling in them. You may not notice minor injuries to your feet that could lead to infections or more serious problems. Taking care of your feet is one of the most important things you can do for yourself.  HOME CARE INSTRUCTIONS  Wear shoes at all times, even in the house. Do not go barefoot. Bare feet are easily injured.  Check your feet daily for blisters, cuts, and redness. If you cannot see the bottom of your feet, use a mirror or ask someone for help.  Wash your feet with warm water (do not use hot water) and mild soap. Then pat your feet and the areas between your toes until they are completely dry. Do not soak your feet as this can dry your skin.  Apply a moisturizing lotion or petroleum jelly (that does not contain alcohol and is unscented) to the skin on your feet and to dry, brittle toenails. Do not apply lotion between your toes.  Trim your toenails straight across. Do not dig under them or around the cuticle. File the edges of your nails with an emery board or nail file.  Do not cut corns or calluses or try to remove them with medicine.  Wear clean socks or stockings every day. Make sure they are not too tight. Do not wear knee-high stockings since they may decrease blood flow to your legs.  Wear shoes that fit properly and have enough cushioning. To break in new shoes, wear them for just a few hours a day. This prevents you from injuring your feet. Always look in your shoes before you put them on to be sure there are no objects inside.  Do not cross your legs. This may decrease the blood flow to your feet.  If you find a minor scrape,  cut, or break in the skin on your feet, keep it and the skin around it clean and dry. These areas may be cleansed with mild soap and water. Do not cleanse the area with peroxide, alcohol, or iodine.  When you remove an adhesive bandage, be sure not to damage the skin around it.  If you have a wound, look at it several times a day to make sure it is healing.  Do not use heating pads or hot water bottles. They may burn your skin. If you have lost feeling in your feet or legs, you may not know it is happening until it is too late.  Make sure your health care provider performs a complete foot exam at least annually or more often if you have foot problems. Report any cuts, sores, or bruises to your health care provider immediately. SEEK MEDICAL CARE IF:   You have an injury that is not healing.  You have cuts or breaks in the skin.  You have an ingrown nail.  You notice redness on your legs or feet.  You feel burning or tingling in your legs or feet.  You have pain or cramps in your legs and feet.  Your legs or feet are numb.  Your feet always feel cold. SEEK IMMEDIATE MEDICAL CARE IF:   There is increasing redness,   swelling, or pain in or around a wound.  There is a red line that goes up your leg.  Pus is coming from a wound.  You develop a fever or as directed by your health care provider.  You notice a bad smell coming from an ulcer or wound. Document Released: 02/03/2000 Document Revised: 10/08/2012 Document Reviewed: 07/15/2012 Exit[employer] Patient Information 2015 [employer]. This information is not intended to replace advice given to you by your health care provider. Make sure you discuss any questions you have with your health care provider.  

## 2013-10-07 NOTE — Progress Notes (Signed)
Subjective:     Patient ID: Cindy Wong, female   DOB: 1931-08-29, 80 y.o.   MRN: [id number]  HPI patient presents with thick E. long gaited nailbeds 1-5 of both feet that are painful and unable to the patient to cut   Review of Systems     Objective:   Physical Exam Neurovascular status unchanged patient well oriented x3 with thick yellow brittle nailbeds 1-5 both feet    Assessment:     Mycotic nail infection with pain 1-5 both feet    Plan:     Debris painful nailbeds 1-5 both feet with no iatrogenic bleeding noted

## 2013-10-13 DIAGNOSIS — I10 Essential (primary) hypertension: Secondary | ICD-10-CM | POA: Diagnosis not present

## 2013-10-13 DIAGNOSIS — Z85028 Personal history of other malignant neoplasm of stomach: Secondary | ICD-10-CM | POA: Diagnosis not present

## 2013-10-13 DIAGNOSIS — Z5189 Encounter for other specified aftercare: Secondary | ICD-10-CM | POA: Diagnosis not present

## 2013-10-13 DIAGNOSIS — Z853 Personal history of malignant neoplasm of breast: Secondary | ICD-10-CM | POA: Diagnosis not present

## 2013-10-13 DIAGNOSIS — E785 Hyperlipidemia, unspecified: Secondary | ICD-10-CM | POA: Diagnosis not present

## 2013-10-13 DIAGNOSIS — H902 Conductive hearing loss, unspecified: Secondary | ICD-10-CM | POA: Diagnosis not present

## 2013-10-13 DIAGNOSIS — E119 Type 2 diabetes mellitus without complications: Secondary | ICD-10-CM | POA: Diagnosis not present

## 2013-10-23 ENCOUNTER — Ambulatory Visit (HOSPITAL_BASED_OUTPATIENT_CLINIC_OR_DEPARTMENT_OTHER): Payer: Medicare Other | Admitting: Hematology

## 2013-10-23 ENCOUNTER — Other Ambulatory Visit (HOSPITAL_BASED_OUTPATIENT_CLINIC_OR_DEPARTMENT_OTHER): Payer: Medicare Other

## 2013-10-23 VITALS — BP 160/77 | HR 78 | Temp 98.5°F | Resp 18 | Ht 67.0 in | Wt 166.4 lb

## 2013-10-23 DIAGNOSIS — Z853 Personal history of malignant neoplasm of breast: Secondary | ICD-10-CM

## 2013-10-23 DIAGNOSIS — D0511 Intraductal carcinoma in situ of right breast: Secondary | ICD-10-CM

## 2013-10-23 DIAGNOSIS — Z8589 Personal history of malignant neoplasm of other organs and systems: Secondary | ICD-10-CM

## 2013-10-23 LAB — COMPREHENSIVE METABOLIC PANEL (CC13)
ALT: 15 U/L (ref 0–55)
AST: 17 U/L (ref 5–34)
Albumin: 3.6 g/dL (ref 3.5–5.0)
Alkaline Phosphatase: 53 U/L (ref 40–150)
Anion Gap: 10 mEq/L (ref 3–11)
BUN: 9.9 mg/dL (ref 7.0–26.0)
CO2: 25 mEq/L (ref 22–29)
Calcium: 9.5 mg/dL (ref 8.4–10.4)
Chloride: 107 mEq/L (ref 98–109)
Creatinine: 0.8 mg/dL (ref 0.6–1.1)
Glucose: 165 mg/dl — ABNORMAL HIGH (ref 70–140)
Potassium: 3.9 mEq/L (ref 3.5–5.1)
Sodium: 142 mEq/L (ref 136–145)
Total Bilirubin: 0.3 mg/dL (ref 0.20–1.20)
Total Protein: 6.8 g/dL (ref 6.4–8.3)

## 2013-10-23 LAB — CBC WITH DIFFERENTIAL/PLATELET
BASO%: 1.6 % (ref 0.0–2.0)
Basophils Absolute: 0.1 10*3/uL (ref 0.0–0.1)
EOS%: 2.9 % (ref 0.0–7.0)
Eosinophils Absolute: 0.2 10*3/uL (ref 0.0–0.5)
HCT: 44.4 % (ref 34.8–46.6)
HGB: 14 g/dL (ref 11.6–15.9)
LYMPH%: 30.2 % (ref 14.0–49.7)
MCH: 26 pg (ref 25.1–34.0)
MCHC: 31.6 g/dL (ref 31.5–36.0)
MCV: 82.5 fL (ref 79.5–101.0)
MONO#: 0.5 10*3/uL (ref 0.1–0.9)
MONO%: 6.1 % (ref 0.0–14.0)
NEUT#: 4.9 10*3/uL (ref 1.5–6.5)
NEUT%: 59.2 % (ref 38.4–76.8)
Platelets: 314 10*3/uL (ref 145–400)
RBC: 5.38 10*6/uL (ref 3.70–5.45)
RDW: 13.5 % (ref 11.2–14.5)
WBC: 8.3 10*3/uL (ref 3.9–10.3)
lymph#: 2.5 10*3/uL (ref 0.9–3.3)

## 2013-10-23 LAB — TECHNOLOGIST REVIEW

## 2013-10-25 ENCOUNTER — Encounter: Payer: Self-pay | Admitting: Hematology

## 2013-10-25 NOTE — Progress Notes (Signed)
Hematology and Oncology Follow Up Visit  Cindy Wong [id number] [birth year] 80 y.o. 10/25/2013 4:01 PM [name], MD  Principle Diagnosises: Ductal carcinoma in situ  (DCIS) of the right breast and a history of gastrointestinal stromal tumor (GIST)  Prior Therapy:  . DCIS right breast (1993) treated with lumpectomy and radiation treatments.  She  developed recurrent DCIS in 08/03/2005 and  underwent a right-sided mastectomy on 08/24/2005.  GIST involving the stomach s/p partial gastrectomy on 03/24/1995 without evidence of recurrence.  Current therapy: None  Interim History:  The patient was last seen by Dr. Juliann Mule on 10/23/2012 and prior to that by Dr Ralene Ok on 10/23/2011.  She has been doing well.  She had a mammogram on 04/06/2013 left breast and it was BIRADS category 1 negative exam.   She is being followed by Dr. Ardis Hughs.  She had an upper endoscopy on 11/06/2011.  Her last colonoscopy was done on 08/03/2009. She gets it every 5 years and next one should be i 2016.  She denies any abdominal pain. No new breast lump or masses felt. She is without complaints today.  She is doing very well.  At some point she was taking Fosamax for Osteoporosis (as per chart review) but patient does not remember having osteoporosis but admit to getting a bone density test every 2 years and will check with her primary about it. She is taking calcium and D supplements but not on any bisphosphonate's currently. She did ask for a sleeping aid but instead of taking prescription medicine will try the Melatonin first through a pharmacy.  Medications: I have reviewed the patient's current medications.  Current Outpatient Prescriptions  Medication Sig Dispense Refill  . amLODipine (NORVASC) 5 MG tablet Take 5 mg by mouth daily.      . Calcium Carbonate-Vitamin D (CALTRATE 600+D) 600-400 MG-UNIT per tablet Take 2 tablets by mouth daily.      . clindamycin (CLEOCIN) 300 MG capsule Take 2 capsules (600 mg total) by  mouth 3 (three) times daily.  42 capsule  0  . diazepam (VALIUM) 5 MG tablet Take 2.5-5 mg by mouth 2 (two) times daily.       . ferrous sulfate 325 (65 FE) MG tablet Take 325 mg by mouth daily with breakfast.        . glipiZIDE (GLUCOTROL XL) 10 MG 24 hr tablet Take 10 mg by mouth daily.        . Linagliptin-Metformin HCl (JENTADUETO) 2.5-500 MG TABS Take 1 tablet by mouth 2 (two) times daily.       Marland Kitchen lisinopril (PRINIVIL,ZESTRIL) 20 MG tablet Take 20 mg by mouth daily.       . Multiple Vitamins-Minerals (CENTRUM SILVER PO) Take 1 tablet by mouth daily.       . potassium chloride SA (K-DUR,KLOR-CON) 20 MEQ tablet Take 20 mEq by mouth daily.      . pravastatin (PRAVACHOL) 40 MG tablet Take 40 mg by mouth daily.        . sertraline (ZOLOFT) 50 MG tablet Take 50 mg by mouth daily.        Marland Kitchen triamterene-hydrochlorothiazide (MAXZIDE-25) 37.5-25 MG per tablet Take 1 tablet by mouth daily.       No current facility-administered medications for this visit.     Allergies:  Allergies  Allergen Reactions  . Sulfonamide Derivatives Hives and Itching   PMHx: 1. Intraductal carcinoma of the right breast diagnosed in July 1993,     treated with lumpectomy and  radiation treatments.  The patient     developed recurrent ductal carcinoma in situ from a biopsy carried     out on 08/03/2005 and underwent a right-sided mastectomy on     08/24/2005. 2. History of GIST involving the stomach     status post partial gastrectomy on 03/24/1995 without evidence of     recurrence. 3. History of osteoporosis. 4. History of depression and anxiety. 5. 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 6. Right middle ear and mastoid cholesteatoma status post surgery on     06/18/2011 at [facility]. 7. Diabetes mellitus type 2. 8. Hypertension. 9. Dyslipidemia. 10.Irritable bowel syndrome. 11.Family history of colon  cancer involving the patient's brother.  Surgical history, Social history, and Family History were reviewed and updated.  Review of Systems: Constitutional:  Negative for fever, chills, night sweats, anorexia, weight loss, pain. +Insomnia Cardiovascular: no chest pain or dyspnea on exertion Respiratory: no cough, shortness of breath, or wheezing Neurological: no TIA or stroke symptoms Dermatological: positive for seberottic  ENT: negative for - headaches or visual changes Skin: Negative. Gastrointestinal: no abdominal pain, change in bowel habits, or black or bloody stools Genito-Urinary: no dysuria, trouble voiding, or hematuria Hematological and Lymphatic: negative for - bleeding problems or blood clots Breast: negative for breast lumps Musculoskeletal: negative for - joint pain Remaining ROS negative.  Physical Exam: Blood pressure 160/77, pulse 78, temperature 98.5 F (36.9 C), temperature source Oral, resp. rate 18, height 5\' 7"  (1.702 m), weight 166 lb 6.4 oz (75.479 kg), SpO2 100.00%. ECOG: 1 General appearance: alert and no distress Head: Normocephalic, without obvious abnormality, atraumatic Neck: no adenopathy, supple, symmetrical, trachea midline and thyroid not enlarged, symmetric, no tenderness/mass/nodules Lymph nodes: Cervical, supraclavicular, and axillary nodes normal. Breast: Right breast is surgically absent via a right-sided mastectomy without evidence of chest wall recurrence.  Left breast is benign.  No axillary adenopathy. Heart:regular rate and rhythm, S1, S2 normal, no murmur, click, rub or gallop Lung:chest clear, no wheezing, rales, normal symmetric air entry, Heart exam - S1, S2 normal, no murmur, no gallop, rate regular Abdomin: soft, non-tender, without masses or organomegaly EXT:No edema Skin: A cafe au lait spot was seen in the left lateral chest region.  Lab Results:       Radiological Studies:04/06/2013 EXAM: LEFT DIGITAL SCREENING MAMMOGRAM  WITH CAD  COMPARISON: Previous exam(s) 03/28/2012 ACR Breast Density Category c: The breast tissue is heterogeneously dense, which may obscure small masses.  FINDINGS: The patient has had a right mastectomy. There are no findings suspicious for malignancy. Images were processed with CAD.  IMPRESSION: No mammographic evidence of malignancy. RECOMMENDATION: Screening mammogram in one year. (Code:SM-B-01Y) BI-RADS CATEGORY 1: Negative.    Impression and Plan:  1. At the present time, Mrs. Tulp appears to be doing well.  She is now 18-1/2 years from the time of her GIST of the stomach and 22 years from the time of her original diagnosis of DCIS involving the right breast, but 8 years from the time of her recurrence in July 2007.   2. Her breast exam today and her labs were reviewed and were normal. She does appear to have hyperglycemia based on her history of type 2 diabetes. She will check with PCP when her last DEXA scan was and if it has been more than 2 years, she will need another one. Prior history suggests bone loss problems but she  is off any bisphosphonates currently.  3. She will try OTC melatonin for her insomnia.  4. She is doing well.  We will plan to see her back in 1 year.  She will have CBC and chemistries.  She should have a mammogram in February of 2016 for her left breast.   Time spent with patient: 30 minutes including half the time counseling.  Bernadene Bell, MD Medical Hematologist/Oncologist [place] Pager: [phone] Office No: [phone]

## 2013-11-02 ENCOUNTER — Ambulatory Visit (INDEPENDENT_AMBULATORY_CARE_PROVIDER_SITE_OTHER): Payer: Medicare Other | Admitting: Gynecology

## 2013-11-02 ENCOUNTER — Encounter: Payer: Self-pay | Admitting: Gynecology

## 2013-11-02 VITALS — BP 130/84 | Ht 67.0 in | Wt 167.0 lb

## 2013-11-02 DIAGNOSIS — M899 Disorder of bone, unspecified: Secondary | ICD-10-CM | POA: Diagnosis not present

## 2013-11-02 DIAGNOSIS — N952 Postmenopausal atrophic vaginitis: Secondary | ICD-10-CM

## 2013-11-02 DIAGNOSIS — M949 Disorder of cartilage, unspecified: Secondary | ICD-10-CM | POA: Diagnosis not present

## 2013-11-02 DIAGNOSIS — C50911 Malignant neoplasm of unspecified site of right female breast: Secondary | ICD-10-CM

## 2013-11-02 DIAGNOSIS — M8580 Other specified disorders of bone density and structure, unspecified site: Secondary | ICD-10-CM

## 2013-11-02 DIAGNOSIS — C50919 Malignant neoplasm of unspecified site of unspecified female breast: Secondary | ICD-10-CM | POA: Diagnosis not present

## 2013-11-02 NOTE — Progress Notes (Signed)
Cindy Wong 1931/06/13 [id number]        80 y.o.  G1P0100 for follow up exam.  Past medical history,surgical history, problem list, medications, allergies, family history and social history were all reviewed and documented as reviewed in the EPIC chart.  ROS:  12 system ROS performed with pertinent positives and negatives included in the history, assessment and plan.   Additional significant findings :  none   Exam: Kim Counsellor Vitals:   11/02/13 1052  BP: 130/84  Height: 5\' 7"  (1.702 m)  Weight: 167 lb (75.751 kg)   General appearance:  Normal affect, orientation and appearance. Skin: Grossly normal HEENT: Without gross lesions.  No cervical or supraclavicular adenopathy. Thyroid normal.  Lungs:  Clear without wheezing, rales or rhonchi Cardiac: RR, without RMG Abdominal:  Soft, nontender, without masses, guarding, rebound, organomegaly or hernia Breasts:  Examined lying and sitting. Left without masses, retractions, discharge or axillary adenopathy.  Right chest wall without masses or adenopathy. Pelvic:  Ext/BUS/vagina with atrophic changes  Cervix flush with the upper vagina difficult to visualize  Adnexa  Without masses or tenderness    Anus and perineum  Normal   Rectovaginal  Normal sphincter tone without palpated masses or tenderness.    Assessment/Plan:  80 y.o. G67P0100 female for follow up exam.   1. Postmenopausal status post supracervical hysterectomy for leiomyoma. With atrophic genital changes. Without significant symptoms of hot flushes, night sweats or vaginal dryness. Is not sexually active. No vaginal bleeding. Continue to monitor and report any vaginal bleeding. 2. Osteopenia. Is followed for her primary physician's office. Had previously used Fosamax but is on drug-free holiday.  I have never seen copies of her reports but I did ask her to have them send me, her last DEXA reportedly 2013. Increased calcium vitamin D recommendations  reviewed. 3. Breast cancer status post right mastectomy. Doing well with exam NED. Mammogram 03/2013.  Continue with annual mammography and SBE monthly. 4. Pap smear 2010. No Pap smear done today. No history of significant abnormal Pap smears. We have previously reviewed current screening guidelines and both agree to stop screening issues over the age of 5. 34. Colonoscopy 2011. Repeat at their recommended interval. 6. Health maintenance. No blood work done as this is done at Dr. Loren Racer office. Follow up one year, sooner as needed   Note: This document was prepared with digital dictation and possible smart phrase technology. Any transcriptional errors that result from this process are unintentional.   Anastasio Auerbach MD, 11:26 AM 11/02/2013

## 2013-11-02 NOTE — Patient Instructions (Signed)
Follow up in one year, sooner if any issues.  You may obtain a copy of any labs that were done today by logging onto MyChart as outlined in the instructions provided with your AVS (after visit summary). The office will not call with normal lab results but certainly if there are any significant abnormalities then we will contact you.   Health Maintenance, Female A healthy lifestyle and preventative care can promote health and wellness.  Maintain regular health, dental, and eye exams.  Eat a healthy diet. Foods like vegetables, fruits, whole grains, low-fat dairy products, and lean protein foods contain the nutrients you need without too many calories. Decrease your intake of foods high in solid fats, added sugars, and salt. Get information about a proper diet from your caregiver, if necessary.  Regular physical exercise is one of the most important things you can do for your health. Most adults should get at least 150 minutes of moderate-intensity exercise (any activity that increases your heart rate and causes you to sweat) each week. In addition, most adults need muscle-strengthening exercises on 2 or more days a week.   Maintain a healthy weight. The body mass index (BMI) is a screening tool to identify possible weight problems. It provides an estimate of body fat based on height and weight. Your caregiver can help determine your BMI, and can help you achieve or maintain a healthy weight. For adults 20 years and older:  A BMI below 18.5 is considered underweight.  A BMI of 18.5 to 24.9 is normal.  A BMI of 25 to 29.9 is considered overweight.  A BMI of 30 and above is considered obese.  Maintain normal blood lipids and cholesterol by exercising and minimizing your intake of saturated fat. Eat a balanced diet with plenty of fruits and vegetables. Blood tests for lipids and cholesterol should begin at age 20 and be repeated every 5 years. If your lipid or cholesterol levels are high, you are  over 50, or you are a high risk for heart disease, you may need your cholesterol levels checked more frequently.Ongoing high lipid and cholesterol levels should be treated with medicines if diet and exercise are not effective.  If you smoke, find out from your caregiver how to quit. If you do not use tobacco, do not start.  Lung cancer screening is recommended for adults aged 55 80 years who are at high risk for developing lung cancer because of a history of smoking. Yearly low-dose computed tomography (CT) is recommended for people who have at least a 30-pack-year history of smoking and are a current smoker or have quit within the past 15 years. A pack year of smoking is smoking an average of 1 pack of cigarettes a day for 1 year (for example: 1 pack a day for 30 years or 2 packs a day for 15 years). Yearly screening should continue until the smoker has stopped smoking for at least 15 years. Yearly screening should also be stopped for people who develop a health problem that would prevent them from having lung cancer treatment.  If you are pregnant, do not drink alcohol. If you are breastfeeding, be very cautious about drinking alcohol. If you are not pregnant and choose to drink alcohol, do not exceed 1 drink per day. One drink is considered to be 12 ounces (355 mL) of beer, 5 ounces (148 mL) of wine, or 1.5 ounces (44 mL) of liquor.  Avoid use of street drugs. Do not share needles with anyone. Ask   for help if you need support or instructions about stopping the use of drugs.  High blood pressure causes heart disease and increases the risk of stroke. Blood pressure should be checked at least every 1 to 2 years. Ongoing high blood pressure should be treated with medicines, if weight loss and exercise are not effective.  If you are 55 to 79 years old, ask your caregiver if you should take aspirin to prevent strokes.  Diabetes screening involves taking a blood sample to check your fasting blood sugar  level. This should be done once every 3 years, after age 45, if you are within normal weight and without risk factors for diabetes. Testing should be considered at a younger age or be carried out more frequently if you are overweight and have at least 1 risk factor for diabetes.  Breast cancer screening is essential preventative care for women. You should practice "breast self-awareness." This means understanding the normal appearance and feel of your breasts and may include breast self-examination. Any changes detected, no matter how small, should be reported to a caregiver. Women in their 20s and 30s should have a clinical breast exam (CBE) by a caregiver as part of a regular health exam every 1 to 3 years. After age 40, women should have a CBE every year. Starting at age 40, women should consider having a mammogram (breast X-ray) every year. Women who have a family history of breast cancer should talk to their caregiver about genetic screening. Women at a high risk of breast cancer should talk to their caregiver about having an MRI and a mammogram every year.  Breast cancer gene (BRCA)-related cancer risk assessment is recommended for women who have family members with BRCA-related cancers. BRCA-related cancers include breast, ovarian, tubal, and peritoneal cancers. Having family members with these cancers may be associated with an increased risk for harmful changes (mutations) in the breast cancer genes BRCA1 and BRCA2. Results of the assessment will determine the need for genetic counseling and BRCA1 and BRCA2 testing.  The Pap test is a screening test for cervical cancer. Women should have a Pap test starting at age 21. Between ages 21 and 29, Pap tests should be repeated every 2 years. Beginning at age 30, you should have a Pap test every 3 years as long as the past 3 Pap tests have been normal. If you had a hysterectomy for a problem that was not cancer or a condition that could lead to cancer, then  you no longer need Pap tests. If you are between ages 65 and 70, and you have had normal Pap tests going back 10 years, you no longer need Pap tests. If you have had past treatment for cervical cancer or a condition that could lead to cancer, you need Pap tests and screening for cancer for at least 20 years after your treatment. If Pap tests have been discontinued, risk factors (such as a new sexual partner) need to be reassessed to determine if screening should be resumed. Some women have medical problems that increase the chance of getting cervical cancer. In these cases, your caregiver may recommend more frequent screening and Pap tests.  The human papillomavirus (HPV) test is an additional test that may be used for cervical cancer screening. The HPV test looks for the virus that can cause the cell changes on the cervix. The cells collected during the Pap test can be tested for HPV. The HPV test could be used to screen women aged 30 years and   older, and should be used in women of any age who have unclear Pap test results. After the age of 46, women should have HPV testing at the same frequency as a Pap test.  Colorectal cancer can be detected and often prevented. Most routine colorectal cancer screening begins at the age of 34 and continues through age 35. However, your caregiver may recommend screening at an earlier age if you have risk factors for colon cancer. On a yearly basis, your caregiver may provide home test kits to check for hidden blood in the stool. Use of a small camera at the end of a tube, to directly examine the colon (sigmoidoscopy or colonoscopy), can detect the earliest forms of colorectal cancer. Talk to your caregiver about this at age 68, when routine screening begins. Direct examination of the colon should be repeated every 5 to 10 years through age 15, unless early forms of pre-cancerous polyps or small growths are found.  Hepatitis C blood testing is recommended for all people born  from [birth year] through 1965 and any individual with known risks for hepatitis C.  Practice safe sex. Use condoms and avoid high-risk sexual practices to reduce the spread of sexually transmitted infections (STIs). Sexually active women aged 102 and younger should be checked for Chlamydia, which is a common sexually transmitted infection. Older women with new or multiple partners should also be tested for Chlamydia. Testing for other STIs is recommended if you are sexually active and at increased risk.  Osteoporosis is a disease in which the bones lose minerals and strength with aging. This can result in serious bone fractures. The risk of osteoporosis can be identified using a bone density scan. Women ages 82 and over and women at risk for fractures or osteoporosis should discuss screening with their caregivers. Ask your caregiver whether you should be taking a calcium supplement or vitamin D to reduce the rate of osteoporosis.  Menopause can be associated with physical symptoms and risks. Hormone replacement therapy is available to decrease symptoms and risks. You should talk to your caregiver about whether hormone replacement therapy is right for you.  Use sunscreen. Apply sunscreen liberally and repeatedly throughout the day. You should seek shade when your shadow is shorter than you. Protect yourself by wearing long sleeves, pants, a wide-brimmed hat, and sunglasses year round, whenever you are outdoors.  Notify your caregiver of new moles or changes in moles, especially if there is a change in shape or color. Also notify your caregiver if a mole is larger than the size of a pencil eraser.  Stay current with your immunizations. Document Released: 08/21/2010 Document Revised: 06/02/2012 Document Reviewed: 08/21/2010 [facility] Patient Information 2014 [place].

## 2013-11-09 DIAGNOSIS — R300 Dysuria: Secondary | ICD-10-CM | POA: Diagnosis not present

## 2013-11-09 DIAGNOSIS — R82998 Other abnormal findings in urine: Secondary | ICD-10-CM | POA: Diagnosis not present

## 2013-11-18 DIAGNOSIS — H25019 Cortical age-related cataract, unspecified eye: Secondary | ICD-10-CM | POA: Diagnosis not present

## 2013-11-18 DIAGNOSIS — H2510 Age-related nuclear cataract, unspecified eye: Secondary | ICD-10-CM | POA: Diagnosis not present

## 2013-12-21 ENCOUNTER — Encounter: Payer: Self-pay | Admitting: Gynecology

## 2014-01-18 ENCOUNTER — Ambulatory Visit (INDEPENDENT_AMBULATORY_CARE_PROVIDER_SITE_OTHER): Payer: Medicare Other | Admitting: Podiatry

## 2014-01-18 DIAGNOSIS — E1151 Type 2 diabetes mellitus with diabetic peripheral angiopathy without gangrene: Secondary | ICD-10-CM | POA: Diagnosis not present

## 2014-01-18 DIAGNOSIS — Q828 Other specified congenital malformations of skin: Secondary | ICD-10-CM | POA: Diagnosis not present

## 2014-01-18 DIAGNOSIS — M79673 Pain in unspecified foot: Secondary | ICD-10-CM

## 2014-01-18 DIAGNOSIS — B351 Tinea unguium: Secondary | ICD-10-CM

## 2014-01-18 NOTE — Progress Notes (Signed)
Subjective:     Patient ID: Cindy Wong, female   DOB: [birth year], 80 y.o.   MRN: [id number]  HPI patient presents with thick E. long gaited nailbeds 1-5 of both feet that are painful and unable to the patient to cut. Patient is also noted to have lesions underneath the fifth metatarsals of both feet which are painful when palpated   Review of Systems     Objective:   Physical Exam Neurovascular status unchanged patient well oriented x3 with thick yellow brittle nailbeds 1-5 both feet keratotic lesions fifth metatarsal both feet with small lucent course and a long-term diabetic    Assessment:     Mycotic nail infection with pain 1-5 both feet. Porokeratotic type lesions secondary to pressure with at risk diabetic condition    Plan:     Debris painful nailbeds 1-5 both feet with no iatrogenic bleeding noted. Debrided lesions on both feet

## 2014-02-01 DIAGNOSIS — M8580 Other specified disorders of bone density and structure, unspecified site: Secondary | ICD-10-CM | POA: Diagnosis not present

## 2014-02-01 DIAGNOSIS — K222 Esophageal obstruction: Secondary | ICD-10-CM | POA: Diagnosis not present

## 2014-02-01 DIAGNOSIS — F418 Other specified anxiety disorders: Secondary | ICD-10-CM | POA: Diagnosis not present

## 2014-02-01 DIAGNOSIS — E119 Type 2 diabetes mellitus without complications: Secondary | ICD-10-CM | POA: Diagnosis not present

## 2014-02-01 DIAGNOSIS — C50919 Malignant neoplasm of unspecified site of unspecified female breast: Secondary | ICD-10-CM | POA: Diagnosis not present

## 2014-02-01 DIAGNOSIS — I10 Essential (primary) hypertension: Secondary | ICD-10-CM | POA: Diagnosis not present

## 2014-02-01 DIAGNOSIS — E559 Vitamin D deficiency, unspecified: Secondary | ICD-10-CM | POA: Diagnosis not present

## 2014-02-01 DIAGNOSIS — E785 Hyperlipidemia, unspecified: Secondary | ICD-10-CM | POA: Diagnosis not present

## 2014-03-23 ENCOUNTER — Other Ambulatory Visit: Payer: Self-pay | Admitting: Internal Medicine

## 2014-03-23 DIAGNOSIS — Z853 Personal history of malignant neoplasm of breast: Secondary | ICD-10-CM

## 2014-03-23 DIAGNOSIS — Z9011 Acquired absence of right breast and nipple: Secondary | ICD-10-CM

## 2014-03-23 DIAGNOSIS — Z1231 Encounter for screening mammogram for malignant neoplasm of breast: Secondary | ICD-10-CM

## 2014-04-09 ENCOUNTER — Ambulatory Visit
Admission: RE | Admit: 2014-04-09 | Discharge: 2014-04-09 | Disposition: A | Discharge status: Home / Self Care | Payer: Medicare Other | Source: Ambulatory Visit | Attending: Internal Medicine | Admitting: Internal Medicine

## 2014-04-09 DIAGNOSIS — Z1231 Encounter for screening mammogram for malignant neoplasm of breast: Secondary | ICD-10-CM | POA: Diagnosis not present

## 2014-04-09 DIAGNOSIS — Z9011 Acquired absence of right breast and nipple: Secondary | ICD-10-CM

## 2014-04-09 DIAGNOSIS — Z853 Personal history of malignant neoplasm of breast: Secondary | ICD-10-CM

## 2014-04-19 ENCOUNTER — Ambulatory Visit (INDEPENDENT_AMBULATORY_CARE_PROVIDER_SITE_OTHER): Payer: Medicare Other | Admitting: Podiatry

## 2014-04-19 DIAGNOSIS — Q828 Other specified congenital malformations of skin: Secondary | ICD-10-CM | POA: Diagnosis not present

## 2014-04-19 DIAGNOSIS — M79673 Pain in unspecified foot: Secondary | ICD-10-CM | POA: Diagnosis not present

## 2014-04-19 DIAGNOSIS — B351 Tinea unguium: Secondary | ICD-10-CM

## 2014-04-19 DIAGNOSIS — E1151 Type 2 diabetes mellitus with diabetic peripheral angiopathy without gangrene: Secondary | ICD-10-CM | POA: Diagnosis not present

## 2014-04-19 NOTE — Progress Notes (Signed)
Subjective:     Patient ID: Cindy Wong, female   DOB: May 20, 1931, 81 y.o.   MRN: [id number]  HPI patient presents with thick E. long gaited nailbeds 1-5 of both feet that are painful and unable to the patient to cut. Patient is also noted to have lesions underneath the fifth metatarsals of both feet which are painful when palpated   Review of Systems     Objective:   Physical Exam Neurovascular status unchanged patient well oriented x3 with thick yellow brittle nailbeds 1-5 both feet keratotic lesions fifth metatarsal both feet with small lucent course and a long-term diabetic    Assessment:     Mycotic nail infection with pain 1-5 both feet. Porokeratotic type lesions secondary to pressure with at risk diabetic condition    Plan:     Debris painful nailbeds 1-5 both feet with no iatrogenic bleeding noted. Debrided lesions on both feet

## 2014-04-28 ENCOUNTER — Other Ambulatory Visit: Payer: Self-pay | Admitting: Dermatology

## 2014-04-28 DIAGNOSIS — D485 Neoplasm of uncertain behavior of skin: Secondary | ICD-10-CM | POA: Diagnosis not present

## 2014-04-28 DIAGNOSIS — L814 Other melanin hyperpigmentation: Secondary | ICD-10-CM | POA: Diagnosis not present

## 2014-04-28 DIAGNOSIS — D1801 Hemangioma of skin and subcutaneous tissue: Secondary | ICD-10-CM | POA: Diagnosis not present

## 2014-04-28 DIAGNOSIS — L821 Other seborrheic keratosis: Secondary | ICD-10-CM | POA: Diagnosis not present

## 2014-04-28 DIAGNOSIS — D225 Melanocytic nevi of trunk: Secondary | ICD-10-CM | POA: Diagnosis not present

## 2014-07-12 ENCOUNTER — Encounter: Payer: Self-pay | Admitting: Gastroenterology

## 2014-08-04 DIAGNOSIS — E785 Hyperlipidemia, unspecified: Secondary | ICD-10-CM | POA: Diagnosis not present

## 2014-08-04 DIAGNOSIS — E119 Type 2 diabetes mellitus without complications: Secondary | ICD-10-CM | POA: Diagnosis not present

## 2014-08-04 DIAGNOSIS — M859 Disorder of bone density and structure, unspecified: Secondary | ICD-10-CM | POA: Diagnosis not present

## 2014-08-09 ENCOUNTER — Ambulatory Visit: Payer: Medicare Other

## 2014-08-11 DIAGNOSIS — Z0000 Encounter for general adult medical examination without abnormal findings: Secondary | ICD-10-CM | POA: Diagnosis not present

## 2014-08-11 DIAGNOSIS — Z6824 Body mass index (BMI) 24.0-24.9, adult: Secondary | ICD-10-CM | POA: Diagnosis not present

## 2014-08-11 DIAGNOSIS — E119 Type 2 diabetes mellitus without complications: Secondary | ICD-10-CM | POA: Diagnosis not present

## 2014-08-11 DIAGNOSIS — E785 Hyperlipidemia, unspecified: Secondary | ICD-10-CM | POA: Diagnosis not present

## 2014-08-11 DIAGNOSIS — I10 Essential (primary) hypertension: Secondary | ICD-10-CM | POA: Diagnosis not present

## 2014-08-11 DIAGNOSIS — Z9010 Acquired absence of unspecified breast and nipple: Secondary | ICD-10-CM | POA: Diagnosis not present

## 2014-08-11 DIAGNOSIS — Z1389 Encounter for screening for other disorder: Secondary | ICD-10-CM | POA: Diagnosis not present

## 2014-08-11 DIAGNOSIS — K222 Esophageal obstruction: Secondary | ICD-10-CM | POA: Diagnosis not present

## 2014-08-11 DIAGNOSIS — C50919 Malignant neoplasm of unspecified site of unspecified female breast: Secondary | ICD-10-CM | POA: Diagnosis not present

## 2014-08-11 DIAGNOSIS — E559 Vitamin D deficiency, unspecified: Secondary | ICD-10-CM | POA: Diagnosis not present

## 2014-08-11 DIAGNOSIS — M8580 Other specified disorders of bone density and structure, unspecified site: Secondary | ICD-10-CM | POA: Diagnosis not present

## 2014-08-11 DIAGNOSIS — F418 Other specified anxiety disorders: Secondary | ICD-10-CM | POA: Diagnosis not present

## 2014-08-12 ENCOUNTER — Ambulatory Visit (INDEPENDENT_AMBULATORY_CARE_PROVIDER_SITE_OTHER): Payer: Medicare Other | Admitting: Podiatry

## 2014-08-12 ENCOUNTER — Encounter: Payer: Self-pay | Admitting: Podiatry

## 2014-08-12 DIAGNOSIS — Q828 Other specified congenital malformations of skin: Secondary | ICD-10-CM

## 2014-08-12 DIAGNOSIS — B351 Tinea unguium: Secondary | ICD-10-CM | POA: Diagnosis not present

## 2014-08-12 DIAGNOSIS — E1151 Type 2 diabetes mellitus with diabetic peripheral angiopathy without gangrene: Secondary | ICD-10-CM | POA: Diagnosis not present

## 2014-08-12 DIAGNOSIS — M79673 Pain in unspecified foot: Secondary | ICD-10-CM | POA: Diagnosis not present

## 2014-08-12 DIAGNOSIS — M79606 Pain in leg, unspecified: Secondary | ICD-10-CM | POA: Diagnosis not present

## 2014-08-14 NOTE — Progress Notes (Signed)
Subjective:     Patient ID: Cindy Wong, female   DOB: [birth year], 81 y.o.   MRN: [id number]  HPI patient presents with yellow thick nailbeds 1-5 both feet that are painful when pressed and make wearing shoe gear difficult   Review of Systems     Objective:   Physical Exam Neurovascular status unchanged with thick yellow brittle nails 1-5 both feet that are painful when palpated dorsally    Assessment:     Chronic mycotic nail infection with pain bilateral    Plan:     Debride painful nailbeds 1-5 both feet with no iatrogenic bleeding noted

## 2014-09-01 DIAGNOSIS — Z1212 Encounter for screening for malignant neoplasm of rectum: Secondary | ICD-10-CM | POA: Diagnosis not present

## 2014-10-06 DIAGNOSIS — R300 Dysuria: Secondary | ICD-10-CM | POA: Diagnosis not present

## 2014-10-06 DIAGNOSIS — R5381 Other malaise: Secondary | ICD-10-CM | POA: Diagnosis not present

## 2014-10-06 DIAGNOSIS — E559 Vitamin D deficiency, unspecified: Secondary | ICD-10-CM | POA: Diagnosis not present

## 2014-10-06 DIAGNOSIS — R718 Other abnormality of red blood cells: Secondary | ICD-10-CM | POA: Diagnosis not present

## 2014-10-06 DIAGNOSIS — Z6824 Body mass index (BMI) 24.0-24.9, adult: Secondary | ICD-10-CM | POA: Diagnosis not present

## 2014-10-06 DIAGNOSIS — I10 Essential (primary) hypertension: Secondary | ICD-10-CM | POA: Diagnosis not present

## 2014-10-06 DIAGNOSIS — F418 Other specified anxiety disorders: Secondary | ICD-10-CM | POA: Diagnosis not present

## 2014-10-06 DIAGNOSIS — C50919 Malignant neoplasm of unspecified site of unspecified female breast: Secondary | ICD-10-CM | POA: Diagnosis not present

## 2014-10-12 ENCOUNTER — Telehealth: Payer: Self-pay | Admitting: Hematology

## 2014-10-12 NOTE — Telephone Encounter (Signed)
Moved 9/6 appointment from [facility] to [place]. Spoke with patient she is aware.

## 2014-10-26 ENCOUNTER — Encounter: Payer: Self-pay | Admitting: Hematology

## 2014-10-26 ENCOUNTER — Telehealth: Payer: Self-pay | Admitting: Hematology

## 2014-10-26 ENCOUNTER — Other Ambulatory Visit (HOSPITAL_BASED_OUTPATIENT_CLINIC_OR_DEPARTMENT_OTHER): Payer: Medicare Other

## 2014-10-26 ENCOUNTER — Ambulatory Visit (HOSPITAL_BASED_OUTPATIENT_CLINIC_OR_DEPARTMENT_OTHER): Payer: Medicare Other | Admitting: Hematology

## 2014-10-26 VITALS — BP 160/87 | HR 72 | Temp 97.9°F | Resp 18 | Ht 67.0 in | Wt 164.7 lb

## 2014-10-26 DIAGNOSIS — D0511 Intraductal carcinoma in situ of right breast: Secondary | ICD-10-CM

## 2014-10-26 DIAGNOSIS — I10 Essential (primary) hypertension: Secondary | ICD-10-CM

## 2014-10-26 DIAGNOSIS — Z853 Personal history of malignant neoplasm of breast: Secondary | ICD-10-CM

## 2014-10-26 DIAGNOSIS — Z85028 Personal history of other malignant neoplasm of stomach: Secondary | ICD-10-CM | POA: Diagnosis not present

## 2014-10-26 LAB — CBC WITH DIFFERENTIAL/PLATELET
BASO%: 0.3 % (ref 0.0–2.0)
Basophils Absolute: 0 10*3/uL (ref 0.0–0.1)
EOS%: 2.2 % (ref 0.0–7.0)
Eosinophils Absolute: 0.2 10*3/uL (ref 0.0–0.5)
HCT: 45.3 % (ref 34.8–46.6)
HGB: 14.4 g/dL (ref 11.6–15.9)
LYMPH%: 28.3 % (ref 14.0–49.7)
MCH: 26.4 pg (ref 25.1–34.0)
MCHC: 31.9 g/dL (ref 31.5–36.0)
MCV: 82.8 fL (ref 79.5–101.0)
MONO#: 0.5 10*3/uL (ref 0.1–0.9)
MONO%: 6.2 % (ref 0.0–14.0)
NEUT#: 5.2 10*3/uL (ref 1.5–6.5)
NEUT%: 63 % (ref 38.4–76.8)
Platelets: 297 10*3/uL (ref 145–400)
RBC: 5.46 10*6/uL — ABNORMAL HIGH (ref 3.70–5.45)
RDW: 13.7 % (ref 11.2–14.5)
WBC: 8.3 10*3/uL (ref 3.9–10.3)
lymph#: 2.4 10*3/uL (ref 0.9–3.3)

## 2014-10-26 LAB — COMPREHENSIVE METABOLIC PANEL (CC13)
ALT: 17 U/L (ref 0–55)
AST: 19 U/L (ref 5–34)
Albumin: 3.7 g/dL (ref 3.5–5.0)
Alkaline Phosphatase: 55 U/L (ref 40–150)
Anion Gap: 7 mEq/L (ref 3–11)
BUN: 14.3 mg/dL (ref 7.0–26.0)
CO2: 24 mEq/L (ref 22–29)
Calcium: 9.4 mg/dL (ref 8.4–10.4)
Chloride: 109 mEq/L (ref 98–109)
Creatinine: 0.8 mg/dL (ref 0.6–1.1)
EGFR: 76 mL/min/{1.73_m2} — ABNORMAL LOW (ref >90)
Glucose: 164 mg/dl — ABNORMAL HIGH (ref 70–140)
Potassium: 4.2 mEq/L (ref 3.5–5.1)
Sodium: 140 mEq/L (ref 136–145)
Total Bilirubin: 0.36 mg/dL (ref 0.20–1.20)
Total Protein: 6.7 g/dL (ref 6.4–8.3)

## 2014-10-26 NOTE — Telephone Encounter (Signed)
Appointments made and avs printed for patient,no mammo order has been placed yet and the breast center will call the patient when placed

## 2014-10-26 NOTE — Progress Notes (Signed)
Hematology and Oncology Follow Up Visit  Cindy Wong [id number] [birth year] 81 y.o. 10/26/2014 8:45 AM Cindy Pao, MD  Principle Diagnosises: Ductal carcinoma in situ  (DCIS) of the right breast and a history of gastrointestinal stromal tumor (GIST)  Prior Therapy:  . DCIS right breast (1993) treated with lumpectomy and radiation treatments.  She  developed recurrent DCIS in 08/03/2005 and  underwent a right-sided mastectomy on 08/24/2005.  GIST involving the stomach s/p partial gastrectomy on 03/24/1995 without evidence of recurrence.  Current therapy: Surveillance  Interim History:  Cindy Wong returns for annual follow-up. She was last seen by Dr. Lona Kettle one year ago. This is my first encounter with her. I reviewed her previous medical records extensively.   She is doing very well clinically. She has good energy level and appetite. Also she is 39, she remains fairly active at home. She denies any significant pain, nausea, bloating, dyspnea, or other symptoms. She denies any hospitalization or eating visits in the past year.  Medications: I have reviewed the patient's current medications.  Current Outpatient Prescriptions  Medication Sig Dispense Refill  . amLODipine (NORVASC) 5 MG tablet Take 5 mg by mouth daily.    . diazepam (VALIUM) 5 MG tablet Take 2.5-5 mg by mouth 2 (two) times daily.     . ferrous sulfate 325 (65 FE) MG tablet Take 325 mg by mouth daily with breakfast.      . glipiZIDE (GLUCOTROL XL) 10 MG 24 hr tablet Take 10 mg by mouth daily.      . Linagliptin-Metformin HCl (JENTADUETO) 2.5-500 MG TABS Take 1 tablet by mouth 2 (two) times daily.     Marland Kitchen lisinopril (PRINIVIL,ZESTRIL) 20 MG tablet Take 20 mg by mouth daily.     . Multiple Vitamins-Minerals (CENTRUM SILVER PO) Take 1 tablet by mouth daily.     . potassium chloride SA (K-DUR,KLOR-CON) 20 MEQ tablet Take 20 mEq by mouth daily.    . pravastatin (PRAVACHOL) 40 MG tablet Take 40 mg by mouth daily.      .  sertraline (ZOLOFT) 50 MG tablet Take 50 mg by mouth daily.      Marland Kitchen triamterene-hydrochlorothiazide (MAXZIDE-25) 37.5-25 MG per tablet Take 1 tablet by mouth daily.     No current facility-administered medications for this visit.     Allergies:  Allergies  Allergen Reactions  . Sulfonamide Derivatives Hives and Itching   PMHx: 1. Intraductal carcinoma of the right breast diagnosed in July 1993,     treated with lumpectomy and radiation treatments.  The patient     developed recurrent ductal carcinoma in situ from a biopsy carried     out on 08/03/2005 and underwent a right-sided mastectomy on     08/24/2005. 2. History of GIST involving the stomach     status post partial gastrectomy on 03/24/1995 without evidence of     recurrence. 3. History of osteoporosis. 4. History of depression and anxiety. 5. 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 6. Right middle ear and mastoid cholesteatoma status post surgery on     06/18/2011 at [facility]. 7. Diabetes mellitus type 2. 8. Hypertension. 9. Dyslipidemia. 10.Irritable bowel syndrome. 11.Family history of colon cancer involving the patient's brother.  Surgical history, Social history, and Family History were reviewed and updated.  Review of Systems: Constitutional:  Negative for fever, chills, night sweats, anorexia, weight loss, pain. +Insomnia Cardiovascular: no chest pain or  dyspnea on exertion Respiratory: no cough, shortness of breath, or wheezing Neurological: no TIA or stroke symptoms Dermatological: positive for seberottic  ENT: negative for - headaches or visual changes Skin: Negative. Gastrointestinal: no abdominal pain, change in bowel habits, or black or bloody stools Genito-Urinary: no dysuria, trouble voiding, or hematuria Hematological and Lymphatic: negative for - bleeding problems or blood clots Breast:  negative for breast lumps Musculoskeletal: negative for - joint pain Remaining ROS negative.  Physical Exam: There were no vitals taken for this visit. ECOG: 1 General appearance: alert and no distress Head: Normocephalic, without obvious abnormality, atraumatic Neck: no adenopathy, supple, symmetrical, trachea midline and thyroid not enlarged, symmetric, no tenderness/mass/nodules Lymph nodes: Cervical, supraclavicular, and axillary nodes normal. Breast: Right breast is surgically absent via a right-sided mastectomy without evidence of chest wall recurrence.  Left breast is benign.  No axillary adenopathy. Heart:regular rate and rhythm, S1, S2 normal, no murmur, click, rub or gallop Lung:chest clear, no wheezing, rales, normal symmetric air entry, Heart exam - S1, S2 normal, no murmur, no gallop, rate regular Abdomin: soft, non-tender, without masses or organomegaly EXT:No edema Skin: A cafe au lait spot was seen in the left lateral chest region.  Lab Results: CBC Latest Ref Rng 10/26/2014 10/23/2013 05/30/2013  WBC 3.9 - 10.3 10e3/uL 8.3 8.3 9.6  Hemoglobin 11.6 - 15.9 g/dL 14.4 14.0 14.6  Hematocrit 34.8 - 46.6 % 45.3 44.4 46.7(H)  Platelets 145 - 400 10e3/uL 297 314 293    CMP Latest Ref Rng 10/26/2014 10/23/2013 01/26/2013  Glucose 70 - 140 mg/dl 164(H) 165(H) 192(H)  BUN 7.0 - 26.0 mg/dL 14.3 9.9 14  Creatinine 0.6 - 1.1 mg/dL 0.8 0.8 1.0  Sodium 136 - 145 mEq/L 140 142 138  Potassium 3.5 - 5.1 mEq/L 4.2 3.9 4.0  Chloride 96 - 112 mEq/L - - 105  CO2 22 - 29 mEq/L 24 25 26   Calcium 8.4 - 10.4 mg/dL 9.4 9.5 9.7  Total Protein 6.4 - 8.3 g/dL 6.7 6.8 -  Total Bilirubin 0.20 - 1.20 mg/dL 0.36 0.30 -  Alkaline Phos 40 - 150 U/L 55 53 -  AST 5 - 34 U/L 19 17 -  ALT 0 - 55 U/L 17 15 -     Radiological Studies:  LEFT DIGITAL SCREENING MAMMOGRAM WITH CAD 04/09/2014 IMPRESSION: No mammographic evidence of malignancy. A result letter of this screening mammogram will be mailed directly  to the patient.  RECOMMENDATION: Screening mammogram in one year. (Code:SM-B-01Y)  Impression and Plan: 81 year old African-American female  1. History of recurrent right breast DCIS -DCIS right breast (1993) treated with lumpectomy and radiation treatments.  She  developed recurrent DCIS in 08/03/2005 and  underwent a right-sided mastectomy on 08/24/2005.   -She is clinically doing very well, annual screening mammogram of left breast has been negative, physical exam was unremarkable, today's lab reviewed, all within normal limits, no evidence of recurrence. -Although she is 32, she is fairly in good health, we'll continue annual screening left breast mammogram, next due in [place] 2017 -I encouraged her to continue healthy diet and regular exercise. She is physically active  2. History of gastric GIST  - s/p partial gastrectomy on 03/24/1995  -She is clinically doing well, no evidence of recurrence -No need for routine scan surveillance, or follow up clinically.  Follow up -Next screening mammogram in family 2017 -I'll see her back in one year  I spent 30 minutes with her visit today more than 50% time on face-to-face counseling.  Truitt Merle 10/26/2014

## 2014-11-03 DIAGNOSIS — Z23 Encounter for immunization: Secondary | ICD-10-CM | POA: Diagnosis not present

## 2014-11-23 DIAGNOSIS — H2513 Age-related nuclear cataract, bilateral: Secondary | ICD-10-CM | POA: Diagnosis not present

## 2014-11-23 DIAGNOSIS — H25013 Cortical age-related cataract, bilateral: Secondary | ICD-10-CM | POA: Diagnosis not present

## 2014-11-23 DIAGNOSIS — E109 Type 1 diabetes mellitus without complications: Secondary | ICD-10-CM | POA: Diagnosis not present

## 2014-11-25 ENCOUNTER — Ambulatory Visit: Payer: Medicare Other | Admitting: Podiatry

## 2014-12-02 ENCOUNTER — Encounter: Payer: Self-pay | Admitting: Podiatry

## 2014-12-02 ENCOUNTER — Ambulatory Visit (INDEPENDENT_AMBULATORY_CARE_PROVIDER_SITE_OTHER): Payer: Medicare Other | Admitting: Podiatry

## 2014-12-02 DIAGNOSIS — M79673 Pain in unspecified foot: Secondary | ICD-10-CM | POA: Diagnosis not present

## 2014-12-02 DIAGNOSIS — B351 Tinea unguium: Secondary | ICD-10-CM

## 2014-12-02 DIAGNOSIS — L84 Corns and callosities: Secondary | ICD-10-CM | POA: Diagnosis not present

## 2014-12-02 NOTE — Progress Notes (Signed)
Patient ID: Cindy Wong, female   DOB: [birth year], 81 y.o.   MRN: [id number] Complaint:  Visit Type: Patient returns to my office for continued preventative foot care services. Complaint: Patient states" my nails have grown long and thick and become painful to walk and wear shoes" Patient has been diagnosed with DM with no foot complications. The patient presents for preventative foot care services. No changes to ROS  Podiatric Exam: Vascular: dorsalis pedis and posterior tibial pulses are palpable bilateral. Capillary return is immediate. Temperature gradient is WNL. Skin turgor WNL  Sensorium: Normal Semmes Weinstein monofilament test. Normal tactile sensation bilaterally. Nail Exam: Pt has thick disfigured discolored nails with subungual debris noted bilateral entire nail hallux through fifth toenails Ulcer Exam: There is no evidence of ulcer or pre-ulcerative changes or infection. Orthopedic Exam: Muscle tone and strength are WNL. No limitations in general ROM. No crepitus or effusions noted. Foot type and digits show no abnormalities. Bony prominences are unremarkable. Skin: No Porokeratosis. No infection or ulcers.  Heloma Durum fifth toe left foot.  Diagnosis:  Onychomycosis, , Pain in right toe, pain in left toes,  Corn 5th toe left foot.  Treatment & Plan Procedures and Treatment: Consent by patient was obtained for treatment procedures. The patient understood the discussion of treatment and procedures well. All questions were answered thoroughly reviewed. Debridement of mycotic and hypertrophic toenails, 1 through 5 bilateral and clearing of subungual debris. No ulceration, no infection noted. Debride heloma durum Return Visit-Office Procedure: Patient instructed to return to the office for a follow up visit 3 months for continued evaluation and treatment.

## 2014-12-03 ENCOUNTER — Ambulatory Visit: Payer: Medicare Other | Admitting: Podiatry

## 2014-12-10 ENCOUNTER — Ambulatory Visit (INDEPENDENT_AMBULATORY_CARE_PROVIDER_SITE_OTHER): Payer: Medicare Other | Admitting: Gynecology

## 2014-12-10 ENCOUNTER — Encounter: Payer: Self-pay | Admitting: Gynecology

## 2014-12-10 VITALS — BP 124/80 | Ht 68.0 in | Wt 164.0 lb

## 2014-12-10 DIAGNOSIS — C50111 Malignant neoplasm of central portion of right female breast: Secondary | ICD-10-CM

## 2014-12-10 DIAGNOSIS — N952 Postmenopausal atrophic vaginitis: Secondary | ICD-10-CM

## 2014-12-10 DIAGNOSIS — Z01419 Encounter for gynecological examination (general) (routine) without abnormal findings: Secondary | ICD-10-CM

## 2014-12-10 DIAGNOSIS — M8580 Other specified disorders of bone density and structure, unspecified site: Secondary | ICD-10-CM | POA: Diagnosis not present

## 2014-12-10 DIAGNOSIS — N951 Menopausal and female climacteric states: Secondary | ICD-10-CM | POA: Diagnosis not present

## 2014-12-10 NOTE — Progress Notes (Signed)
Cindy Wong [birth year] [id number]        81 y.o.  G1P0100 for breast and pelvic exam. Several issues noted below.  Past medical history,surgical history, problem list, medications, allergies, family history and social history were all reviewed and documented as reviewed in the EPIC chart.  ROS:  Performed with pertinent positives and negatives included in the history, assessment and plan.   Additional significant findings :  none   Exam: Kim Counsellor Vitals:   12/10/14 0853  BP: 124/80  Height: 5\' 8"  (1.727 m)  Weight: 164 lb (74.39 kg)   General appearance:  Normal affect, orientation and appearance. Skin: Grossly normal HEENT: Without gross lesions.  No cervical or supraclavicular adenopathy. Thyroid normal.  Lungs:  Clear without wheezing, rales or rhonchi Cardiac: RR, without RMG Abdominal:  Soft, nontender, without masses, guarding, rebound, organomegaly or hernia Breasts:  Examined lying and sitting. Left without masses, retractions, discharge or axillary adenopathy.  Right status post mastectomy. No masses or adenopathy noted Pelvic:  Ext/BUS/vagina with atrophic changes  Cervix with atrophic changesperiods flush with upper vagina. Difficult to visualize  Adnexa  Without masses or tenderness    Anus and perineum  Normal   Rectovaginal  Normal sphincter tone without palpated masses or tenderness.    Assessment/Plan:  81 y.o. G50P0100 female for breast and pelvic exam.  1. Postmenopausal/menopausal symptoms/atrophic genital changes. Status post supracervical hysterectomy for leiomyoma. Has atrophic changes vaginally but is not bothered with significant symptoms. She does note hot flashes at night but these are tolerable and she desires no treatment for these. Will follow up with a significantly increased. 2. Osteopenia. Has used Fosamax in the past. Most recent DEXA in chart from 2013 was normal. Has follow up DEXA scheduled in December through Dr. Loren Racer  office. 3. History of breast cancer status post right mastectomy. Doing well from this with exam NED. Mammography coming due in February 2017. SBE monthly reviewed. 4. Pap smear 2010. No Pap smear done today. No history of significant abnormal Pap smears. We both agreed to stop screening per current screening guidelines as she is over the age of 81. 66. Colonoscopy 2011. Repeat at their recommended interval. 6. Health maintenance. No lab work done as this is done through Dr. Loren Racer office. Follow up in one year, sooner as needed.   Anastasio Auerbach MD, 9:12 AM 12/10/2014

## 2014-12-10 NOTE — Patient Instructions (Signed)

## 2015-02-08 DIAGNOSIS — I10 Essential (primary) hypertension: Secondary | ICD-10-CM | POA: Diagnosis not present

## 2015-02-08 DIAGNOSIS — M859 Disorder of bone density and structure, unspecified: Secondary | ICD-10-CM | POA: Diagnosis not present

## 2015-02-08 DIAGNOSIS — F411 Generalized anxiety disorder: Secondary | ICD-10-CM | POA: Diagnosis not present

## 2015-02-08 DIAGNOSIS — Z9010 Acquired absence of unspecified breast and nipple: Secondary | ICD-10-CM | POA: Diagnosis not present

## 2015-02-08 DIAGNOSIS — E7800 Pure hypercholesterolemia, unspecified: Secondary | ICD-10-CM | POA: Diagnosis not present

## 2015-02-08 DIAGNOSIS — Z853 Personal history of malignant neoplasm of breast: Secondary | ICD-10-CM | POA: Diagnosis not present

## 2015-02-08 DIAGNOSIS — K222 Esophageal obstruction: Secondary | ICD-10-CM | POA: Diagnosis not present

## 2015-02-08 DIAGNOSIS — F331 Major depressive disorder, recurrent, moderate: Secondary | ICD-10-CM | POA: Diagnosis not present

## 2015-02-08 DIAGNOSIS — Z6825 Body mass index (BMI) 25.0-25.9, adult: Secondary | ICD-10-CM | POA: Diagnosis not present

## 2015-02-08 DIAGNOSIS — Z1389 Encounter for screening for other disorder: Secondary | ICD-10-CM | POA: Diagnosis not present

## 2015-02-08 DIAGNOSIS — E559 Vitamin D deficiency, unspecified: Secondary | ICD-10-CM | POA: Diagnosis not present

## 2015-02-08 DIAGNOSIS — E119 Type 2 diabetes mellitus without complications: Secondary | ICD-10-CM | POA: Diagnosis not present

## 2015-02-18 DIAGNOSIS — Z1389 Encounter for screening for other disorder: Secondary | ICD-10-CM | POA: Diagnosis not present

## 2015-02-18 DIAGNOSIS — M25561 Pain in right knee: Secondary | ICD-10-CM | POA: Diagnosis not present

## 2015-02-18 DIAGNOSIS — Z6825 Body mass index (BMI) 25.0-25.9, adult: Secondary | ICD-10-CM | POA: Diagnosis not present

## 2015-02-18 DIAGNOSIS — M1180 Other specified crystal arthropathies, unspecified site: Secondary | ICD-10-CM | POA: Diagnosis not present

## 2015-03-10 ENCOUNTER — Encounter: Payer: Self-pay | Admitting: Podiatry

## 2015-03-10 ENCOUNTER — Ambulatory Visit (INDEPENDENT_AMBULATORY_CARE_PROVIDER_SITE_OTHER): Payer: Medicare Other | Admitting: Podiatry

## 2015-03-10 DIAGNOSIS — M79673 Pain in unspecified foot: Secondary | ICD-10-CM | POA: Diagnosis not present

## 2015-03-10 DIAGNOSIS — B351 Tinea unguium: Secondary | ICD-10-CM | POA: Diagnosis not present

## 2015-03-10 DIAGNOSIS — L84 Corns and callosities: Secondary | ICD-10-CM

## 2015-03-10 NOTE — Progress Notes (Signed)
Patient ID: Burma T Dula, female   DOB: 04/04/1931, 82 y.o.   MRN: [id number] Complaint:  Visit Type: Patient returns to my office for continued preventative foot care services. Complaint: Patient states" my nails have grown long and thick and become painful to walk and wear shoes" Patient has been diagnosed with DM with no foot complications. The patient presents for preventative foot care services. No changes to ROS  Podiatric Exam: Vascular: dorsalis pedis and posterior tibial pulses are palpable bilateral. Capillary return is immediate. Temperature gradient is WNL. Skin turgor WNL  Sensorium: Normal Semmes Weinstein monofilament test. Normal tactile sensation bilaterally. Nail Exam: Pt has thick disfigured discolored nails with subungual debris noted bilateral entire nail hallux through fifth toenails Ulcer Exam: There is no evidence of ulcer or pre-ulcerative changes or infection. Orthopedic Exam: Muscle tone and strength are WNL. No limitations in general ROM. No crepitus or effusions noted. Foot type and digits show no abnormalities. Bony prominences are unremarkable. Skin: No Porokeratosis. No infection or ulcers.  Heloma Durum fifth toe left foot.  Diagnosis:  Onychomycosis, , Pain in right toe, pain in left toes,  Corn 5th toe left foot.  Treatment & Plan Procedures and Treatment: Consent by patient was obtained for treatment procedures. The patient understood the discussion of treatment and procedures well. All questions were answered thoroughly reviewed. Debridement of mycotic and hypertrophic toenails, 1 through 5 bilateral and clearing of subungual debris. No ulceration, no infection noted. Debride heloma durum Return Visit-Office Procedure: Patient instructed to return to the office for a follow up visit 3 months for continued evaluation and treatment.   Saivion Goettel DPM 

## 2015-03-18 ENCOUNTER — Other Ambulatory Visit: Payer: Self-pay

## 2015-03-18 DIAGNOSIS — Z1231 Encounter for screening mammogram for malignant neoplasm of breast: Secondary | ICD-10-CM

## 2015-04-11 ENCOUNTER — Ambulatory Visit
Admission: RE | Admit: 2015-04-11 | Discharge: 2015-04-11 | Disposition: A | Discharge status: Home / Self Care | Payer: Medicare Other | Source: Ambulatory Visit

## 2015-04-11 DIAGNOSIS — Z1231 Encounter for screening mammogram for malignant neoplasm of breast: Secondary | ICD-10-CM | POA: Diagnosis not present

## 2015-04-13 ENCOUNTER — Other Ambulatory Visit: Payer: Self-pay | Admitting: Internal Medicine

## 2015-04-13 DIAGNOSIS — R8290 Unspecified abnormal findings in urine: Secondary | ICD-10-CM | POA: Diagnosis not present

## 2015-04-13 DIAGNOSIS — R5383 Other fatigue: Secondary | ICD-10-CM | POA: Diagnosis not present

## 2015-04-13 DIAGNOSIS — R928 Other abnormal and inconclusive findings on diagnostic imaging of breast: Secondary | ICD-10-CM

## 2015-04-13 DIAGNOSIS — F331 Major depressive disorder, recurrent, moderate: Secondary | ICD-10-CM | POA: Diagnosis not present

## 2015-04-13 DIAGNOSIS — F411 Generalized anxiety disorder: Secondary | ICD-10-CM | POA: Diagnosis not present

## 2015-04-13 DIAGNOSIS — N390 Urinary tract infection, site not specified: Secondary | ICD-10-CM | POA: Diagnosis not present

## 2015-04-13 DIAGNOSIS — Z6824 Body mass index (BMI) 24.0-24.9, adult: Secondary | ICD-10-CM | POA: Diagnosis not present

## 2015-04-14 ENCOUNTER — Encounter: Payer: Self-pay | Admitting: Gastroenterology

## 2015-04-19 ENCOUNTER — Ambulatory Visit
Admission: RE | Admit: 2015-04-19 | Discharge: 2015-04-19 | Disposition: A | Discharge status: Home / Self Care | Payer: Medicare Other | Source: Ambulatory Visit | Attending: Internal Medicine | Admitting: Internal Medicine

## 2015-04-19 ENCOUNTER — Other Ambulatory Visit: Payer: Self-pay | Admitting: Internal Medicine

## 2015-04-19 DIAGNOSIS — N6012 Diffuse cystic mastopathy of left breast: Secondary | ICD-10-CM | POA: Diagnosis not present

## 2015-04-19 DIAGNOSIS — R928 Other abnormal and inconclusive findings on diagnostic imaging of breast: Secondary | ICD-10-CM

## 2015-04-25 ENCOUNTER — Ambulatory Visit
Admission: RE | Admit: 2015-04-25 | Discharge: 2015-04-25 | Disposition: A | Discharge status: Home / Self Care | Payer: Medicare Other | Source: Ambulatory Visit | Attending: Internal Medicine | Admitting: Internal Medicine

## 2015-04-25 ENCOUNTER — Other Ambulatory Visit: Payer: Self-pay | Admitting: Internal Medicine

## 2015-04-25 DIAGNOSIS — N63 Unspecified lump in breast: Secondary | ICD-10-CM | POA: Diagnosis not present

## 2015-04-25 DIAGNOSIS — R928 Other abnormal and inconclusive findings on diagnostic imaging of breast: Secondary | ICD-10-CM

## 2015-04-25 DIAGNOSIS — N6012 Diffuse cystic mastopathy of left breast: Secondary | ICD-10-CM | POA: Diagnosis not present

## 2015-05-26 ENCOUNTER — Telehealth: Payer: Self-pay | Admitting: Hematology

## 2015-05-26 NOTE — Telephone Encounter (Signed)
Lvm advising appt chg from 9/6 to 9/7 due to [facility].

## 2015-06-07 DIAGNOSIS — L821 Other seborrheic keratosis: Secondary | ICD-10-CM | POA: Diagnosis not present

## 2015-06-07 DIAGNOSIS — L905 Scar conditions and fibrosis of skin: Secondary | ICD-10-CM | POA: Diagnosis not present

## 2015-06-09 ENCOUNTER — Ambulatory Visit (INDEPENDENT_AMBULATORY_CARE_PROVIDER_SITE_OTHER): Payer: Medicare Other | Admitting: Podiatry

## 2015-06-09 DIAGNOSIS — L84 Corns and callosities: Secondary | ICD-10-CM | POA: Diagnosis not present

## 2015-06-09 DIAGNOSIS — B351 Tinea unguium: Secondary | ICD-10-CM

## 2015-06-09 DIAGNOSIS — M79673 Pain in unspecified foot: Secondary | ICD-10-CM | POA: Diagnosis not present

## 2015-06-09 NOTE — Progress Notes (Signed)
Patient ID: Cindy Wong, female   DOB: 10/16/1931, 82 y.o.   MRN: [id number] Complaint:  Visit Type: Patient returns to my office for continued preventative foot care services. Complaint: Patient states" my nails have grown long and thick and become painful to walk and wear shoes" Patient has been diagnosed with DM with no foot complications. The patient presents for preventative foot care services. No changes to ROS  Podiatric Exam: Vascular: dorsalis pedis and posterior tibial pulses are palpable bilateral. Capillary return is immediate. Temperature gradient is WNL. Skin turgor WNL  Sensorium: Normal Semmes Weinstein monofilament test. Normal tactile sensation bilaterally. Nail Exam: Pt has thick disfigured discolored nails with subungual debris noted bilateral entire nail hallux through fifth toenails Ulcer Exam: There is no evidence of ulcer or pre-ulcerative changes or infection. Orthopedic Exam: Muscle tone and strength are WNL. No limitations in general ROM. No crepitus or effusions noted. Foot type and digits show no abnormalities. Bony prominences are unremarkable. Skin: No Porokeratosis. No infection or ulcers.  Heloma Durum fifth toe left foot.  Diagnosis:  Onychomycosis, , Pain in right toe, pain in left toes,  Corn 5th toe left foot.  Treatment & Plan Procedures and Treatment: Consent by patient was obtained for treatment procedures. The patient understood the discussion of treatment and procedures well. All questions were answered thoroughly reviewed. Debridement of mycotic and hypertrophic toenails, 1 through 5 bilateral and clearing of subungual debris. No ulceration, no infection noted. Debride heloma durum Return Visit-Office Procedure: Patient instructed to return to the office for a follow up visit 3 months for continued evaluation and treatment.   Joseguadalupe Stan DPM 

## 2015-08-10 DIAGNOSIS — E559 Vitamin D deficiency, unspecified: Secondary | ICD-10-CM | POA: Diagnosis not present

## 2015-08-10 DIAGNOSIS — N390 Urinary tract infection, site not specified: Secondary | ICD-10-CM | POA: Diagnosis not present

## 2015-08-10 DIAGNOSIS — E119 Type 2 diabetes mellitus without complications: Secondary | ICD-10-CM | POA: Diagnosis not present

## 2015-08-10 DIAGNOSIS — I10 Essential (primary) hypertension: Secondary | ICD-10-CM | POA: Diagnosis not present

## 2015-08-10 DIAGNOSIS — R8299 Other abnormal findings in urine: Secondary | ICD-10-CM | POA: Diagnosis not present

## 2015-08-17 DIAGNOSIS — F419 Anxiety disorder, unspecified: Secondary | ICD-10-CM | POA: Diagnosis not present

## 2015-08-17 DIAGNOSIS — M1180 Other specified crystal arthropathies, unspecified site: Secondary | ICD-10-CM | POA: Diagnosis not present

## 2015-08-17 DIAGNOSIS — M859 Disorder of bone density and structure, unspecified: Secondary | ICD-10-CM | POA: Diagnosis not present

## 2015-08-17 DIAGNOSIS — F331 Major depressive disorder, recurrent, moderate: Secondary | ICD-10-CM | POA: Diagnosis not present

## 2015-08-17 DIAGNOSIS — E7800 Pure hypercholesterolemia, unspecified: Secondary | ICD-10-CM | POA: Diagnosis not present

## 2015-08-17 DIAGNOSIS — Z1389 Encounter for screening for other disorder: Secondary | ICD-10-CM | POA: Diagnosis not present

## 2015-08-17 DIAGNOSIS — I10 Essential (primary) hypertension: Secondary | ICD-10-CM | POA: Diagnosis not present

## 2015-08-17 DIAGNOSIS — E559 Vitamin D deficiency, unspecified: Secondary | ICD-10-CM | POA: Diagnosis not present

## 2015-08-17 DIAGNOSIS — D692 Other nonthrombocytopenic purpura: Secondary | ICD-10-CM | POA: Diagnosis not present

## 2015-08-17 DIAGNOSIS — Z9010 Acquired absence of unspecified breast and nipple: Secondary | ICD-10-CM | POA: Diagnosis not present

## 2015-08-17 DIAGNOSIS — Z6823 Body mass index (BMI) 23.0-23.9, adult: Secondary | ICD-10-CM | POA: Diagnosis not present

## 2015-08-17 DIAGNOSIS — F411 Generalized anxiety disorder: Secondary | ICD-10-CM | POA: Diagnosis not present

## 2015-09-08 ENCOUNTER — Encounter: Payer: Self-pay | Admitting: Podiatry

## 2015-09-08 ENCOUNTER — Ambulatory Visit (INDEPENDENT_AMBULATORY_CARE_PROVIDER_SITE_OTHER): Payer: Medicare Other | Admitting: Podiatry

## 2015-09-08 DIAGNOSIS — L84 Corns and callosities: Secondary | ICD-10-CM

## 2015-09-08 DIAGNOSIS — M79673 Pain in unspecified foot: Secondary | ICD-10-CM | POA: Diagnosis not present

## 2015-09-08 DIAGNOSIS — B351 Tinea unguium: Secondary | ICD-10-CM | POA: Diagnosis not present

## 2015-09-08 NOTE — Progress Notes (Signed)
Patient ID: Cindy Wong, female   DOB: 07/09/1931, 82 y.o.   MRN: [id number] Complaint:  Visit Type: Patient returns to my office for continued preventative foot care services. Complaint: Patient states" my nails have grown long and thick and become painful to walk and wear shoes" Patient has been diagnosed with DM with no foot complications. The patient presents for preventative foot care services. No changes to ROS  Podiatric Exam: Vascular: dorsalis pedis and posterior tibial pulses are palpable bilateral. Capillary return is immediate. Temperature gradient is WNL. Skin turgor WNL  Sensorium: Normal Semmes Weinstein monofilament test. Normal tactile sensation bilaterally. Nail Exam: Pt has thick disfigured discolored nails with subungual debris noted bilateral entire nail hallux through fifth toenails Ulcer Exam: There is no evidence of ulcer or pre-ulcerative changes or infection. Orthopedic Exam: Muscle tone and strength are WNL. No limitations in general ROM. No crepitus or effusions noted. Foot type and digits show no abnormalities. Bony prominences are unremarkable. Skin: No Porokeratosis. No infection or ulcers.  Heloma Durum fifth toe left foot.  Diagnosis:  Onychomycosis, , Pain in right toe, pain in left toes,  Corn 5th toe left foot.  Treatment & Plan Procedures and Treatment: Consent by patient was obtained for treatment procedures. The patient understood the discussion of treatment and procedures well. All questions were answered thoroughly reviewed. Debridement of mycotic and hypertrophic toenails, 1 through 5 bilateral and clearing of subungual debris. No ulceration, no infection noted. Debride heloma durum Return Visit-Office Procedure: Patient instructed to return to the office for a follow up visit 3 months for continued evaluation and treatment.   Eiden Bagot DPM 

## 2015-09-29 ENCOUNTER — Other Ambulatory Visit: Payer: Self-pay | Admitting: Internal Medicine

## 2015-09-29 DIAGNOSIS — N6320 Unspecified lump in the left breast, unspecified quadrant: Secondary | ICD-10-CM

## 2015-09-29 DIAGNOSIS — Z9011 Acquired absence of right breast and nipple: Secondary | ICD-10-CM

## 2015-10-26 ENCOUNTER — Other Ambulatory Visit: Payer: Medicare Other

## 2015-10-26 ENCOUNTER — Ambulatory Visit: Payer: Medicare Other | Admitting: Hematology

## 2015-10-27 ENCOUNTER — Other Ambulatory Visit: Payer: Self-pay | Admitting: Hematology

## 2015-10-27 ENCOUNTER — Encounter: Payer: Self-pay | Admitting: Hematology

## 2015-10-27 ENCOUNTER — Other Ambulatory Visit (HOSPITAL_BASED_OUTPATIENT_CLINIC_OR_DEPARTMENT_OTHER): Payer: Medicare Other

## 2015-10-27 ENCOUNTER — Ambulatory Visit (HOSPITAL_BASED_OUTPATIENT_CLINIC_OR_DEPARTMENT_OTHER): Payer: Medicare Other | Admitting: Hematology

## 2015-10-27 ENCOUNTER — Telehealth: Payer: Self-pay | Admitting: Hematology

## 2015-10-27 VITALS — BP 142/87 | HR 73 | Temp 97.7°F | Resp 17 | Ht 68.0 in | Wt 155.0 lb

## 2015-10-27 DIAGNOSIS — Z853 Personal history of malignant neoplasm of breast: Secondary | ICD-10-CM

## 2015-10-27 DIAGNOSIS — Z85028 Personal history of other malignant neoplasm of stomach: Secondary | ICD-10-CM | POA: Diagnosis not present

## 2015-10-27 DIAGNOSIS — C49A2 Gastrointestinal stromal tumor of stomach: Secondary | ICD-10-CM

## 2015-10-27 DIAGNOSIS — Z86000 Personal history of in-situ neoplasm of breast: Secondary | ICD-10-CM | POA: Insufficient documentation

## 2015-10-27 DIAGNOSIS — Z8509 Personal history of malignant neoplasm of other digestive organs: Secondary | ICD-10-CM | POA: Insufficient documentation

## 2015-10-27 LAB — COMPREHENSIVE METABOLIC PANEL
ALT: 13 U/L (ref 0–55)
AST: 17 U/L (ref 5–34)
Albumin: 3.6 g/dL (ref 3.5–5.0)
Alkaline Phosphatase: 49 U/L (ref 40–150)
Anion Gap: 10 mEq/L (ref 3–11)
BUN: 16 mg/dL (ref 7.0–26.0)
CO2: 26 mEq/L (ref 22–29)
Calcium: 9.6 mg/dL (ref 8.4–10.4)
Chloride: 107 mEq/L (ref 98–109)
Creatinine: 0.9 mg/dL (ref 0.6–1.1)
EGFR: 65 mL/min/{1.73_m2} — ABNORMAL LOW (ref >90)
Glucose: 108 mg/dl (ref 70–140)
Potassium: 4.6 mEq/L (ref 3.5–5.1)
Sodium: 142 mEq/L (ref 136–145)
Total Bilirubin: 0.66 mg/dL (ref 0.20–1.20)
Total Protein: 6.7 g/dL (ref 6.4–8.3)

## 2015-10-27 LAB — CBC WITH DIFFERENTIAL/PLATELET
BASO%: 1 % (ref 0.0–2.0)
Basophils Absolute: 0.1 10*3/uL (ref 0.0–0.1)
EOS%: 1.7 % (ref 0.0–7.0)
Eosinophils Absolute: 0.1 10*3/uL (ref 0.0–0.5)
HCT: 45.8 % (ref 34.8–46.6)
HGB: 14.5 g/dL (ref 11.6–15.9)
LYMPH%: 27.7 % (ref 14.0–49.7)
MCH: 26.2 pg (ref 25.1–34.0)
MCHC: 31.7 g/dL (ref 31.5–36.0)
MCV: 82.7 fL (ref 79.5–101.0)
MONO#: 0.5 10*3/uL (ref 0.1–0.9)
MONO%: 7.3 % (ref 0.0–14.0)
NEUT#: 4.6 10*3/uL (ref 1.5–6.5)
NEUT%: 62.3 % (ref 38.4–76.8)
Platelets: 268 10*3/uL (ref 145–400)
RBC: 5.54 10*6/uL — ABNORMAL HIGH (ref 3.70–5.45)
RDW: 13.8 % (ref 11.2–14.5)
WBC: 7.4 10*3/uL (ref 3.9–10.3)
lymph#: 2 10*3/uL (ref 0.9–3.3)

## 2015-10-27 NOTE — Progress Notes (Signed)
Hematology and Oncology Follow Up Visit  Cindy Wong [id number] 21-Aug-1931 82 y.o. 10/27/2015 9:29 AM [name], MD  Principle Diagnosises: Ductal carcinoma in situ  (DCIS) of the right breast and a history of gastrointestinal stromal tumor (GIST)  Prior Therapy:  . DCIS right breast (1993) treated with lumpectomy and radiation treatments.  She  developed recurrent DCIS in 08/03/2005 and  underwent a right-sided mastectomy on 08/24/2005.  GIST involving the stomach s/p partial gastrectomy on 03/24/1995 without evidence of recurrence.  Current therapy: Surveillance  Interim History:  Cindy Wong returns for annual follow-up. She was last seen by me one year ago. She is doing well overall clinically, denies any pain, nausea, or other symptoms. She lives independently, with his nephew. She remains to be physically active, does volunteer job in the community, and goes to church, still drives, functions very well.  Medications: I have reviewed the patient's current medications.  Current Outpatient Prescriptions  Medication Sig Dispense Refill  . amLODipine (NORVASC) 5 MG tablet Take 5 mg by mouth daily.    . ferrous sulfate 325 (65 FE) MG tablet Take 325 mg by mouth daily with breakfast.      . glipiZIDE (GLUCOTROL XL) 10 MG 24 hr tablet Take 10 mg by mouth daily.      . Linagliptin-Metformin HCl (JENTADUETO) 2.5-500 MG TABS Take 1 tablet by mouth 2 (two) times daily.     Marland Kitchen lisinopril (PRINIVIL,ZESTRIL) 20 MG tablet Take 20 mg by mouth daily.     . Multiple Vitamins-Minerals (CENTRUM SILVER PO) Take 1 tablet by mouth daily.     . potassium chloride SA (K-DUR,KLOR-CON) 20 MEQ tablet Take 20 mEq by mouth daily.    . pravastatin (PRAVACHOL) 40 MG tablet Take 40 mg by mouth daily.      . sertraline (ZOLOFT) 50 MG tablet Take 50 mg by mouth daily.      Marland Kitchen triamterene-hydrochlorothiazide (MAXZIDE-25) 37.5-25 MG per tablet Take 1 tablet by mouth daily.     No current facility-administered  medications for this visit.      Allergies:  Allergies  Allergen Reactions  . Sulfonamide Derivatives Hives and Itching  . Sulfamethoxazole Rash   PMHx: 1. Intraductal carcinoma of the right breast diagnosed in July 1993,     treated with lumpectomy and radiation treatments.  The patient     developed recurrent ductal carcinoma in situ from a biopsy carried     out on 08/03/2005 and underwent a right-sided mastectomy on     08/24/2005. 2. History of GIST involving the stomach     status post partial gastrectomy on 03/24/1995 without evidence of     recurrence. 3. History of osteoporosis. 4. History of depression and anxiety. 5. 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 6. Right middle ear and mastoid cholesteatoma status post surgery on     06/18/2011 at [facility]. 7. Diabetes mellitus type 2. 8. Hypertension. 9. Dyslipidemia. 10.Irritable bowel syndrome. 11.Family history of colon cancer involving the patient's brother.  Surgical history, Social history, and Family History were reviewed and updated.  Review of Systems: Constitutional:  Negative for fever, chills, night sweats, anorexia, weight loss, pain. +Insomnia Cardiovascular: no chest pain or dyspnea on exertion Respiratory: no cough, shortness of breath, or wheezing Neurological: no TIA or stroke symptoms Dermatological: positive for seberottic  ENT: negative for - headaches or visual changes Skin: Negative. Gastrointestinal: no abdominal  pain, change in bowel habits, or black or bloody stools Genito-Urinary: no dysuria, trouble voiding, or hematuria Hematological and Lymphatic: negative for - bleeding problems or blood clots Breast: negative for breast lumps Musculoskeletal: negative for - joint pain Remaining ROS negative.  Physical Exam: Blood pressure (!) 142/87, pulse 73, temperature 97.7 F (36.5 C),  temperature source Oral, resp. rate 17, height 5\' 8"  (1.727 m), weight 155 lb (70.3 kg), SpO2 100 %. ECOG: 1 General appearance: alert and no distress Head: Normocephalic, without obvious abnormality, atraumatic Neck: no adenopathy, supple, symmetrical, trachea midline and thyroid not enlarged, symmetric, no tenderness/mass/nodules Lymph nodes: Cervical, supraclavicular, and axillary nodes normal. Breast: Right breast is surgically absent via a right-sided mastectomy without evidence of chest wall recurrence.  Left breast is benign.  No axillary adenopathy. Heart:regular rate and rhythm, S1, S2 normal, no murmur, click, rub or gallop Lung:chest clear, no wheezing, rales, normal symmetric air entry, Heart exam - S1, S2 normal, no murmur, no gallop, rate regular Abdomin: soft, non-tender, without masses or organomegaly EXT:No edema Skin: A cafe au lait spot was seen in the left lateral chest region.  Lab Results: CBC Latest Ref Rng & Units 10/27/2015 10/26/2014 10/23/2013  WBC 3.9 - 10.3 10e3/uL 7.4 8.3 8.3  Hemoglobin 11.6 - 15.9 g/dL 14.5 14.4 14.0  Hematocrit 34.8 - 46.6 % 45.8 45.3 44.4  Platelets 145 - 400 10e3/uL 268 297 314    CMP Latest Ref Rng & Units 10/26/2014 10/23/2013 01/26/2013  Glucose 70 - 140 mg/dl 164(H) 165(H) 192(H)  BUN 7.0 - 26.0 mg/dL 14.3 9.9 14  Creatinine 0.6 - 1.1 mg/dL 0.8 0.8 1.0  Sodium 136 - 145 mEq/L 140 142 138  Potassium 3.5 - 5.1 mEq/L 4.2 3.9 4.0  Chloride 96 - 112 mEq/L - - 105  CO2 22 - 29 mEq/L 24 25 26   Calcium 8.4 - 10.4 mg/dL 9.4 9.5 9.7  Total Protein 6.4 - 8.3 g/dL 6.7 6.8 -  Total Bilirubin 0.20 - 1.20 mg/dL 0.36 0.30 -  Alkaline Phos 40 - 150 U/L 55 53 -  AST 5 - 34 U/L 19 17 -  ALT 0 - 55 U/L 17 15 -   PATHOLOGY REPORT  Diagnosis Breast, left, needle core biopsy, 12:00 o'clock - FIBROCYSTIC CHANGES WITH FOCAL USUAL DUCTAL HYPERPLASIA. - NO ATYPIA OR MALIGNANCY IDENTIFIED. - SEE COMMENT. Microscopic Comment As sampled, the findings are best  diagnosed as above. Please correlate with clinical and radiologic impression. The findings are called to the [facility] on 04/28/2015. Dr. Lyndon Code has seen this case in consultation with agreement. (RH:kh 04/28/15)   Radiological Studies:  LEFT DIGITAL DIAGNOSTIC MAMMOGRAM AND [place] 04/25/2015 IMPRESSION: FINDINGS: Mammographic images were obtained following ultrasound guided biopsy of mass in the 12 o'clock location of the left breast. A ribbon shaped clip is identified in the upper central portion of the left breast as expected.  IMPRESSION: Tissue marker clip is in expected location following biopsy  Impression and Plan: 82 year old African-American female  1. History of recurrent right breast DCIS -DCIS right breast (1993) treated with lumpectomy and radiation treatments.  She  developed recurrent DCIS in 08/03/2005 and  underwent a right-sided mastectomy on 08/24/2005.  -She underwent a left breast biopsy in March 2017, which was negative. She is scheduled to have a repeated mammogram next week.  -She is clinically doing very well, physical exam was unremarkable, today's lab reviewed, all within normal limits, no evidence of recurrence. -Although she is 57, she is  fairly in good health, we'll continue annual screening left breast mammogram.  -I encouraged her to continue healthy diet and regular exercise. She is physically active  2. History of gastric GIST  - s/p partial gastrectomy on 03/24/1995  -She is clinically doing well, no evidence of recurrence -No need for routine scan surveillance, or follow up clinically.  Follow up -Next mammogram in a week -I'll see her back in one year  I spent 20 minutes with her visit today more than 50% time on face-to-face counseling.  Truitt Merle 10/27/2015

## 2015-10-27 NOTE — Telephone Encounter (Signed)
AVS REPORT AND APPT SCHD GIVEN PER 10/27/15 LOS.  °

## 2015-10-31 ENCOUNTER — Ambulatory Visit
Admission: RE | Admit: 2015-10-31 | Discharge: 2015-10-31 | Disposition: A | Discharge status: Home / Self Care | Payer: Medicare Other | Source: Ambulatory Visit | Attending: Internal Medicine | Admitting: Internal Medicine

## 2015-10-31 DIAGNOSIS — Z9011 Acquired absence of right breast and nipple: Secondary | ICD-10-CM

## 2015-10-31 DIAGNOSIS — N6320 Unspecified lump in the left breast, unspecified quadrant: Secondary | ICD-10-CM

## 2015-10-31 DIAGNOSIS — N6489 Other specified disorders of breast: Secondary | ICD-10-CM | POA: Diagnosis not present

## 2015-10-31 DIAGNOSIS — R928 Other abnormal and inconclusive findings on diagnostic imaging of breast: Secondary | ICD-10-CM | POA: Diagnosis not present

## 2015-12-01 ENCOUNTER — Ambulatory Visit (INDEPENDENT_AMBULATORY_CARE_PROVIDER_SITE_OTHER): Payer: Medicare Other | Admitting: Podiatry

## 2015-12-01 ENCOUNTER — Encounter: Payer: Self-pay | Admitting: Podiatry

## 2015-12-01 VITALS — Ht 68.0 in | Wt 155.0 lb

## 2015-12-01 DIAGNOSIS — M79676 Pain in unspecified toe(s): Secondary | ICD-10-CM | POA: Diagnosis not present

## 2015-12-01 DIAGNOSIS — E1151 Type 2 diabetes mellitus with diabetic peripheral angiopathy without gangrene: Secondary | ICD-10-CM | POA: Diagnosis not present

## 2015-12-01 DIAGNOSIS — B351 Tinea unguium: Secondary | ICD-10-CM

## 2015-12-01 NOTE — Progress Notes (Signed)
Patient ID: Cindy Wong, female   DOB: [birth year], 82 y.o.   MRN: [id number] Complaint:  Visit Type: Patient returns to my office for continued preventative foot care services. Complaint: Patient states" my nails have grown long and thick and become painful to walk and wear shoes" Patient has been diagnosed with DM with no foot complications. The patient presents for preventative foot care services. No changes to ROS  Podiatric Exam: Vascular: dorsalis pedis and posterior tibial pulses are palpable bilateral. Capillary return is immediate. Temperature gradient is WNL. Skin turgor WNL  Sensorium: Normal Semmes Weinstein monofilament test. Normal tactile sensation bilaterally. Nail Exam: Pt has thick disfigured discolored nails with subungual debris noted bilateral entire nail hallux through fifth toenails Ulcer Exam: There is no evidence of ulcer or pre-ulcerative changes or infection. Orthopedic Exam: Muscle tone and strength are WNL. No limitations in general ROM. No crepitus or effusions noted. Foot type and digits show no abnormalities. Bony prominences are unremarkable. Skin: No Porokeratosis. No infection or ulcers.  Heloma Durum fifth toe left foot.  Diagnosis:  Onychomycosis, , Pain in right toe, pain in left toes,  Corn 5th toe left foot.  Treatment & Plan Procedures and Treatment: Consent by patient was obtained for treatment procedures. The patient understood the discussion of treatment and procedures well. All questions were answered thoroughly reviewed. Debridement of mycotic and hypertrophic toenails, 1 through 5 bilateral and clearing of subungual debris. No ulceration, no infection noted. Debride heloma durum Return Visit-Office Procedure: Patient instructed to return to the office for a follow up visit 3 months for continued evaluation and treatment.   Gardiner Barefoot DPM

## 2015-12-14 ENCOUNTER — Encounter: Payer: Self-pay | Admitting: Gynecology

## 2015-12-14 ENCOUNTER — Ambulatory Visit (INDEPENDENT_AMBULATORY_CARE_PROVIDER_SITE_OTHER): Payer: Medicare Other | Admitting: Gynecology

## 2015-12-14 VITALS — BP 124/80 | Ht 68.8 in | Wt 158.0 lb

## 2015-12-14 DIAGNOSIS — Z01419 Encounter for gynecological examination (general) (routine) without abnormal findings: Secondary | ICD-10-CM

## 2015-12-14 DIAGNOSIS — C50911 Malignant neoplasm of unspecified site of right female breast: Secondary | ICD-10-CM

## 2015-12-14 DIAGNOSIS — N952 Postmenopausal atrophic vaginitis: Secondary | ICD-10-CM

## 2015-12-14 NOTE — Progress Notes (Signed)
    Cindy Wong [birth year] [id number]        82 y.o.  G1P0100  for breast and pelvic exam. Doing well from a gynecologic standpoint.  Past medical history,surgical history, problem list, medications, allergies, family history and social history were all reviewed and documented as reviewed in the EPIC chart.  ROS:  Performed with pertinent positives and negatives included in the history, assessment and plan.   Additional significant findings :  None   Exam: Cindy Wong assistant Vitals:   12/14/15 0946  BP: 124/80  Weight: 158 lb (71.7 kg)  Height: 5' 8.8" (1.748 m)   Body mass index is 23.47 kg/m.  General appearance:  Normal affect, orientation and appearance. Skin: Grossly normal HEENT: Without gross lesions.  No cervical or supraclavicular adenopathy. Thyroid normal.  Lungs:  Clear without wheezing, rales or rhonchi Cardiac: RR, without RMG Abdominal:  Soft, nontender, without masses, guarding, rebound, organomegaly or hernia Breasts:  Examined lying and sitting. Left without masses, retractions, discharge or axillary adenopathy. Right status post mastectomy. No masses or adenopathy noted. Pelvic:  Ext, BUS, Vagina with atrophic changes  Cervix flush with upper vagina  Adnexa without masses or tenderness    Anus and perineum normal   Rectovaginal normal sphincter tone without palpated masses or tenderness.    Assessment/Plan:  82 y.o. G38P0100 female for breast and pelvic exam. Status post supracervical hysterectomy in the past for leiomyoma.   1. Postmenopausal/atrophic genital changes. No significant hot flushes, night sweats, vaginal dryness or any vaginal bleeding. Continue monitor report any issues. 2. History of right breast cancer status post mastectomy. Exam NED. Mammography 10/2015. Continue with annual mammography when due. SBE monthly reviewed. 3. Osteopenia. DEXA 2013 normal. History of Fosamax use in the past. Monitor through Dr. to [facility] office. We'll  continue to follow up with them in reference to this. 4. Pap smear 2010. No Pap smear done today. No history of abnormal Pap smears. Per current screening guidelines based on age we both agree to stop screening. 5. Colonoscopy 2011. Repeat at their recommended interval. 6. Health maintenance. No routine lab work done as this is done through Dr. Mina Wong 6 office. Follow up 1 year, sooner as needed.   Anastasio Auerbach MD, 10:27 AM 12/14/2015

## 2015-12-14 NOTE — Patient Instructions (Signed)

## 2016-01-18 DIAGNOSIS — E119 Type 2 diabetes mellitus without complications: Secondary | ICD-10-CM | POA: Diagnosis not present

## 2016-01-18 DIAGNOSIS — H2513 Age-related nuclear cataract, bilateral: Secondary | ICD-10-CM | POA: Diagnosis not present

## 2016-01-18 DIAGNOSIS — H25013 Cortical age-related cataract, bilateral: Secondary | ICD-10-CM | POA: Diagnosis not present

## 2016-01-18 DIAGNOSIS — Z0100 Encounter for examination of eyes and vision without abnormal findings: Secondary | ICD-10-CM | POA: Diagnosis not present

## 2016-02-08 DIAGNOSIS — F331 Major depressive disorder, recurrent, moderate: Secondary | ICD-10-CM | POA: Diagnosis not present

## 2016-02-08 DIAGNOSIS — Z23 Encounter for immunization: Secondary | ICD-10-CM | POA: Diagnosis not present

## 2016-02-08 DIAGNOSIS — M859 Disorder of bone density and structure, unspecified: Secondary | ICD-10-CM | POA: Diagnosis not present

## 2016-02-08 DIAGNOSIS — Z9010 Acquired absence of unspecified breast and nipple: Secondary | ICD-10-CM | POA: Diagnosis not present

## 2016-02-08 DIAGNOSIS — D692 Other nonthrombocytopenic purpura: Secondary | ICD-10-CM | POA: Diagnosis not present

## 2016-02-08 DIAGNOSIS — E119 Type 2 diabetes mellitus without complications: Secondary | ICD-10-CM | POA: Diagnosis not present

## 2016-02-08 DIAGNOSIS — Z6824 Body mass index (BMI) 24.0-24.9, adult: Secondary | ICD-10-CM | POA: Diagnosis not present

## 2016-02-08 DIAGNOSIS — F419 Anxiety disorder, unspecified: Secondary | ICD-10-CM | POA: Diagnosis not present

## 2016-02-08 DIAGNOSIS — Z853 Personal history of malignant neoplasm of breast: Secondary | ICD-10-CM | POA: Diagnosis not present

## 2016-02-08 DIAGNOSIS — E7800 Pure hypercholesterolemia, unspecified: Secondary | ICD-10-CM | POA: Diagnosis not present

## 2016-02-08 DIAGNOSIS — I10 Essential (primary) hypertension: Secondary | ICD-10-CM | POA: Diagnosis not present

## 2016-02-08 DIAGNOSIS — E559 Vitamin D deficiency, unspecified: Secondary | ICD-10-CM | POA: Diagnosis not present

## 2016-02-16 ENCOUNTER — Ambulatory Visit (INDEPENDENT_AMBULATORY_CARE_PROVIDER_SITE_OTHER): Payer: Medicare Other | Admitting: Podiatry

## 2016-02-16 ENCOUNTER — Encounter: Payer: Self-pay | Admitting: Podiatry

## 2016-02-16 DIAGNOSIS — B351 Tinea unguium: Secondary | ICD-10-CM | POA: Diagnosis not present

## 2016-02-16 DIAGNOSIS — L84 Corns and callosities: Secondary | ICD-10-CM | POA: Diagnosis not present

## 2016-02-16 DIAGNOSIS — M79676 Pain in unspecified toe(s): Secondary | ICD-10-CM | POA: Diagnosis not present

## 2016-02-16 NOTE — Progress Notes (Signed)
Patient ID: Cindy Wong, female   DOB: 14-Oct-1931, 82 y.o.   MRN: [id number] Complaint:  Visit Type: Patient returns to my office for continued preventative foot care services. Complaint: Patient states" my nails have grown long and thick and become painful to walk and wear shoes" Patient has been diagnosed with DM with no foot complications. The patient presents for preventative foot care services. No changes to ROS  Podiatric Exam: Vascular: dorsalis pedis and posterior tibial pulses are palpable bilateral. Capillary return is immediate. Temperature gradient is WNL. Skin turgor WNL  Sensorium: Normal Semmes Weinstein monofilament test. Normal tactile sensation bilaterally. Nail Exam: Pt has thick disfigured discolored nails with subungual debris noted bilateral entire nail hallux through fifth toenails Ulcer Exam: There is no evidence of ulcer or pre-ulcerative changes or infection. Orthopedic Exam: Muscle tone and strength are WNL. No limitations in general ROM. No crepitus or effusions noted. Foot type and digits show no abnormalities. Bony prominences are unremarkable. Skin: No Porokeratosis. No infection or ulcers.  Heloma Durum fifth toe left foot.  Diagnosis:  Onychomycosis, , Pain in right toe, pain in left toes,  Corn 5th toe left foot.  Treatment & Plan Procedures and Treatment: Consent by patient was obtained for treatment procedures. The patient understood the discussion of treatment and procedures well. All questions were answered thoroughly reviewed. Debridement of mycotic and hypertrophic toenails, 1 through 5 bilateral and clearing of subungual debris. No ulceration, no infection noted. Debride heloma durum Return Visit-Office Procedure: Patient instructed to return to the office for a follow up visit 3 months for continued evaluation and treatment.   Gardiner Barefoot DPM

## 2016-05-10 ENCOUNTER — Ambulatory Visit (INDEPENDENT_AMBULATORY_CARE_PROVIDER_SITE_OTHER): Payer: Medicare Other | Admitting: Podiatry

## 2016-05-10 DIAGNOSIS — M79676 Pain in unspecified toe(s): Secondary | ICD-10-CM

## 2016-05-10 DIAGNOSIS — B351 Tinea unguium: Secondary | ICD-10-CM

## 2016-05-10 DIAGNOSIS — L84 Corns and callosities: Secondary | ICD-10-CM

## 2016-05-10 NOTE — Progress Notes (Signed)
Patient ID: Cindy Wong, female   DOB: Sep 24, 1931, 83 y.o.   MRN: [id number] Complaint:  Visit Type: Patient returns to my office for continued preventative foot care services. Complaint: Patient states" my nails have grown long and thick and become painful to walk and wear shoes" Patient has been diagnosed with DM with no foot complications. The patient presents for preventative foot care services. No changes to ROS  Podiatric Exam: Vascular: dorsalis pedis and posterior tibial pulses are palpable bilateral. Capillary return is immediate. Temperature gradient is WNL. Skin turgor WNL  Sensorium: Normal Semmes Weinstein monofilament test. Normal tactile sensation bilaterally. Nail Exam: Pt has thick disfigured discolored nails with subungual debris noted bilateral entire nail hallux through fifth toenails Ulcer Exam: There is no evidence of ulcer or pre-ulcerative changes or infection. Orthopedic Exam: Muscle tone and strength are WNL. No limitations in general ROM. No crepitus or effusions noted. Foot type and digits show no abnormalities. Bony prominences are unremarkable. Skin: No Porokeratosis. No infection or ulcers.  Heloma Durum fifth toe left foot.  Diagnosis:  Onychomycosis, , Pain in right toe, pain in left toes,  Corn 5th toe left foot asymptomatic  Treatment & Plan Procedures and Treatment: Consent by patient was obtained for treatment procedures. The patient understood the discussion of treatment and procedures well. All questions were answered thoroughly reviewed. Debridement of mycotic and hypertrophic toenails, 1 through 5 bilateral and clearing of subungual debris. No ulceration, no infection noted.  Return Visit-Office Procedure: Patient instructed to return to the office for a follow up visit 3 months for continued evaluation and treatment.   Gardiner Barefoot DPM

## 2016-05-21 DIAGNOSIS — Z6825 Body mass index (BMI) 25.0-25.9, adult: Secondary | ICD-10-CM | POA: Diagnosis not present

## 2016-05-21 DIAGNOSIS — L0889 Other specified local infections of the skin and subcutaneous tissue: Secondary | ICD-10-CM | POA: Diagnosis not present

## 2016-05-21 DIAGNOSIS — R2231 Localized swelling, mass and lump, right upper limb: Secondary | ICD-10-CM | POA: Diagnosis not present

## 2016-06-06 DIAGNOSIS — D1800 Hemangioma unspecified site: Secondary | ICD-10-CM | POA: Diagnosis not present

## 2016-06-06 DIAGNOSIS — L821 Other seborrheic keratosis: Secondary | ICD-10-CM | POA: Diagnosis not present

## 2016-07-26 ENCOUNTER — Encounter: Payer: Self-pay | Admitting: Podiatry

## 2016-07-26 ENCOUNTER — Ambulatory Visit (INDEPENDENT_AMBULATORY_CARE_PROVIDER_SITE_OTHER): Payer: Medicare Other | Admitting: Podiatry

## 2016-07-26 DIAGNOSIS — L84 Corns and callosities: Secondary | ICD-10-CM

## 2016-07-26 DIAGNOSIS — B351 Tinea unguium: Secondary | ICD-10-CM | POA: Diagnosis not present

## 2016-07-26 DIAGNOSIS — M79676 Pain in unspecified toe(s): Secondary | ICD-10-CM

## 2016-07-26 NOTE — Progress Notes (Signed)
Patient ID: Cindy Wong, female   DOB: Jul 21, 1931, 83 y.o.   MRN: [id number] Complaint:  Visit Type: Patient returns to my office for continued preventative foot care services. Complaint: Patient states" my nails have grown long and thick and become painful to walk and wear shoes" Patient has been diagnosed with DM with no foot complications. The patient presents for preventative foot care services. No changes to ROS  Podiatric Exam: Vascular: dorsalis pedis and posterior tibial pulses are palpable bilateral. Capillary return is immediate. Temperature gradient is WNL. Skin turgor WNL  Sensorium: Normal Semmes Weinstein monofilament test. Normal tactile sensation bilaterally. Nail Exam: Pt has thick disfigured discolored nails with subungual debris noted bilateral entire nail hallux through fifth toenails Ulcer Exam: There is no evidence of ulcer or pre-ulcerative changes or infection. Orthopedic Exam: Muscle tone and strength are WNL. No limitations in general ROM. No crepitus or effusions noted. Foot type and digits show no abnormalities. Bony prominences are unremarkable. Skin: No Porokeratosis. No infection or ulcers.  Heloma Durum fifth toe left foot.  Diagnosis:  Onychomycosis, , Pain in right toe, pain in left toes,  Corn 5th toe left foot.  Treatment & Plan Procedures and Treatment: Consent by patient was obtained for treatment procedures. The patient understood the discussion of treatment and procedures well. All questions were answered thoroughly reviewed. Debridement of mycotic and hypertrophic toenails, 1 through 5 bilateral and clearing of subungual debris. No ulceration, no infection noted. Debride heloma durum Return Visit-Office Procedure: Patient instructed to return to the office for a follow up visit 3 months for continued evaluation and treatment.   Gardiner Barefoot DPM

## 2016-08-17 DIAGNOSIS — I10 Essential (primary) hypertension: Secondary | ICD-10-CM | POA: Diagnosis not present

## 2016-08-17 DIAGNOSIS — Z0000 Encounter for general adult medical examination without abnormal findings: Secondary | ICD-10-CM | POA: Diagnosis not present

## 2016-08-17 DIAGNOSIS — E119 Type 2 diabetes mellitus without complications: Secondary | ICD-10-CM | POA: Diagnosis not present

## 2016-08-17 DIAGNOSIS — E559 Vitamin D deficiency, unspecified: Secondary | ICD-10-CM | POA: Diagnosis not present

## 2016-08-18 ENCOUNTER — Emergency Department (HOSPITAL_COMMUNITY)
Admission: EM | Admit: 2016-08-18 | Discharge: 2016-08-18 | Disposition: A | Discharge status: Home / Self Care | Payer: Medicare Other | Attending: Emergency Medicine | Admitting: Emergency Medicine

## 2016-08-18 ENCOUNTER — Emergency Department (HOSPITAL_COMMUNITY): Payer: Medicare Other

## 2016-08-18 ENCOUNTER — Encounter (HOSPITAL_COMMUNITY): Payer: Self-pay | Admitting: Emergency Medicine

## 2016-08-18 DIAGNOSIS — E119 Type 2 diabetes mellitus without complications: Secondary | ICD-10-CM | POA: Diagnosis not present

## 2016-08-18 DIAGNOSIS — I10 Essential (primary) hypertension: Secondary | ICD-10-CM | POA: Diagnosis not present

## 2016-08-18 DIAGNOSIS — M25571 Pain in right ankle and joints of right foot: Secondary | ICD-10-CM | POA: Insufficient documentation

## 2016-08-18 DIAGNOSIS — Z853 Personal history of malignant neoplasm of breast: Secondary | ICD-10-CM | POA: Diagnosis not present

## 2016-08-18 DIAGNOSIS — Z79899 Other long term (current) drug therapy: Secondary | ICD-10-CM | POA: Diagnosis not present

## 2016-08-18 DIAGNOSIS — M19071 Primary osteoarthritis, right ankle and foot: Secondary | ICD-10-CM | POA: Diagnosis not present

## 2016-08-18 LAB — CBG MONITORING, ED: Glucose-Capillary: 166 mg/dL — ABNORMAL HIGH (ref 65–99)

## 2016-08-18 NOTE — Discharge Instructions (Signed)
Please take tylenol as needed for your pain.  If your symptoms do not improve in 1-2 weeks then please follow up with your podiatrist or the orthopedist listed above.

## 2016-08-18 NOTE — ED Notes (Signed)
Bed: [facility] Expected date:  Expected time:  Means of arrival:  Comments: 

## 2016-08-18 NOTE — ED Provider Notes (Signed)
[place] DEPT Provider Note   CSN: [id number] Arrival date & time: 08/18/16  1022     History   Chief Complaint Chief Complaint  Patient presents with  . Ankle Pain    HPI Cindy Wong is a 83 y.o. female she presents with three days of right medial ankle pain.  Her pain is worse when she walks or bears weight on her ankle.  No Recent history of trauma. She reports that she has not tried anything for symptomatically relief prior to arrival. She reports that she was diagnosed with pseudogout a few months ago and it felt similar.   HPI  Past Medical History:  Diagnosis Date  . Adenomatous colon polyp    tubular  . Breast cancer ([place])   . Diabetes mellitus   . Hx of meningitis 2012  . Hyperlipemia   . Hypertension   . Stomach cancer [facility])     Patient Active Problem List   Diagnosis Date Noted  . History of ductal carcinoma in situ (DCIS) of breast 10/27/2015  . Personal history of malignant gastrointestinal stromal tumor (GIST) 10/27/2015  . DCIS (ductal carcinoma in situ) 10/23/2011  . Fibroid   . Diabetes mellitus   . Cancer ([place])   . Hypertension   . Meningitis due to bacterium 02/05/2011  . Chronic mastoiditis of right side 02/05/2011  . BLOOD IN STOOL 07/25/2009    Past Surgical History:  Procedure Laterality Date  . ABDOMINAL HYSTERECTOMY  1974   Supracervical  . BREAST LUMPECTOMY Right 1993  . BREAST SURGERY     Mastectomy  . CHOLECYSTECTOMY    . MASTECTOMY Right   . meningitis    . OOPHORECTOMY     One ovary removed--?side  . Stomach tumor      OB History    Gravida Para Term Preterm AB Living   1 1   1    0   SAB TAB Ectopic Multiple Live Births                   Home Medications    Prior to Admission medications   Medication Sig Start Date End Date Taking? Authorizing Provider  amLODipine (NORVASC) 5 MG tablet Take 5 mg by mouth daily.    [provider]  ferrous sulfate 325 (65 FE) MG tablet Take 325 mg by mouth daily  with breakfast.      [provider]  glipiZIDE (GLUCOTROL XL) 10 MG 24 hr tablet Take 10 mg by mouth daily.      [provider]  Linagliptin-Metformin HCl (JENTADUETO) 2.5-500 MG TABS Take 1 tablet by mouth 2 (two) times daily.     [provider]  lisinopril (PRINIVIL,ZESTRIL) 20 MG tablet Take 20 mg by mouth daily.     [provider]  Multiple Vitamins-Minerals (CENTRUM SILVER PO) Take 1 tablet by mouth daily.     [provider]  potassium chloride SA (K-DUR,KLOR-CON) 20 MEQ tablet Take 20 mEq by mouth daily.    [provider]  pravastatin (PRAVACHOL) 40 MG tablet Take 40 mg by mouth daily.      [provider]  sertraline (ZOLOFT) 50 MG tablet Take 50 mg by mouth daily.      [provider]  triamterene-hydrochlorothiazide (MAXZIDE-25) 37.5-25 MG per tablet Take 1 tablet by mouth daily.    [provider]    Family History Family History  Problem Relation Age of Onset  . Breast cancer Sister 68  .  Diabetes Sister   . Hypertension Sister   . Colon cancer Sister   . Melanoma Sister   . Heart disease Brother   . Diabetes Brother   . Hypertension Brother   . Prostate cancer Brother   . Colon cancer Brother     Social History Social History  Substance Use Topics  . Smoking status: Never Smoker  . Smokeless tobacco: Never Used  . Alcohol use No     Allergies   Sulfonamide derivatives and Sulfamethoxazole   Review of Systems Review of Systems  Constitutional: Negative for chills, fatigue and fever.  Musculoskeletal: Positive for arthralgias and joint swelling. Negative for neck pain.  Skin: Positive for color change (mild redness to medial R ankle).  Hematological: Does not bruise/bleed easily.     Physical Exam Updated Vital Signs BP (!) 166/57 (BP Location: Left Arm)   Pulse 93   Temp 97.6 F (36.4 C) (Oral)   Resp 18   Ht 5' 7.5" (1.715 m)   Wt 74.4 kg (164 lb)   SpO2 100%    BMI 25.31 kg/m   Physical Exam  Constitutional: She appears well-developed and well-nourished.  HENT:  Head: Normocephalic and atraumatic.  Musculoskeletal:  Mild tenderness to R medial ankle/foot with mild swelling.  No TTP over medial or lateral malleoli. Bilaterally intact DP/PT pulses, able to bear weight on bilateral ankle, good AROM to ankle. No proximal tibial tenderness.  No obvious skin breaks to right ankle/foot.    Neurological: She is alert. She exhibits normal muscle tone.  Skin: Skin is warm and dry. She is not diaphoretic.  Psychiatric: She has a normal mood and affect. Her behavior is normal.  Nursing note and vitals reviewed.    ED Treatments / Results  Labs (all labs ordered are listed, but only abnormal results are displayed) Labs Reviewed  CBG MONITORING, ED - Abnormal; Notable for the following:       Result Value   Glucose-Capillary 166 (*)    All other components within normal limits    EKG  EKG Interpretation None       Radiology Dg Ankle Complete Right  Result Date: 08/18/2016 CLINICAL DATA:  Right medial ankle pain for 3 days. No known injury. EXAM: RIGHT ANKLE - COMPLETE 3+ VIEW COMPARISON:  None. FINDINGS: There is no evidence of fracture, dislocation, or joint effusion. There is no evidence of focal bone abnormality. Minimal osteoarthritic changes with wispy calcifications within the posterior ankle mortise. Soft tissues are unremarkable. IMPRESSION: No acute fracture or dislocation identified about the right Ankle. Osteoarthritic changes with mild calcifications within the posterior ankle mortise. This may represent mild osteoarthritis or potentially CPPD arthropathy. Electronically Signed   By: Fidela Salisbury M.D.   On: 08/18/2016 11:10    Procedures Procedures (including critical care time)  Medications Ordered in ED Medications - No data to display   Initial Impression / Assessment and Plan / ED Course  I have reviewed the triage  vital signs and the nursing notes.  Pertinent labs & imaging results that were available during my care of the patient were reviewed by me and considered in my medical decision making (see chart for details).    Cindy Wong presents with ankle pain with out recent history of trauma.  Based on history of pseudogout and radiology that may be consistent with pseudogout I suspect pseudo gout.  Patient reports she is unable to take NSAIDS for pain, gave her instructions on conservative treatment and tylenol  use.  I do not suspect septic arthritis, as patient is able to bear weight, is afebrile, and has a history of similar diagnosis pseudogout.     At this time there does not appear to be any evidence of an acute emergency medical condition and the patient appears stable for discharge with appropriate outpatient follow up.Diagnosis was discussed with patient who verbalizes understanding and is agreeable to discharge. Pt case discussed with Dr. Kathrynn Humble who agrees with my plan.     Final Clinical Impressions(s) / ED Diagnoses   Final diagnoses:  Right ankle pain, unspecified chronicity    New Prescriptions New Prescriptions   No medications on file     Ollen Gross 08/18/16 2104    Varney Biles, MD 08/19/16 [phone]

## 2016-08-18 NOTE — ED Triage Notes (Signed)
Pt reports R medial ankle pain for the past 3 days. No known injury. Pain worse when bearing weight.

## 2016-08-24 DIAGNOSIS — I10 Essential (primary) hypertension: Secondary | ICD-10-CM | POA: Diagnosis not present

## 2016-08-24 DIAGNOSIS — F411 Generalized anxiety disorder: Secondary | ICD-10-CM | POA: Diagnosis not present

## 2016-08-24 DIAGNOSIS — Z853 Personal history of malignant neoplasm of breast: Secondary | ICD-10-CM | POA: Diagnosis not present

## 2016-08-24 DIAGNOSIS — Z6824 Body mass index (BMI) 24.0-24.9, adult: Secondary | ICD-10-CM | POA: Diagnosis not present

## 2016-08-24 DIAGNOSIS — Z1389 Encounter for screening for other disorder: Secondary | ICD-10-CM | POA: Diagnosis not present

## 2016-08-24 DIAGNOSIS — E559 Vitamin D deficiency, unspecified: Secondary | ICD-10-CM | POA: Diagnosis not present

## 2016-08-24 DIAGNOSIS — M1180 Other specified crystal arthropathies, unspecified site: Secondary | ICD-10-CM | POA: Diagnosis not present

## 2016-08-24 DIAGNOSIS — E119 Type 2 diabetes mellitus without complications: Secondary | ICD-10-CM | POA: Diagnosis not present

## 2016-08-24 DIAGNOSIS — M859 Disorder of bone density and structure, unspecified: Secondary | ICD-10-CM | POA: Diagnosis not present

## 2016-08-24 DIAGNOSIS — Z9010 Acquired absence of unspecified breast and nipple: Secondary | ICD-10-CM | POA: Diagnosis not present

## 2016-08-24 DIAGNOSIS — E7800 Pure hypercholesterolemia, unspecified: Secondary | ICD-10-CM | POA: Diagnosis not present

## 2016-08-24 DIAGNOSIS — Z0000 Encounter for general adult medical examination without abnormal findings: Secondary | ICD-10-CM | POA: Diagnosis not present

## 2016-09-28 ENCOUNTER — Other Ambulatory Visit: Payer: Self-pay | Admitting: Internal Medicine

## 2016-09-28 DIAGNOSIS — Z1231 Encounter for screening mammogram for malignant neoplasm of breast: Secondary | ICD-10-CM

## 2016-10-09 ENCOUNTER — Ambulatory Visit
Admission: RE | Admit: 2016-10-09 | Discharge: 2016-10-09 | Disposition: A | Discharge status: Home / Self Care | Payer: Medicare Other | Source: Ambulatory Visit | Attending: Internal Medicine | Admitting: Internal Medicine

## 2016-10-09 DIAGNOSIS — M1180 Other specified crystal arthropathies, unspecified site: Secondary | ICD-10-CM | POA: Diagnosis not present

## 2016-10-09 DIAGNOSIS — Z23 Encounter for immunization: Secondary | ICD-10-CM | POA: Diagnosis not present

## 2016-10-09 DIAGNOSIS — M545 Low back pain: Secondary | ICD-10-CM | POA: Diagnosis not present

## 2016-10-09 DIAGNOSIS — Z1231 Encounter for screening mammogram for malignant neoplasm of breast: Secondary | ICD-10-CM

## 2016-10-09 DIAGNOSIS — Z6824 Body mass index (BMI) 24.0-24.9, adult: Secondary | ICD-10-CM | POA: Diagnosis not present

## 2016-10-09 HISTORY — DX: Personal history of irradiation: Z92.3

## 2016-10-23 ENCOUNTER — Ambulatory Visit (INDEPENDENT_AMBULATORY_CARE_PROVIDER_SITE_OTHER): Payer: Medicare Other | Admitting: Podiatry

## 2016-10-23 ENCOUNTER — Encounter: Payer: Self-pay | Admitting: Podiatry

## 2016-10-23 DIAGNOSIS — B351 Tinea unguium: Secondary | ICD-10-CM | POA: Diagnosis not present

## 2016-10-23 DIAGNOSIS — M79676 Pain in unspecified toe(s): Secondary | ICD-10-CM | POA: Diagnosis not present

## 2016-10-23 DIAGNOSIS — L84 Corns and callosities: Secondary | ICD-10-CM

## 2016-10-23 DIAGNOSIS — E1151 Type 2 diabetes mellitus with diabetic peripheral angiopathy without gangrene: Secondary | ICD-10-CM | POA: Diagnosis not present

## 2016-10-23 NOTE — Progress Notes (Signed)
Patient ID: Cindy Wong, female   DOB: 06-12-1931, 83 y.o.   MRN: [id number] Complaint:  Visit Type: Patient returns to my office for continued preventative foot care services. Complaint: Patient states" my nails have grown long and thick and become painful to walk and wear shoes" Patient has been diagnosed with DM with no foot complications. The patient presents for preventative foot care services. No changes to ROS  Podiatric Exam: Vascular: dorsalis pedis and posterior tibial pulses are palpable bilateral. Capillary return is immediate. Temperature gradient is WNL. Skin turgor WNL  Sensorium: Normal Semmes Weinstein monofilament test. Normal tactile sensation bilaterally. Nail Exam: Pt has thick disfigured discolored nails with subungual debris noted bilateral entire nail hallux through fifth toenails Ulcer Exam: There is no evidence of ulcer or pre-ulcerative changes or infection. Orthopedic Exam: Muscle tone and strength are WNL. No limitations in general ROM. No crepitus or effusions noted. Foot type and digits show no abnormalities. Bony prominences are unremarkable. Skin: No Porokeratosis. No infection or ulcers.  Heloma Durum fifth toe left foot.  Diagnosis:  Onychomycosis, , Pain in right toe, pain in left toes,  Corn 5th toe left foot.  Treatment & Plan Procedures and Treatment: Consent by patient was obtained for treatment procedures. The patient understood the discussion of treatment and procedures well. All questions were answered thoroughly reviewed. Debridement of mycotic and hypertrophic toenails, 1 through 5 bilateral and clearing of subungual debris. No ulceration, no infection noted. Debride heloma durum Return Visit-Office Procedure: Patient instructed to return to the office for a follow up visit 3 months for continued evaluation and treatment.   Gardiner Barefoot DPM

## 2016-10-25 ENCOUNTER — Ambulatory Visit: Payer: Medicare Other | Admitting: Hematology

## 2016-10-25 ENCOUNTER — Other Ambulatory Visit: Payer: Medicare Other

## 2016-11-02 NOTE — Progress Notes (Signed)
Hematology and Oncology Follow Up Visit  Cindy Wong [id number] 06/10/1931 83 y.o. 11/05/2016 11:11 PM Cindy Wong, Cindy Him, MD  Principle Diagnosises: Ductal carcinoma in situ  (DCIS) of the right breast and a history of gastrointestinal stromal tumor (GIST)  Prior Therapy:  . DCIS right breast (1993) treated with lumpectomy and radiation treatments.  She  developed recurrent DCIS in 08/03/2005 and  underwent a right-sided mastectomy on 08/24/2005.  GIST involving the stomach s/p partial gastrectomy on 03/24/1995 without evidence of recurrence.  Current therapy: Surveillance  Interim History:   Ms Cindy Wong returns for annual follow-up. She was last seen by me one year ago.  She reports to having low HR, 40, in clinic today and being slightly lightheaded lately. She has not seen cardiologist or had EKG in a while.  She is doing well overall clinically, denies any pain, nausea, or other symptoms.  She will f/u with Cindy Wong for her irregular heartbeat management.   Medications: I have reviewed the patient's current medications.  Current Outpatient Prescriptions  Medication Sig Dispense Refill  . amLODipine (NORVASC) 5 MG tablet Take 5 mg by mouth daily.    . ferrous sulfate 325 (65 FE) MG tablet Take 325 mg by mouth daily with breakfast.      . glipiZIDE (GLUCOTROL XL) 10 MG 24 hr tablet Take 10 mg by mouth daily.      . Linagliptin-Metformin HCl (JENTADUETO) 2.5-500 MG TABS Take 1 tablet by mouth 2 (two) times daily.     Marland Kitchen lisinopril (PRINIVIL,ZESTRIL) 20 MG tablet Take 20 mg by mouth daily.     . Multiple Vitamins-Minerals (CENTRUM SILVER PO) Take 1 tablet by mouth daily.     . potassium chloride SA (K-DUR,KLOR-CON) 20 MEQ tablet Take 20 mEq by mouth daily.    . pravastatin (PRAVACHOL) 40 MG tablet Take 40 mg by mouth daily.      . sertraline (ZOLOFT) 50 MG tablet Take 50 mg by mouth daily.      Marland Kitchen triamterene-hydrochlorothiazide (MAXZIDE-25) 37.5-25 MG per tablet Take 1 tablet by  mouth daily.     No current facility-administered medications for this visit.      Allergies:  Allergies  Allergen Reactions  . Sulfonamide Derivatives Hives and Itching  . Sulfamethoxazole Rash   PMHx: 1. Intraductal carcinoma of the right breast diagnosed in July 1993,     treated with lumpectomy and radiation treatments.  The patient     developed recurrent ductal carcinoma in situ from a biopsy carried     out on 08/03/2005 and underwent a right-sided mastectomy on     08/24/2005. 2. History of GIST involving the stomach     status post partial gastrectomy on 03/24/1995 without evidence of     recurrence. 3. History of osteoporosis. 4. History of depression and anxiety. 5. 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 6. Right middle ear and mastoid cholesteatoma status post surgery on     06/18/2011 at [facility]. 7. Diabetes mellitus type 2. 8. Hypertension. 9. Dyslipidemia. 10.Irritable bowel syndrome. 11.Family history of colon cancer involving the patient's brother.  Surgical history, Social history, and Family History were reviewed and updated.  Review of Systems:  Constitutional:  Negative for fever, chills, night sweats, anorexia, weight loss, pain. +Insomnia Cardiovascular: no chest pain or dyspnea on exertion Respiratory: no cough, shortness of breath, or wheezing Neurological: no TIA or stroke symptoms Dermatological: positive  for seberottic  ENT: negative for - headaches or visual changes Skin: Negative. Gastrointestinal: no abdominal pain, change in bowel habits, or black or bloody stools Genito-Urinary: no dysuria, trouble voiding, or hematuria Hematological and Lymphatic: negative for - bleeding problems or blood clots Breast: negative for breast lumps Musculoskeletal: negative for - joint pain Remaining ROS negative.  Physical Exam: Blood pressure  (!) 158/96, pulse (!) 40, temperature 98.1 F (36.7 C), temperature source Oral, resp. rate 18, height 5' 7.5" (1.715 m), weight 155 lb 3.2 oz (70.4 kg), SpO2 100 %.   ECOG: 1 General appearance: alert and no distress Head: Normocephalic, without obvious abnormality, atraumatic Neck: no adenopathy, supple, symmetrical, trachea midline and thyroid not enlarged, symmetric, no tenderness/mass/nodules Lymph nodes: Cervical, supraclavicular, and axillary nodes normal. Breast: Right breast is surgically absent via a right-sided mastectomy without evidence of chest wall recurrence.  Left breast is benign.  No axillary adenopathy. Heart:regular rate and rhythm, S1, S2 normal, no murmur, click, rub or gallop (+) premature irregular heart beat Lung:chest clear, no wheezing, rales, normal symmetric air entry, Heart exam - S1, S2 normal, no murmur, no gallop, rate regular Abdomin: soft, non-tender, without masses or organomegaly EXT:No edema Skin: A cafe au lait spot was seen in the left lateral chest region.   Lab Results: CBC Latest Ref Rng & Units 11/05/2016 10/27/2015 10/26/2014  WBC 3.9 - 10.3 10e3/uL 8.6 7.4 8.3  Hemoglobin 11.6 - 15.9 g/dL 15.9 14.5 14.4  Hematocrit 34.8 - 46.6 % 48.9(H) 45.8 45.3  Platelets 145 - 400 10e3/uL 281 268 297    CMP Latest Ref Rng & Units 11/05/2016 10/27/2015 10/26/2014  Glucose 70 - 140 mg/dl 127 108 164(H)  BUN 7.0 - 26.0 mg/dL 17.9 16.0 14.3  Creatinine 0.6 - 1.1 mg/dL 0.9 0.9 0.8  Sodium 136 - 145 mEq/L 142 142 140  Potassium 3.5 - 5.1 mEq/L 4.1 4.6 4.2  Chloride 96 - 112 mEq/L - - -  CO2 22 - 29 mEq/L 25 26 24   Calcium 8.4 - 10.4 mg/dL 10.3 9.6 9.4  Total Protein 6.4 - 8.3 g/dL 7.6 6.7 6.7  Total Bilirubin 0.20 - 1.20 mg/dL 0.61 0.66 0.36  Alkaline Phos 40 - 150 U/L 55 49 55  AST 5 - 34 U/L 21 17 19   ALT 0 - 55 U/L 17 13 17    PATHOLOGY REPORT  Diagnosis Breast, left, needle core biopsy, 12:00 o'clock - FIBROCYSTIC CHANGES WITH FOCAL USUAL DUCTAL  HYPERPLASIA. - NO ATYPIA OR MALIGNANCY IDENTIFIED. - SEE COMMENT. Microscopic Comment As sampled, the findings are best diagnosed as above. Please correlate with clinical and radiologic impression. The findings are called to the [facility] on 04/28/2015. Dr. Lyndon Code has seen this case in consultation with agreement. (RH:kh 04/28/15)   Radiological Studies:  MM Screening Breast TOMO Left 10/10/16 IMPRESSION: No mammographic evidence of malignancy. A result letter of this screening mammogram will be mailed directly to the patient. RECOMMENDATION: Screening mammogram in one year.    LEFT DIGITAL DIAGNOSTIC MAMMOGRAM AND [place] 04/25/2015 IMPRESSION: FINDINGS: Mammographic images were obtained following ultrasound guided biopsy of mass in the 12 o'clock location of the left breast. A ribbon shaped clip is identified in the upper central portion of the left breast as expected. IMPRESSION: Tissue marker clip is in expected location following biopsy  Assessment and Plan: 83 year old African-American female  1. History of recurrent right breast DCIS -DCIS right breast (1993) treated with lumpectomy and radiation treatments.  She  developed recurrent DCIS  in 08/03/2005 and  underwent a right-sided mastectomy on 08/24/2005.  -She underwent a left breast biopsy in March 2017, which was negative. She is scheduled to have a repeated mammogram next week.  --Although she is 54, she is fairly in good health, we'll continue annual screening left breast mammogram.  -I previously encouraged her to continue healthy diet and regular exercise. She is physically active -She is clinically doing very well, physical exam was unremarkable, today's lab reviewed, all within normal limits, no evidence of recurrence. -F/u in 1 year, mammogram in 09/2017  2. History of gastric GIST  -s/p partial gastrectomy on 03/24/1995  -She is clinically doing well, no evidence of recurrence -No need for routine  scan surveillance, or follow up clinically.  3. Irregular heart beat/low heart rate -HR of 40 in clinic today and repeated at 60. Had irregular heart beat, likely premature heat beat during physical exam (11/05/16) -had EKG this year with [facility] Cindy Wong, she denies any chest discomfort  -Will F/u with her PCP  Follow up -Will copy note to Cindy Wong her PCP -I'll see her back in one year with labs -Mammogram in 09/2017  I spent 20 minutes with her visit today more than 50% time on face-to-face counseling.  This document serves as a record of services personally performed by Truitt Merle, MD. It was created on her behalf by Joslyn Devon, a trained medical scribe. The creation of this record is based on the scribe's personal observations and the provider's statements to them. This document has been checked and approved by the attending provider.   Truitt Merle 11/05/2016

## 2016-11-05 ENCOUNTER — Other Ambulatory Visit (HOSPITAL_BASED_OUTPATIENT_CLINIC_OR_DEPARTMENT_OTHER): Payer: Medicare Other

## 2016-11-05 ENCOUNTER — Telehealth: Payer: Self-pay | Admitting: Hematology

## 2016-11-05 ENCOUNTER — Ambulatory Visit (HOSPITAL_BASED_OUTPATIENT_CLINIC_OR_DEPARTMENT_OTHER): Payer: Medicare Other | Admitting: Hematology

## 2016-11-05 VITALS — BP 158/96 | HR 40 | Temp 98.1°F | Resp 18 | Ht 67.5 in | Wt 155.2 lb

## 2016-11-05 DIAGNOSIS — Z8509 Personal history of malignant neoplasm of other digestive organs: Secondary | ICD-10-CM | POA: Diagnosis not present

## 2016-11-05 DIAGNOSIS — Z86000 Personal history of in-situ neoplasm of breast: Secondary | ICD-10-CM | POA: Diagnosis not present

## 2016-11-05 DIAGNOSIS — Z1231 Encounter for screening mammogram for malignant neoplasm of breast: Secondary | ICD-10-CM

## 2016-11-05 DIAGNOSIS — C49A2 Gastrointestinal stromal tumor of stomach: Secondary | ICD-10-CM

## 2016-11-05 LAB — CBC WITH DIFFERENTIAL/PLATELET
BASO%: 1.4 % (ref 0.0–2.0)
Basophils Absolute: 0.1 10*3/uL (ref 0.0–0.1)
EOS%: 1.5 % (ref 0.0–7.0)
Eosinophils Absolute: 0.1 10*3/uL (ref 0.0–0.5)
HCT: 48.9 % — ABNORMAL HIGH (ref 34.8–46.6)
HGB: 15.9 g/dL (ref 11.6–15.9)
LYMPH%: 28.1 % (ref 14.0–49.7)
MCH: 26.9 pg (ref 25.1–34.0)
MCHC: 32.4 g/dL (ref 31.5–36.0)
MCV: 82.9 fL (ref 79.5–101.0)
MONO#: 0.5 10*3/uL (ref 0.1–0.9)
MONO%: 6.1 % (ref 0.0–14.0)
NEUT#: 5.4 10*3/uL (ref 1.5–6.5)
NEUT%: 62.9 % (ref 38.4–76.8)
Platelets: 281 10*3/uL (ref 145–400)
RBC: 5.91 10*6/uL — ABNORMAL HIGH (ref 3.70–5.45)
RDW: 13.8 % (ref 11.2–14.5)
WBC: 8.6 10*3/uL (ref 3.9–10.3)
lymph#: 2.4 10*3/uL (ref 0.9–3.3)

## 2016-11-05 LAB — COMPREHENSIVE METABOLIC PANEL
ALT: 17 U/L (ref 0–55)
AST: 21 U/L (ref 5–34)
Albumin: 4.1 g/dL (ref 3.5–5.0)
Alkaline Phosphatase: 55 U/L (ref 40–150)
Anion Gap: 10 mEq/L (ref 3–11)
BUN: 17.9 mg/dL (ref 7.0–26.0)
CO2: 25 mEq/L (ref 22–29)
Calcium: 10.3 mg/dL (ref 8.4–10.4)
Chloride: 107 mEq/L (ref 98–109)
Creatinine: 0.9 mg/dL (ref 0.6–1.1)
EGFR: 66 mL/min/{1.73_m2} — ABNORMAL LOW (ref >90)
Glucose: 127 mg/dl (ref 70–140)
Potassium: 4.1 mEq/L (ref 3.5–5.1)
Sodium: 142 mEq/L (ref 136–145)
Total Bilirubin: 0.61 mg/dL (ref 0.20–1.20)
Total Protein: 7.6 g/dL (ref 6.4–8.3)

## 2016-11-05 NOTE — Telephone Encounter (Signed)
Gave patient avs report and appointments for September 2019 and mammo for August 2019.

## 2016-11-06 ENCOUNTER — Encounter: Payer: Self-pay | Admitting: Hematology

## 2016-11-08 DIAGNOSIS — Z6824 Body mass index (BMI) 24.0-24.9, adult: Secondary | ICD-10-CM | POA: Diagnosis not present

## 2016-11-08 DIAGNOSIS — R001 Bradycardia, unspecified: Secondary | ICD-10-CM | POA: Diagnosis not present

## 2016-11-28 ENCOUNTER — Other Ambulatory Visit: Payer: Self-pay

## 2016-11-28 ENCOUNTER — Emergency Department (HOSPITAL_COMMUNITY): Payer: Medicare Other

## 2016-11-28 ENCOUNTER — Encounter (HOSPITAL_COMMUNITY): Payer: Self-pay | Admitting: Emergency Medicine

## 2016-11-28 ENCOUNTER — Emergency Department (HOSPITAL_COMMUNITY)
Admission: EM | Admit: 2016-11-28 | Discharge: 2016-11-29 | Disposition: A | Discharge status: Home / Self Care | Payer: Medicare Other | Attending: Emergency Medicine | Admitting: Emergency Medicine

## 2016-11-28 DIAGNOSIS — Z853 Personal history of malignant neoplasm of breast: Secondary | ICD-10-CM | POA: Insufficient documentation

## 2016-11-28 DIAGNOSIS — I10 Essential (primary) hypertension: Secondary | ICD-10-CM | POA: Diagnosis not present

## 2016-11-28 DIAGNOSIS — R51 Headache: Secondary | ICD-10-CM | POA: Diagnosis not present

## 2016-11-28 DIAGNOSIS — R42 Dizziness and giddiness: Secondary | ICD-10-CM | POA: Diagnosis not present

## 2016-11-28 DIAGNOSIS — Z7984 Long term (current) use of oral hypoglycemic drugs: Secondary | ICD-10-CM | POA: Insufficient documentation

## 2016-11-28 DIAGNOSIS — S060X0A Concussion without loss of consciousness, initial encounter: Secondary | ICD-10-CM

## 2016-11-28 DIAGNOSIS — Y939 Activity, unspecified: Secondary | ICD-10-CM | POA: Insufficient documentation

## 2016-11-28 DIAGNOSIS — Y999 Unspecified external cause status: Secondary | ICD-10-CM | POA: Insufficient documentation

## 2016-11-28 DIAGNOSIS — Y92410 Unspecified street and highway as the place of occurrence of the external cause: Secondary | ICD-10-CM | POA: Insufficient documentation

## 2016-11-28 DIAGNOSIS — E119 Type 2 diabetes mellitus without complications: Secondary | ICD-10-CM | POA: Insufficient documentation

## 2016-11-28 DIAGNOSIS — S0990XA Unspecified injury of head, initial encounter: Secondary | ICD-10-CM | POA: Diagnosis present

## 2016-11-28 DIAGNOSIS — S299XXA Unspecified injury of thorax, initial encounter: Secondary | ICD-10-CM | POA: Diagnosis not present

## 2016-11-28 LAB — CBC WITH DIFFERENTIAL/PLATELET
Basophils Absolute: 0.1 10*3/uL (ref 0.0–0.1)
Basophils Relative: 1 %
Eosinophils Absolute: 0.2 10*3/uL (ref 0.0–0.7)
Eosinophils Relative: 2 %
HCT: 48.7 % — ABNORMAL HIGH (ref 36.0–46.0)
Hemoglobin: 15.5 g/dL — ABNORMAL HIGH (ref 12.0–15.0)
Lymphocytes Relative: 37 %
Lymphs Abs: 3.6 10*3/uL (ref 0.7–4.0)
MCH: 26.7 pg (ref 26.0–34.0)
MCHC: 31.8 g/dL (ref 30.0–36.0)
MCV: 84 fL (ref 78.0–100.0)
Monocytes Absolute: 0.6 10*3/uL (ref 0.1–1.0)
Monocytes Relative: 6 %
Neutro Abs: 5.3 10*3/uL (ref 1.7–7.7)
Neutrophils Relative %: 54 %
Platelets: 279 10*3/uL (ref 150–400)
RBC: 5.8 MIL/uL — ABNORMAL HIGH (ref 3.87–5.11)
RDW: 13.8 % (ref 11.5–15.5)
WBC: 9.7 10*3/uL (ref 4.0–10.5)

## 2016-11-28 LAB — COMPREHENSIVE METABOLIC PANEL
ALT: 19 U/L (ref 14–54)
AST: 23 U/L (ref 15–41)
Albumin: 4.1 g/dL (ref 3.5–5.0)
Alkaline Phosphatase: 47 U/L (ref 38–126)
Anion gap: 8 (ref 5–15)
BUN: 14 mg/dL (ref 6–20)
CO2: 25 mmol/L (ref 22–32)
Calcium: 9.7 mg/dL (ref 8.9–10.3)
Chloride: 108 mmol/L (ref 101–111)
Creatinine, Ser: 0.7 mg/dL (ref 0.44–1.00)
GFR calc Af Amer: >60 mL/min (ref >60)
GFR calc non Af Amer: >60 mL/min (ref >60)
Glucose, Bld: 72 mg/dL (ref 65–99)
Potassium: 4.3 mmol/L (ref 3.5–5.1)
Sodium: 141 mmol/L (ref 135–145)
Total Bilirubin: 0.6 mg/dL (ref 0.3–1.2)
Total Protein: 7.3 g/dL (ref 6.5–8.1)

## 2016-11-28 LAB — TROPONIN I: Troponin I: <0.03 ng/mL (ref <0.03)

## 2016-11-28 MED ORDER — METOCLOPRAMIDE HCL 5 MG/ML IJ SOLN
10.0000 mg | Freq: Once | INTRAMUSCULAR | Status: AC
  Administered 2016-11-28: 10 mg via INTRAVENOUS
  Filled 2016-11-28: qty 2

## 2016-11-28 MED ORDER — AMLODIPINE BESYLATE 5 MG PO TABS
5.0000 mg | ORAL_TABLET | Freq: Once | ORAL | Status: AC
  Administered 2016-11-28: 5 mg via ORAL
  Filled 2016-11-28: qty 1

## 2016-11-28 MED ORDER — LISINOPRIL 20 MG PO TABS
20.0000 mg | ORAL_TABLET | Freq: Once | ORAL | Status: AC
  Administered 2016-11-28: 20 mg via ORAL
  Filled 2016-11-28: qty 1

## 2016-11-28 MED ORDER — SODIUM CHLORIDE 0.9 % IV BOLUS (SEPSIS)
1000.0000 mL | Freq: Once | INTRAVENOUS | Status: AC
  Administered 2016-11-28: 1000 mL via INTRAVENOUS

## 2016-11-28 MED ORDER — HYDRALAZINE HCL 20 MG/ML IJ SOLN
10.0000 mg | Freq: Once | INTRAMUSCULAR | Status: AC
  Administered 2016-11-28: 10 mg via INTRAVENOUS
  Filled 2016-11-28: qty 1

## 2016-11-28 MED ORDER — METOCLOPRAMIDE HCL 10 MG PO TABS: 10.0000 mg | ORAL_TABLET | Freq: Four times a day (QID) | ORAL | 0 refills | Status: DC | PRN

## 2016-11-28 MED ORDER — DIPHENHYDRAMINE HCL 50 MG/ML IJ SOLN
12.5000 mg | Freq: Once | INTRAMUSCULAR | Status: AC
  Administered 2016-11-28: 12.5 mg via INTRAVENOUS
  Filled 2016-11-28: qty 1
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
## 2016-11-28 NOTE — Discharge Instructions (Signed)
Continue taking your blood pressure medicines as prescribed.   Take tylenol, motrin as needed for headaches.   Take reglan for nausea or headaches.   See your doctor in a week for follow up and recheck blood pressure  Return to ER if you have worse headaches, dizziness, vomiting, passing out, chest pain, abdominal pain

## 2016-12-05 DIAGNOSIS — F418 Other specified anxiety disorders: Secondary | ICD-10-CM | POA: Diagnosis not present

## 2016-12-05 DIAGNOSIS — Z6824 Body mass index (BMI) 24.0-24.9, adult: Secondary | ICD-10-CM | POA: Diagnosis not present

## 2016-12-14 ENCOUNTER — Encounter: Payer: Self-pay | Admitting: Gynecology

## 2016-12-14 ENCOUNTER — Ambulatory Visit (INDEPENDENT_AMBULATORY_CARE_PROVIDER_SITE_OTHER): Payer: Medicare Other | Admitting: Gynecology

## 2016-12-14 VITALS — BP 144/84 | Ht 68.0 in | Wt 155.0 lb

## 2016-12-14 DIAGNOSIS — Z86000 Personal history of in-situ neoplasm of breast: Secondary | ICD-10-CM

## 2016-12-14 DIAGNOSIS — N952 Postmenopausal atrophic vaginitis: Secondary | ICD-10-CM

## 2016-12-14 DIAGNOSIS — Z01411 Encounter for gynecological examination (general) (routine) with abnormal findings: Secondary | ICD-10-CM

## 2016-12-14 DIAGNOSIS — C50911 Malignant neoplasm of unspecified site of right female breast: Secondary | ICD-10-CM

## 2016-12-14 NOTE — Patient Instructions (Signed)
Follow-up in 1 year for annual exam, sooner as needed. 

## 2016-12-14 NOTE — Progress Notes (Signed)
    Cindy Wong Sep 12, 1931 [id number]        83 y.o.  G1P0100 for breast and pelvic exam  Past medical history,surgical history, problem list, medications, allergies, family history and social history were all reviewed and documented as reviewed in the EPIC chart.  ROS:  Performed with pertinent positives and negatives included in the history, assessment and plan.   Additional significant findings : None   Exam: Cindy Wong assistant Vitals:   12/14/16 1121  BP: (!) 144/84  Weight: 155 lb (70.3 kg)  Height: 5\' 8"  (1.727 m)   Body mass index is 23.57 kg/m.  General appearance:  Normal affect, orientation and appearance. Skin: Grossly normal HEENT: Without gross lesions.  No cervical or supraclavicular adenopathy. Thyroid normal.  Lungs:  Clear without wheezing, rales or rhonchi Cardiac: RR, without RMG Abdominal:  Soft, nontender, without masses, guarding, rebound, organomegaly or hernia Breasts:  Examined lying and sitting.  Left without masses, retractions, discharge or axillary adenopathy.  Right status post mastectomy.  Chest wall without masses or axillary adenopathy Pelvic:  Ext, BUS, Vagina: With atrophic changes  Cervix: Flush with upper vagina  Adnexa: Without masses or tenderness    Anus and perineum: Normal   Rectovaginal: Normal sphincter tone without palpated masses or tenderness.    Assessment/Plan:  83 y.o. G25P0100 female for breast and pelvic exam.   1. Postmenopausal/atrophic genital changes.  Status post supracervical hysterectomy for leiomyoma.  Doing well without significant hot flushes, night sweats or vaginal dryness.  No vaginal bleeding. 2. History of right breast cancer status post mastectomy.  Exam NED.  Mammography 09/2016.  Continue with annual mammography when due. 3. DEXA 2013 normal.  Had used Fosamax in the past.  Is planning follow-up DEXA in January per Dr Osborne Casco.  She will continue to follow-up with him in reference to bone  health. 4. Colonoscopy 2013.  She reports that they recommend no further screening. 5. Pap smear 2010.  No Pap smear done today.  No history of abnormal Pap smears.  Per current screening guidelines we both agree to stop screening based on age. 6. Health maintenance.  Mild elevated blood pressure noted to the patient.  Will follow-up for recheck in non-exam situation.  Follow-up with primary who follows her for hypertension if remains elevated.  No routine lab work done as patient does use elsewhere.  Follow-up 1 year, sooner as needed.      Anastasio Auerbach MD, 12:06 PM 12/14/2016

## 2016-12-20 DIAGNOSIS — Z9889 Other specified postprocedural states: Secondary | ICD-10-CM | POA: Diagnosis not present

## 2016-12-20 DIAGNOSIS — S060X0S Concussion without loss of consciousness, sequela: Secondary | ICD-10-CM | POA: Diagnosis not present

## 2016-12-20 DIAGNOSIS — H906 Mixed conductive and sensorineural hearing loss, bilateral: Secondary | ICD-10-CM | POA: Diagnosis not present

## 2016-12-20 DIAGNOSIS — M542 Cervicalgia: Secondary | ICD-10-CM | POA: Diagnosis not present

## 2016-12-20 DIAGNOSIS — R51 Headache: Secondary | ICD-10-CM | POA: Diagnosis not present

## 2017-01-14 DIAGNOSIS — I10 Essential (primary) hypertension: Secondary | ICD-10-CM | POA: Diagnosis not present

## 2017-01-14 DIAGNOSIS — Z6823 Body mass index (BMI) 23.0-23.9, adult: Secondary | ICD-10-CM | POA: Diagnosis not present

## 2017-01-14 DIAGNOSIS — R51 Headache: Secondary | ICD-10-CM | POA: Diagnosis not present

## 2017-01-22 ENCOUNTER — Ambulatory Visit (INDEPENDENT_AMBULATORY_CARE_PROVIDER_SITE_OTHER): Payer: Medicare Other | Admitting: Podiatry

## 2017-01-22 ENCOUNTER — Encounter: Payer: Self-pay | Admitting: Podiatry

## 2017-01-22 DIAGNOSIS — M79676 Pain in unspecified toe(s): Secondary | ICD-10-CM | POA: Diagnosis not present

## 2017-01-22 DIAGNOSIS — L84 Corns and callosities: Secondary | ICD-10-CM

## 2017-01-22 DIAGNOSIS — B351 Tinea unguium: Secondary | ICD-10-CM | POA: Diagnosis not present

## 2017-01-22 NOTE — Progress Notes (Addendum)
Patient ID: Cindy Wong, female   DOB: 09-Sep-1931, 83 y.o.   MRN: [id number] Complaint:  Visit Type: Patient returns to my office for continued preventative foot care services. Complaint: Patient states" my nails have grown long and thick and become painful to walk and wear shoes" Patient has been diagnosed with DM with no foot complications. The patient presents for preventative foot care services. No changes to ROS  Podiatric Exam: Vascular: dorsalis pedis and posterior tibial pulses are palpable bilateral. Capillary return is immediate. Temperature gradient is WNL. Skin turgor WNL  Sensorium: Normal Semmes Weinstein monofilament test. Normal tactile sensation bilaterally. Nail Exam: Pt has thick disfigured discolored nails with subungual debris noted bilateral entire nail hallux through fifth toenails Ulcer Exam: There is no evidence of ulcer or pre-ulcerative changes or infection. Orthopedic Exam: Muscle tone and strength are WNL. No limitations in general ROM. No crepitus or effusions noted. Foot type and digits show no abnormalities. Bony prominences are unremarkable. Skin: No Porokeratosis. No infection or ulcers.  Heloma Durum fifth toe left foot asymptomatic.  Diagnosis:  Onychomycosis, , Pain in right toe, pain in left toes,  Corn 5th toe left foot.  Treatment & Plan Procedures and Treatment: Consent by patient was obtained for treatment procedures. The patient understood the discussion of treatment and procedures well. All questions were answered thoroughly reviewed. Debridement of mycotic and hypertrophic toenails, 1 through 5 bilateral and clearing of subungual debris. No ulceration, no infection noted. ABN signed for 2018. Return Visit-Office Procedure: Patient instructed to return to the office for a follow up visit 3 months for continued evaluation and treatment.   Gardiner Barefoot DPM

## 2017-01-31 DIAGNOSIS — H524 Presbyopia: Secondary | ICD-10-CM | POA: Diagnosis not present

## 2017-01-31 DIAGNOSIS — H2513 Age-related nuclear cataract, bilateral: Secondary | ICD-10-CM | POA: Diagnosis not present

## 2017-01-31 DIAGNOSIS — E119 Type 2 diabetes mellitus without complications: Secondary | ICD-10-CM | POA: Diagnosis not present

## 2017-01-31 DIAGNOSIS — H25013 Cortical age-related cataract, bilateral: Secondary | ICD-10-CM | POA: Diagnosis not present

## 2017-02-01 DIAGNOSIS — Z6823 Body mass index (BMI) 23.0-23.9, adult: Secondary | ICD-10-CM | POA: Diagnosis not present

## 2017-02-01 DIAGNOSIS — I10 Essential (primary) hypertension: Secondary | ICD-10-CM | POA: Diagnosis not present

## 2017-02-01 DIAGNOSIS — F411 Generalized anxiety disorder: Secondary | ICD-10-CM | POA: Diagnosis not present

## 2017-02-20 DIAGNOSIS — E7800 Pure hypercholesterolemia, unspecified: Secondary | ICD-10-CM | POA: Diagnosis not present

## 2017-02-20 DIAGNOSIS — E119 Type 2 diabetes mellitus without complications: Secondary | ICD-10-CM | POA: Diagnosis not present

## 2017-02-20 DIAGNOSIS — E559 Vitamin D deficiency, unspecified: Secondary | ICD-10-CM | POA: Diagnosis not present

## 2017-02-20 DIAGNOSIS — Z853 Personal history of malignant neoplasm of breast: Secondary | ICD-10-CM | POA: Diagnosis not present

## 2017-02-20 DIAGNOSIS — Z6823 Body mass index (BMI) 23.0-23.9, adult: Secondary | ICD-10-CM | POA: Diagnosis not present

## 2017-02-20 DIAGNOSIS — I10 Essential (primary) hypertension: Secondary | ICD-10-CM | POA: Diagnosis not present

## 2017-02-20 DIAGNOSIS — M859 Disorder of bone density and structure, unspecified: Secondary | ICD-10-CM | POA: Diagnosis not present

## 2017-02-20 DIAGNOSIS — F411 Generalized anxiety disorder: Secondary | ICD-10-CM | POA: Diagnosis not present

## 2017-04-23 ENCOUNTER — Encounter: Payer: Self-pay | Admitting: Podiatry

## 2017-04-23 ENCOUNTER — Ambulatory Visit (INDEPENDENT_AMBULATORY_CARE_PROVIDER_SITE_OTHER): Payer: Medicare Other | Admitting: Podiatry

## 2017-04-23 DIAGNOSIS — B351 Tinea unguium: Secondary | ICD-10-CM

## 2017-04-23 DIAGNOSIS — L84 Corns and callosities: Secondary | ICD-10-CM

## 2017-04-23 DIAGNOSIS — M79676 Pain in unspecified toe(s): Secondary | ICD-10-CM | POA: Diagnosis not present

## 2017-04-23 DIAGNOSIS — E1151 Type 2 diabetes mellitus with diabetic peripheral angiopathy without gangrene: Secondary | ICD-10-CM | POA: Diagnosis not present

## 2017-04-23 NOTE — Progress Notes (Signed)
Patient ID: Cindy Wong, female   DOB: 05/04/1931, 84 y.o.   MRN: [id number] Complaint:  Visit Type: Patient returns to my office for continued preventative foot care services. Complaint: Patient states" my nails have grown long and thick and become painful to walk and wear shoes" Patient has been diagnosed with DM with no foot complications. The patient presents for preventative foot care services. No changes to ROS  Podiatric Exam: Vascular: dorsalis pedis and posterior tibial pulses are palpable bilateral. Capillary return is immediate. Temperature gradient is WNL. Skin turgor WNL  Sensorium: Normal Semmes Weinstein monofilament test. Normal tactile sensation bilaterally. Nail Exam: Pt has thick disfigured discolored nails with subungual debris noted bilateral entire nail hallux through fifth toenails Ulcer Exam: There is no evidence of ulcer or pre-ulcerative changes or infection. Orthopedic Exam: Muscle tone and strength are WNL. No limitations in general ROM. No crepitus or effusions noted. Foot type and digits show no abnormalities. Bony prominences are unremarkable. Skin: No Porokeratosis. No infection or ulcers.  Heloma Durum fifth toe left foot asymptomatic.  Diagnosis:  Onychomycosis, , Pain in right toe, pain in left toes,  Corn 5th toe left foot.  Treatment & Plan Procedures and Treatment: Consent by patient was obtained for treatment procedures. The patient understood the discussion of treatment and procedures well. All questions were answered thoroughly reviewed. Debridement of mycotic and hypertrophic toenails, 1 through 5 bilateral and clearing of subungual debris. No ulceration, no infection noted. ABN signed for 2019. Return Visit-Office Procedure: Patient instructed to return to the office for a follow up visit 3 months for continued evaluation and treatment.   Gardiner Barefoot DPM

## 2017-04-26 DIAGNOSIS — I10 Essential (primary) hypertension: Secondary | ICD-10-CM | POA: Diagnosis not present

## 2017-04-26 DIAGNOSIS — R55 Syncope and collapse: Secondary | ICD-10-CM | POA: Diagnosis not present

## 2017-04-26 DIAGNOSIS — Z6823 Body mass index (BMI) 23.0-23.9, adult: Secondary | ICD-10-CM | POA: Diagnosis not present

## 2017-04-29 ENCOUNTER — Other Ambulatory Visit: Payer: Self-pay | Admitting: Family Medicine

## 2017-04-29 DIAGNOSIS — R55 Syncope and collapse: Secondary | ICD-10-CM

## 2017-05-04 ENCOUNTER — Ambulatory Visit
Admission: RE | Admit: 2017-05-04 | Discharge: 2017-05-04 | Disposition: A | Discharge status: Home / Self Care | Payer: Medicare Other | Source: Ambulatory Visit | Attending: Family Medicine | Admitting: Family Medicine

## 2017-05-04 DIAGNOSIS — R55 Syncope and collapse: Secondary | ICD-10-CM | POA: Diagnosis not present

## 2017-05-04 MED ORDER — GADOBENATE DIMEGLUMINE 529 MG/ML IV SOLN
14.0000 mL | Freq: Once | INTRAVENOUS | Status: AC | PRN
  Administered 2017-05-04: 14 mL via INTRAVENOUS

## 2017-07-04 DIAGNOSIS — L821 Other seborrheic keratosis: Secondary | ICD-10-CM | POA: Diagnosis not present

## 2017-07-04 DIAGNOSIS — D1801 Hemangioma of skin and subcutaneous tissue: Secondary | ICD-10-CM | POA: Diagnosis not present

## 2017-07-23 ENCOUNTER — Encounter: Payer: Self-pay | Admitting: Podiatry

## 2017-07-23 ENCOUNTER — Ambulatory Visit (INDEPENDENT_AMBULATORY_CARE_PROVIDER_SITE_OTHER): Payer: Medicare Other | Admitting: Podiatry

## 2017-07-23 DIAGNOSIS — M79676 Pain in unspecified toe(s): Secondary | ICD-10-CM | POA: Diagnosis not present

## 2017-07-23 DIAGNOSIS — B351 Tinea unguium: Secondary | ICD-10-CM | POA: Diagnosis not present

## 2017-07-23 DIAGNOSIS — E1151 Type 2 diabetes mellitus with diabetic peripheral angiopathy without gangrene: Secondary | ICD-10-CM

## 2017-07-23 NOTE — Progress Notes (Signed)
Patient ID: Cindy Wong, female   DOB: 10/19/1931, 84 y.o.   MRN: [id number] Complaint:  Visit Type: Patient returns to my office for continued preventative foot care services. Complaint: Patient states" my nails have grown long and thick and become painful to walk and wear shoes" Patient has been diagnosed with DM with no foot complications. The patient presents for preventative foot care services. No changes to ROS  Podiatric Exam: Vascular: dorsalis pedis and posterior tibial pulses are palpable bilateral. Capillary return is immediate. Temperature gradient is WNL. Skin turgor WNL  Sensorium: Normal Semmes Weinstein monofilament test. Normal tactile sensation bilaterally. Nail Exam: Pt has thick disfigured discolored nails with subungual debris noted bilateral entire nail hallux through fifth toenails Ulcer Exam: There is no evidence of ulcer or pre-ulcerative changes or infection. Orthopedic Exam: Muscle tone and strength are WNL. No limitations in general ROM. No crepitus or effusions noted. Foot type and digits show no abnormalities. Bony prominences are unremarkable. Skin: No Porokeratosis. No infection or ulcers.  Heloma Durum fifth toe left foot asymptomatic.  Diagnosis:  Onychomycosis, , Pain in right toe, pain in left toes,    Treatment & Plan Procedures and Treatment: Consent by patient was obtained for treatment procedures. The patient understood the discussion of treatment and procedures well. All questions were answered thoroughly reviewed. Debridement of mycotic and hypertrophic toenails, 1 through 5 bilateral and clearing of subungual debris. No ulceration, no infection noted. ABN signed for 2019. Return Visit-Office Procedure: Patient instructed to return to the office for a follow up visit 3 months for continued evaluation and treatment.   Lovell Nuttall DPM 

## 2017-08-21 DIAGNOSIS — E7800 Pure hypercholesterolemia, unspecified: Secondary | ICD-10-CM | POA: Diagnosis not present

## 2017-08-21 DIAGNOSIS — R82998 Other abnormal findings in urine: Secondary | ICD-10-CM | POA: Diagnosis not present

## 2017-08-21 DIAGNOSIS — I10 Essential (primary) hypertension: Secondary | ICD-10-CM | POA: Diagnosis not present

## 2017-08-21 DIAGNOSIS — E559 Vitamin D deficiency, unspecified: Secondary | ICD-10-CM | POA: Diagnosis not present

## 2017-08-21 DIAGNOSIS — E119 Type 2 diabetes mellitus without complications: Secondary | ICD-10-CM | POA: Diagnosis not present

## 2017-08-28 DIAGNOSIS — E7800 Pure hypercholesterolemia, unspecified: Secondary | ICD-10-CM | POA: Diagnosis not present

## 2017-08-28 DIAGNOSIS — M859 Disorder of bone density and structure, unspecified: Secondary | ICD-10-CM | POA: Diagnosis not present

## 2017-08-28 DIAGNOSIS — Z853 Personal history of malignant neoplasm of breast: Secondary | ICD-10-CM | POA: Diagnosis not present

## 2017-08-28 DIAGNOSIS — Z0000 Encounter for general adult medical examination without abnormal findings: Secondary | ICD-10-CM | POA: Diagnosis not present

## 2017-08-28 DIAGNOSIS — I10 Essential (primary) hypertension: Secondary | ICD-10-CM | POA: Diagnosis not present

## 2017-08-28 DIAGNOSIS — Z1389 Encounter for screening for other disorder: Secondary | ICD-10-CM | POA: Diagnosis not present

## 2017-08-28 DIAGNOSIS — E559 Vitamin D deficiency, unspecified: Secondary | ICD-10-CM | POA: Diagnosis not present

## 2017-08-28 DIAGNOSIS — F331 Major depressive disorder, recurrent, moderate: Secondary | ICD-10-CM | POA: Diagnosis not present

## 2017-08-28 DIAGNOSIS — Z6823 Body mass index (BMI) 23.0-23.9, adult: Secondary | ICD-10-CM | POA: Diagnosis not present

## 2017-08-28 DIAGNOSIS — E119 Type 2 diabetes mellitus without complications: Secondary | ICD-10-CM | POA: Diagnosis not present

## 2017-08-28 DIAGNOSIS — R839 Unspecified abnormal finding in cerebrospinal fluid: Secondary | ICD-10-CM | POA: Diagnosis not present

## 2017-08-28 DIAGNOSIS — Z9010 Acquired absence of unspecified breast and nipple: Secondary | ICD-10-CM | POA: Diagnosis not present

## 2017-10-10 ENCOUNTER — Ambulatory Visit
Admission: RE | Admit: 2017-10-10 | Discharge: 2017-10-10 | Disposition: A | Discharge status: Home / Self Care | Payer: Medicare Other | Source: Ambulatory Visit | Attending: Hematology | Admitting: Hematology

## 2017-10-10 DIAGNOSIS — Z1231 Encounter for screening mammogram for malignant neoplasm of breast: Secondary | ICD-10-CM | POA: Diagnosis not present

## 2017-10-28 ENCOUNTER — Telehealth: Payer: Self-pay | Admitting: Hematology

## 2017-10-28 NOTE — Telephone Encounter (Signed)
[facility] - moved 9/16 appointments to 9/27. Spoke with Patient.

## 2017-10-28 NOTE — Telephone Encounter (Signed)
[facility] - moved 9/16 appointments to 10/2. Spoke with patient.

## 2017-10-29 ENCOUNTER — Encounter: Payer: Self-pay | Admitting: Podiatry

## 2017-10-29 ENCOUNTER — Ambulatory Visit (INDEPENDENT_AMBULATORY_CARE_PROVIDER_SITE_OTHER): Payer: Medicare Other | Admitting: Podiatry

## 2017-10-29 DIAGNOSIS — M79676 Pain in unspecified toe(s): Secondary | ICD-10-CM | POA: Diagnosis not present

## 2017-10-29 DIAGNOSIS — B351 Tinea unguium: Secondary | ICD-10-CM | POA: Diagnosis not present

## 2017-10-29 DIAGNOSIS — E1151 Type 2 diabetes mellitus with diabetic peripheral angiopathy without gangrene: Secondary | ICD-10-CM

## 2017-10-29 NOTE — Progress Notes (Signed)
Patient ID: Cindy Wong, female   DOB: [birth year], 84 y.o.   MRN: [id number] Complaint:  Visit Type: Patient returns to my office for continued preventative foot care services. Complaint: Patient states" my nails have grown long and thick and become painful to walk and wear shoes" Patient has been diagnosed with DM with no foot complications. The patient presents for preventative foot care services. No changes to ROS  Podiatric Exam: Vascular: dorsalis pedis and posterior tibial pulses are palpable bilateral. Capillary return is immediate. Temperature gradient is WNL. Skin turgor WNL  Sensorium: Normal Semmes Weinstein monofilament test. Normal tactile sensation bilaterally. Nail Exam: Pt has thick disfigured discolored nails with subungual debris noted bilateral entire nail hallux through fifth toenails Ulcer Exam: There is no evidence of ulcer or pre-ulcerative changes or infection. Orthopedic Exam: Muscle tone and strength are WNL. No limitations in general ROM. No crepitus or effusions noted. Foot type and digits show no abnormalities. Bony prominences are unremarkable. Skin: No Porokeratosis. No infection or ulcers.  Heloma Durum fifth toe left foot asymptomatic.  Diagnosis:  Onychomycosis, , Pain in right toe, pain in left toes,    Treatment & Plan Procedures and Treatment: Consent by patient was obtained for treatment procedures. The patient understood the discussion of treatment and procedures well. All questions were answered thoroughly reviewed. Debridement of mycotic and hypertrophic toenails, 1 through 5 bilateral and clearing of subungual debris. No ulceration, no infection noted. ABN signed for 2019. Return Visit-Office Procedure: Patient instructed to return to the office for a follow up visit 3 months for continued evaluation and treatment.   Gardiner Barefoot DPM

## 2017-11-04 ENCOUNTER — Other Ambulatory Visit: Payer: Medicare Other

## 2017-11-04 ENCOUNTER — Ambulatory Visit: Payer: Medicare Other | Admitting: Hematology

## 2017-11-15 DIAGNOSIS — F411 Generalized anxiety disorder: Secondary | ICD-10-CM | POA: Diagnosis not present

## 2017-11-15 DIAGNOSIS — Z6822 Body mass index (BMI) 22.0-22.9, adult: Secondary | ICD-10-CM | POA: Diagnosis not present

## 2017-11-15 DIAGNOSIS — Z23 Encounter for immunization: Secondary | ICD-10-CM | POA: Diagnosis not present

## 2017-11-17 NOTE — Progress Notes (Signed)
Hematology and Oncology Follow Up Visit  Cindy Wong [id number] [birth year] 84 y.o. 11/20/2017 10:11 PM Tisovec, Fransico Him, MD  Principle Diagnosises: Ductal carcinoma in situ  (DCIS) of the right breast and a history of gastrointestinal stromal tumor (GIST)  Prior Therapy:  . DCIS right breast (1993) treated with lumpectomy and radiation treatments.  She  developed recurrent DCIS in 08/03/2005 and  underwent a right-sided mastectomy on 08/24/2005.  GIST involving the stomach s/p partial gastrectomy on 03/24/1995 without evidence of recurrence.  Current therapy: Surveillance  Interim History:   Cindy Wong returns for annual follow-up. I last saw her 1 year ago. She has been following up with Podiatry. Her 2019 mammogram was benign.  Today, she is here alone at the clinic. She has not been feeling well for the past 2 months. She was isolated, depressed, and anhedonic. She now feels and eats better, and attends church for support. She doesn't sleep well. She finds it difficult to fall a sleep and maintain sleep. She watched TV at night and states that she lives alone. She tried Melatonin before, but she was not consistent. She had depression before.  She has seen her PCP for this and also for a right eyelid swelling and bulging in her eyes that she also noticed.   She denies CP or SOB, no abdominal pain.   Medications: I have reviewed the patient's current medications.  Current Outpatient Medications  Medication Sig Dispense Refill  . amLODipine (NORVASC) 5 MG tablet Take 5 mg by mouth daily.    . Cholecalciferol (VITAMIN D3) 2000 units capsule Take by mouth.    . escitalopram (LEXAPRO) 20 MG tablet TK 1 T PO QD  4  . ferrous sulfate 325 (65 FE) MG tablet Take 325 mg by mouth daily with breakfast.      . glipiZIDE (GLUCOTROL XL) 10 MG 24 hr tablet Take 10 mg by mouth daily.      . hydrochlorothiazide (HYDRODIURIL) 25 MG tablet Take 25 mg by mouth daily.  10  . Linagliptin-Metformin HCl  (JENTADUETO) 2.5-500 MG TABS Take 1 tablet by mouth 2 (two) times daily.     Marland Kitchen lisinopril (PRINIVIL,ZESTRIL) 40 MG tablet Take 40 mg by mouth daily.   0  . metoCLOPramide (REGLAN) 10 MG tablet Take 1 tablet (10 mg total) by mouth every 6 (six) hours as needed for nausea (nausea/headache). 6 tablet 0  . Multiple Vitamins-Minerals (CENTRUM SILVER PO) Take 1 tablet by mouth daily.     Glory Rosebush VERIO test strip 2 (two) times daily. for testing  2  . potassium chloride SA (K-DUR,KLOR-CON) 20 MEQ tablet Take 20 mEq by mouth daily.    . pravastatin (PRAVACHOL) 40 MG tablet Take 40 mg by mouth every evening.      No current facility-administered medications for this visit.      Allergies:  Allergies  Allergen Reactions  . Sulfonamide Derivatives Hives and Itching  . Sulfamethoxazole Rash   PMHx: 1. Intraductal carcinoma of the right breast diagnosed in July 1993,     treated with lumpectomy and radiation treatments.  The patient     developed recurrent ductal carcinoma in situ from a biopsy carried     out on 08/03/2005 and underwent a right-sided mastectomy on     08/24/2005. 2. History of GIST involving the stomach     status post partial gastrectomy on 03/24/1995 without evidence of     recurrence. 3. History of osteoporosis. 4. History of depression and  anxiety. 5. 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 6. Right middle ear and mastoid cholesteatoma status post surgery on     06/18/2011 at [facility]. 7. Diabetes mellitus type 2. 8. Hypertension. 9. Dyslipidemia. 10.Irritable bowel syndrome. 11.Family history of colon cancer involving the patient's brother.  Surgical history, Social history, and Family History were reviewed and updated.  Review of Systems:  Constitutional: Denies fevers, chills or abnormal night sweats Eyes: Denies blurriness of vision, double vision or  watery eyes (+) bulging in her eyes and right eyelid swelling Ears, nose, mouth, throat, and face: Denies mucositis or sore throat Respiratory: Denies cough, dyspnea or wheezes Cardiovascular: Denies palpitation, chest discomfort or lower extremity swelling Gastrointestinal:  Denies nausea, heartburn or change in bowel habits Skin: Denies abnormal skin rashes Lymphatics: Denies new lymphadenopathy or easy bruising Neurological:Denies numbness, tingling or new weaknesses Behavioral/Psych: (+) depression, isolation, improving now All other systems were reviewed with the patient and are negative.   Physical Exam: Blood pressure (!) 144/79, pulse 70, temperature 97.8 F (36.6 C), temperature source Oral, resp. rate 18, height 5\' 8"  (1.727 m), weight 148 lb 3.2 oz (67.2 kg), SpO2 100 %.   ECOG: 1 GENERAL:alert, no distress and comfortable SKIN: skin color, texture, turgor are normal, no rashes or significant lesions (+) multiple skin moles on her chest EYES: normal, conjunctiva are pink and non-injected, sclera clear OROPHARYNX:no exudate, no erythema and lips, buccal mucosa, and tongue normal  NECK: supple, thyroid normal size, non-tender, without nodularity LYMPH:  no palpable lymphadenopathy in the cervical, axillary or inguinal LUNGS: clear to auscultation and percussion with normal breathing effort HEART: regular rate & rhythm and no murmurs and no lower extremity edema ABDOMEN:abdomen soft, non-tender and normal bowel sounds Musculoskeletal:no cyanosis of digits and no clubbing  PSYCH: alert & oriented x 3 with fluent speech NEURO: no focal motor/sensory deficits Skin: A cafe au lait spot was seen in the left lateral chest region.   Lab Results: CBC Latest Ref Rng & Units 11/20/2017 11/28/2016 11/05/2016  WBC 3.9 - 10.3 K/uL 8.0 9.7 8.6  Hemoglobin 11.6 - 15.9 g/dL 14.6 15.5(H) 15.9  Hematocrit 34.8 - 46.6 % 46.4 48.7(H) 48.9(H)  Platelets 145 - 400 K/uL 273 279 281    CMP  Latest Ref Rng & Units 11/20/2017 11/28/2016 11/05/2016  Glucose 70 - 99 mg/dL 131(H) 72 127  BUN 8 - 23 mg/dL 18 14 17.9  Creatinine 0.44 - 1.00 mg/dL 0.89 0.70 0.9  Sodium 135 - 145 mmol/L 143 141 142  Potassium 3.5 - 5.1 mmol/L 3.8 4.3 4.1  Chloride 98 - 111 mmol/L 104 108 -  CO2 22 - 32 mmol/L 29 25 25   Calcium 8.9 - 10.3 mg/dL 10.5(H) 9.7 10.3  Total Protein 6.5 - 8.1 g/dL 7.4 7.3 7.6  Total Bilirubin 0.3 - 1.2 mg/dL 0.4 0.6 0.61  Alkaline Phos 38 - 126 U/L 53 47 55  AST 15 - 41 U/L 18 23 21   ALT 0 - 44 U/L 15 19 17    PATHOLOGY REPORT  Diagnosis Breast, left, needle core biopsy, 12:00 o'clock - FIBROCYSTIC CHANGES WITH FOCAL USUAL DUCTAL HYPERPLASIA. - NO ATYPIA OR MALIGNANCY IDENTIFIED. - SEE COMMENT. Microscopic Comment As sampled, the findings are best diagnosed as above. Please correlate with clinical and radiologic impression. The findings are called to the [facility] on 04/28/2015. Dr. Lyndon Code has seen this case in consultation with  agreement. (RH:kh 04/28/15)   Radiological Studies:  10/10/2017 Mammogram IMPRESSION: No mammographic evidence of malignancy. A result letter of this screening mammogram will be mailed directly to the patient.  MM Screening Breast TOMO Left 10/10/16 IMPRESSION: No mammographic evidence of malignancy. A result letter of this screening mammogram will be mailed directly to the patient. RECOMMENDATION: Screening mammogram in one year.    LEFT DIGITAL DIAGNOSTIC MAMMOGRAM AND [place] 04/25/2015 IMPRESSION: FINDINGS: Mammographic images were obtained following ultrasound guided biopsy of mass in the 12 o'clock location of the left breast. A ribbon shaped clip is identified in the upper central portion of the left breast as expected. IMPRESSION: Tissue marker clip is in expected location following biopsy  Assessment and Plan:  84 y.o.African-American female  1. History of recurrent right breast DCIS -DCIS right breast (1993)  treated with lumpectomy and radiation treatments.  She  developed recurrent DCIS in 08/03/2005 and  underwent a right-sided mastectomy on 08/24/2005.  -She underwent a left breast biopsy in March 2017, which was negative. Her last mammogram in 09/2017 was unremarkable --Although she is 38, she is fairly in good health, we'll continue annual screening left breast mammogram.  -I previously encouraged her to continue healthy diet and regular exercise. She is physically active -She is clinically doing very well, physical exam was unremarkable, today's lab reviewed, all within normal limits, no evidence of recurrence. -due to her recent complaint of eye bulging, I advised her to f/u with PCP with checking her thyroid function  -She still wants to f/u with me. -F/u in 1 year  2. History of gastric GIST  -s/p partial gastrectomy on 03/24/1995  -She is clinically doing well, no evidence of recurrence -No need for routine scan surveillance, will follow up clinically.  3. Irregular heart beat/low heart rate - Had irregular heart beat, likely premature heat beat during physical exam (11/05/16) -had EKG this year with [facility] Dr. Osborne Casco, she denies any chest discomfort  -Will F/u with her PCP  4. Possible depression  -she has shown some signs of depression, such as loss of interest in activities, earlier with up in the morning, less social interaction, etc.  She denies suicidal.  No recent stress -She feels better this week, she plans to go back to her church again  -I encourage her to f/u with her PCP   Follow up -I'll see her back in one year with labs  I spent 20 minutes with her visit today more than 50% time on face-to-face counseling.  Dierdre Searles Dweik am acting as scribe for Dr. Truitt Merle.  I have reviewed the above documentation for accuracy and completeness, and I agree with the above.    Truitt Merle 11/20/2017

## 2017-11-20 ENCOUNTER — Inpatient Hospital Stay: Payer: Medicare Other | Attending: Hematology

## 2017-11-20 ENCOUNTER — Inpatient Hospital Stay (HOSPITAL_BASED_OUTPATIENT_CLINIC_OR_DEPARTMENT_OTHER): Payer: Medicare Other | Admitting: Hematology

## 2017-11-20 ENCOUNTER — Encounter: Payer: Self-pay | Admitting: Hematology

## 2017-11-20 VITALS — BP 144/79 | HR 70 | Temp 97.8°F | Resp 18 | Ht 68.0 in | Wt 148.2 lb

## 2017-11-20 DIAGNOSIS — Z79899 Other long term (current) drug therapy: Secondary | ICD-10-CM | POA: Diagnosis not present

## 2017-11-20 DIAGNOSIS — E785 Hyperlipidemia, unspecified: Secondary | ICD-10-CM

## 2017-11-20 DIAGNOSIS — Z923 Personal history of irradiation: Secondary | ICD-10-CM

## 2017-11-20 DIAGNOSIS — Z800 Family history of malignant neoplasm of digestive organs: Secondary | ICD-10-CM | POA: Diagnosis not present

## 2017-11-20 DIAGNOSIS — E119 Type 2 diabetes mellitus without complications: Secondary | ICD-10-CM

## 2017-11-20 DIAGNOSIS — Z7984 Long term (current) use of oral hypoglycemic drugs: Secondary | ICD-10-CM

## 2017-11-20 DIAGNOSIS — H7121 Cholesteatoma of mastoid, right ear: Secondary | ICD-10-CM

## 2017-11-20 DIAGNOSIS — Z9011 Acquired absence of right breast and nipple: Secondary | ICD-10-CM | POA: Insufficient documentation

## 2017-11-20 DIAGNOSIS — K589 Irritable bowel syndrome without diarrhea: Secondary | ICD-10-CM | POA: Insufficient documentation

## 2017-11-20 DIAGNOSIS — C49A2 Gastrointestinal stromal tumor of stomach: Secondary | ICD-10-CM

## 2017-11-20 DIAGNOSIS — Z86000 Personal history of in-situ neoplasm of breast: Secondary | ICD-10-CM

## 2017-11-20 DIAGNOSIS — I10 Essential (primary) hypertension: Secondary | ICD-10-CM

## 2017-11-20 DIAGNOSIS — Z8509 Personal history of malignant neoplasm of other digestive organs: Secondary | ICD-10-CM | POA: Insufficient documentation

## 2017-11-20 DIAGNOSIS — F329 Major depressive disorder, single episode, unspecified: Secondary | ICD-10-CM

## 2017-11-20 DIAGNOSIS — Z903 Acquired absence of stomach [part of]: Secondary | ICD-10-CM

## 2017-11-20 DIAGNOSIS — Z1231 Encounter for screening mammogram for malignant neoplasm of breast: Secondary | ICD-10-CM

## 2017-11-20 LAB — CBC WITH DIFFERENTIAL/PLATELET
Basophils Absolute: 0.1 10*3/uL (ref 0.0–0.1)
Basophils Relative: 1 %
Eosinophils Absolute: 0.1 10*3/uL (ref 0.0–0.5)
Eosinophils Relative: 1 %
HCT: 46.4 % (ref 34.8–46.6)
Hemoglobin: 14.6 g/dL (ref 11.6–15.9)
Lymphocytes Relative: 31 %
Lymphs Abs: 2.5 10*3/uL (ref 0.9–3.3)
MCH: 26.9 pg (ref 25.1–34.0)
MCHC: 31.5 g/dL (ref 31.5–36.0)
MCV: 85.6 fL (ref 79.5–101.0)
Monocytes Absolute: 0.4 10*3/uL (ref 0.1–0.9)
Monocytes Relative: 6 %
Neutro Abs: 4.9 10*3/uL (ref 1.5–6.5)
Neutrophils Relative %: 61 %
Platelets: 273 10*3/uL (ref 145–400)
RBC: 5.42 MIL/uL (ref 3.70–5.45)
RDW: 13.4 % (ref 11.2–14.5)
WBC: 8 10*3/uL (ref 3.9–10.3)

## 2017-11-20 LAB — COMPREHENSIVE METABOLIC PANEL
ALT: 15 U/L (ref 0–44)
AST: 18 U/L (ref 15–41)
Albumin: 4 g/dL (ref 3.5–5.0)
Alkaline Phosphatase: 53 U/L (ref 38–126)
Anion gap: 10 (ref 5–15)
BUN: 18 mg/dL (ref 8–23)
CO2: 29 mmol/L (ref 22–32)
Calcium: 10.5 mg/dL — ABNORMAL HIGH (ref 8.9–10.3)
Chloride: 104 mmol/L (ref 98–111)
Creatinine, Ser: 0.89 mg/dL (ref 0.44–1.00)
GFR calc Af Amer: >60 mL/min (ref >60)
GFR calc non Af Amer: 58 mL/min — ABNORMAL LOW (ref >60)
Glucose, Bld: 131 mg/dL — ABNORMAL HIGH (ref 70–99)
Potassium: 3.8 mmol/L (ref 3.5–5.1)
Sodium: 143 mmol/L (ref 135–145)
Total Bilirubin: 0.4 mg/dL (ref 0.3–1.2)
Total Protein: 7.4 g/dL (ref 6.5–8.1)

## 2017-11-22 ENCOUNTER — Telehealth: Payer: Self-pay | Admitting: Hematology

## 2017-11-22 NOTE — Telephone Encounter (Signed)
Appts scheduled letter/calendar mailed per 10/2 los

## 2017-12-17 ENCOUNTER — Encounter: Payer: Self-pay | Admitting: Gynecology

## 2017-12-17 ENCOUNTER — Ambulatory Visit (INDEPENDENT_AMBULATORY_CARE_PROVIDER_SITE_OTHER): Payer: Medicare Other | Admitting: Gynecology

## 2017-12-17 VITALS — BP 124/78 | Ht 67.5 in | Wt 149.0 lb

## 2017-12-17 DIAGNOSIS — Z01419 Encounter for gynecological examination (general) (routine) without abnormal findings: Secondary | ICD-10-CM

## 2017-12-17 DIAGNOSIS — Z853 Personal history of malignant neoplasm of breast: Secondary | ICD-10-CM

## 2017-12-17 DIAGNOSIS — Z9289 Personal history of other medical treatment: Secondary | ICD-10-CM | POA: Diagnosis not present

## 2017-12-17 DIAGNOSIS — N952 Postmenopausal atrophic vaginitis: Secondary | ICD-10-CM

## 2017-12-17 NOTE — Patient Instructions (Signed)
Check with Dr. Osborne Casco about when your last bone density was.

## 2017-12-17 NOTE — Progress Notes (Signed)
    Cindy Wong [birth year] [id number]        84 y.o.  G1P0100 for breast and pelvic exam  Past medical history,surgical history, problem list, medications, allergies, family history and social history were all reviewed and documented as reviewed in the EPIC chart.  ROS:  Performed with pertinent positives and negatives included in the history, assessment and plan.   Additional significant findings : None   Exam: Cindy Wong assistant Vitals:   12/17/17 0855  BP: 124/78  Weight: 149 lb (67.6 kg)  Height: 5' 7.5" (1.715 m)   Body mass index is 22.99 kg/m.  General appearance:  Normal affect, orientation and appearance. Skin: Grossly normal HEENT: Without gross lesions.  No cervical or supraclavicular adenopathy. Thyroid normal.  Lungs:  Clear without wheezing, rales or rhonchi Cardiac: RR, without RMG Abdominal:  Soft, nontender, without masses, guarding, rebound, organomegaly or hernia Breasts:  Examined lying and sitting.  Left without masses, retractions, discharge or axillary adenopathy.  Right status post mastectomy.  No chest wall masses or axillary adenopathy Pelvic:  Ext, BUS, Vagina: With atrophic changes  Cervix: Flush with upper vagina  Adnexa: Without masses or tenderness    Anus and perineum: Normal   Rectovaginal: Normal sphincter tone without palpated masses or tenderness.    Assessment/Plan:  84 y.o. G75P0100 female for breast and pelvic exam.   1. Postmenopausal.  Status post supracervical hysterectomy for leiomyoma.  Without significant menopausal symptoms or any vaginal bleeding. 2. History of right breast cancer.  Exam NED.  Mammography 09/2017. 3. DEXA 2013.  Patient unsure if she has had one since.  Does them at Dr. Loren Racer office.  She is going to call his office to find out when her last bone density was and arrange for one if needed. 4. Pap smear 2010.  No Pap smear done today.  No history of significant abnormal Pap smears.  Per current screening  guidelines we both agree to stop screening based on age. 5. Colonoscopy 2011.  Reports they recommend no further colonoscopies based on age. 6. Health maintenance.  No routine blood work done as patient does this elsewhere.  Follow-up 1 year, sooner as needed.   Cindy Auerbach MD, 9:15 AM 12/17/2017

## 2018-01-20 DIAGNOSIS — M545 Low back pain: Secondary | ICD-10-CM | POA: Diagnosis not present

## 2018-01-20 DIAGNOSIS — Z6822 Body mass index (BMI) 22.0-22.9, adult: Secondary | ICD-10-CM | POA: Diagnosis not present

## 2018-01-20 DIAGNOSIS — R300 Dysuria: Secondary | ICD-10-CM | POA: Diagnosis not present

## 2018-01-28 ENCOUNTER — Encounter: Payer: Self-pay | Admitting: Podiatry

## 2018-01-28 ENCOUNTER — Ambulatory Visit (INDEPENDENT_AMBULATORY_CARE_PROVIDER_SITE_OTHER): Payer: Medicare Other | Admitting: Podiatry

## 2018-01-28 DIAGNOSIS — L84 Corns and callosities: Secondary | ICD-10-CM

## 2018-01-28 DIAGNOSIS — M79676 Pain in unspecified toe(s): Secondary | ICD-10-CM

## 2018-01-28 DIAGNOSIS — L821 Other seborrheic keratosis: Secondary | ICD-10-CM | POA: Diagnosis not present

## 2018-01-28 DIAGNOSIS — E1151 Type 2 diabetes mellitus with diabetic peripheral angiopathy without gangrene: Secondary | ICD-10-CM

## 2018-01-28 DIAGNOSIS — B351 Tinea unguium: Secondary | ICD-10-CM

## 2018-01-28 NOTE — Progress Notes (Signed)
Patient ID: Cindy Wong, female   DOB: 08/28/1931, 84 y.o.   MRN: [id number] Complaint:  Visit Type: Patient returns to my office for continued preventative foot care services. Complaint: Patient states" my nails have grown long and thick and become painful to walk and wear shoes" Patient has been diagnosed with DM with no foot complications. The patient presents for preventative foot care services. No changes to ROS  Podiatric Exam: Vascular: dorsalis pedis and posterior tibial pulses are palpable bilateral. Capillary return is immediate. Temperature gradient is WNL. Skin turgor WNL  Sensorium: Normal Semmes Weinstein monofilament test. Normal tactile sensation bilaterally. Nail Exam: Pt has thick disfigured discolored nails with subungual debris noted bilateral entire nail hallux through fifth toenails Ulcer Exam: There is no evidence of ulcer or pre-ulcerative changes or infection. Orthopedic Exam: Muscle tone and strength are WNL. No limitations in general ROM. No crepitus or effusions noted. Foot type and digits show no abnormalities. Bony prominences are unremarkable. Skin: No Porokeratosis. No infection or ulcers.  Heloma Durum fifth toe left foot asymptomatic.  Diagnosis:  Onychomycosis, , Pain in right toe, pain in left toes,    Treatment & Plan Procedures and Treatment: Consent by patient was obtained for treatment procedures. The patient understood the discussion of treatment and procedures well. All questions were answered thoroughly reviewed. Debridement of mycotic and hypertrophic toenails, 1 through 5 bilateral and clearing of subungual debris. No ulceration, no infection noted. ABN signed for 2019. Return Visit-Office Procedure: Patient instructed to return to the office for a follow up visit 3 months for continued evaluation and treatment.   Gardiner Barefoot DPM

## 2018-02-04 DIAGNOSIS — H2513 Age-related nuclear cataract, bilateral: Secondary | ICD-10-CM | POA: Diagnosis not present

## 2018-02-04 DIAGNOSIS — H25013 Cortical age-related cataract, bilateral: Secondary | ICD-10-CM | POA: Diagnosis not present

## 2018-02-28 DIAGNOSIS — D692 Other nonthrombocytopenic purpura: Secondary | ICD-10-CM | POA: Diagnosis not present

## 2018-02-28 DIAGNOSIS — E559 Vitamin D deficiency, unspecified: Secondary | ICD-10-CM | POA: Diagnosis not present

## 2018-02-28 DIAGNOSIS — M1180 Other specified crystal arthropathies, unspecified site: Secondary | ICD-10-CM | POA: Diagnosis not present

## 2018-02-28 DIAGNOSIS — R69 Illness, unspecified: Secondary | ICD-10-CM | POA: Diagnosis not present

## 2018-02-28 DIAGNOSIS — Z6822 Body mass index (BMI) 22.0-22.9, adult: Secondary | ICD-10-CM | POA: Diagnosis not present

## 2018-02-28 DIAGNOSIS — E119 Type 2 diabetes mellitus without complications: Secondary | ICD-10-CM | POA: Diagnosis not present

## 2018-02-28 DIAGNOSIS — I10 Essential (primary) hypertension: Secondary | ICD-10-CM | POA: Diagnosis not present

## 2018-02-28 DIAGNOSIS — E7800 Pure hypercholesterolemia, unspecified: Secondary | ICD-10-CM | POA: Diagnosis not present

## 2018-02-28 DIAGNOSIS — Z853 Personal history of malignant neoplasm of breast: Secondary | ICD-10-CM | POA: Diagnosis not present

## 2018-02-28 DIAGNOSIS — M859 Disorder of bone density and structure, unspecified: Secondary | ICD-10-CM | POA: Diagnosis not present

## 2018-04-16 ENCOUNTER — Encounter: Payer: Self-pay | Admitting: Gastroenterology

## 2018-04-16 ENCOUNTER — Ambulatory Visit (INDEPENDENT_AMBULATORY_CARE_PROVIDER_SITE_OTHER): Payer: Medicare HMO | Admitting: Gastroenterology

## 2018-04-16 VITALS — BP 156/78 | HR 83 | Ht 67.5 in | Wt 145.6 lb

## 2018-04-16 DIAGNOSIS — Z800 Family history of malignant neoplasm of digestive organs: Secondary | ICD-10-CM

## 2018-04-16 DIAGNOSIS — K6289 Other specified diseases of anus and rectum: Secondary | ICD-10-CM

## 2018-04-16 DIAGNOSIS — R195 Other fecal abnormalities: Secondary | ICD-10-CM

## 2018-04-16 MED ORDER — PEG 3350-KCL-NA BICARB-NACL 420 G PO SOLR: 4000.0000 mL | ORAL | 0 refills | Status: DC

## 2018-04-16 NOTE — Progress Notes (Signed)
Review of pertinent gastrointestinal problems: 1.  Possible family history of colon cancer (thinks her sister MAY have had rectal cancer), personal history of adenomatous polyps.  Colonoscopy Dr. Ardis Hughs June 2011 for fecal occult stool positive found a single diminutive polyp in the descending colon.  This was adenomatous.  She was 85 years old at the time   HPI: This is a very pleasant 85 year old woman who was referred to me by Tisovec, Fransico Him, MD  to evaluate rectal discomfort, change in bowels.  For at least several months she has had some pains in her low back, bilateral gluteus also anal and rectal discomforts.  These are an ache, they are intermittent.  They do not seem to severe but they are also associated round the same time with a about an 8 to 9 pound weight loss and a change in her stools.  They have been softer than usual.  Not liquidy, never bloody.  She has had no fecal incontinence  No abdominal pains.   Eating poorly, not sure why she is eating poorly.  Wonders about depression.  For several months she hasne't felt good; a bit depressed, fatigued. Just not right.  Her sister probably had rectal cancer     Old Data Reviewed: Blood work October 2019 show normal CBC, normal complete metabolic profile except for a calcium of 10.5 and glucose of 131.    Review of systems: Pertinent positive and negative review of systems were noted in the above HPI section. All other review negative.   Past Medical History:  Diagnosis Date  . Adenomatous colon polyp    tubular  . Breast cancer ([place])   . Diabetes mellitus   . Hx of meningitis 2012  . Hyperlipemia   . Hypertension   . Personal history of radiation therapy   . Stomach cancer [facility])     Past Surgical History:  Procedure Laterality Date  . ABDOMINAL HYSTERECTOMY  1974   Supracervical  . BREAST LUMPECTOMY Right 1993  . BREAST SURGERY     Mastectomy  . CHOLECYSTECTOMY    . MASTECTOMY Right   . meningitis     . OOPHORECTOMY     One ovary removed--?side  . Stomach tumor      Current Outpatient Medications  Medication Sig Dispense Refill  . amLODipine (NORVASC) 5 MG tablet Take 5 mg by mouth daily.    . Cholecalciferol (VITAMIN D3) 2000 units capsule Take by mouth.    . escitalopram (LEXAPRO) 20 MG tablet TK 1 T PO QD  4  . ferrous sulfate 325 (65 FE) MG tablet Take 325 mg by mouth daily with breakfast.      . glipiZIDE (GLUCOTROL XL) 10 MG 24 hr tablet Take 10 mg by mouth daily.      . hydrochlorothiazide (HYDRODIURIL) 25 MG tablet Take 25 mg by mouth daily.  10  . Linagliptin-Metformin HCl (JENTADUETO) 2.5-500 MG TABS Take 1 tablet by mouth 2 (two) times daily.     Marland Kitchen lisinopril (PRINIVIL,ZESTRIL) 40 MG tablet Take 40 mg by mouth daily.   0  . Multiple Vitamins-Minerals (CENTRUM SILVER PO) Take 1 tablet by mouth daily.     Glory Rosebush VERIO test strip 2 (two) times daily. for testing  2  . potassium chloride SA (K-DUR,KLOR-CON) 20 MEQ tablet Take 20 mEq by mouth daily.    . pravastatin (PRAVACHOL) 40 MG tablet Take 40 mg by mouth every evening.      No current facility-administered medications for this  visit.     Allergies as of 04/16/2018 - Review Complete 04/16/2018  Allergen Reaction Noted  . Sulfonamide derivatives Hives and Itching 07/27/2009  . Sulfamethoxazole Rash 03/10/2015    Family History  Problem Relation Age of Onset  . Breast cancer Sister 87  . Diabetes Sister   . Hypertension Sister   . Colon cancer Sister   . Melanoma Sister   . Heart disease Brother   . Diabetes Brother   . Hypertension Brother   . Prostate cancer Brother   . Colon cancer Brother     Social History   Socioeconomic History  . Marital status: Widowed    Spouse name: Not on file  . Number of children: 0  . Years of education: Not on file  . Highest education level: Not on file  Occupational History  . Occupation: retired    Fish farm manager: RETIRED  Social Needs  . Financial resource strain:  Not on file  . Food insecurity:    Worry: Not on file    Inability: Not on file  . Transportation needs:    Medical: Not on file    Non-medical: Not on file  Tobacco Use  . Smoking status: Never Smoker  . Smokeless tobacco: Never Used  Substance and Sexual Activity  . Alcohol use: No    Alcohol/week: 0.0 standard drinks  . Drug use: No  . Sexual activity: Never    Birth control/protection: Surgical    Comment: HYST-1st intercourse 64 yo-5 partners  Lifestyle  . Physical activity:    Days per week: Not on file    Minutes per session: Not on file  . Stress: Not on file  Relationships  . Social connections:    Talks on phone: Not on file    Gets together: Not on file    Attends religious service: Not on file    Active member of club or organization: Not on file    Attends meetings of clubs or organizations: Not on file    Relationship status: Not on file  . Intimate partner violence:    Fear of current or ex partner: Not on file    Emotionally abused: Not on file    Physically abused: Not on file    Forced sexual activity: Not on file  Other Topics Concern  . Not on file  Social History Narrative  . Not on file     Physical Exam: BP (!) 156/78   Pulse 83   Ht 5' 7.5" (1.715 m)   Wt 145 lb 9.6 oz (66 kg)   BMI 22.47 kg/m  Constitutional: generally well-appearing Psychiatric: alert and oriented x3 Eyes: extraocular movements intact Mouth: oral pharynx moist, no lesions Neck: supple no lymphadenopathy Cardiovascular: heart regular rate and rhythm Lungs: clear to auscultation bilaterally Abdomen: soft, nontender, nondistended, no obvious ascites, no peritoneal signs, normal bowel sounds Extremities: no lower extremity edema bilaterally Skin: no lesions on visible extremities Rectal exam with female assistant in the room: No tenderness on gluteal palpation, no low back tenderness, there is some very small deflated hemorrhoidal tissue at the anus externally, no anal  fissures, internal examination was normal, stool was brown and not checked for Hemoccult  Assessment and plan: 85 y.o. female with mild change in bowels, slight weight loss, family history of colon rectal cancer, low back, rectal discomfort  I am not sure if there is going to be one overriding diagnosis here but certainly with the change in her bowels, family history of  colon, rectal cancer she needs further examination with a colonoscopy at her soonest convenience.  Since she has had some mild softening of her stools I recommended also a trial of over-the-counter powder fiber supplements for now.  If the above work-up is not revealing then she might need more testing, imaging of abdomen or more likely pelvic imaging.    Please see the "Patient Instructions" section for addition details about the plan.   Owens Loffler, MD [facility] 04/16/2018, 8:28 AM  Cc: [name], MD

## 2018-04-16 NOTE — Patient Instructions (Addendum)
You will be set up for a colonoscopy for change in stools, rectal discomfort, FH of colon cancer (rectal).  Please start taking citrucel (orange flavored) powder fiber supplement.  This may cause some bloating at first but that usually goes away. Begin with a small spoonful and work your way up to a large, heaping spoonful daily over a week.   Thank you for entrusting me with your care and choosing [facility].  Dr Ardis Hughs

## 2018-04-29 ENCOUNTER — Ambulatory Visit: Payer: Medicare Other | Admitting: Podiatry

## 2018-05-06 ENCOUNTER — Telehealth: Payer: Self-pay | Admitting: *Deleted

## 2018-05-06 NOTE — Telephone Encounter (Signed)
Covid-19 travel screening questions  Have you traveled in the last 14 days? No If yes where?  Do you now or have you had a fever in the last 14 days? No  Do you have any respiratory symptoms of shortness of breath or cough now or in the last 14 days? No  Do you have any family members or close contacts with diagnosed or suspected Covid-19? No       

## 2018-05-07 ENCOUNTER — Other Ambulatory Visit: Payer: Self-pay | Admitting: Gastroenterology

## 2018-05-07 ENCOUNTER — Telehealth: Payer: Self-pay

## 2018-05-07 ENCOUNTER — Telehealth: Payer: Self-pay | Admitting: Gastroenterology

## 2018-05-07 ENCOUNTER — Other Ambulatory Visit: Payer: Self-pay

## 2018-05-07 ENCOUNTER — Encounter: Payer: Self-pay | Admitting: Gastroenterology

## 2018-05-07 ENCOUNTER — Ambulatory Visit (AMBULATORY_SURGERY_CENTER): Payer: Medicare HMO | Admitting: Gastroenterology

## 2018-05-07 VITALS — BP 156/64 | HR 76 | Temp 98.9°F | Resp 14 | Ht 67.5 in | Wt 145.0 lb

## 2018-05-07 DIAGNOSIS — R195 Other fecal abnormalities: Secondary | ICD-10-CM

## 2018-05-07 DIAGNOSIS — K6289 Other specified diseases of anus and rectum: Secondary | ICD-10-CM

## 2018-05-07 MED ORDER — SODIUM CHLORIDE 0.9 % IV SOLN: 500.0000 mL | Freq: Once | INTRAVENOUS | Status: DC

## 2018-05-07 NOTE — Progress Notes (Signed)
Pt took one of her diabetic medications this a.m.  Her blood sugar is 102.  She has no feelings of symptoms of low blood sugar.

## 2018-05-07 NOTE — Telephone Encounter (Signed)
Patient had a colonoscopy done this morning. A future Ct with IV and oral contrast was indicated for further testing. Order placed for creatinine level prior to CT. Spoke to patient, she will come back tomorrow for lab draw.

## 2018-05-07 NOTE — Telephone Encounter (Signed)
We spoke. She had uneventful colonoscopy today.  Since then a lot of phlegm, clearing of her throat.  No SOB.  I reassured her that we did not oral or throat manipulations today, advised her to try a benedryl 25mg  pill and to call back if her issues worsen or fail to improve over the next few hours.

## 2018-05-07 NOTE — Progress Notes (Signed)
To PACU, VSS. Report to Rn.tb 

## 2018-05-07 NOTE — Op Note (Signed)
[name] Procedure Date: 05/07/2018 7:32 AM MRN: [id number] Endoscopist: Milus Banister , MD Age: 85 Referring MD:  Date of Birth: [birth year] Gender: Female Account #: [id number] Procedure:                Colonoscopy Indications:              Pelvic, rectal pain. FH of colon cancer. Weight                            loss. Medicines:                Monitored Anesthesia Care Procedure:                Pre-Anesthesia Assessment:                           - Prior to the procedure, a History and Physical                            was performed, and patient medications and                            allergies were reviewed. The patient's tolerance of                            previous anesthesia was also reviewed. The risks                            and benefits of the procedure and the sedation                            options and risks were discussed with the patient.                            All questions were answered, and informed consent                            was obtained. Prior Anticoagulants: The patient has                            taken no previous anticoagulant or antiplatelet                            agents. ASA Grade Assessment: III - A patient with                            severe systemic disease. After reviewing the risks                            and benefits, the patient was deemed in                            satisfactory condition to undergo the procedure.  After obtaining informed consent, the colonoscope                            was passed under direct vision. Throughout the                            procedure, the patient's blood pressure, pulse, and                            oxygen saturations were monitored continuously. The                            Colonoscope was introduced through the anus and                            advanced to the the cecum, identified by                 appendiceal orifice and ileocecal valve. The                            colonoscopy was performed without difficulty. The                            patient tolerated the procedure well. The quality                            of the bowel preparation was excellent. The                            ileocecal valve, appendiceal orifice, and rectum                            were photographed. Scope In: 8:40:24 AM Scope Out: 8:59:04 AM Scope Withdrawal Time: 0 hours 6 minutes 36 seconds  Total Procedure Duration: 0 hours 18 minutes 40 seconds  Findings:                 The entire examined colon appeared normal on direct                            and retroflexion views.                           No polyps or cancers. Complications:            No immediate complications. Estimated blood loss:                            None. Estimated Blood Loss:     Estimated blood loss: none. Impression:               - The entire examined colon is normal on direct and                            retroflexion views.                           -  No polyps or cancers. Recommendation:           - Patient has a contact number available for                            emergencies. The signs and symptoms of potential                            delayed complications were discussed with the                            patient. Return to normal activities tomorrow.                            Written discharge instructions were provided to the                            patient.                           - Resume previous diet.                           - Continue present medications.                           - No repeat colonoscopy.                           - Dr. Ardis Hughs' office to arrange abd/pelvic CT with                            iv and oral contrast for weight loss, lower abd,                            pelvic pain. Milus Banister, MD 05/07/2018 9:03:59 AM This report has been signed  electronically.

## 2018-05-07 NOTE — Telephone Encounter (Signed)
Pt called in wanting to speak with the nurse she had her procedure this morning and she is having  some pain and discomfort in her throat almost as it is going to close up

## 2018-05-07 NOTE — Patient Instructions (Signed)
Patient sent home with CT contrast x 2 bottles, and patient scheduled for creatinine level test.  YOU HAD AN ENDOSCOPIC PROCEDURE TODAY AT [facility]:   Refer to the procedure report that was given to you for any specific questions about what was found during the examination.  If the procedure report does not answer your questions, please call your gastroenterologist to clarify.  If you requested that your care partner not be given the details of your procedure findings, then the procedure report has been included in a sealed envelope for you to review at your convenience later.  YOU SHOULD EXPECT: Some feelings of bloating in the abdomen. Passage of more gas than usual.  Walking can help get rid of the air that was put into your GI tract during the procedure and reduce the bloating. If you had a lower endoscopy (such as a colonoscopy or flexible sigmoidoscopy) you may notice spotting of blood in your stool or on the toilet paper. If you underwent a bowel prep for your procedure, you may not have a normal bowel movement for a few days.  Please Note:  You might notice some irritation and congestion in your nose or some drainage.  This is from the oxygen used during your procedure.  There is no need for concern and it should clear up in a day or so.  SYMPTOMS TO REPORT IMMEDIATELY:   Following lower endoscopy (colonoscopy or flexible sigmoidoscopy):  Excessive amounts of blood in the stool  Significant tenderness or worsening of abdominal pains  Swelling of the abdomen that is new, acute  Fever of 100F or higher  For urgent or emergent issues, a gastroenterologist can be reached at any hour by calling [phone].   DIET:  We do recommend a small meal at first, but then you may proceed to your regular diet.  Drink plenty of fluids but you should avoid alcoholic beverages for 24 hours.  ACTIVITY:  You should plan to take it easy for the rest of today and you should NOT DRIVE  or use heavy machinery until tomorrow (because of the sedation medicines used during the test).    FOLLOW UP: Our staff will call the number listed on your records the next business day following your procedure to check on you and address any questions or concerns that you may have regarding the information given to you following your procedure. If we do not reach you, we will leave a message.  However, if you are feeling well and you are not experiencing any problems, there is no need to return our call.  We will assume that you have returned to your regular daily activities without incident.  If any biopsies were taken you will be contacted by phone or by letter within the next 1-3 weeks.  Please call us at [phone] if you have not heard about the biopsies in 3 weeks.    SIGNATURES/CONFIDENTIALITY: You and/or your care partner have signed paperwork which will be entered into your electronic medical record.  These signatures attest to the fact that that the information above on your After Visit Summary has been reviewed and is understood.  Full responsibility of the confidentiality of this discharge information lies with you and/or your care-partner.

## 2018-05-07 NOTE — Telephone Encounter (Signed)
-----   Message from Bethann Berkshire, RN sent at 05/07/2018  1:36 PM EDT ----- Regarding: Patient question Patient called asking to speak to nurse, had said to nurse on 3rd floor that she felt like her throat was closing.  I spoke with her, she said she didn't feel like it was closing, and could breathe.  She said her throat was sore when she swallows and is hoarse when talking, and she is coughing up mucus, but she is able to clear her throat.  Questioning what from and what to take?

## 2018-05-08 ENCOUNTER — Ambulatory Visit (INDEPENDENT_AMBULATORY_CARE_PROVIDER_SITE_OTHER): Payer: Medicare HMO

## 2018-05-08 ENCOUNTER — Ambulatory Visit (INDEPENDENT_AMBULATORY_CARE_PROVIDER_SITE_OTHER)
Admission: EM | Admit: 2018-05-08 | Discharge: 2018-05-08 | Disposition: A | Discharge status: Home / Self Care | Payer: Medicare HMO | Source: Home / Self Care | Attending: Family Medicine | Admitting: Family Medicine

## 2018-05-08 ENCOUNTER — Other Ambulatory Visit: Payer: Self-pay

## 2018-05-08 ENCOUNTER — Encounter (HOSPITAL_COMMUNITY): Payer: Self-pay

## 2018-05-08 ENCOUNTER — Emergency Department (HOSPITAL_COMMUNITY)
Admission: EM | Admit: 2018-05-08 | Discharge: 2018-05-08 | Disposition: A | Discharge status: Home / Self Care | Payer: Medicare HMO | Attending: Emergency Medicine | Admitting: Emergency Medicine

## 2018-05-08 ENCOUNTER — Telehealth: Payer: Self-pay | Admitting: *Deleted

## 2018-05-08 ENCOUNTER — Emergency Department (HOSPITAL_COMMUNITY): Payer: Medicare HMO

## 2018-05-08 DIAGNOSIS — Z79899 Other long term (current) drug therapy: Secondary | ICD-10-CM | POA: Insufficient documentation

## 2018-05-08 DIAGNOSIS — J029 Acute pharyngitis, unspecified: Secondary | ICD-10-CM | POA: Diagnosis present

## 2018-05-08 DIAGNOSIS — E119 Type 2 diabetes mellitus without complications: Secondary | ICD-10-CM | POA: Diagnosis not present

## 2018-05-08 DIAGNOSIS — R05 Cough: Secondary | ICD-10-CM | POA: Diagnosis not present

## 2018-05-08 DIAGNOSIS — I10 Essential (primary) hypertension: Secondary | ICD-10-CM | POA: Diagnosis not present

## 2018-05-08 DIAGNOSIS — R1310 Dysphagia, unspecified: Secondary | ICD-10-CM

## 2018-05-08 LAB — COMPREHENSIVE METABOLIC PANEL
ALT: 19 U/L (ref 0–44)
AST: 21 U/L (ref 15–41)
Albumin: 3.4 g/dL — ABNORMAL LOW (ref 3.5–5.0)
Alkaline Phosphatase: 40 U/L (ref 38–126)
Anion gap: 11 (ref 5–15)
BUN: 8 mg/dL (ref 8–23)
CO2: 25 mmol/L (ref 22–32)
Calcium: 9.4 mg/dL (ref 8.9–10.3)
Chloride: 105 mmol/L (ref 98–111)
Creatinine, Ser: 1 mg/dL (ref 0.44–1.00)
GFR calc Af Amer: 59 mL/min — ABNORMAL LOW (ref >60)
GFR calc non Af Amer: 51 mL/min — ABNORMAL LOW (ref >60)
Glucose, Bld: 102 mg/dL — ABNORMAL HIGH (ref 70–99)
Potassium: 3.7 mmol/L (ref 3.5–5.1)
Sodium: 141 mmol/L (ref 135–145)
Total Bilirubin: 1 mg/dL (ref 0.3–1.2)
Total Protein: 6.3 g/dL — ABNORMAL LOW (ref 6.5–8.1)

## 2018-05-08 LAB — CBC
HCT: 43.8 % (ref 36.0–46.0)
Hemoglobin: 13.4 g/dL (ref 12.0–15.0)
MCH: 26.6 pg (ref 26.0–34.0)
MCHC: 30.6 g/dL (ref 30.0–36.0)
MCV: 87.1 fL (ref 80.0–100.0)
Platelets: 245 10*3/uL (ref 150–400)
RBC: 5.03 MIL/uL (ref 3.87–5.11)
RDW: 13 % (ref 11.5–15.5)
WBC: 11 10*3/uL — ABNORMAL HIGH (ref 4.0–10.5)
nRBC: 0 % (ref 0.0–0.2)

## 2018-05-08 LAB — POCT RAPID STREP A: Streptococcus, Group A Screen (Direct): NEGATIVE

## 2018-05-08 MED ORDER — SODIUM CHLORIDE 0.9 % IV BOLUS
1000.0000 mL | Freq: Once | INTRAVENOUS | Status: AC
  Administered 2018-05-08: 1000 mL via INTRAVENOUS

## 2018-05-08 MED ORDER — IOHEXOL 300 MG/ML  SOLN
75.0000 mL | Freq: Once | INTRAMUSCULAR | Status: AC | PRN
  Administered 2018-05-08: 75 mL via INTRAVENOUS

## 2018-05-08 NOTE — Telephone Encounter (Signed)
If she does not feel better in next 3-4 hours she should go to ER or urgent care for evaluation

## 2018-05-08 NOTE — ED Notes (Signed)
ED Provider at bedside. 

## 2018-05-08 NOTE — ED Triage Notes (Signed)
Pt arrives POV sent by [facility]. Pt reports sore throat since yesterday, denies SOB. Pain 10/10. Pt denies any chest pain.

## 2018-05-08 NOTE — ED Provider Notes (Signed)
[facility] EMERGENCY DEPARTMENT Provider Note   CSN: [id number] Arrival date & time: 05/08/18  1321    History   Chief Complaint Chief Complaint  Patient presents with  . Sore Throat    HPI Cindy Wong is a 85 y.o. female presenting for new onset throat pain and dysphagia.  Patient was in her usual state of health until yesterday when she went for a colonoscopy, and she reports that no intubation was performed. She was hoarse coming out of the procedure per her family member at bedside.  She developed sore throat, dysphasia, and difficulty clearing foamy white secretions around 2 PM yesterday afternoon.  She reports it is difficult to swallow water due to pain.  She has not had any drooling or water coming out of her mouth when she tries to drink.  She last ate 3 days ago and has taken poor p.o. liquids for the last 3 days.  She feels as though there is something stuck in her throat.  No emesis.  She has had loose stools with colonoscopy cleanout.  No changes in urination such as dysuria, urinary frequency or urgency.  No fevers.  No rhinorrhea or congestion.  HPI  Past Medical History:  Diagnosis Date  . Adenomatous colon polyp    tubular  . Anxiety   . Arthritis   . Blood transfusion without reported diagnosis   . Breast cancer ([place])   . Cataract   . Depression   . Diabetes mellitus   . Hx of meningitis 2012  . Hyperlipemia   . Hypertension   . Personal history of radiation therapy   . Stomach cancer [facility])     Patient Active Problem List   Diagnosis Date Noted  . History of ductal carcinoma in situ (DCIS) of breast 10/27/2015  . Personal history of malignant gastrointestinal stromal tumor (GIST) 10/27/2015  . DCIS (ductal carcinoma in situ) 10/23/2011  . Fibroid   . Diabetes mellitus   . Cancer ([place])   . Hypertension   . Meningitis due to bacterium 02/05/2011  . Chronic mastoiditis of right side 02/05/2011  . BLOOD IN STOOL 07/25/2009     Past Surgical History:  Procedure Laterality Date  . ABDOMINAL HYSTERECTOMY  1974   Supracervical  . BREAST LUMPECTOMY Right 1993  . BREAST SURGERY     Mastectomy  . CHOLECYSTECTOMY    . COLONOSCOPY    . MASTECTOMY Right   . meningitis    . OOPHORECTOMY     One ovary removed--?side  . Stomach tumor    . UPPER GASTROINTESTINAL ENDOSCOPY       OB History    Gravida  1   Para  1   Term      Preterm  1   AB      Living  0     SAB      TAB      Ectopic      Multiple      Live Births               Home Medications    Prior to Admission medications   Medication Sig Start Date End Date Taking? Authorizing Provider  amLODipine (NORVASC) 5 MG tablet Take 5 mg by mouth daily.   Yes [provider]  escitalopram (LEXAPRO) 20 MG tablet Take 20 mg by mouth daily.  09/24/17  Yes [provider]  glipiZIDE (GLUCOTROL XL) 10 MG 24 hr tablet Take 10 mg by  mouth daily.     Yes [provider]  hydrochlorothiazide (HYDRODIURIL) 25 MG tablet Take 25 mg by mouth daily. 10/14/17  Yes [provider]  Linagliptin-Metformin HCl (JENTADUETO) 2.5-500 MG TABS Take 1 tablet by mouth 2 (two) times daily.    Yes [provider]  lisinopril (PRINIVIL,ZESTRIL) 40 MG tablet Take 40 mg by mouth daily.  11/09/16  Yes [provider]  Multiple Vitamins-Minerals (CENTRUM SILVER PO) Take 1 tablet by mouth daily.    Yes [provider]  potassium chloride SA (K-DUR,KLOR-CON) 20 MEQ tablet Take 20 mEq by mouth daily.   Yes [provider]  pravastatin (PRAVACHOL) 40 MG tablet Take 40 mg by mouth every evening.    Yes [provider]  [facility] VERIO test strip 2 (two) times daily. for testing 08/11/17   [provider]    Family History Family History  Problem Relation Age of Onset  . Breast cancer Sister 67  . Diabetes Sister   . Hypertension Sister   . Colon cancer Sister   . Melanoma Sister   . Heart  disease Brother   . Diabetes Brother   . Hypertension Brother   . Prostate cancer Brother   . Colon cancer Brother   . Esophageal cancer Neg Hx   . Rectal cancer Neg Hx   . Stomach cancer Neg Hx     Social History Social History   Tobacco Use  . Smoking status: Never Smoker  . Smokeless tobacco: Never Used  Substance Use Topics  . Alcohol use: No    Alcohol/week: 0.0 standard drinks  . Drug use: No     Allergies   Sulfonamide derivatives and Sulfamethoxazole   Review of Systems Review of Systems  Constitutional: Negative for activity change, chills and diaphoresis.  Respiratory: Negative for chest tightness, shortness of breath, wheezing and stridor.   Gastrointestinal: Negative for abdominal distention, constipation, nausea and vomiting.  Genitourinary: Negative for difficulty urinating, frequency and urgency.  Neurological: Negative for dizziness, light-headedness and headaches.  Psychiatric/Behavioral: Negative for confusion.     Physical Exam Updated Vital Signs BP 133/66   Pulse 83   Temp 98.2 F (36.8 C) (Oral)   Resp 18   SpO2 100%   Physical Exam Constitutional:      General: She is not in acute distress.    Appearance: She is well-developed. She is not toxic-appearing.  HENT:     Head: Normocephalic and atraumatic.     Right Ear: Tympanic membrane normal.     Left Ear: Tympanic membrane normal.     Nose: No congestion or rhinorrhea.     Mouth/Throat:     Mouth: Mucous membranes are moist.     Pharynx: Uvula midline. No pharyngeal swelling, oropharyngeal exudate, posterior oropharyngeal erythema or uvula swelling.     Tonsils: No tonsillar exudate or tonsillar abscesses.  Eyes:     Conjunctiva/sclera: Conjunctivae normal.  Neck:     Musculoskeletal: Normal range of motion.  Cardiovascular:     Rate and Rhythm: Normal rate and regular rhythm.     Heart sounds: No murmur. No friction rub. No gallop.   Pulmonary:     Effort: Pulmonary effort is  normal. No respiratory distress.     Breath sounds: Normal breath sounds. No stridor. No wheezing, rhonchi or rales.  Abdominal:     General: There is no distension.     Palpations: Abdomen is soft.     Tenderness: There is no abdominal tenderness.  Skin:    General: Skin is warm and dry.     Capillary Refill: Capillary refill takes less than 2 seconds.  Neurological:     General: No focal deficit present.     Mental Status: She is alert.  Psychiatric:        Behavior: Behavior normal.    ED Treatments / Results  Labs (all labs ordered are listed, but only abnormal results are displayed) Labs Reviewed  COMPREHENSIVE METABOLIC PANEL - Abnormal; Notable for the following components:      Result Value   Glucose, Bld 102 (*)    Total Protein 6.3 (*)    Albumin 3.4 (*)    GFR calc non Af Amer 51 (*)    GFR calc Af Amer 59 (*)    All other components within normal limits  CBC - Abnormal; Notable for the following components:   WBC 11.0 (*)    All other components within normal limits    EKG None  Radiology Dg Chest 2 View  Result Date: 05/08/2018 CLINICAL DATA:  Sore throat.  Cough. EXAM: CHEST - 2 VIEW COMPARISON:  11/28/2016. FINDINGS: Mediastinum and hilar structures normal. Lungs are clear. No pleural effusion or pneumothorax. Cardiomegaly with normal pulmonary vascularity. No acute bony abnormality IMPRESSION: 1.  Stable cardiomegaly.  No pulmonary venous congestion. 2.  No focal pulmonary infiltrate. Electronically Signed   By: Marcello Moores  Register   On: 05/08/2018 12:31   Ct Soft Tissue Neck W Contrast  Result Date: 05/08/2018 CLINICAL DATA:  85 year old female with sore throat following colonoscopy. EXAM: CT NECK WITH CONTRAST TECHNIQUE: Multidetector CT imaging of the neck was performed using the standard protocol following the bolus administration of intravenous contrast. CONTRAST:  22mL OMNIPAQUE IOHEXOL 300 MG/ML  SOLN COMPARISON:  Brain MRI 05/04/2017.  Head CT  11/28/2016 and earlier. FINDINGS: Pharynx and larynx: There is mild mucosal hyper enhancement throughout the supraglottic larynx, but no discrete soft tissue mass. Trace layering fluid in the hypopharynx. The epiglottis is minimally thickened. The pharyngeal mucosa and soft tissue contours appear normal. Negative parapharyngeal and retropharyngeal spaces. Salivary glands: Negative sublingual space. There is asymmetric hypertrophy of the right submandibular gland (series 3, image 46) which is associated with asymmetric atrophy of the right submandibular gland. There is no dilatation of the right submandibular duct. The left submandibular and sublingual glands appear within normal limits. No sialolithiasis identified. Parotid glands appear symmetric and within normal limits. Thyroid: Negative for age; subcentimeter bilateral thyroid hypodense nodules do not meet consensus criteria for ultrasound follow-up. Lymph nodes: No cervical lymphadenopathy. Bilateral lymph nodes are diminutive. The largest node is 4-6 millimeter short axis. Vascular: Major vascular structures in the neck and at the skull base are patent. There is bilateral carotid bifurcation atherosclerosis. The left vertebral artery is dominant. Limited intracranial: Negative. Visualized orbits: Negative. Mastoids and visualized paranasal sinuses: Stable paranasal sinus pneumatization, normal aside from a small chronic right maxillary sinus mucous retention cyst. Sequelae of right mastoidectomy. Residual right mastoid fluid appears stable since the 2019 MRI. The adjacent right sigmoid sinus and IJ bulb remain patent. The right tympanic cavity is clear. Contralateral left tympanic cavity and mastoids are clear. Skeleton: Previous right mastoidectomy and right temporal craniotomy is partially visible. Bilateral TMJ degeneration. Mild for age degenerative changes in the cervical spine. No acute osseous abnormality identified. Upper chest: Negative visible upper  lungs and mediastinum. IMPRESSION: 1. Mild generalized soft tissue thickening and mucosal hyper enhancement at the supraglottic larynx might indicate infectious  laryngitis in this clinical setting. No lymphadenopathy or abscess. There is trace layering fluid in the hypopharynx. The remaining pharynx appears normal. 2. No other acute findings in the neck. Chronic right submandibular gland atrophy with compensatory hypertrophy of the right sublingual gland. Previous right temporal craniotomy and right mastoidectomy with stable residual mastoid fluid since the 2019 MRI. Electronically Signed   By: Genevie Ann M.D.   On: 05/08/2018 15:46    Procedures Procedures (including critical care time)  Medications Ordered in ED Medications  sodium chloride 0.9 % bolus 1,000 mL (1,000 mLs Intravenous New Bag/Given 05/08/18 1431)  iohexol (OMNIPAQUE) 300 MG/ML solution 75 mL (75 mLs Intravenous Contrast Given 05/08/18 1519)     Initial Impression / Assessment and Plan / ED Course  I have reviewed the triage vital signs and the nursing notes.  Pertinent labs & imaging results that were available during my care of the patient were reviewed by me and considered in my medical decision making (see chart for details).     Dysphagia/throat pain - new onset over the past 2 days. Differential broad and includes foreign body, esophagitis, epiglottitis, retropharyngeal abscess. Basic labs demonstrate no leukocytosis with WBC 11.0. Hgb WNL 13.4. Cr 1.0. Electrolytes WNL.  Rapid strep negative. CT neck soft tissue demonstrates soft tissue thickening and mucosal hyper-enhancement at the supraglottic larynx suggestive of laryngitis.  Patient remains well-appearing throughout course in the ED. She has no difficulty breathing. She is hungry and would like to try eating. She was discharged with conservative management of viral laryngitis including PO fluids, tea, humidified air. Strict return precautions were provided including  inability to stay hydrated or worsening dysphagia/sensation of difficulty breathing.  Final Clinical Impressions(s) / ED Diagnoses   Final diagnoses:  Viral pharyngitis    ED Discharge Orders    None       Everrett Coombe, MD 05/08/18 9892    Jola Schmidt, MD 05/08/18 [phone]

## 2018-05-08 NOTE — ED Provider Notes (Signed)
[place]    CSN: [id number] Arrival date & time: 05/08/18  1125     History   Chief Complaint Chief Complaint  Patient presents with  . Sore Throat    HPI Cindy Wong is a 85 y.o. female.   Patient is an 85 year old female who presents today with increased phlegm in throat, unable to clear throat, throat irritation.  This started yesterday after she had a colonoscopy performed. Reports that they did not use any artificial airway. She took benadryl as instructed without relief thinking she was having an allergic reaction. Her symptoms have worsened. She is also coughing up white/clear  phlegm.  Denies any chest pain, shortness of breath.  Mild trouble swallowing.  No fevers, chills, night sweats. She has not eaten in 2 days. Pt is a diabetic. She has been sipping fluids with some difficulty.   ROS per HPI    Sore Throat     Past Medical History:  Diagnosis Date  . Adenomatous colon polyp    tubular  . Anxiety   . Arthritis   . Blood transfusion without reported diagnosis   . Breast cancer ([place])   . Cataract   . Depression   . Diabetes mellitus   . Hx of meningitis 2012  . Hyperlipemia   . Hypertension   . Personal history of radiation therapy   . Stomach cancer [facility])     Patient Active Problem List   Diagnosis Date Noted  . History of ductal carcinoma in situ (DCIS) of breast 10/27/2015  . Personal history of malignant gastrointestinal stromal tumor (GIST) 10/27/2015  . DCIS (ductal carcinoma in situ) 10/23/2011  . Fibroid   . Diabetes mellitus   . Cancer ([place])   . Hypertension   . Meningitis due to bacterium 02/05/2011  . Chronic mastoiditis of right side 02/05/2011  . BLOOD IN STOOL 07/25/2009    Past Surgical History:  Procedure Laterality Date  . ABDOMINAL HYSTERECTOMY  1974   Supracervical  . BREAST LUMPECTOMY Right 1993  . BREAST SURGERY     Mastectomy  . CHOLECYSTECTOMY    . COLONOSCOPY    . MASTECTOMY Right   .  meningitis    . OOPHORECTOMY     One ovary removed--?side  . Stomach tumor    . UPPER GASTROINTESTINAL ENDOSCOPY      OB History    Gravida  1   Para  1   Term      Preterm  1   AB      Living  0     SAB      TAB      Ectopic      Multiple      Live Births               Home Medications    Prior to Admission medications   Medication Sig Start Date End Date Taking? Authorizing Provider  amLODipine (NORVASC) 5 MG tablet Take 5 mg by mouth daily.    [provider]  Cholecalciferol (VITAMIN D3) 2000 units capsule Take by mouth.    [provider]  escitalopram (LEXAPRO) 20 MG tablet TK 1 T PO QD 09/24/17   [provider]  ferrous sulfate 325 (65 FE) MG tablet Take 325 mg by mouth daily with breakfast.      [provider]  glipiZIDE (GLUCOTROL XL) 10 MG 24 hr tablet Take 10 mg by mouth daily.      [provider]  hydrochlorothiazide (HYDRODIURIL) 25 MG tablet Take 25 mg by mouth daily. 10/14/17   [provider]  Linagliptin-Metformin HCl (JENTADUETO) 2.5-500 MG TABS Take 1 tablet by mouth 2 (two) times daily.     [provider]  lisinopril (PRINIVIL,ZESTRIL) 40 MG tablet Take 40 mg by mouth daily.  11/09/16   [provider]  Multiple Vitamins-Minerals (CENTRUM SILVER PO) Take 1 tablet by mouth daily.     [provider]  [facility] VERIO test strip 2 (two) times daily. for testing 08/11/17   [provider]  potassium chloride SA (K-DUR,KLOR-CON) 20 MEQ tablet Take 20 mEq by mouth daily.    [provider]  pravastatin (PRAVACHOL) 40 MG tablet Take 40 mg by mouth every evening.     [provider]    Family History Family History  Problem Relation Age of Onset  . Breast cancer Sister 67  . Diabetes Sister   . Hypertension Sister   . Colon cancer Sister   . Melanoma Sister   . Heart disease Brother   . Diabetes Brother   . Hypertension Brother   .  Prostate cancer Brother   . Colon cancer Brother   . Esophageal cancer Neg Hx   . Rectal cancer Neg Hx   . Stomach cancer Neg Hx     Social History Social History   Tobacco Use  . Smoking status: Never Smoker  . Smokeless tobacco: Never Used  Substance Use Topics  . Alcohol use: No    Alcohol/week: 0.0 standard drinks  . Drug use: No     Allergies   Sulfonamide derivatives and Sulfamethoxazole   Review of Systems Review of Systems   Physical Exam Triage Vital Signs ED Triage Vitals  Enc Vitals Group     BP 05/08/18 1135 140/77     Pulse Rate 05/08/18 1135 92     Resp 05/08/18 1135 16     Temp 05/08/18 1135 98.7 F (37.1 C)     Temp Source 05/08/18 1135 Oral     SpO2 05/08/18 1135 98 %     Weight --      Height --      Head Circumference --      Peak Flow --      Pain Score 05/08/18 1133 7     Pain Loc --      Pain Edu? --      Excl. in [place]? --    No data found.  Updated Vital Signs BP 140/77 (BP Location: Left Arm)   Pulse 92   Temp 98.7 F (37.1 C) (Oral)   Resp 16   SpO2 98%   Visual Acuity Right Eye Distance:   Left Eye Distance:   Bilateral Distance:    Right Eye Near:   Left Eye Near:    Bilateral Near:     Physical Exam Vitals signs and nursing note reviewed.  Constitutional:      General: She is not in acute distress.    Appearance: She is well-developed. She is not ill-appearing or toxic-appearing.     Comments: Can hear phlegm in the throat when pt is talking.   HENT:     Head: Normocephalic and atraumatic.     Right Ear: Tympanic membrane and ear canal normal.     Left Ear: Tympanic membrane and ear canal normal.     Mouth/Throat:     Tonsils: 0 on the right. 0 on the left.  Comments: Clear phlegm in throat Unable to get a good view of the posterior oropharynx No tongue swelling. No trismus.  Eyes:     Conjunctiva/sclera: Conjunctivae normal.  Neck:     Musculoskeletal: Normal range of motion and neck supple.   Cardiovascular:     Rate and Rhythm: Normal rate and regular rhythm.  Pulmonary:     Effort: Pulmonary effort is normal. No respiratory distress.     Breath sounds: Rhonchi present.     Comments: Rhonchi in the upper airways.  Skin:    General: Skin is warm and dry.  Neurological:     Mental Status: She is alert.  Psychiatric:        Mood and Affect: Mood normal.      UC Treatments / Results  Labs (all labs ordered are listed, but only abnormal results are displayed) Labs Reviewed  CULTURE, GROUP A STREP [facility])  POCT RAPID STREP A    EKG None  Radiology Dg Chest 2 View  Result Date: 05/08/2018 CLINICAL DATA:  Sore throat.  Cough. EXAM: CHEST - 2 VIEW COMPARISON:  11/28/2016. FINDINGS: Mediastinum and hilar structures normal. Lungs are clear. No pleural effusion or pneumothorax. Cardiomegaly with normal pulmonary vascularity. No acute bony abnormality IMPRESSION: 1.  Stable cardiomegaly.  No pulmonary venous congestion. 2.  No focal pulmonary infiltrate. Electronically Signed   By: Marcello Moores  Register   On: 05/08/2018 12:31    Procedures Procedures (including critical care time)  Medications Ordered in UC Medications - No data to display  Initial Impression / Assessment and Plan / UC Course  I have reviewed the triage vital signs and the nursing notes.  Pertinent labs & imaging results that were available during my care of the patient were reviewed by me and considered in my medical decision making (see chart for details).     Pt x ray normal I am concerned of the amount of upper airway secretions and her being unable to clear them. Mild dysphagia.  She is unable to lay flat due the amount of secretions and closing off her airway. She has worsened since yesterday. I am worried about sending her home. I believe that she needs to go to the ER to have further evaluation.   Final Clinical Impressions(s) / UC Diagnoses   Final diagnoses:  None     Discharge  Instructions     Rapid strep test was negative I am concerned you cannot clear your secretions and you are having trouble swallowing.  I would like for you to go to the ER for further evaluation.     ED Prescriptions    None     Controlled Substance Prescriptions [place] Controlled Substance Registry consulted? Not Applicable   Orvan July, NP 05/08/18 1312

## 2018-05-08 NOTE — Discharge Instructions (Addendum)
Rapid strep test was negative I am concerned you cannot clear your secretions and you are having trouble swallowing.  I would like for you to go to the ER for further evaluation.

## 2018-05-08 NOTE — Telephone Encounter (Signed)
  Follow up Call-  Call back number 05/07/2018  Post procedure Call Back phone  # [phone]  Permission to leave phone message Yes  Some recent data might be hidden     Patient questions:  Do you have a fever, pain , or abdominal swelling? No. Pain Score: see below  Have you tolerated food without any problems? No.  Have you been able to return to your normal activities? No.  Do you have any questions about your discharge instructions: Diet   No. Medications  No. Follow up visit  No.  Do you have questions or concerns about your Care? See below  Actions: * If pain score is 4 or above: Physician/ provider Notified : Owens Loffler, MD.   Sore throat persists and states it is even worse today- unable to rate; trouble swallowing, even water.  Pt states she is coughing up phlegm.  She does not have a thermometer, but doesn't feel chilled or achy.  I asked her if she took the Benadryl that Dr. Ardis Hughs advised her to and she said that she misunderstood and took a Dramamine instead.  She is going to take the Benadryl now.    Dr. Ardis Hughs- please advise.  Thanks, [employer]

## 2018-05-08 NOTE — Discharge Instructions (Addendum)
You were seen in the emergency department for throat pain and dehydration. You were given fluids and tests were done in the emergency department including a CT scan of your neck.   Your symptoms are most likely caused by a viral laryngitis. Please continue to drink fluids at home. Warm tea and humidified air may be beneficial.  If you begin to develop difficulty breathing or if you are unable to stay hydrated by mouth, these would be reasons to return to care.

## 2018-05-08 NOTE — Telephone Encounter (Signed)
Pt states she is feeling "a little better" after taking the Bendadryl.  I told her Dr. Ardis Hughs' advice to go to the ED or urgent care if she isn't feeling better in 3-4 hours.  Understanding voiced

## 2018-05-08 NOTE — ED Triage Notes (Signed)
Patient presents to Urgent Care with complaints of sore throat since yesterday. Patient states she had a colonoscopy yesterday and had been having chills prior, pt sates throat feels raw today, afebrile in triage.

## 2018-05-09 ENCOUNTER — Telehealth: Payer: Self-pay

## 2018-05-09 NOTE — Telephone Encounter (Signed)
Dr Ardis Hughs can the CT scan wait for 4-6 weeks? See procedure report dated 3/18

## 2018-05-10 LAB — CULTURE, GROUP A STREP (THRC)

## 2018-05-12 ENCOUNTER — Other Ambulatory Visit: Payer: Self-pay

## 2018-05-12 DIAGNOSIS — K6289 Other specified diseases of anus and rectum: Secondary | ICD-10-CM

## 2018-05-12 DIAGNOSIS — R109 Unspecified abdominal pain: Secondary | ICD-10-CM

## 2018-05-12 DIAGNOSIS — Z800 Family history of malignant neoplasm of digestive organs: Secondary | ICD-10-CM

## 2018-05-12 DIAGNOSIS — R634 Abnormal weight loss: Secondary | ICD-10-CM

## 2018-05-12 NOTE — Telephone Encounter (Signed)
You have been scheduled for a CT scan of the abdomen and pelvis at [facility] ([address]---this is in the same building as [facility]).   You are scheduled on 05/13/18 at 1130 am. You should arrive 15 minutes prior to your appointment time for registration. Please follow the written instructions below on the day of your exam:  WARNING: IF YOU ARE ALLERGIC TO IODINE/X-RAY DYE, PLEASE NOTIFY RADIOLOGY IMMEDIATELY AT [phone]! YOU WILL BE GIVEN A 13 HOUR PREMEDICATION PREP.  1) Do not eat or drink anything after 730 am (4 hours prior to your test) 2) You have been given 2 bottles of oral contrast to drink. The solution may taste better if refrigerated, but do NOT add ice or any other liquid to this solution. Shake well before drinking.   Drink 1 bottle of contrast @ 930 am (2 hours prior to your exam) Drink 1 bottle of contrast @ 1030 am (1 hour prior to your exam)  You may take any medications as prescribed with a small amount of water, if necessary. If you take any of the following medications: METFORMIN, GLUCOPHAGE, GLUCOVANCE, AVANDAMET, RIOMET, FORTAMET, [place] MET, JANUMET, GLUMETZA or METAGLIP, you MAY be asked to HOLD this medication 48 hours AFTER the exam.  The purpose of you drinking the oral contrast is to aid in the visualization of your intestinal tract. The contrast solution may cause some diarrhea. Depending on your individual set of symptoms, you may also receive an intravenous injection of x-ray contrast/dye. Plan on being at [facility] for 30 minutes or longer, depending on the type of exam you are having performed.  This test typically takes 30-45 minutes to complete.  If you have any questions regarding your exam or if you need to reschedule, you may call the CT department at [phone] between the hours of 8:00 am and 5:00 pm, Monday-Friday.  ________________________________________________________________________

## 2018-05-12 NOTE — Telephone Encounter (Signed)
The patient has been notified of this information and all questions answered.

## 2018-05-12 NOTE — Telephone Encounter (Signed)
I do not think she should wait 4-6 weeks for the CT

## 2018-05-13 ENCOUNTER — Ambulatory Visit (INDEPENDENT_AMBULATORY_CARE_PROVIDER_SITE_OTHER)
Admission: RE | Admit: 2018-05-13 | Discharge: 2018-05-13 | Disposition: A | Discharge status: Home / Self Care | Payer: Medicare HMO | Source: Ambulatory Visit | Attending: Gastroenterology | Admitting: Gastroenterology

## 2018-05-13 ENCOUNTER — Other Ambulatory Visit: Payer: Self-pay

## 2018-05-13 DIAGNOSIS — R634 Abnormal weight loss: Secondary | ICD-10-CM | POA: Diagnosis not present

## 2018-05-13 DIAGNOSIS — R109 Unspecified abdominal pain: Secondary | ICD-10-CM | POA: Diagnosis not present

## 2018-05-13 DIAGNOSIS — K6289 Other specified diseases of anus and rectum: Secondary | ICD-10-CM | POA: Diagnosis not present

## 2018-05-13 DIAGNOSIS — Z800 Family history of malignant neoplasm of digestive organs: Secondary | ICD-10-CM

## 2018-05-13 MED ORDER — IOHEXOL 300 MG/ML  SOLN
100.0000 mL | Freq: Once | INTRAMUSCULAR | Status: AC | PRN
  Administered 2018-05-13: 100 mL via INTRAVENOUS

## 2018-08-12 DIAGNOSIS — L821 Other seborrheic keratosis: Secondary | ICD-10-CM | POA: Diagnosis not present

## 2018-08-12 DIAGNOSIS — L905 Scar conditions and fibrosis of skin: Secondary | ICD-10-CM | POA: Diagnosis not present

## 2018-08-12 DIAGNOSIS — L858 Other specified epidermal thickening: Secondary | ICD-10-CM | POA: Diagnosis not present

## 2018-08-13 ENCOUNTER — Telehealth (HOSPITAL_COMMUNITY): Payer: Self-pay | Admitting: Rehabilitation

## 2018-08-13 ENCOUNTER — Other Ambulatory Visit (HOSPITAL_COMMUNITY): Payer: Self-pay | Admitting: Internal Medicine

## 2018-08-13 DIAGNOSIS — R001 Bradycardia, unspecified: Secondary | ICD-10-CM | POA: Diagnosis not present

## 2018-08-13 DIAGNOSIS — R008 Other abnormalities of heart beat: Secondary | ICD-10-CM | POA: Diagnosis not present

## 2018-08-13 DIAGNOSIS — I10 Essential (primary) hypertension: Secondary | ICD-10-CM | POA: Diagnosis not present

## 2018-08-13 DIAGNOSIS — R0989 Other specified symptoms and signs involving the circulatory and respiratory systems: Secondary | ICD-10-CM

## 2018-08-13 DIAGNOSIS — R9431 Abnormal electrocardiogram [ECG] [EKG]: Secondary | ICD-10-CM | POA: Diagnosis not present

## 2018-08-13 DIAGNOSIS — R5383 Other fatigue: Secondary | ICD-10-CM | POA: Diagnosis not present

## 2018-08-13 NOTE — Telephone Encounter (Signed)

## 2018-08-14 ENCOUNTER — Ambulatory Visit (HOSPITAL_COMMUNITY)
Admission: RE | Admit: 2018-08-14 | Discharge: 2018-08-14 | Disposition: A | Discharge status: Home / Self Care | Payer: Medicare Other | Source: Ambulatory Visit | Attending: Family | Admitting: Family

## 2018-08-14 ENCOUNTER — Other Ambulatory Visit: Payer: Self-pay

## 2018-08-14 DIAGNOSIS — R0989 Other specified symptoms and signs involving the circulatory and respiratory systems: Secondary | ICD-10-CM

## 2018-08-27 DIAGNOSIS — I10 Essential (primary) hypertension: Secondary | ICD-10-CM | POA: Diagnosis not present

## 2018-08-27 DIAGNOSIS — E119 Type 2 diabetes mellitus without complications: Secondary | ICD-10-CM | POA: Diagnosis not present

## 2018-08-27 DIAGNOSIS — M859 Disorder of bone density and structure, unspecified: Secondary | ICD-10-CM | POA: Diagnosis not present

## 2018-08-27 DIAGNOSIS — E7800 Pure hypercholesterolemia, unspecified: Secondary | ICD-10-CM | POA: Diagnosis not present

## 2018-08-29 DIAGNOSIS — I10 Essential (primary) hypertension: Secondary | ICD-10-CM | POA: Diagnosis not present

## 2018-08-29 DIAGNOSIS — R82998 Other abnormal findings in urine: Secondary | ICD-10-CM | POA: Diagnosis not present

## 2018-09-03 DIAGNOSIS — E559 Vitamin D deficiency, unspecified: Secondary | ICD-10-CM | POA: Diagnosis not present

## 2018-09-03 DIAGNOSIS — K222 Esophageal obstruction: Secondary | ICD-10-CM | POA: Diagnosis not present

## 2018-09-03 DIAGNOSIS — F331 Major depressive disorder, recurrent, moderate: Secondary | ICD-10-CM | POA: Diagnosis not present

## 2018-09-03 DIAGNOSIS — I10 Essential (primary) hypertension: Secondary | ICD-10-CM | POA: Diagnosis not present

## 2018-09-03 DIAGNOSIS — M1180 Other specified crystal arthropathies, unspecified site: Secondary | ICD-10-CM | POA: Diagnosis not present

## 2018-09-03 DIAGNOSIS — M8580 Other specified disorders of bone density and structure, unspecified site: Secondary | ICD-10-CM | POA: Diagnosis not present

## 2018-09-03 DIAGNOSIS — E119 Type 2 diabetes mellitus without complications: Secondary | ICD-10-CM | POA: Diagnosis not present

## 2018-09-03 DIAGNOSIS — Z853 Personal history of malignant neoplasm of breast: Secondary | ICD-10-CM | POA: Diagnosis not present

## 2018-09-03 DIAGNOSIS — Z0000 Encounter for general adult medical examination without abnormal findings: Secondary | ICD-10-CM | POA: Diagnosis not present

## 2018-09-03 DIAGNOSIS — F411 Generalized anxiety disorder: Secondary | ICD-10-CM | POA: Diagnosis not present

## 2018-09-03 DIAGNOSIS — R001 Bradycardia, unspecified: Secondary | ICD-10-CM | POA: Diagnosis not present

## 2018-09-03 DIAGNOSIS — E7800 Pure hypercholesterolemia, unspecified: Secondary | ICD-10-CM | POA: Diagnosis not present

## 2018-09-10 ENCOUNTER — Other Ambulatory Visit: Payer: Self-pay | Admitting: Hematology

## 2018-09-10 DIAGNOSIS — Z1231 Encounter for screening mammogram for malignant neoplasm of breast: Secondary | ICD-10-CM

## 2018-09-15 ENCOUNTER — Other Ambulatory Visit: Payer: Self-pay

## 2018-09-15 ENCOUNTER — Encounter: Payer: Self-pay | Admitting: Cardiology

## 2018-09-15 ENCOUNTER — Ambulatory Visit (INDEPENDENT_AMBULATORY_CARE_PROVIDER_SITE_OTHER): Payer: Medicare Other | Admitting: Cardiology

## 2018-09-15 VITALS — BP 138/70 | HR 83 | Ht 67.5 in | Wt 149.6 lb

## 2018-09-15 DIAGNOSIS — I493 Ventricular premature depolarization: Secondary | ICD-10-CM | POA: Diagnosis not present

## 2018-09-15 DIAGNOSIS — I10 Essential (primary) hypertension: Secondary | ICD-10-CM

## 2018-09-15 NOTE — Progress Notes (Signed)
Subjective:  Primary Physician:  [name], MD  Patient ID: [name], female    DOB: [birth year], 85 y.o.   MRN: [id number]  Chief Complaint  Patient presents with  . bigemity rhythm  . New Patient (Initial Visit)    HPI: Cindy Wong  is a 85 y.o. female. Patient was found to have ventricular bigeminy recently on the EKG during office visit.  She has been referred for further cardiac evaluation. Patient denies feeling any type of palpitation.  She does not feel any ischemic pain or irregular heartbeat. She has been checking blood pressure at home and sometimes the monitor reveals slow heart rate around 40, minimum heart rate was 39/m.  Patient did not have any dizziness at that time. Most of the times, her heart rates are normal in 60s-70s. She has felt occasional dizziness on standing but no history of near syncope or syncope.  No complaints of chest pain, tightness or pressure.  No shortness of breath, orthopnea or PND.  No history of swelling on the legs and no claudication.  Patient has history of hypertension, diabetes mellitus and hypercholesterolemia.  She does not smoke or drink alcohol. She drinks an average of one cup of coffee per day.  She does not drink any other caffeine containing beverages and no history of taking decongestant medications.  No history of thyroid problems.  No history of TIA or CVA.  Past Medical History:  Diagnosis Date  . Adenomatous colon polyp    tubular  . Anxiety   . Arthritis   . Blood transfusion without reported diagnosis   . Breast cancer ([place])   . Cataract   . Depression   . Diabetes mellitus   . Hx of meningitis 2012  . Hyperlipemia   . Hypertension   . Personal history of radiation therapy   . Stomach cancer ([place])   e=  Past Surgical History:  Procedure Laterality Date  . ABDOMINAL HYSTERECTOMY  1974   Supracervical  . BREAST LUMPECTOMY Right 1993  . BREAST SURGERY     Mastectomy  . CHOLECYSTECTOMY    .  COLONOSCOPY    . MASTECTOMY Right   . meningitis    . OOPHORECTOMY     One ovary removed--?side  . Stomach tumor    . UPPER GASTROINTESTINAL ENDOSCOPY      Social History   Socioeconomic History  . Marital status: Widowed    Spouse name: Not on file  . Number of children: 0  . Years of education: Not on file  . Highest education level: Not on file  Occupational History  . Occupation: retired    Fish farm manager: RETIRED  Social Needs  . Financial resource strain: Not on file  . Food insecurity    Worry: Not on file    Inability: Not on file  . Transportation needs    Medical: Not on file    Non-medical: Not on file  Tobacco Use  . Smoking status: Never Smoker  . Smokeless tobacco: Never Used  Substance and Sexual Activity  . Alcohol use: No    Alcohol/week: 0.0 standard drinks  . Drug use: No  . Sexual activity: Never    Birth control/protection: Surgical    Comment: HYST-1st intercourse 75 yo-5 partners  Lifestyle  . Physical activity    Days per week: Not on file    Minutes per session: Not on file  . Stress: Not on file  Relationships  . Social connections  Talks on phone: Not on file    Gets together: Not on file    Attends religious service: Not on file    Active member of club or organization: Not on file    Attends meetings of clubs or organizations: Not on file    Relationship status: Not on file  . Intimate partner violence    Fear of current or ex partner: Not on file    Emotionally abused: Not on file    Physically abused: Not on file    Forced sexual activity: Not on file  Other Topics Concern  . Not on file  Social History Narrative  . Not on file    Current Outpatient Medications on File Prior to Visit  Medication Sig Dispense Refill  . amLODipine (NORVASC) 5 MG tablet Take 5 mg by mouth daily.    . Cholecalciferol (D2000 ULTRA STRENGTH) 50 MCG (2000 UT) CAPS Take by mouth daily.    Marland Kitchen glipiZIDE (GLUCOTROL XL) 10 MG 24 hr tablet Take 10 mg by  mouth daily.      . hydrochlorothiazide (HYDRODIURIL) 25 MG tablet Take 25 mg by mouth daily.  10  . Linagliptin-Metformin HCl (JENTADUETO) 2.5-500 MG TABS Take 1 tablet by mouth 2 (two) times daily.     Marland Kitchen lisinopril (PRINIVIL,ZESTRIL) 40 MG tablet Take 40 mg by mouth daily.   0  . Multiple Vitamins-Minerals (CENTRUM SILVER PO) Take 1 tablet by mouth daily.     Glory Rosebush VERIO test strip 2 (two) times daily. for testing  2  . potassium chloride SA (K-DUR,KLOR-CON) 20 MEQ tablet Take 20 mEq by mouth daily.    . pravastatin (PRAVACHOL) 40 MG tablet Take 40 mg by mouth every evening.     Marland Kitchen ROYAL JELLY PO Take by mouth daily.     No current facility-administered medications on file prior to visit.     Review of Systems  Constitutional: Negative for fever.  HENT: Negative for nosebleeds.   Eyes: Negative for blurred vision.  Respiratory: Negative for cough and shortness of breath.   Cardiovascular: Negative for chest pain, palpitations, orthopnea, claudication and leg swelling.  Gastrointestinal: Negative for abdominal pain, nausea and vomiting.  Genitourinary: Negative for dysuria.  Musculoskeletal: Negative for myalgias.  Skin: Negative for itching and rash.  Neurological: Negative for dizziness, seizures and loss of consciousness.  Psychiatric/Behavioral: The patient is not nervous/anxious.        Objective:  Blood pressure 138/70, pulse 83, height 5' 7.5" (1.715 m), weight 149 lb 9.6 oz (67.9 kg), SpO2 97 %. Body mass index is 23.08 kg/m.  Physical Exam  Constitutional: She is oriented to person, place, and time. She appears well-developed and well-nourished.  HENT:  Head: Normocephalic and atraumatic.  Eyes: Conjunctivae are normal.  Neck: No thyromegaly present.  Cardiovascular: Normal rate, regular rhythm, S1 normal and S2 normal. Exam reveals no gallop.  Murmur (Gr. 1/6 mid syst. murmur at apex) heard. Pulses:      Carotid pulses are 2+ on the right side and 2+ on the  left side.      Femoral pulses are 2+ on the right side and 2+ on the left side.      Popliteal pulses are 2+ on the right side and 2+ on the left side.       Dorsalis pedis pulses are 2+ on the right side and 2+ on the left side.  Pulmonary/Chest: She has no wheezes. She has no rales.  Abdominal: She exhibits no  mass. There is no abdominal tenderness.  Musculoskeletal:        General: No edema.  Lymphadenopathy:    She has no cervical adenopathy.  Neurological: She is alert and oriented to person, place, and time.  Skin: Skin is warm.    CARDIAC STUDIES:   Assessment & Recommendations:  PVC's (premature ventricular contractions) - Plan: PCV MYOCARDIAL PERFUSION WITH LEXISCAN EKG- Sinus  Rhythm  - PVCs,  LA abnormality Insignificant q waves in inf. leads. Essential hypertension - Plan: EKG 12-Lead, PCV ECHOCARDIOGRAM COMPLETE  Laboratory Exam:  CBC Latest Ref Rng & Units 05/08/2018 11/20/2017 11/28/2016  WBC 4.0 - 10.5 K/uL 11.0(H) 8.0 9.7  Hemoglobin 12.0 - 15.0 g/dL 13.4 14.6 15.5(H)  Hematocrit 36.0 - 46.0 % 43.8 46.4 48.7(H)  Platelets 150 - 400 K/uL 245 273 279   CMP Latest Ref Rng & Units 05/08/2018 11/20/2017 11/28/2016  Glucose 70 - 99 mg/dL 102(H) 131(H) 72  BUN 8 - 23 mg/dL 8 18 14   Creatinine 0.44 - 1.00 mg/dL 1.00 0.89 0.70  Sodium 135 - 145 mmol/L 141 143 141  Potassium 3.5 - 5.1 mmol/L 3.7 3.8 4.3  Chloride 98 - 111 mmol/L 105 104 108  CO2 22 - 32 mmol/L 25 29 25   Calcium 8.9 - 10.3 mg/dL 9.4 10.5(H) 9.7  Total Protein 6.5 - 8.1 g/dL 6.3(L) 7.4 7.3  Total Bilirubin 0.3 - 1.2 mg/dL 1.0 0.4 0.6  Alkaline Phos 38 - 126 U/L 40 53 47  AST 15 - 41 U/L 21 18 23   ALT 0 - 44 U/L 19 15 19    Lipid Panel  No results found for: CHOL, TRIG, HDL, CHOLHDL, VLDL, LDLCALC, LDLDIRECT   Recommendation:  I have discussed my assessment with the patient.  She has PVCs, etiology unclear.  We need to rule out ischemia and assess left ventricular function.  She has been scheduled  for echocardiogram and and a Lexiscan Myoview scans. The bradycardia which patient has noticed on monitor while checking the blood pressure appears to be probably due to bigeminy.  When patient had heart rate of 39/m, she was most likely having bigeminy.  She did not have any associatedsymptoms of dizziness.  Blood pressure is fairly controlled with therapy, I have advised her to continue present medications.  Primary prevention was discussed.  Patient was advised to follow ADA, low-salt, low-cholesterol diet.  She was also encouraged to walk regularly.  Patient was advised to avoid caffeine, decongestants or other stimulants.  I will see her in follow-up after the above tests.  Despina Hick, MD, [facility] 09/15/2018, 11:52 AM [facility] [place] Pager: [phone] Office: [phone] If no answer Cell [phone]

## 2018-09-16 DIAGNOSIS — M1991 Primary osteoarthritis, unspecified site: Secondary | ICD-10-CM | POA: Diagnosis not present

## 2018-09-16 DIAGNOSIS — Z8669 Personal history of other diseases of the nervous system and sense organs: Secondary | ICD-10-CM | POA: Diagnosis not present

## 2018-09-16 DIAGNOSIS — Z9889 Other specified postprocedural states: Secondary | ICD-10-CM | POA: Diagnosis not present

## 2018-09-16 DIAGNOSIS — R51 Headache: Secondary | ICD-10-CM | POA: Diagnosis not present

## 2018-09-16 DIAGNOSIS — S060X0S Concussion without loss of consciousness, sequela: Secondary | ICD-10-CM | POA: Diagnosis not present

## 2018-09-22 ENCOUNTER — Other Ambulatory Visit: Payer: Medicare Other

## 2018-09-29 ENCOUNTER — Other Ambulatory Visit: Payer: Self-pay

## 2018-09-29 ENCOUNTER — Ambulatory Visit (INDEPENDENT_AMBULATORY_CARE_PROVIDER_SITE_OTHER): Payer: Medicare Other

## 2018-09-29 DIAGNOSIS — I493 Ventricular premature depolarization: Secondary | ICD-10-CM

## 2018-10-16 ENCOUNTER — Other Ambulatory Visit: Payer: Self-pay

## 2018-10-16 ENCOUNTER — Encounter: Payer: Self-pay | Admitting: Cardiology

## 2018-10-16 ENCOUNTER — Ambulatory Visit (INDEPENDENT_AMBULATORY_CARE_PROVIDER_SITE_OTHER): Payer: Medicare Other

## 2018-10-16 ENCOUNTER — Ambulatory Visit (INDEPENDENT_AMBULATORY_CARE_PROVIDER_SITE_OTHER): Payer: Medicare Other | Admitting: Cardiology

## 2018-10-16 VITALS — BP 187/83 | HR 42 | Ht 67.0 in | Wt 144.0 lb

## 2018-10-16 DIAGNOSIS — I10 Essential (primary) hypertension: Secondary | ICD-10-CM

## 2018-10-16 DIAGNOSIS — R9439 Abnormal result of other cardiovascular function study: Secondary | ICD-10-CM

## 2018-10-16 DIAGNOSIS — I493 Ventricular premature depolarization: Secondary | ICD-10-CM

## 2018-10-16 MED ORDER — METOPROLOL SUCCINATE ER 25 MG PO TB24: 25.0000 mg | ORAL_TABLET | Freq: Every day | ORAL | 3 refills | Status: DC

## 2018-10-16 NOTE — Progress Notes (Signed)
Follow up visit  Subjective:   Cindy Wong, female    DOB: [birth year], 85 y.o.   MRN: [id number]   Chief Complaint  Patient presents with  . Hypertension  . Follow-up     HPI  85 year old African-American female with controlled hypertension, type 2 diabetes mellitus, hyperlipidemia, seen for frequent PVCs/ventricular bigeminy.  Stress test showed very small size, mild intensity, fixed perfusion defect in basal inferoseptal myocardium. While the defect is small, TID of 1.31 with regional perfusion defect is considered high risk finding. Clinical correlation recommended.  Echocardiogram showed structurally normal heart.   Blood pressure is elevated today, much lower at recent visit. Patient has been anxious today owing to today's appointment. She also endorses certain family stressors. She endorses feeling fatigued and tired, but denies chest pain, shortness of breath, palpitations, leg edema, orthopnea, PND, TIA/syncope.   Past Medical History:  Diagnosis Date  . Adenomatous colon polyp    tubular  . Anxiety   . Arthritis   . Blood transfusion without reported diagnosis   . Breast cancer ([place])   . Cataract   . Depression   . Diabetes mellitus   . Hx of meningitis 2012  . Hyperlipemia   . Hypertension   . Personal history of radiation therapy   . Stomach cancer [facility])      Past Surgical History:  Procedure Laterality Date  . ABDOMINAL HYSTERECTOMY  1974   Supracervical  . BREAST LUMPECTOMY Right 1993  . BREAST SURGERY     Mastectomy  . CHOLECYSTECTOMY    . COLONOSCOPY    . MASTECTOMY Right   . meningitis    . OOPHORECTOMY     One ovary removed--?side  . Stomach tumor    . UPPER GASTROINTESTINAL ENDOSCOPY       Social History   Socioeconomic History  . Marital status: Widowed    Spouse name: Not on file  . Number of children: 0  . Years of education: Not on file  . Highest education level: Not on file  Occupational History  . Occupation: retired     Fish farm manager: RETIRED  Social Needs  . Financial resource strain: Not on file  . Food insecurity    Worry: Not on file    Inability: Not on file  . Transportation needs    Medical: Not on file    Non-medical: Not on file  Tobacco Use  . Smoking status: Never Smoker  . Smokeless tobacco: Never Used  Substance and Sexual Activity  . Alcohol use: No    Alcohol/week: 0.0 standard drinks  . Drug use: No  . Sexual activity: Never    Birth control/protection: Surgical    Comment: HYST-1st intercourse 41 yo-5 partners  Lifestyle  . Physical activity    Days per week: Not on file    Minutes per session: Not on file  . Stress: Not on file  Relationships  . Social Herbalist on phone: Not on file    Gets together: Not on file    Attends religious service: Not on file    Active member of club or organization: Not on file    Attends meetings of clubs or organizations: Not on file    Relationship status: Not on file  . Intimate partner violence    Fear of current or ex partner: Not on file    Emotionally abused: Not on file    Physically abused: Not on file    Forced sexual activity:  Not on file  Other Topics Concern  . Not on file  Social History Narrative  . Not on file     Family History  Problem Relation Age of Onset  . Breast cancer Sister 10  . Diabetes Sister   . Hypertension Sister   . Colon cancer Sister   . Melanoma Sister   . Heart disease Brother   . Diabetes Brother   . Hypertension Brother   . Prostate cancer Brother   . Colon cancer Brother   . Heart failure Brother   . Hypertension Mother   . Stroke Father   . Heart failure Sister   . Kidney disease Sister   . Hypertension Sister   . Cancer Sister   . Heart disease Brother   . Hypertension Brother   . Esophageal cancer Neg Hx   . Rectal cancer Neg Hx   . Stomach cancer Neg Hx      Current Outpatient Medications on File Prior to Visit  Medication Sig Dispense Refill  . amLODipine  (NORVASC) 5 MG tablet Take 5 mg by mouth daily.    . Cholecalciferol (D2000 ULTRA STRENGTH) 50 MCG (2000 UT) CAPS Take by mouth daily.    Marland Kitchen glipiZIDE (GLUCOTROL XL) 10 MG 24 hr tablet Take 10 mg by mouth daily.      . hydrochlorothiazide (HYDRODIURIL) 25 MG tablet Take 25 mg by mouth daily.  10  . Linagliptin-Metformin HCl (JENTADUETO) 2.5-500 MG TABS Take 1 tablet by mouth 2 (two) times daily.     Marland Kitchen lisinopril (PRINIVIL,ZESTRIL) 40 MG tablet Take 40 mg by mouth daily.   0  . Multiple Vitamins-Minerals (CENTRUM SILVER PO) Take 1 tablet by mouth daily.     Glory Rosebush VERIO test strip 2 (two) times daily. for testing  2  . potassium chloride SA (K-DUR,KLOR-CON) 20 MEQ tablet Take 20 mEq by mouth daily.    . pravastatin (PRAVACHOL) 40 MG tablet Take 40 mg by mouth every evening.     Marland Kitchen ROYAL JELLY PO Take by mouth daily.     No current facility-administered medications on file prior to visit.     Cardiovascular studies:  Echocardiogram 10/16/2018: Left ventricle cavity is normal in size. Normal left ventricular wall thickness. Normal LV systolic function with EF 55%. Normal global wall motion. Diastolic function assessment limited due to frequent PVC's. Calculated EF 55%. Trileaflet aortic valve with mild thickening. Trace aortic stenosis. No regurgitation noted. Mild tricuspid regurgitation. Trace pulmonic regurgitation.  No evidence of pulmonary hypertension.  Lexiscan Myoview stress test 09/29/2018: Lexiscan stress test was performed. Stress EKG is non-diagnostic, as this is pharmacological stress test. In addition, rest and stress EKG demonstrate sinus rhythm, frequent PVC's, inferolateral nonspecific ST-T changes.  LVEF 63%. SPECT stress and rest images demonstrate very small size, mild intensity, fixed perfusion defect in basal inferoseptal myocardium. While the defect is small, TID of 1.31 with regional perfusion defect is considered high risk finding. Clinical correlation recommended.    EKG 09/15/2018: Sinus rhythm 77 bpm. Frequent PVC  Carotid US 08/14/2018: Right Carotid: Velocities in the right ICA are consistent with a 1-39% stenosis.                Non-hemodynamically significant plaque <50% noted in the CCA. The                ECA appears <50% stenosed.  Left Carotid: Velocities in the left ICA are consistent with a 1-39% stenosis.  Non-hemodynamically significant plaque noted in the CCA.               Heterogenous multinodular thyroid gland incidentally noted.   Recent labs: Results for HENRIETTE, HESSER (MRN [id number]) as of 10/16/2018 21:10  Ref. Range 05/08/2018 14:11  Sodium Latest Ref Range: 135 - 145 mmol/L 141  Potassium Latest Ref Range: 3.5 - 5.1 mmol/L 3.7  Chloride Latest Ref Range: 98 - 111 mmol/L 105  CO2 Latest Ref Range: 22 - 32 mmol/L 25  Glucose Latest Ref Range: 70 - 99 mg/dL 102 (H)  BUN Latest Ref Range: 8 - 23 mg/dL 8  Creatinine Latest Ref Range: 0.44 - 1.00 mg/dL 1.00  Calcium Latest Ref Range: 8.9 - 10.3 mg/dL 9.4  Anion gap Latest Ref Range: 5 - 15  11  Alkaline Phosphatase Latest Ref Range: 38 - 126 U/L 40  Albumin Latest Ref Range: 3.5 - 5.0 g/dL 3.4 (L)  AST Latest Ref Range: 15 - 41 U/L 21  ALT Latest Ref Range: 0 - 44 U/L 19  Total Protein Latest Ref Range: 6.5 - 8.1 g/dL 6.3 (L)  Total Bilirubin Latest Ref Range: 0.3 - 1.2 mg/dL 1.0  GFR, Est Non African American Latest Ref Range: >60 mL/min 51 (L)  GFR, Est African American Latest Ref Range: >60 mL/min 59 (L)   Results for TENELLE, ANDREASON (MRN [id number]) as of 10/16/2018 21:10  Ref. Range 05/08/2018 14:11  Sodium Latest Ref Range: 135 - 145 mmol/L 141  Potassium Latest Ref Range: 3.5 - 5.1 mmol/L 3.7  Chloride Latest Ref Range: 98 - 111 mmol/L 105  CO2 Latest Ref Range: 22 - 32 mmol/L 25  Glucose Latest Ref Range: 70 - 99 mg/dL 102 (H)  BUN Latest Ref Range: 8 - 23 mg/dL 8  Creatinine Latest Ref Range: 0.44 - 1.00 mg/dL 1.00  Calcium Latest Ref Range: 8.9 -  10.3 mg/dL 9.4  Anion gap Latest Ref Range: 5 - 15  11  Alkaline Phosphatase Latest Ref Range: 38 - 126 U/L 40  Albumin Latest Ref Range: 3.5 - 5.0 g/dL 3.4 (L)  AST Latest Ref Range: 15 - 41 U/L 21  ALT Latest Ref Range: 0 - 44 U/L 19  Total Protein Latest Ref Range: 6.5 - 8.1 g/dL 6.3 (L)  Total Bilirubin Latest Ref Range: 0.3 - 1.2 mg/dL 1.0  GFR, Est Non African American Latest Ref Range: >60 mL/min 51 (L)  GFR, Est African American Latest Ref Range: >60 mL/min 59 (L)    Review of Systems  Constitution: Positive for malaise/fatigue. Negative for decreased appetite, weight gain and weight loss.  HENT: Negative for congestion.   Eyes: Negative for visual disturbance.  Cardiovascular: Negative for chest pain, dyspnea on exertion, leg swelling, palpitations and syncope.  Respiratory: Negative for cough.   Endocrine: Negative for cold intolerance.  Hematologic/Lymphatic: Does not bruise/bleed easily.  Skin: Negative for itching and rash.  Musculoskeletal: Negative for myalgias.  Gastrointestinal: Negative for abdominal pain, nausea and vomiting.  Genitourinary: Negative for dysuria.  Neurological: Negative for dizziness and weakness.  Psychiatric/Behavioral: The patient is not nervous/anxious.   All other systems reviewed and are negative.        Vitals:   10/16/18 1554 10/16/18 1616  BP: (!) 207/53 (!) 187/83  Pulse: (!) 43 (!) 42  SpO2: 100% 99%    Body mass index is 22.55 kg/m. Filed Weights   10/16/18 1554  Weight: 144 lb (65.3 kg)     Objective:  Physical Exam  Constitutional: She is oriented to person, place, and time. She appears well-developed and well-nourished. No distress.  HENT:  Head: Normocephalic and atraumatic.  Eyes: Pupils are equal, round, and reactive to light. Conjunctivae are normal.  Neck: No JVD present.  Cardiovascular: Normal rate, regular rhythm and intact distal pulses.  Extrasystoles are present.  No murmur heard. Pulmonary/Chest:  Effort normal and breath sounds normal. She has no wheezes. She has no rales.  Abdominal: Soft. Bowel sounds are normal. There is no rebound.  Musculoskeletal:        General: No edema.  Lymphadenopathy:    She has no cervical adenopathy.  Neurological: She is alert and oriented to person, place, and time. No cranial nerve deficit.  Skin: Skin is warm and dry.  Psychiatric: She has a normal mood and affect.  Nursing note and vitals reviewed.         Assessment & Recommendations:   85 year old African-American female with controlled hypertension, type 2 diabetes mellitus, hyperlipidemia, seen for frequent PVCs/ventricular bigeminy, abnormal stress test  PVC: Frequent. Structurally normal heart. Stress test shows TID. However, she does not have any angina symptoms at this time. Recommend starting metoprolol succinate 25 mg daily. Given advanced age and absence of angina, do not recommend Aspirin at this time. Okay to continue pravastatin.   I will see her back in 4 weeks to reassess PVC's and blood pressure.   Nigel Mormon, MD [facility] [place] Pager: [phone] Office: [phone] If no answer Cell [phone]

## 2018-10-28 ENCOUNTER — Ambulatory Visit
Admission: RE | Admit: 2018-10-28 | Discharge: 2018-10-28 | Disposition: A | Discharge status: Home / Self Care | Payer: Medicare Other | Source: Ambulatory Visit | Attending: Hematology | Admitting: Hematology

## 2018-10-28 ENCOUNTER — Other Ambulatory Visit: Payer: Self-pay

## 2018-10-28 DIAGNOSIS — Z1231 Encounter for screening mammogram for malignant neoplasm of breast: Secondary | ICD-10-CM | POA: Diagnosis not present

## 2018-11-17 ENCOUNTER — Ambulatory Visit: Payer: Medicare Other | Admitting: Cardiology

## 2018-11-18 ENCOUNTER — Ambulatory Visit: Payer: Medicare Other | Admitting: Cardiology

## 2018-11-18 NOTE — Progress Notes (Signed)
[place]   [phone] [phone]   Clinic Follow up Note   Patient Care Team: Tisovec, Fransico Him, MD as PCP - General  Date of Service:  11/21/2018  CHIEF COMPLAINT: Ductal carcinoma in situ  (DCIS) of the right breast and a history of gastrointestinal stromal tumor (GIST)  Prior Therapy: DCIS right breast (1993) treated with lumpectomy and radiation treatments.  She  developed recurrent DCIS in 08/03/2005 and  underwent a right-sided mastectomy on 08/24/2005.  GIST involving the stomach s/p partial gastrectomy on 03/24/1995 without evidence of recurrence.  Current therapy: Surveillance  INTERVAL HISTORY:  Cindy Wong is here for a follow up right breast DCIS and GIST. She was last seen by me 1 year ago. She presents to the clinic alone. She notes since March she has felt less than baseline. She notes she recovered from Pharyngitis that she had in 04/2018. She plans to see her latest Cardiologist today after some cardiac workup. She last ECHO was normal.  She notes her BP was 111/72 and HR 45. She notes her BP in clinic this morning shows 187/81. She is on low dose Coreg. She denies feeling dizzy today but did yesterday. She is also on Carvedilol, HCTZ, amlodipine, Lisinopril.  She notes she lost her brother and sister this year. She does live alone still. She feels she can still adequately take care of herself.     REVIEW OF SYSTEMS:   Constitutional: Denies fevers, chills or abnormal weight loss Eyes: Denies blurriness of vision Ears, nose, mouth, throat, and face: Denies mucositis or sore throat Respiratory: Denies cough, dyspnea or wheezes Cardiovascular: Denies palpitation, chest discomfort or lower extremity swelling Gastrointestinal:  Denies nausea, heartburn or change in bowel habits Skin: Denies abnormal skin rashes Lymphatics: Denies new lymphadenopathy or easy bruising Neurological:Denies numbness, tingling or new weaknesses  Behavioral/Psych: Mood is stable, no new changes  All other systems were reviewed with the patient and are negative.  MEDICAL HISTORY:  Past Medical History:  Diagnosis Date  . Adenomatous colon polyp    tubular  . Anxiety   . Arthritis   . Blood transfusion without reported diagnosis   . Breast cancer ([place])   . Cataract   . Depression   . Diabetes mellitus   . Hx of meningitis 2012  . Hyperlipemia   . Hypertension   . Personal history of radiation therapy   . Stomach cancer [facility])     SURGICAL HISTORY: Past Surgical History:  Procedure Laterality Date  . ABDOMINAL HYSTERECTOMY  1974   Supracervical  . BREAST LUMPECTOMY Right 1993  . BREAST SURGERY     Mastectomy  . CHOLECYSTECTOMY    . COLONOSCOPY    . MASTECTOMY Right   . meningitis    . OOPHORECTOMY     One ovary removed--?side  . Stomach tumor    . UPPER GASTROINTESTINAL ENDOSCOPY      I have reviewed the social history and family history with the patient and they are unchanged from previous note.  ALLERGIES:  is allergic to sulfonamide derivatives and sulfamethoxazole.  MEDICATIONS:  Current Outpatient Medications  Medication Sig Dispense Refill  . amLODipine (NORVASC) 5 MG tablet Take 5 mg by mouth daily.    . carvedilol (COREG) 6.25 MG tablet Take 1 tablet (6.25 mg total) by mouth 2 (two) times daily. 60 tablet 3  . Cholecalciferol (D2000 ULTRA STRENGTH) 50 MCG (2000 UT) CAPS Take by mouth daily.    Marland Kitchen glipiZIDE (GLUCOTROL  XL) 10 MG 24 hr tablet Take 10 mg by mouth daily.      . hydrochlorothiazide (HYDRODIURIL) 25 MG tablet Take 25 mg by mouth daily.  10  . Linagliptin-Metformin HCl (JENTADUETO) 2.5-500 MG TABS Take 1 tablet by mouth 2 (two) times daily.     Marland Kitchen lisinopril (PRINIVIL,ZESTRIL) 40 MG tablet Take 40 mg by mouth daily.   0  . Multiple Vitamins-Minerals (CENTRUM SILVER PO) Take 1 tablet by mouth daily.     Glory Rosebush VERIO test strip 2 (two) times daily. for testing  2  . potassium chloride SA  (K-DUR,KLOR-CON) 20 MEQ tablet Take 20 mEq by mouth daily.    . pravastatin (PRAVACHOL) 40 MG tablet Take 40 mg by mouth every evening.     Marland Kitchen ROYAL JELLY PO Take by mouth daily.     No current facility-administered medications for this visit.     PHYSICAL EXAMINATION: ECOG PERFORMANCE STATUS: 1 - Symptomatic but completely ambulatory  Vitals:   11/21/18 1051 11/21/18 1113  BP: (!) 187/81 (!) 144/97  Pulse: 65   Resp: 17   Temp: 98 F (36.7 C)   SpO2: 100%    Filed Weights   11/21/18 1051  Weight: 145 lb 9.6 oz (66 kg)    GENERAL:alert, no distress and comfortable SKIN: skin color, texture, turgor are normal, no rashes or significant lesions (+) Multiple moles of chest and left abdomen  EYES: normal, Conjunctiva are pink and non-injected, sclera clear  NECK: supple, thyroid normal size, non-tender, without nodularity LYMPH:  no palpable lymphadenopathy in the cervical, axillary  LUNGS: clear to auscultation and percussion with normal breathing effort HEART: regular rate & rhythm and no murmurs and no lower extremity edema ABDOMEN:abdomen soft, non-tender and normal bowel sounds (+) Multiple surgical incisions of abdomen Musculoskeletal:no cyanosis of digits and no clubbing  NEURO: alert & oriented x 3 with fluent speech, no focal motor/sensory deficits BREAST: S/p right mastectomy, breast surgically absent. (+) right axillary soft tissue. No palpable mass, nodules or adenopathy bilaterally. Breast exam benign.   LABORATORY DATA:  I have reviewed the data as listed CBC Latest Ref Rng & Units 11/21/2018 05/08/2018 11/20/2017  WBC 4.0 - 10.5 K/uL 7.3 11.0(H) 8.0  Hemoglobin 12.0 - 15.0 g/dL 15.3(H) 13.4 14.6  Hematocrit 36.0 - 46.0 % 48.9(H) 43.8 46.4  Platelets 150 - 400 K/uL 268 245 273     CMP Latest Ref Rng & Units 05/08/2018 11/20/2017 11/28/2016  Glucose 70 - 99 mg/dL 102(H) 131(H) 72  BUN 8 - 23 mg/dL 8 18 14   Creatinine 0.44 - 1.00 mg/dL 1.00 0.89 0.70  Sodium 135 -  145 mmol/L 141 143 141  Potassium 3.5 - 5.1 mmol/L 3.7 3.8 4.3  Chloride 98 - 111 mmol/L 105 104 108  CO2 22 - 32 mmol/L 25 29 25   Calcium 8.9 - 10.3 mg/dL 9.4 10.5(H) 9.7  Total Protein 6.5 - 8.1 g/dL 6.3(L) 7.4 7.3  Total Bilirubin 0.3 - 1.2 mg/dL 1.0 0.4 0.6  Alkaline Phos 38 - 126 U/L 40 53 47  AST 15 - 41 U/L 21 18 23   ALT 0 - 44 U/L 19 15 19       RADIOGRAPHIC STUDIES: I have personally reviewed the radiological images as listed and agreed with the findings in the report. No results found.   ASSESSMENT & PLAN:  Elyana Grabski is a 85 y.o. female with   1. History of recurrent right breast DCIS -DCIS right breast (1993) treated with  lumpectomy and radiation treatments. She developed recurrent DCIS in 08/03/2005 and  underwent a right-sided mastectomy on 08/24/2005.  -She underwent a left breast biopsy in March 2017, which was negative. Her last mammogram in 09/2017 was unremarkable -Although she is 62, she is fairly in good health, we'll continue annual screening left breast mammogram.  -I previously encouraged her to continue healthy diet and regular exercise. She is physically active -She is clinically stable. Labs reviewed, CBC and CMP WNL except Hg 15.3. Physical exam today benign. 10/2018 Left mammogram normal. There is no clinical concern for recurrence.  -I discussed with elevated Hg she is likely dehydrated. She never smoked. I strongly encouraged her to drink more water.  -Continue surveillance. Next  Left mammogram in 10/2019 -F/u in 1 year -She opted to receive flu shot today   2. History of gastric GIST  -s/p partial gastrectomy on 03/24/1995  -She is clinically doing well, no evidence of recurrence -No need for routine scan surveillance, will follow up clinically.  3. Irregular heart beat, bradycardia  -per pt, she has noticed low hart rate in the mornings, in 50's  -had EKG in 2019 with [facility] Dr. Osborne Casco, she denies any chest discomfort  -Will  F/u with her PCP  4. Fatigue  -she report more fatigued since March 2020 -recent echo normal. Not sure part of aging vs underline medical condition related  -f/u with PCP and cardiology  5. HTN -Per patient her BP was 111/72 with HR 45. She denies dizziness this morning.  -When she arrived this morning her BP was 187/81 and HR in normal range. Will repeat BP in clinic today (11/21/18) -She is on Coreg, Carvedilol, HCTZ, amlodipine, Lisinopril. I encouraged her to review medication dosage this with her cardiologist.   6. Multiple Skin Moles  -Across chest and of left abdomen as seen on exam today (11/21/18) -I encouraged her to continue to f/u with her Dermatologist about these.   Follow up -Flu shot today  -She is clinically stable  -I'll see her back in one year with labs   No problem-specific Assessment & Plan notes found for this encounter.   Orders Placed This Encounter  Procedures  . MM Digital Screening Unilat L    Standing Status:   Future    Standing Expiration Date:   11/21/2019    Order Specific Question:   Reason for Exam (SYMPTOM  OR DIAGNOSIS REQUIRED)    Answer:   screening    Order Specific Question:   Preferred imaging location?    Answer:   [facility]   All questions were answered. The patient knows to call the clinic with any problems, questions or concerns. No barriers to learning was detected. I spent 15 minutes counseling the patient face to face. The total time spent in the appointment was 20 minutes and more than 50% was on counseling and review of test results     Truitt Merle, MD 11/21/2018   I, Joslyn Devon, am acting as scribe for Truitt Merle, MD.   I have reviewed the above documentation for accuracy and completeness, and I agree with the above.

## 2018-11-19 ENCOUNTER — Ambulatory Visit: Payer: Medicare Other | Admitting: Cardiology

## 2018-11-20 ENCOUNTER — Encounter: Payer: Self-pay | Admitting: Cardiology

## 2018-11-20 ENCOUNTER — Other Ambulatory Visit: Payer: Self-pay

## 2018-11-20 ENCOUNTER — Ambulatory Visit (INDEPENDENT_AMBULATORY_CARE_PROVIDER_SITE_OTHER): Payer: Medicare Other | Admitting: Cardiology

## 2018-11-20 VITALS — BP 142/67 | HR 50 | Temp 94.2°F | Ht 67.0 in | Wt 145.0 lb

## 2018-11-20 DIAGNOSIS — I10 Essential (primary) hypertension: Secondary | ICD-10-CM | POA: Diagnosis not present

## 2018-11-20 DIAGNOSIS — I493 Ventricular premature depolarization: Secondary | ICD-10-CM

## 2018-11-20 MED ORDER — CARVEDILOL 6.25 MG PO TABS: 6.2500 mg | ORAL_TABLET | Freq: Two times a day (BID) | ORAL | 3 refills | Status: DC

## 2018-11-20 NOTE — Progress Notes (Signed)
Follow up visit  Subjective:   Cindy Wong, female    DOB: 1931-11-13, 85 y.o.   MRN: [id number]   Chief Complaint  Patient presents with  . Bradycardia  . Follow-up    4 weeks     HPI  85 year old African-American female with controlled hypertension, type 2 diabetes mellitus, hyperlipidemia, seen for frequent PVCs/ventricular bigeminy.  Stress test showed very small size, mild intensity, fixed perfusion defect in basal inferoseptal myocardium. While the defect is small, TID of 1.31 with regional perfusion defect is considered high risk finding. Clinical correlation recommended.  Echocardiogram showed structurally normal heart.   Patient has recently been under a lot of stress after losing her older brother at 49. While she denies any chest pain, she states she just "doesn't feel good". Blood pressure is generally elevated.   Past Medical History:  Diagnosis Date  . Adenomatous colon polyp    tubular  . Anxiety   . Arthritis   . Blood transfusion without reported diagnosis   . Breast cancer ([place])   . Cataract   . Depression   . Diabetes mellitus   . Hx of meningitis 2012  . Hyperlipemia   . Hypertension   . Personal history of radiation therapy   . Stomach cancer [facility])      Past Surgical History:  Procedure Laterality Date  . ABDOMINAL HYSTERECTOMY  1974   Supracervical  . BREAST LUMPECTOMY Right 1993  . BREAST SURGERY     Mastectomy  . CHOLECYSTECTOMY    . COLONOSCOPY    . MASTECTOMY Right   . meningitis    . OOPHORECTOMY     One ovary removed--?side  . Stomach tumor    . UPPER GASTROINTESTINAL ENDOSCOPY       Social History   Socioeconomic History  . Marital status: Widowed    Spouse name: Not on file  . Number of children: 0  . Years of education: Not on file  . Highest education level: Not on file  Occupational History  . Occupation: retired    Fish farm manager: RETIRED  Social Needs  . Financial resource strain: Not on file  . Food  insecurity    Worry: Not on file    Inability: Not on file  . Transportation needs    Medical: Not on file    Non-medical: Not on file  Tobacco Use  . Smoking status: Never Smoker  . Smokeless tobacco: Never Used  Substance and Sexual Activity  . Alcohol use: No    Alcohol/week: 0.0 standard drinks  . Drug use: No  . Sexual activity: Never    Birth control/protection: Surgical    Comment: HYST-1st intercourse 71 yo-5 partners  Lifestyle  . Physical activity    Days per week: Not on file    Minutes per session: Not on file  . Stress: Not on file  Relationships  . Social Herbalist on phone: Not on file    Gets together: Not on file    Attends religious service: Not on file    Active member of club or organization: Not on file    Attends meetings of clubs or organizations: Not on file    Relationship status: Not on file  . Intimate partner violence    Fear of current or ex partner: Not on file    Emotionally abused: Not on file    Physically abused: Not on file    Forced sexual activity: Not on file  Other  Topics Concern  . Not on file  Social History Narrative  . Not on file     Family History  Problem Relation Age of Onset  . Breast cancer Sister 47  . Diabetes Sister   . Hypertension Sister   . Colon cancer Sister   . Melanoma Sister   . Heart disease Brother   . Diabetes Brother   . Hypertension Brother   . Prostate cancer Brother   . Colon cancer Brother   . Heart failure Brother   . Hypertension Mother   . Stroke Father   . Heart failure Sister   . Kidney disease Sister   . Hypertension Sister   . Cancer Sister   . Heart disease Brother   . Hypertension Brother   . Esophageal cancer Neg Hx   . Rectal cancer Neg Hx   . Stomach cancer Neg Hx      Current Outpatient Medications on File Prior to Visit  Medication Sig Dispense Refill  . amLODipine (NORVASC) 5 MG tablet Take 5 mg by mouth daily.    . Cholecalciferol (D2000 ULTRA STRENGTH)  50 MCG (2000 UT) CAPS Take by mouth daily.    Marland Kitchen glipiZIDE (GLUCOTROL XL) 10 MG 24 hr tablet Take 10 mg by mouth daily.      . hydrochlorothiazide (HYDRODIURIL) 25 MG tablet Take 25 mg by mouth daily.  10  . Linagliptin-Metformin HCl (JENTADUETO) 2.5-500 MG TABS Take 1 tablet by mouth 2 (two) times daily.     Marland Kitchen lisinopril (PRINIVIL,ZESTRIL) 40 MG tablet Take 40 mg by mouth daily.   0  . metoprolol succinate (TOPROL XL) 25 MG 24 hr tablet Take 1 tablet (25 mg total) by mouth daily. 30 tablet 3  . Multiple Vitamins-Minerals (CENTRUM SILVER PO) Take 1 tablet by mouth daily.     Glory Rosebush VERIO test strip 2 (two) times daily. for testing  2  . potassium chloride SA (K-DUR,KLOR-CON) 20 MEQ tablet Take 20 mEq by mouth daily.    . pravastatin (PRAVACHOL) 40 MG tablet Take 40 mg by mouth every evening.     Marland Kitchen ROYAL JELLY PO Take by mouth daily.     No current facility-administered medications on file prior to visit.     Cardiovascular studies:  EKG 11/20/2018: Sinus rhythm 74 bpm.  Frequent pvcs -ventricular trigeminy. Left atrial enlargement. No significant change compared to previous EKG.  Echocardiogram 10/16/2018: Left ventricle cavity is normal in size. Normal left ventricular wall thickness. Normal LV systolic function with EF 55%. Normal global wall motion. Diastolic function assessment limited due to frequent PVC's. Calculated EF 55%. Trileaflet aortic valve with mild thickening. Trace aortic stenosis. No regurgitation noted. Mild tricuspid regurgitation. Trace pulmonic regurgitation.  No evidence of pulmonary hypertension.  Lexiscan Myoview stress test 09/29/2018: Lexiscan stress test was performed. Stress EKG is non-diagnostic, as this is pharmacological stress test. In addition, rest and stress EKG demonstrate sinus rhythm, frequent PVC's, inferolateral nonspecific ST-T changes.  LVEF 63%. SPECT stress and rest images demonstrate very small size, mild intensity, fixed perfusion defect  in basal inferoseptal myocardium. While the defect is small, TID of 1.31 with regional perfusion defect is considered high risk finding. Clinical correlation recommended.   EKG 09/15/2018: Sinus rhythm 77 bpm. Frequent PVC  Carotid US 08/14/2018: Right Carotid: Velocities in the right ICA are consistent with a 1-39% stenosis.                Non-hemodynamically significant plaque <50% noted in the CCA.  The                ECA appears <50% stenosed.  Left Carotid: Velocities in the left ICA are consistent with a 1-39% stenosis.               Non-hemodynamically significant plaque noted in the CCA.               Heterogenous multinodular thyroid gland incidentally noted.   Recent labs: Results for LUCIE, FRIEDLANDER (MRN [id number]) as of 10/16/2018 21:10  Ref. Range 05/08/2018 14:11  Sodium Latest Ref Range: 135 - 145 mmol/L 141  Potassium Latest Ref Range: 3.5 - 5.1 mmol/L 3.7  Chloride Latest Ref Range: 98 - 111 mmol/L 105  CO2 Latest Ref Range: 22 - 32 mmol/L 25  Glucose Latest Ref Range: 70 - 99 mg/dL 102 (H)  BUN Latest Ref Range: 8 - 23 mg/dL 8  Creatinine Latest Ref Range: 0.44 - 1.00 mg/dL 1.00  Calcium Latest Ref Range: 8.9 - 10.3 mg/dL 9.4  Anion gap Latest Ref Range: 5 - 15  11  Alkaline Phosphatase Latest Ref Range: 38 - 126 U/L 40  Albumin Latest Ref Range: 3.5 - 5.0 g/dL 3.4 (L)  AST Latest Ref Range: 15 - 41 U/L 21  ALT Latest Ref Range: 0 - 44 U/L 19  Total Protein Latest Ref Range: 6.5 - 8.1 g/dL 6.3 (L)  Total Bilirubin Latest Ref Range: 0.3 - 1.2 mg/dL 1.0  GFR, Est Non African American Latest Ref Range: >60 mL/min 51 (L)  GFR, Est African American Latest Ref Range: >60 mL/min 59 (L)   Results for ASTRID, VIDES (MRN [id number]) as of 10/16/2018 21:10  Ref. Range 05/08/2018 14:11  Sodium Latest Ref Range: 135 - 145 mmol/L 141  Potassium Latest Ref Range: 3.5 - 5.1 mmol/L 3.7  Chloride Latest Ref Range: 98 - 111 mmol/L 105  CO2 Latest Ref Range: 22 - 32 mmol/L 25   Glucose Latest Ref Range: 70 - 99 mg/dL 102 (H)  BUN Latest Ref Range: 8 - 23 mg/dL 8  Creatinine Latest Ref Range: 0.44 - 1.00 mg/dL 1.00  Calcium Latest Ref Range: 8.9 - 10.3 mg/dL 9.4  Anion gap Latest Ref Range: 5 - 15  11  Alkaline Phosphatase Latest Ref Range: 38 - 126 U/L 40  Albumin Latest Ref Range: 3.5 - 5.0 g/dL 3.4 (L)  AST Latest Ref Range: 15 - 41 U/L 21  ALT Latest Ref Range: 0 - 44 U/L 19  Total Protein Latest Ref Range: 6.5 - 8.1 g/dL 6.3 (L)  Total Bilirubin Latest Ref Range: 0.3 - 1.2 mg/dL 1.0  GFR, Est Non African American Latest Ref Range: >60 mL/min 51 (L)  GFR, Est African American Latest Ref Range: >60 mL/min 59 (L)    Review of Systems  Constitution: Positive for malaise/fatigue. Negative for decreased appetite, weight gain and weight loss.  HENT: Negative for congestion.   Eyes: Negative for visual disturbance.  Cardiovascular: Negative for chest pain, dyspnea on exertion, leg swelling, palpitations and syncope.  Respiratory: Negative for cough.   Endocrine: Negative for cold intolerance.  Hematologic/Lymphatic: Does not bruise/bleed easily.  Skin: Negative for itching and rash.  Musculoskeletal: Negative for myalgias.  Gastrointestinal: Negative for abdominal pain, nausea and vomiting.  Genitourinary: Negative for dysuria.  Neurological: Negative for dizziness and weakness.  Psychiatric/Behavioral: The patient is not nervous/anxious.   All other systems reviewed and are negative.        There were  no vitals filed for this visit.  There is no height or weight on file to calculate BMI. There were no vitals filed for this visit.   Objective:   Physical Exam  Constitutional: She is oriented to person, place, and time. She appears well-developed and well-nourished. No distress.  HENT:  Head: Normocephalic and atraumatic.  Eyes: Pupils are equal, round, and reactive to light. Conjunctivae are normal.  Neck: No JVD present.  Cardiovascular: Normal  rate, regular rhythm and intact distal pulses.  Extrasystoles are present.  No murmur heard. Pulmonary/Chest: Effort normal and breath sounds normal. She has no wheezes. She has no rales.  Abdominal: Soft. Bowel sounds are normal. There is no rebound.  Musculoskeletal:        General: No edema.  Lymphadenopathy:    She has no cervical adenopathy.  Neurological: She is alert and oriented to person, place, and time. No cranial nerve deficit.  Skin: Skin is warm and dry.  Psychiatric: She has a normal mood and affect.  Nursing note and vitals reviewed.         Assessment & Recommendations:   85 year old African-American female with controlled hypertension, type 2 diabetes mellitus, hyperlipidemia, seen for frequent PVCs/ventricular bigeminy, abnormal stress test  Frequent PVC, hypertension: Reason for her perceived bradycardia. Will switch metoprolol succinate 25 mg daily to carvedilol 6.25 mg bid for better blood pressure control. Frequent. Structurally normal heart. Stress test shows TID. However, she does not have any angina symptoms at this time.  Given advanced age and absence of angina, do not recommend Aspirin at this time. Okay to continue pravastatin.   Fatigue: Suspect grief reaction. Need to look for progression to depression.   Nigel Mormon, MD [facility] Pager: [phone] Office: [phone] If no answer Cell [phone]

## 2018-11-21 ENCOUNTER — Encounter: Payer: Self-pay | Admitting: Hematology

## 2018-11-21 ENCOUNTER — Other Ambulatory Visit: Payer: Self-pay

## 2018-11-21 ENCOUNTER — Inpatient Hospital Stay: Payer: Medicare Other | Attending: Hematology

## 2018-11-21 ENCOUNTER — Inpatient Hospital Stay (HOSPITAL_BASED_OUTPATIENT_CLINIC_OR_DEPARTMENT_OTHER): Payer: Medicare Other | Admitting: Hematology

## 2018-11-21 VITALS — BP 144/97 | HR 65 | Temp 98.0°F | Resp 17 | Ht 67.0 in | Wt 145.6 lb

## 2018-11-21 DIAGNOSIS — Z1231 Encounter for screening mammogram for malignant neoplasm of breast: Secondary | ICD-10-CM | POA: Diagnosis not present

## 2018-11-21 DIAGNOSIS — D0511 Intraductal carcinoma in situ of right breast: Secondary | ICD-10-CM | POA: Insufficient documentation

## 2018-11-21 DIAGNOSIS — Z85028 Personal history of other malignant neoplasm of stomach: Secondary | ICD-10-CM | POA: Diagnosis not present

## 2018-11-21 DIAGNOSIS — Z90721 Acquired absence of ovaries, unilateral: Secondary | ICD-10-CM | POA: Insufficient documentation

## 2018-11-21 DIAGNOSIS — M1990 Unspecified osteoarthritis, unspecified site: Secondary | ICD-10-CM | POA: Diagnosis not present

## 2018-11-21 DIAGNOSIS — Z923 Personal history of irradiation: Secondary | ICD-10-CM | POA: Diagnosis not present

## 2018-11-21 DIAGNOSIS — Z903 Acquired absence of stomach [part of]: Secondary | ICD-10-CM | POA: Insufficient documentation

## 2018-11-21 DIAGNOSIS — R5383 Other fatigue: Secondary | ICD-10-CM | POA: Insufficient documentation

## 2018-11-21 DIAGNOSIS — Z882 Allergy status to sulfonamides status: Secondary | ICD-10-CM | POA: Diagnosis not present

## 2018-11-21 DIAGNOSIS — Z86010 Personal history of colonic polyps: Secondary | ICD-10-CM | POA: Diagnosis not present

## 2018-11-21 DIAGNOSIS — Z23 Encounter for immunization: Secondary | ICD-10-CM

## 2018-11-21 DIAGNOSIS — R001 Bradycardia, unspecified: Secondary | ICD-10-CM | POA: Diagnosis not present

## 2018-11-21 DIAGNOSIS — Z9011 Acquired absence of right breast and nipple: Secondary | ICD-10-CM | POA: Diagnosis not present

## 2018-11-21 DIAGNOSIS — I10 Essential (primary) hypertension: Secondary | ICD-10-CM | POA: Diagnosis not present

## 2018-11-21 DIAGNOSIS — Z79899 Other long term (current) drug therapy: Secondary | ICD-10-CM | POA: Diagnosis not present

## 2018-11-21 DIAGNOSIS — Z86000 Personal history of in-situ neoplasm of breast: Secondary | ICD-10-CM

## 2018-11-21 DIAGNOSIS — C49A2 Gastrointestinal stromal tumor of stomach: Secondary | ICD-10-CM

## 2018-11-21 LAB — CBC WITH DIFFERENTIAL/PLATELET
Abs Immature Granulocytes: 0.05 10*3/uL (ref 0.00–0.07)
Basophils Absolute: 0.1 10*3/uL (ref 0.0–0.1)
Basophils Relative: 1 %
Eosinophils Absolute: 0.1 10*3/uL (ref 0.0–0.5)
Eosinophils Relative: 2 %
HCT: 48.9 % — ABNORMAL HIGH (ref 36.0–46.0)
Hemoglobin: 15.3 g/dL — ABNORMAL HIGH (ref 12.0–15.0)
Immature Granulocytes: 1 %
Lymphocytes Relative: 31 %
Lymphs Abs: 2.3 10*3/uL (ref 0.7–4.0)
MCH: 26.7 pg (ref 26.0–34.0)
MCHC: 31.3 g/dL (ref 30.0–36.0)
MCV: 85.5 fL (ref 80.0–100.0)
Monocytes Absolute: 0.5 10*3/uL (ref 0.1–1.0)
Monocytes Relative: 7 %
Neutro Abs: 4.3 10*3/uL (ref 1.7–7.7)
Neutrophils Relative %: 58 %
Platelets: 268 10*3/uL (ref 150–400)
RBC: 5.72 MIL/uL — ABNORMAL HIGH (ref 3.87–5.11)
RDW: 13 % (ref 11.5–15.5)
WBC: 7.3 10*3/uL (ref 4.0–10.5)
nRBC: 0 % (ref 0.0–0.2)

## 2018-11-21 LAB — COMPREHENSIVE METABOLIC PANEL
ALT: 13 U/L (ref 0–44)
AST: 18 U/L (ref 15–41)
Albumin: 4.2 g/dL (ref 3.5–5.0)
Alkaline Phosphatase: 55 U/L (ref 38–126)
Anion gap: 11 (ref 5–15)
BUN: 18 mg/dL (ref 8–23)
CO2: 25 mmol/L (ref 22–32)
Calcium: 10.1 mg/dL (ref 8.9–10.3)
Chloride: 106 mmol/L (ref 98–111)
Creatinine, Ser: 0.89 mg/dL (ref 0.44–1.00)
GFR calc Af Amer: >60 mL/min (ref >60)
GFR calc non Af Amer: 59 mL/min — ABNORMAL LOW (ref >60)
Glucose, Bld: 101 mg/dL — ABNORMAL HIGH (ref 70–99)
Potassium: 4.1 mmol/L (ref 3.5–5.1)
Sodium: 142 mmol/L (ref 135–145)
Total Bilirubin: 0.5 mg/dL (ref 0.3–1.2)
Total Protein: 7.3 g/dL (ref 6.5–8.1)

## 2018-11-21 MED ORDER — INFLUENZA VAC A&B SA ADJ QUAD 0.5 ML IM PRSY
PREFILLED_SYRINGE | INTRAMUSCULAR | Status: AC
  Filled 2018-11-21: qty 0.5

## 2018-11-21 MED ORDER — INFLUENZA VAC A&B SA ADJ QUAD 0.5 ML IM PRSY
0.5000 mL | PREFILLED_SYRINGE | Freq: Once | INTRAMUSCULAR | Status: AC
  Administered 2018-11-21: 11:00:00 0.5 mL via INTRAMUSCULAR

## 2018-11-24 ENCOUNTER — Telehealth: Payer: Self-pay | Admitting: Hematology

## 2018-11-24 NOTE — Telephone Encounter (Signed)
Scheduled appt per 10/2 los.  Sent a staff message to get a calendar mailed out.

## 2018-11-27 ENCOUNTER — Encounter: Payer: Self-pay | Admitting: Gynecology

## 2018-12-01 ENCOUNTER — Encounter: Payer: Self-pay | Admitting: Podiatry

## 2018-12-01 ENCOUNTER — Other Ambulatory Visit: Payer: Self-pay

## 2018-12-01 ENCOUNTER — Ambulatory Visit (INDEPENDENT_AMBULATORY_CARE_PROVIDER_SITE_OTHER): Payer: Medicare Other | Admitting: Podiatry

## 2018-12-01 DIAGNOSIS — L600 Ingrowing nail: Secondary | ICD-10-CM

## 2018-12-01 DIAGNOSIS — M79671 Pain in right foot: Secondary | ICD-10-CM

## 2018-12-01 DIAGNOSIS — M79676 Pain in unspecified toe(s): Secondary | ICD-10-CM

## 2018-12-01 DIAGNOSIS — E1151 Type 2 diabetes mellitus with diabetic peripheral angiopathy without gangrene: Secondary | ICD-10-CM

## 2018-12-01 DIAGNOSIS — M79672 Pain in left foot: Secondary | ICD-10-CM

## 2018-12-01 DIAGNOSIS — B351 Tinea unguium: Secondary | ICD-10-CM | POA: Diagnosis not present

## 2018-12-01 NOTE — Progress Notes (Signed)
Subjective:  Patient ID: Cindy Wong, female    DOB: [birth year],  MRN: [id number]  Chief Complaint  Patient presents with  . Foot Pain    pt is here for toenail pain of the right great toenail medial side    85 y.o. female presents with the above complaint.  She states that this pain started out of nowhere on the right medial border of the hallux.  She also has secondary complaints of painful onychomycotic toenails x10.  She denies any other acute complaints.   Review of Systems: Negative except as noted in the HPI. Denies N/V/F/Ch.  Past Medical History:  Diagnosis Date  . Adenomatous colon polyp    tubular  . Anxiety   . Arthritis   . Blood transfusion without reported diagnosis   . Breast cancer ([place])   . Cataract   . Depression   . Diabetes mellitus   . Hx of meningitis 2012  . Hyperlipemia   . Hypertension   . Personal history of radiation therapy   . Stomach cancer ([place])     Current Outpatient Medications:  .  amLODipine (NORVASC) 5 MG tablet, Take 5 mg by mouth daily., Disp: , Rfl:  .  carvedilol (COREG) 6.25 MG tablet, Take 1 tablet (6.25 mg total) by mouth 2 (two) times daily., Disp: 60 tablet, Rfl: 3 .  Cholecalciferol (D2000 ULTRA STRENGTH) 50 MCG (2000 UT) CAPS, Take by mouth daily., Disp: , Rfl:  .  glipiZIDE (GLUCOTROL XL) 10 MG 24 hr tablet, Take 10 mg by mouth daily.  , Disp: , Rfl:  .  hydrochlorothiazide (HYDRODIURIL) 25 MG tablet, Take 25 mg by mouth daily., Disp: , Rfl: 10 .  Linagliptin-Metformin HCl (JENTADUETO) 2.5-500 MG TABS, Take 1 tablet by mouth 2 (two) times daily. , Disp: , Rfl:  .  lisinopril (PRINIVIL,ZESTRIL) 40 MG tablet, Take 40 mg by mouth daily. , Disp: , Rfl: 0 .  Multiple Vitamins-Minerals (CENTRUM SILVER PO), Take 1 tablet by mouth daily. , Disp: , Rfl:  .  ONETOUCH VERIO test strip, 2 (two) times daily. for testing, Disp: , Rfl: 2 .  potassium chloride SA (K-DUR,KLOR-CON) 20 MEQ tablet, Take 20 mEq by mouth daily., Disp: ,  Rfl:  .  pravastatin (PRAVACHOL) 40 MG tablet, Take 40 mg by mouth every evening. , Disp: , Rfl:  .  ROYAL JELLY PO, Take by mouth daily., Disp: , Rfl:   Social History   Tobacco Use  Smoking Status Never Smoker  Smokeless Tobacco Never Used    Allergies  Allergen Reactions  . Sulfonamide Derivatives Hives and Itching  . Sulfamethoxazole Rash   Objective:  There were no vitals filed for this visit. There is no height or weight on file to calculate BMI. Constitutional Well developed. Well nourished.  Vascular Dorsalis pedis diminished bilaterally Posterior tibial pulses palpable bilaterally. Capillary refill normal to all digits.  No cyanosis or clubbing noted. No Hair growth. Skin thin, shiny and atrophic b/l   Neurologic Normal speech. Oriented to person, place, and time. Epicritic sensation to light touch grossly present bilaterally.  Dermatologic Painful ingrowing nail at medial nail borders of the hallux nail right. Bilateral thickened elongated mycotic toenails noted x10 they are painful in nature x10.  Orthopedic: Normal joint ROM without pain or crepitus bilaterally. No visible deformities. No bony tenderness.   Radiographs: None Assessment:   1. Pain due to onychomycosis of toenail   2. Diabetic peripheral vascular disorder ([place])   3. Ingrown toenail of  right foot   4. Right foot pain    Plan:  Patient was evaluated and treated and all questions answered.  Onychomycosis of toenails x 10 Onychomycosis with pain  -Nails palliatively debrided as below. -Educated on self-care  Procedure: Nail Debridement Rationale: pain  Type of Debridement: manual, sharp debridement. Instrumentation: Nail nipper, rotary burr. Number of Nails: 10  Procedures and Treatment: Consent by patient was obtained for treatment procedures. The patient understood the discussion of treatment and procedures well. All questions were answered thoroughly reviewed. Debridement of mycotic  and hypertrophic toenails, 1 through 5 bilateral and clearing of subungual debris. No ulceration, no infection noted.  Return Visit-Office Procedure: Patient instructed to return to the office for a follow up visit 3 months for continued evaluation and treatment.   Ingrown Nail, right -Patient elects to proceed with minor surgery to remove ingrown toenail removal today. Consent reviewed and signed by patient. -Ingrown nail excised. See procedure note. -Educated on post-procedure care including soaking. Written instructions provided and reviewed. -Patient to follow up in 2 weeks for nail check.  Procedure: Excision of Ingrown Toenail Location: Right 1st toe medial nail borders. Anesthesia: Lidocaine 1% plain; 1.5 mL and Marcaine 0.5% plain; 1.5 mL, digital block. Skin Prep: Betadine. Dressing: Silvadene; telfa; dry, sterile, compression dressing. Technique: Following skin prep, the toe was exsanguinated and a tourniquet was secured at the base of the toe. The affected nail border was freed, split with a nail splitter, and excised. Chemical matrixectomy was then performed with phenol and irrigated out with alcohol. The tourniquet was then removed and sterile dressing applied. Disposition: Patient tolerated procedure well. Patient to return in 2 weeks for follow-up.   Return in about 3 months (around 03/03/2019).

## 2018-12-01 NOTE — Patient Instructions (Signed)

## 2018-12-23 ENCOUNTER — Ambulatory Visit: Payer: Medicare Other | Admitting: Podiatry

## 2018-12-25 ENCOUNTER — Other Ambulatory Visit: Payer: Self-pay

## 2018-12-26 ENCOUNTER — Ambulatory Visit (INDEPENDENT_AMBULATORY_CARE_PROVIDER_SITE_OTHER): Payer: Medicare Other | Admitting: Gynecology

## 2018-12-26 ENCOUNTER — Encounter: Payer: Self-pay | Admitting: Gynecology

## 2018-12-26 VITALS — BP 124/80 | Ht 67.5 in | Wt 144.0 lb

## 2018-12-26 DIAGNOSIS — Z01419 Encounter for gynecological examination (general) (routine) without abnormal findings: Secondary | ICD-10-CM | POA: Diagnosis not present

## 2018-12-26 DIAGNOSIS — N952 Postmenopausal atrophic vaginitis: Secondary | ICD-10-CM

## 2018-12-26 DIAGNOSIS — Z9189 Other specified personal risk factors, not elsewhere classified: Secondary | ICD-10-CM

## 2018-12-26 DIAGNOSIS — Z853 Personal history of malignant neoplasm of breast: Secondary | ICD-10-CM

## 2018-12-26 NOTE — Progress Notes (Signed)
    Cindy Wong 01/21/1932 [id number]        85 y.o.  G1P0100 for breast and pelvic exam.  Without gynecologic complaints  Past medical history,surgical history, problem list, medications, allergies, family history and social history were all reviewed and documented as reviewed in the EPIC chart.  ROS:  Performed with pertinent positives and negatives included in the history, assessment and plan.   Additional significant findings : None   Exam: [name] assistant Vitals:   12/26/18 0946  BP: 124/80  Weight: 144 lb (65.3 kg)  Height: 5' 7.5" (1.715 m)   Body mass index is 22.22 kg/m.  General appearance:  Normal affect, orientation and appearance. Skin: Grossly normal HEENT: Without gross lesions.  No cervical or supraclavicular adenopathy. Thyroid normal.  Lungs:  Clear without wheezing, rales or rhonchi Cardiac: RR, without RMG Abdominal:  Soft, nontender, without masses, guarding, rebound, organomegaly or hernia Breasts:  Examined lying and sitting.  Left without masses, retractions, discharge or axillary adenopathy.  Right status post mastectomy.  No chest wall masses or axillary adenopathy Pelvic:  Ext, BUS, Vagina: With atrophic changes  Cervix: Flush with upper vagina  Adnexa: Without masses or tenderness    Anus and perineum: Normal   Rectovaginal: Normal sphincter tone without palpated masses or tenderness.    Assessment/Plan:  85 y.o. G87P0100 female for breast and pelvic exam.  Status post supracervical hysterectomy for leiomyoma.  1. Postmenopausal.  No significant menopausal symptoms. 2. History of right breast cancer status post mastectomy.  Exam NED.  Mammography 10/2018. 3. Colonoscopy 2020.   4. Pap smear 2010.  No Pap smear done today.  No history of significant abnormal Pap smears.  We both agree to stop screening per current screening guidelines. 5. DEXA is scheduled at Dr. Loren Racer office in January.  Patient will follow-up for this. 6. Health  maintenance.  No routine lab work done as patient does this elsewhere.  Follow-up 1 year, sooner as needed.   Anastasio Auerbach MD, 10:08 AM 12/26/2018

## 2018-12-26 NOTE — Patient Instructions (Signed)
Follow-up for your bone density at Dr. Loren Racer office in January  Follow-up in 1 year for annual exam

## 2019-01-09 ENCOUNTER — Ambulatory Visit (INDEPENDENT_AMBULATORY_CARE_PROVIDER_SITE_OTHER): Payer: Medicare Other | Admitting: Cardiology

## 2019-01-09 ENCOUNTER — Encounter: Payer: Self-pay | Admitting: Cardiology

## 2019-01-09 ENCOUNTER — Other Ambulatory Visit: Payer: Self-pay

## 2019-01-09 VITALS — BP 125/76 | HR 71 | Temp 97.8°F | Ht 65.0 in | Wt 149.0 lb

## 2019-01-09 DIAGNOSIS — R9439 Abnormal result of other cardiovascular function study: Secondary | ICD-10-CM

## 2019-01-09 DIAGNOSIS — I493 Ventricular premature depolarization: Secondary | ICD-10-CM

## 2019-01-09 DIAGNOSIS — I10 Essential (primary) hypertension: Secondary | ICD-10-CM | POA: Diagnosis not present

## 2019-01-09 NOTE — Progress Notes (Signed)
Follow up visit  Subjective:   Cindy Wong, female    DOB: November 29, 1931, 85 y.o.   MRN: [id number]   Chief Complaint  Patient presents with  . PVCs  . Follow-up    8 weeks     HPI  85 year old African-American female with controlled hypertension, type 2 diabetes mellitus, hyperlipidemia, seen for frequent PVCs/ventricular bigeminy.  Stress test showed very small size, mild intensity, fixed perfusion defect in basal inferoseptal myocardium. While the defect is small, TID of 1.31 with regional perfusion defect is considered high risk finding. Clinical correlation recommended.  Echocardiogram showed structurally normal heart.   Patient is recovering from grief from loss of her brother. Otherwise, has no complaints.   Past Medical History:  Diagnosis Date  . Adenomatous colon polyp    tubular  . Anxiety   . Arthritis   . Blood transfusion without reported diagnosis   . Breast cancer ([place])   . Cataract   . Depression   . Diabetes mellitus   . Hx of meningitis 2012  . Hyperlipemia   . Hypertension   . Personal history of radiation therapy   . Stomach cancer [facility])      Past Surgical History:  Procedure Laterality Date  . ABDOMINAL HYSTERECTOMY  1974   Supracervical  . BREAST LUMPECTOMY Right 1993  . BREAST SURGERY     Mastectomy  . CHOLECYSTECTOMY    . COLONOSCOPY    . MASTECTOMY Right   . meningitis    . OOPHORECTOMY     One ovary removed--?side  . Stomach tumor    . UPPER GASTROINTESTINAL ENDOSCOPY       Social History   Socioeconomic History  . Marital status: Widowed    Spouse name: Not on file  . Number of children: 0  . Years of education: Not on file  . Highest education level: Not on file  Occupational History  . Occupation: retired    Fish farm manager: RETIRED  Social Needs  . Financial resource strain: Not on file  . Food insecurity    Worry: Not on file    Inability: Not on file  . Transportation needs    Medical: Not on file   Non-medical: Not on file  Tobacco Use  . Smoking status: Never Smoker  . Smokeless tobacco: Never Used  Substance and Sexual Activity  . Alcohol use: No    Alcohol/week: 0.0 standard drinks  . Drug use: No  . Sexual activity: Never    Birth control/protection: Surgical    Comment: HYST-1st intercourse 14 yo-5 partners  Lifestyle  . Physical activity    Days per week: Not on file    Minutes per session: Not on file  . Stress: Not on file  Relationships  . Social Herbalist on phone: Not on file    Gets together: Not on file    Attends religious service: Not on file    Active member of club or organization: Not on file    Attends meetings of clubs or organizations: Not on file    Relationship status: Not on file  . Intimate partner violence    Fear of current or ex partner: Not on file    Emotionally abused: Not on file    Physically abused: Not on file    Forced sexual activity: Not on file  Other Topics Concern  . Not on file  Social History Narrative  . Not on file     Family History  Problem Relation Age of Onset  . Breast cancer Sister 54  . Diabetes Sister   . Hypertension Sister   . Colon cancer Sister   . Melanoma Sister   . Heart disease Brother   . Diabetes Brother   . Hypertension Brother   . Prostate cancer Brother   . Colon cancer Brother   . Heart failure Brother   . Hypertension Mother   . Stroke Father   . Heart failure Sister   . Kidney disease Sister   . Hypertension Sister   . Cancer Sister   . Heart disease Brother   . Hypertension Brother   . Esophageal cancer Neg Hx   . Rectal cancer Neg Hx   . Stomach cancer Neg Hx      Current Outpatient Medications on File Prior to Visit  Medication Sig Dispense Refill  . amLODipine (NORVASC) 5 MG tablet Take 5 mg by mouth daily.    . carvedilol (COREG) 6.25 MG tablet Take 1 tablet (6.25 mg total) by mouth 2 (two) times daily. 60 tablet 3  . Cholecalciferol (D2000 ULTRA STRENGTH) 50  MCG (2000 UT) CAPS Take by mouth daily.    Marland Kitchen glipiZIDE (GLUCOTROL XL) 10 MG 24 hr tablet Take 10 mg by mouth daily.      . hydrochlorothiazide (HYDRODIURIL) 25 MG tablet Take 25 mg by mouth daily.  10  . Linagliptin-Metformin HCl (JENTADUETO) 2.5-500 MG TABS Take 1 tablet by mouth 2 (two) times daily.     Marland Kitchen lisinopril (PRINIVIL,ZESTRIL) 40 MG tablet Take 40 mg by mouth daily.   0  . Multiple Vitamins-Minerals (CENTRUM SILVER PO) Take 1 tablet by mouth daily.     Glory Rosebush VERIO test strip 2 (two) times daily. for testing  2  . potassium chloride SA (K-DUR,KLOR-CON) 20 MEQ tablet Take 20 mEq by mouth daily.    . pravastatin (PRAVACHOL) 40 MG tablet Take 40 mg by mouth every evening.     Marland Kitchen ROYAL JELLY PO Take by mouth daily.     No current facility-administered medications on file prior to visit.     Cardiovascular studies:  EKG 11/20/2018: Sinus rhythm 74 bpm.  Frequent pvcs -ventricular trigeminy. Left atrial enlargement. No significant change compared to previous EKG.  Echocardiogram 10/16/2018: Left ventricle cavity is normal in size. Normal left ventricular wall thickness. Normal LV systolic function with EF 55%. Normal global wall motion. Diastolic function assessment limited due to frequent PVC's. Calculated EF 55%. Trileaflet aortic valve with mild thickening. Trace aortic stenosis. No regurgitation noted. Mild tricuspid regurgitation. Trace pulmonic regurgitation.  No evidence of pulmonary hypertension.  Lexiscan Myoview stress test 09/29/2018: Lexiscan stress test was performed. Stress EKG is non-diagnostic, as this is pharmacological stress test. In addition, rest and stress EKG demonstrate sinus rhythm, frequent PVC's, inferolateral nonspecific ST-T changes.  LVEF 63%. SPECT stress and rest images demonstrate very small size, mild intensity, fixed perfusion defect in basal inferoseptal myocardium. While the defect is small, TID of 1.31 with regional perfusion defect is  considered high risk finding. Clinical correlation recommended.   EKG 09/15/2018: Sinus rhythm 77 bpm. Frequent PVC  Carotid US 08/14/2018: Right Carotid: Velocities in the right ICA are consistent with a 1-39% stenosis.                Non-hemodynamically significant plaque <50% noted in the CCA. The                ECA appears <50% stenosed.  Left Carotid: Velocities  in the left ICA are consistent with a 1-39% stenosis.               Non-hemodynamically significant plaque noted in the CCA.               Heterogenous multinodular thyroid gland incidentally noted.   Recent labs: Results for CATIA, TODOROV (MRN [id number]) as of 10/16/2018 21:10  Ref. Range 05/08/2018 14:11  Sodium Latest Ref Range: 135 - 145 mmol/L 141  Potassium Latest Ref Range: 3.5 - 5.1 mmol/L 3.7  Chloride Latest Ref Range: 98 - 111 mmol/L 105  CO2 Latest Ref Range: 22 - 32 mmol/L 25  Glucose Latest Ref Range: 70 - 99 mg/dL 102 (H)  BUN Latest Ref Range: 8 - 23 mg/dL 8  Creatinine Latest Ref Range: 0.44 - 1.00 mg/dL 1.00  Calcium Latest Ref Range: 8.9 - 10.3 mg/dL 9.4  Anion gap Latest Ref Range: 5 - 15  11  Alkaline Phosphatase Latest Ref Range: 38 - 126 U/L 40  Albumin Latest Ref Range: 3.5 - 5.0 g/dL 3.4 (L)  AST Latest Ref Range: 15 - 41 U/L 21  ALT Latest Ref Range: 0 - 44 U/L 19  Total Protein Latest Ref Range: 6.5 - 8.1 g/dL 6.3 (L)  Total Bilirubin Latest Ref Range: 0.3 - 1.2 mg/dL 1.0  GFR, Est Non African American Latest Ref Range: >60 mL/min 51 (L)  GFR, Est African American Latest Ref Range: >60 mL/min 59 (L)   Results for DYNESHIA, BACCAM (MRN [id number]) as of 10/16/2018 21:10  Ref. Range 05/08/2018 14:11  Sodium Latest Ref Range: 135 - 145 mmol/L 141  Potassium Latest Ref Range: 3.5 - 5.1 mmol/L 3.7  Chloride Latest Ref Range: 98 - 111 mmol/L 105  CO2 Latest Ref Range: 22 - 32 mmol/L 25  Glucose Latest Ref Range: 70 - 99 mg/dL 102 (H)  BUN Latest Ref Range: 8 - 23 mg/dL 8  Creatinine Latest  Ref Range: 0.44 - 1.00 mg/dL 1.00  Calcium Latest Ref Range: 8.9 - 10.3 mg/dL 9.4  Anion gap Latest Ref Range: 5 - 15  11  Alkaline Phosphatase Latest Ref Range: 38 - 126 U/L 40  Albumin Latest Ref Range: 3.5 - 5.0 g/dL 3.4 (L)  AST Latest Ref Range: 15 - 41 U/L 21  ALT Latest Ref Range: 0 - 44 U/L 19  Total Protein Latest Ref Range: 6.5 - 8.1 g/dL 6.3 (L)  Total Bilirubin Latest Ref Range: 0.3 - 1.2 mg/dL 1.0  GFR, Est Non African American Latest Ref Range: >60 mL/min 51 (L)  GFR, Est African American Latest Ref Range: >60 mL/min 59 (L)    Review of Systems  Constitution: Positive for malaise/fatigue. Negative for decreased appetite, weight gain and weight loss.  HENT: Negative for congestion.   Eyes: Negative for visual disturbance.  Cardiovascular: Negative for chest pain, dyspnea on exertion, leg swelling, palpitations and syncope.  Respiratory: Negative for cough.   Endocrine: Negative for cold intolerance.  Hematologic/Lymphatic: Does not bruise/bleed easily.  Skin: Negative for itching and rash.  Musculoskeletal: Negative for myalgias.  Gastrointestinal: Negative for abdominal pain, nausea and vomiting.  Genitourinary: Negative for dysuria.  Neurological: Negative for dizziness and weakness.  Psychiatric/Behavioral: The patient is not nervous/anxious.   All other systems reviewed and are negative.        Vitals:   01/09/19 1447 01/09/19 1511  BP: (!) 155/73 125/76  Pulse: 60 71  Temp: 97.8 F (36.6 C)   SpO2: 100%  Body mass index is 24.79 kg/m. Filed Weights   01/09/19 1447  Weight: 149 lb (67.6 kg)     Objective:   Physical Exam  Constitutional: She is oriented to person, place, and time. She appears well-developed and well-nourished. No distress.  HENT:  Head: Normocephalic and atraumatic.  Eyes: Pupils are equal, round, and reactive to light. Conjunctivae are normal.  Neck: No JVD present.  Cardiovascular: Normal rate, regular rhythm and intact  distal pulses.  Extrasystoles are present.  No murmur heard. Pulmonary/Chest: Effort normal and breath sounds normal. She has no wheezes. She has no rales.  Abdominal: Soft. Bowel sounds are normal. There is no rebound.  Musculoskeletal:        General: No edema.  Lymphadenopathy:    She has no cervical adenopathy.  Neurological: She is alert and oriented to person, place, and time. No cranial nerve deficit.  Skin: Skin is warm and dry.  Psychiatric: She has a normal mood and affect.  Nursing note and vitals reviewed.         Assessment & Recommendations:   85 year old African-American female with controlled hypertension, type 2 diabetes mellitus, hyperlipidemia, seen for frequent PVCs/ventricular bigeminy, abnormal stress test  Frequent PVC, hypertension: Controlled on carvedilol 6.25 mg bid.  Structurally normal heart. Stress test shows TID. However, she does not have any angina symptoms at this time.  Given advanced age and absence of angina, do not recommend Aspirin at this time. Okay to continue pravastatin.   F/u in 6 months  Joreen Swearingin Esther Hardy, MD [facility] [place] Pager: [phone] Office: [phone] If no answer Cell [phone]

## 2019-01-10 ENCOUNTER — Encounter: Payer: Self-pay | Admitting: Cardiology

## 2019-03-09 DIAGNOSIS — F331 Major depressive disorder, recurrent, moderate: Secondary | ICD-10-CM | POA: Diagnosis not present

## 2019-03-09 DIAGNOSIS — D692 Other nonthrombocytopenic purpura: Secondary | ICD-10-CM | POA: Diagnosis not present

## 2019-03-09 DIAGNOSIS — Z1331 Encounter for screening for depression: Secondary | ICD-10-CM | POA: Diagnosis not present

## 2019-03-09 DIAGNOSIS — F411 Generalized anxiety disorder: Secondary | ICD-10-CM | POA: Diagnosis not present

## 2019-03-09 DIAGNOSIS — Z9010 Acquired absence of unspecified breast and nipple: Secondary | ICD-10-CM | POA: Diagnosis not present

## 2019-03-09 DIAGNOSIS — M1180 Other specified crystal arthropathies, unspecified site: Secondary | ICD-10-CM | POA: Diagnosis not present

## 2019-03-09 DIAGNOSIS — I10 Essential (primary) hypertension: Secondary | ICD-10-CM | POA: Diagnosis not present

## 2019-03-09 DIAGNOSIS — Z853 Personal history of malignant neoplasm of breast: Secondary | ICD-10-CM | POA: Diagnosis not present

## 2019-03-09 DIAGNOSIS — E119 Type 2 diabetes mellitus without complications: Secondary | ICD-10-CM | POA: Diagnosis not present

## 2019-03-09 DIAGNOSIS — I493 Ventricular premature depolarization: Secondary | ICD-10-CM | POA: Diagnosis not present

## 2019-03-09 DIAGNOSIS — E7800 Pure hypercholesterolemia, unspecified: Secondary | ICD-10-CM | POA: Diagnosis not present

## 2019-03-13 DIAGNOSIS — E119 Type 2 diabetes mellitus without complications: Secondary | ICD-10-CM | POA: Diagnosis not present

## 2019-03-19 ENCOUNTER — Other Ambulatory Visit: Payer: Self-pay

## 2019-03-19 NOTE — Patient Outreach (Signed)
[facility] Evaluation Interviewer called patient on today regarding [facility] referral. Successful outreach.   Initial evaluation scheduled for 03/27/2019.  [place] Management Assistant [phone]

## 2019-03-29 ENCOUNTER — Other Ambulatory Visit: Payer: Self-pay | Admitting: Cardiology

## 2019-03-29 DIAGNOSIS — I493 Ventricular premature depolarization: Secondary | ICD-10-CM

## 2019-05-19 ENCOUNTER — Encounter: Payer: Self-pay | Admitting: Podiatry

## 2019-05-19 ENCOUNTER — Other Ambulatory Visit: Payer: Self-pay

## 2019-05-19 ENCOUNTER — Ambulatory Visit (INDEPENDENT_AMBULATORY_CARE_PROVIDER_SITE_OTHER): Payer: Medicare Other | Admitting: Podiatry

## 2019-05-19 VITALS — Temp 96.8°F

## 2019-05-19 DIAGNOSIS — M79676 Pain in unspecified toe(s): Secondary | ICD-10-CM | POA: Diagnosis not present

## 2019-05-19 DIAGNOSIS — E1151 Type 2 diabetes mellitus with diabetic peripheral angiopathy without gangrene: Secondary | ICD-10-CM | POA: Diagnosis not present

## 2019-05-19 DIAGNOSIS — L84 Corns and callosities: Secondary | ICD-10-CM

## 2019-05-19 DIAGNOSIS — B351 Tinea unguium: Secondary | ICD-10-CM | POA: Diagnosis not present

## 2019-05-19 NOTE — Progress Notes (Signed)
This patient returns to my office for at risk foot care.  This patient requires this care by a professional since this patient will be at risk due to having diabetes.   This patient is unable to cut nails herself since the patient cannot reach her nails.These nails are painful walking and wearing shoes.   Patient has painful callus on the bottom of her right foot.  This is painful walking and wearing her shoes.  She has not been seen for over 1 year. This patient presents for at risk foot care today.  General Appearance  Alert, conversant and in no acute stress.  Vascular  Dorsalis pedis  pulses are palpable  Bilaterally. Posterior tibial pulses are absent  B/L.  Capillary return is within normal limits  bilaterally. Temperature is within normal limits  bilaterally.  Neurologic  Senn-Weinstein monofilament wire test within normal limits  bilaterally. Muscle power within normal limits bilaterally.  Nails Thick disfigured discolored nails with subungual debris  from hallux to fifth toes bilaterally. No evidence of bacterial infection or drainage bilaterally.  Orthopedic  No limitations of motion  feet .  No crepitus or effusions noted.  No bony pathology or digital deformities noted. DJD midfoot  B/L.  Skin  normotropic skin with no porokeratosis noted bilaterally.  No signs of infections or ulcers noted.   Porokeratosis sub 5th met right foot.  Onychomycosis  Pain in right toes  Pain in left toes  Porokeratosis right foot.  Consent was obtained for treatment procedures.   Mechanical debridement of nails 1-5  bilaterally performed with a nail nipper.  Filed with dremel without incident. Debride porokeratosis sub 5th right foot.   Return office visit    3 months                  Told patient to return for periodic foot care and evaluation due to potential at risk complications.   Gardiner Barefoot DPM

## 2019-05-27 ENCOUNTER — Telehealth: Payer: Self-pay

## 2019-05-27 NOTE — Telephone Encounter (Signed)
Fu appt on 4/15

## 2019-05-27 NOTE — Telephone Encounter (Signed)
Hold carvedilol for one day. Please schedule visit with me in the coming week.

## 2019-05-27 NOTE — Telephone Encounter (Signed)
Gave patient medication recommendation for carvedilol. She will schedule appointment for next week. Call transfer to front.

## 2019-05-27 NOTE — Telephone Encounter (Signed)
Pt called stating that her HR yesterday was 48 and 38 today with bp of 140/82. Feels fatigued, not sleeping. Wants to be seen sooner. Please advise  Usual provider: MP  Last office visit: 01/09/19  Next office visit: 07/09/19   Last hospitalization: 05/08/18   Current Outpatient Medications on File Prior to Visit  Medication Sig Dispense Refill  . amLODipine (NORVASC) 5 MG tablet Take 5 mg by mouth daily.    . carvedilol (COREG) 6.25 MG tablet TAKE 1 TABLET(6.25 MG) BY MOUTH TWICE DAILY 60 tablet 3  . Cholecalciferol (D2000 ULTRA STRENGTH) 50 MCG (2000 UT) CAPS Take by mouth daily.    Marland Kitchen glipiZIDE (GLUCOTROL XL) 10 MG 24 hr tablet Take 10 mg by mouth daily.      . hydrochlorothiazide (HYDRODIURIL) 25 MG tablet Take 25 mg by mouth daily.  10  . Linagliptin-Metformin HCl (JENTADUETO) 2.5-500 MG TABS Take 1 tablet by mouth 2 (two) times daily.     Marland Kitchen lisinopril (PRINIVIL,ZESTRIL) 40 MG tablet Take 40 mg by mouth daily.   0  . Multiple Vitamins-Minerals (CENTRUM SILVER PO) Take 1 tablet by mouth daily.     Glory Rosebush VERIO test strip 2 (two) times daily. for testing  2  . potassium chloride SA (K-DUR,KLOR-CON) 20 MEQ tablet Take 20 mEq by mouth daily.    . pravastatin (PRAVACHOL) 40 MG tablet Take 40 mg by mouth every evening.     Marland Kitchen ROYAL JELLY PO Take by mouth daily.     No current facility-administered medications on file prior to visit.

## 2019-06-04 ENCOUNTER — Other Ambulatory Visit: Payer: Self-pay

## 2019-06-04 ENCOUNTER — Encounter: Payer: Self-pay | Admitting: Cardiology

## 2019-06-04 ENCOUNTER — Ambulatory Visit: Payer: Medicare Other | Admitting: Cardiology

## 2019-06-04 VITALS — BP 172/59 | HR 37 | Temp 94.5°F | Resp 17 | Ht 65.0 in | Wt 149.0 lb

## 2019-06-04 DIAGNOSIS — I493 Ventricular premature depolarization: Secondary | ICD-10-CM | POA: Diagnosis not present

## 2019-06-04 DIAGNOSIS — R9439 Abnormal result of other cardiovascular function study: Secondary | ICD-10-CM

## 2019-06-04 DIAGNOSIS — I10 Essential (primary) hypertension: Secondary | ICD-10-CM

## 2019-06-04 NOTE — Progress Notes (Signed)
Follow up visit  Subjective:   Cindy Wong, female    DOB: [birth year], 86 y.o.   MRN: [id number]   Chief Complaint  Patient presents with  . PVCs  . Palpitations  . Follow-up     HPI  86 year old African-American female with controlled hypertension, type 2 diabetes mellitus, hyperlipidemia, seen for frequent PVCs/ventricular bigeminy, abnormal stress test  Stress test showed very small size, mild intensity, fixed perfusion defect in basal inferoseptal myocardium. While the defect is small, TID of 1.31 with regional perfusion defect is considered high risk finding. Clinical correlation recommended.  Echocardiogram showed structurally normal heart.   Patient was worried about her low heart rate. Cindy Wong has no complaints. Blood pressure is elevated today, usually lower than this.    Current Outpatient Medications on File Prior to Visit  Medication Sig Dispense Refill  . amLODipine (NORVASC) 5 MG tablet Take 5 mg by mouth daily.    . carvedilol (COREG) 6.25 MG tablet TAKE 1 TABLET(6.25 MG) BY MOUTH TWICE DAILY 60 tablet 3  . Cholecalciferol (D2000 ULTRA STRENGTH) 50 MCG (2000 UT) CAPS Take by mouth daily.    Marland Kitchen glipiZIDE (GLUCOTROL XL) 10 MG 24 hr tablet Take 10 mg by mouth daily.      . hydrochlorothiazide (HYDRODIURIL) 25 MG tablet Take 25 mg by mouth daily.  10  . Linagliptin-Metformin HCl (JENTADUETO) 2.5-500 MG TABS Take 1 tablet by mouth 2 (two) times daily.     Marland Kitchen lisinopril (PRINIVIL,ZESTRIL) 40 MG tablet Take 40 mg by mouth daily.   0  . Multiple Vitamins-Minerals (CENTRUM SILVER PO) Take 1 tablet by mouth daily.     Glory Rosebush VERIO test strip 2 (two) times daily. for testing  2  . potassium chloride SA (K-DUR,KLOR-CON) 20 MEQ tablet Take 20 mEq by mouth daily.    . pravastatin (PRAVACHOL) 40 MG tablet Take 40 mg by mouth every evening.     Marland Kitchen ROYAL JELLY PO Take by mouth daily.     No current facility-administered medications on file prior to visit.     Cardiovascular studies:  EKG 06/04/2019: Sinus rhythm 72 bpm. Ventricular bigeminy  Left atrial enlargement.  No significant change compared to previous EKG.  Echocardiogram 10/16/2018: Left ventricle cavity is normal in size. Normal left ventricular wall thickness. Normal LV systolic function with EF 55%. Normal global wall motion. Diastolic function assessment limited due to frequent PVC's. Calculated EF 55%. Trileaflet aortic valve with mild thickening. Trace aortic stenosis. No regurgitation noted. Mild tricuspid regurgitation. Trace pulmonic regurgitation.  No evidence of pulmonary hypertension.  Lexiscan Myoview stress test 09/29/2018: Lexiscan stress test was performed. Stress EKG is non-diagnostic, as this is pharmacological stress test. In addition, rest and stress EKG demonstrate sinus rhythm, frequent PVC's, inferolateral nonspecific ST-T changes.  LVEF 63%. SPECT stress and rest images demonstrate very small size, mild intensity, fixed perfusion defect in basal inferoseptal myocardium. While the defect is small, TID of 1.31 with regional perfusion defect is considered high risk finding. Clinical correlation recommended.   EKG 09/15/2018: Sinus rhythm 77 bpm. Frequent PVC  Carotid US 08/14/2018: Right Carotid: Velocities in the right ICA are consistent with a 1-39% stenosis.                Non-hemodynamically significant plaque <50% noted in the CCA. The                ECA appears <50% stenosed.  Left Carotid: Velocities in the left ICA are consistent with a  1-39% stenosis.               Non-hemodynamically significant plaque noted in the CCA.               Heterogenous multinodular thyroid gland incidentally noted.   Recent labs: 11/21/2018: Glucose 101, BUN/Cr 18/0.89. EGFR >60. Na/K 142/4.1. Rest of the CMP normal H/H 15/48. MCV 85. Platelets 268   Review of Systems  Constitution: Positive for malaise/fatigue.  Cardiovascular: Negative for chest pain, dyspnea  on exertion, leg swelling, palpitations and syncope.         Vitals:   06/04/19 1031 06/04/19 1046  BP: (!) 184/51 (!) 172/59  Pulse: (!) 42 (!) 37  Resp: 17   Temp: (!) 94.5 F (34.7 C)   SpO2: 100%      Body mass index is 24.79 kg/m. Filed Weights   06/04/19 1031  Weight: 149 lb (67.6 kg)     Objective:   Physical Exam  Constitutional: She appears well-developed and well-nourished.  Neck: No JVD present.  Cardiovascular: Normal rate, regular rhythm, normal heart sounds and intact distal pulses.  No murmur heard. Pulmonary/Chest: Effort normal and breath sounds normal. She has no wheezes. She has no rales.  Musculoskeletal:        General: No edema.  Nursing note and vitals reviewed.         Assessment & Recommendations:   86 year old African-American female with controlled hypertension, type 2 diabetes mellitus, hyperlipidemia, seen for frequent PVCs/ventricular bigeminy, abnormal stress test  Frequent PVC, hypertension: Controlled on carvedilol 6.25 mg bid.  Structurally normal heart. Stress test shows TID. However, she does not have any angina symptoms at this time.  Given advanced age and absence of angina, do not recommend Aspirin at this time. Okay to continue pravastatin.   Ventricular bigeminy: Reason for her perceived bradycardia. She is asymptomatic. Continue beta blocker use.  F/u in 6 months  Cindy Rommel Esther Hardy, MD [facility] [place] Pager: [phone] Office: [phone] If no answer Cell [phone]

## 2019-06-11 ENCOUNTER — Telehealth: Payer: Self-pay | Admitting: Pharmacist

## 2019-06-16 NOTE — Telephone Encounter (Signed)
BP readings reviewed. BP elevated and not at goal. Called pt to discuss elevated BP readings. Pt denies any complains of CP, HA, SOB, edema, dyspnea, dysarthria. Pt stated that was getting ERROR and OUT readings on her BP monitor. Requested pt to come to the clinic to review appropriate BP techniques and trouble shoot reasons for ERROR readings. Med list reviewed and updated. Re-educate pt on appropriate BP technique and used teach-back technique to ensure pt was able to successfully get accurate BP readings. Will continue to monitor and follow up BP readings as needed.

## 2019-06-19 DIAGNOSIS — I10 Essential (primary) hypertension: Secondary | ICD-10-CM | POA: Diagnosis not present

## 2019-06-22 DIAGNOSIS — F331 Major depressive disorder, recurrent, moderate: Secondary | ICD-10-CM | POA: Diagnosis not present

## 2019-06-22 DIAGNOSIS — I493 Ventricular premature depolarization: Secondary | ICD-10-CM | POA: Diagnosis not present

## 2019-06-22 DIAGNOSIS — I10 Essential (primary) hypertension: Secondary | ICD-10-CM | POA: Diagnosis not present

## 2019-06-22 DIAGNOSIS — F411 Generalized anxiety disorder: Secondary | ICD-10-CM | POA: Diagnosis not present

## 2019-07-09 ENCOUNTER — Ambulatory Visit: Payer: Medicare Other | Admitting: Cardiology

## 2019-07-15 DIAGNOSIS — I10 Essential (primary) hypertension: Secondary | ICD-10-CM | POA: Diagnosis not present

## 2019-07-17 DIAGNOSIS — I10 Essential (primary) hypertension: Secondary | ICD-10-CM | POA: Diagnosis not present

## 2019-07-31 ENCOUNTER — Other Ambulatory Visit: Payer: Self-pay | Admitting: Cardiology

## 2019-07-31 DIAGNOSIS — I493 Ventricular premature depolarization: Secondary | ICD-10-CM

## 2019-08-18 ENCOUNTER — Encounter: Payer: Self-pay | Admitting: Podiatry

## 2019-08-18 ENCOUNTER — Other Ambulatory Visit: Payer: Self-pay

## 2019-08-18 ENCOUNTER — Ambulatory Visit (INDEPENDENT_AMBULATORY_CARE_PROVIDER_SITE_OTHER): Payer: Medicare Other | Admitting: Podiatry

## 2019-08-18 DIAGNOSIS — B351 Tinea unguium: Secondary | ICD-10-CM | POA: Diagnosis not present

## 2019-08-18 DIAGNOSIS — E1151 Type 2 diabetes mellitus with diabetic peripheral angiopathy without gangrene: Secondary | ICD-10-CM

## 2019-08-18 DIAGNOSIS — M79676 Pain in unspecified toe(s): Secondary | ICD-10-CM | POA: Diagnosis not present

## 2019-08-18 NOTE — Progress Notes (Signed)
This patient returns to my office for at risk foot care.  This patient requires this care by a professional since this patient will be at risk due to having diabetes.   This patient is unable to cut nails herself since the patient cannot reach her nails.These nails are painful walking and wearing shoes.   Patient has painful callus on the bottom of her right foot.  This is painful walking and wearing her shoes. . This patient presents for at risk foot care today.  General Appearance  Alert, conversant and in no acute stress.  Vascular  Dorsalis pedis  pulses are palpable  Bilaterally. Posterior tibial pulses are absent  B/L.  Capillary return is within normal limits  bilaterally. Temperature is within normal limits  bilaterally.  Neurologic  Senn-Weinstein monofilament wire test within normal limits  bilaterally. Muscle power within normal limits bilaterally.  Nails Thick disfigured discolored nails with subungual debris  from hallux to fifth toes bilaterally. No evidence of bacterial infection or drainage bilaterally.  Orthopedic  No limitations of motion  feet .  No crepitus or effusions noted.  No bony pathology or digital deformities noted. DJD midfoot  B/L.  Skin  normotropic skin with no porokeratosis noted bilaterally.  No signs of infections or ulcers noted.   Porokeratosis sub 5th met right foot. Asymptomatic.  Onychomycosis  Pain in right toes  Pain in left toes  .  Consent was obtained for treatment procedures.   Mechanical debridement of nails 1-5  bilaterally performed with a nail nipper.  Filed with dremel without incident. Debride porokeratosis sub 5th right foot.   Return office visit    3 months                  Told patient to return for periodic foot care and evaluation due to potential at risk complications.   Gardiner Barefoot DPM

## 2019-08-19 DIAGNOSIS — I10 Essential (primary) hypertension: Secondary | ICD-10-CM | POA: Diagnosis not present

## 2019-09-08 DIAGNOSIS — E7800 Pure hypercholesterolemia, unspecified: Secondary | ICD-10-CM | POA: Diagnosis not present

## 2019-09-08 DIAGNOSIS — E119 Type 2 diabetes mellitus without complications: Secondary | ICD-10-CM | POA: Diagnosis not present

## 2019-09-08 DIAGNOSIS — E559 Vitamin D deficiency, unspecified: Secondary | ICD-10-CM | POA: Diagnosis not present

## 2019-09-10 DIAGNOSIS — L821 Other seborrheic keratosis: Secondary | ICD-10-CM | POA: Diagnosis not present

## 2019-09-10 DIAGNOSIS — D229 Melanocytic nevi, unspecified: Secondary | ICD-10-CM | POA: Diagnosis not present

## 2019-09-15 DIAGNOSIS — M8580 Other specified disorders of bone density and structure, unspecified site: Secondary | ICD-10-CM | POA: Diagnosis not present

## 2019-09-15 DIAGNOSIS — Z1212 Encounter for screening for malignant neoplasm of rectum: Secondary | ICD-10-CM | POA: Diagnosis not present

## 2019-09-15 DIAGNOSIS — E7800 Pure hypercholesterolemia, unspecified: Secondary | ICD-10-CM | POA: Diagnosis not present

## 2019-09-15 DIAGNOSIS — E559 Vitamin D deficiency, unspecified: Secondary | ICD-10-CM | POA: Diagnosis not present

## 2019-09-15 DIAGNOSIS — F331 Major depressive disorder, recurrent, moderate: Secondary | ICD-10-CM | POA: Diagnosis not present

## 2019-09-15 DIAGNOSIS — Z853 Personal history of malignant neoplasm of breast: Secondary | ICD-10-CM | POA: Diagnosis not present

## 2019-09-15 DIAGNOSIS — K222 Esophageal obstruction: Secondary | ICD-10-CM | POA: Diagnosis not present

## 2019-09-15 DIAGNOSIS — I10 Essential (primary) hypertension: Secondary | ICD-10-CM | POA: Diagnosis not present

## 2019-09-15 DIAGNOSIS — I493 Ventricular premature depolarization: Secondary | ICD-10-CM | POA: Diagnosis not present

## 2019-09-15 DIAGNOSIS — Z0000 Encounter for general adult medical examination without abnormal findings: Secondary | ICD-10-CM | POA: Diagnosis not present

## 2019-09-15 DIAGNOSIS — F411 Generalized anxiety disorder: Secondary | ICD-10-CM | POA: Diagnosis not present

## 2019-09-15 DIAGNOSIS — E119 Type 2 diabetes mellitus without complications: Secondary | ICD-10-CM | POA: Diagnosis not present

## 2019-09-15 DIAGNOSIS — Z9010 Acquired absence of unspecified breast and nipple: Secondary | ICD-10-CM | POA: Diagnosis not present

## 2019-09-15 DIAGNOSIS — Z1339 Encounter for screening examination for other mental health and behavioral disorders: Secondary | ICD-10-CM | POA: Diagnosis not present

## 2019-09-15 DIAGNOSIS — R82998 Other abnormal findings in urine: Secondary | ICD-10-CM | POA: Diagnosis not present

## 2019-09-19 DIAGNOSIS — I10 Essential (primary) hypertension: Secondary | ICD-10-CM | POA: Diagnosis not present

## 2019-10-05 ENCOUNTER — Ambulatory Visit (INDEPENDENT_AMBULATORY_CARE_PROVIDER_SITE_OTHER): Payer: Medicare Other | Admitting: Psychologist

## 2019-10-05 DIAGNOSIS — F320 Major depressive disorder, single episode, mild: Secondary | ICD-10-CM | POA: Diagnosis not present

## 2019-10-08 DIAGNOSIS — H524 Presbyopia: Secondary | ICD-10-CM | POA: Diagnosis not present

## 2019-10-08 DIAGNOSIS — H2513 Age-related nuclear cataract, bilateral: Secondary | ICD-10-CM | POA: Diagnosis not present

## 2019-10-15 ENCOUNTER — Ambulatory Visit (INDEPENDENT_AMBULATORY_CARE_PROVIDER_SITE_OTHER): Payer: Medicare Other | Admitting: Psychologist

## 2019-10-15 DIAGNOSIS — F320 Major depressive disorder, single episode, mild: Secondary | ICD-10-CM

## 2019-10-20 DIAGNOSIS — I10 Essential (primary) hypertension: Secondary | ICD-10-CM | POA: Diagnosis not present

## 2019-11-02 ENCOUNTER — Other Ambulatory Visit: Payer: Self-pay

## 2019-11-02 ENCOUNTER — Ambulatory Visit
Admission: RE | Admit: 2019-11-02 | Discharge: 2019-11-02 | Disposition: A | Discharge status: Home / Self Care | Payer: Medicare Other | Source: Ambulatory Visit | Attending: Hematology | Admitting: Hematology

## 2019-11-02 DIAGNOSIS — Z1231 Encounter for screening mammogram for malignant neoplasm of breast: Secondary | ICD-10-CM

## 2019-11-04 ENCOUNTER — Other Ambulatory Visit: Payer: Self-pay | Admitting: Hematology

## 2019-11-04 DIAGNOSIS — R928 Other abnormal and inconclusive findings on diagnostic imaging of breast: Secondary | ICD-10-CM

## 2019-11-09 ENCOUNTER — Other Ambulatory Visit: Payer: Self-pay | Admitting: Hematology

## 2019-11-09 ENCOUNTER — Ambulatory Visit
Admission: RE | Admit: 2019-11-09 | Discharge: 2019-11-09 | Disposition: A | Discharge status: Home / Self Care | Payer: Medicare Other | Source: Ambulatory Visit | Attending: Hematology | Admitting: Hematology

## 2019-11-09 ENCOUNTER — Other Ambulatory Visit: Payer: Self-pay

## 2019-11-09 DIAGNOSIS — R928 Other abnormal and inconclusive findings on diagnostic imaging of breast: Secondary | ICD-10-CM

## 2019-11-09 DIAGNOSIS — N6310 Unspecified lump in the right breast, unspecified quadrant: Secondary | ICD-10-CM

## 2019-11-09 DIAGNOSIS — N6489 Other specified disorders of breast: Secondary | ICD-10-CM | POA: Diagnosis not present

## 2019-11-09 DIAGNOSIS — R922 Inconclusive mammogram: Secondary | ICD-10-CM | POA: Diagnosis not present

## 2019-11-17 ENCOUNTER — Encounter: Payer: Self-pay | Admitting: Podiatry

## 2019-11-17 ENCOUNTER — Ambulatory Visit (INDEPENDENT_AMBULATORY_CARE_PROVIDER_SITE_OTHER): Payer: Medicare Other | Admitting: Podiatry

## 2019-11-17 ENCOUNTER — Other Ambulatory Visit: Payer: Self-pay

## 2019-11-17 DIAGNOSIS — M79676 Pain in unspecified toe(s): Secondary | ICD-10-CM | POA: Diagnosis not present

## 2019-11-17 DIAGNOSIS — B351 Tinea unguium: Secondary | ICD-10-CM

## 2019-11-17 DIAGNOSIS — E1151 Type 2 diabetes mellitus with diabetic peripheral angiopathy without gangrene: Secondary | ICD-10-CM

## 2019-11-17 NOTE — Progress Notes (Signed)
This patient returns to my office for at risk foot care.  This patient requires this care by a professional since this patient will be at risk due to having diabetes.   This patient is unable to cut nails herself since the patient cannot reach her nails.  These nails is painful walking and wearing her shoes. . This patient presents for at risk foot care today.  General Appearance  Alert, conversant and in no acute stress.  Vascular  Dorsalis pedis  pulses are palpable  Bilaterally. Posterior tibial pulses are absent  B/L.  Capillary return is within normal limits  bilaterally. Temperature is within normal limits  bilaterally.  Neurologic  Senn-Weinstein monofilament wire test within normal limits  bilaterally. Muscle power within normal limits bilaterally.  Nails Thick disfigured discolored nails with subungual debris  from hallux to fifth toes bilaterally. No evidence of bacterial infection or drainage bilaterally.  Orthopedic  No limitations of motion  feet .  No crepitus or effusions noted.  No bony pathology or digital deformities noted. DJD midfoot  B/L.  Skin  normotropic skin with no porokeratosis noted bilaterally.  No signs of infections or ulcers noted.   Porokeratosis sub 5th met right foot. Asymptomatic.  Onychomycosis  Pain in right toes  Pain in left toes  .  Consent was obtained for treatment procedures.   Mechanical debridement of nails 1-5  bilaterally performed with a nail nipper.  Filed with dremel without incident.    Return office visit    3 months                  Told patient to return for periodic foot care and evaluation due to potential at risk complications.   Gardiner Barefoot DPM

## 2019-11-19 ENCOUNTER — Ambulatory Visit
Admission: RE | Admit: 2019-11-19 | Discharge: 2019-11-19 | Disposition: A | Discharge status: Home / Self Care | Payer: Medicare Other | Source: Ambulatory Visit | Attending: Hematology | Admitting: Hematology

## 2019-11-19 ENCOUNTER — Other Ambulatory Visit: Payer: Self-pay

## 2019-11-19 DIAGNOSIS — N6321 Unspecified lump in the left breast, upper outer quadrant: Secondary | ICD-10-CM | POA: Diagnosis not present

## 2019-11-19 DIAGNOSIS — C50812 Malignant neoplasm of overlapping sites of left female breast: Secondary | ICD-10-CM | POA: Diagnosis not present

## 2019-11-19 DIAGNOSIS — R928 Other abnormal and inconclusive findings on diagnostic imaging of breast: Secondary | ICD-10-CM

## 2019-11-19 DIAGNOSIS — Z170 Estrogen receptor positive status [ER+]: Secondary | ICD-10-CM | POA: Diagnosis not present

## 2019-11-19 DIAGNOSIS — I10 Essential (primary) hypertension: Secondary | ICD-10-CM | POA: Diagnosis not present

## 2019-11-22 NOTE — Progress Notes (Addendum)
[facility]   [phone] Fax:([phone]) [phone]   Clinic Follow up Note   Patient Care Team: Tisovec, Fransico Him, MD as PCP - Philomena Doheny, Paulette Blanch, RN as Oncology Nurse Navigator Rockwell Germany, RN as Oncology Nurse Navigator Truitt Merle, MD as Consulting Physician (Hematology) Date of service: 11/23/19   CHIEF COMPLAINT: F/u H/o ER/PR + DCIS right breast (1993, 2007 s/p mastectomy) and h/o GIST (1997 s/p partial gastrectomy), new left breast cancer    Oncology History  Malignant neoplasm of left breast ([place])  11/09/2019 Breast US   IMPRESSION: 1. Highly suspicious left breast mass at the 12 o'clock position 4 cm from the nipple. Recommendation is for ultrasound-guided biopsy. 2. Indeterminate hyperechoic mass at the 12 o'clock position 4 cm from the nipple, just superior to the index lesion. This may correspond with an additional asymmetry seen mammographically. Recommendation is for ultrasound-guided biopsy with close attention on post clip films to ensure mammographic correlation. 3. No suspicious left axillary lymphadenopathy. 4. No suspicious findings at the site of the right mastectomy bed palpable lump.   11/19/2019 Cancer Staging   Staging form: Breast, AJCC 8th Edition - Clinical stage from 11/19/2019: Stage IB (cT2, cN0, cM0, G2, ER+, PR+, HER2-) - Signed by Alla Feeling, NP on 11/23/2019   11/19/2019 Initial Biopsy   Diagnosis Breast, left, needle core biopsy, 12 o'clock - INVASIVE MAMMARY CARCINOMA - SEE COMMENT E-cadherin is POSITIVE supporting a ductal origin. PROGNOSTIC INDICATORS Results: IMMUNOHISTOCHEMICAL AND MORPHOMETRIC ANALYSIS PERFORMED MANUALLY The tumor cells are NEGATIVE for Her2 (1+). Estrogen Receptor: 95%, POSITIVE, STRONG STAINING INTENSITY Progesterone Receptor: 95%, POSITIVE, STRONG STAINING INTENSITY Proliferation Marker Ki67: 10%   11/23/2019 Initial Diagnosis   Malignant neoplasm of left breast ([facility])     CURRENT THERAPY: Surveillance for h/o DCIS and GIST  INTERVAL HISTORY: [name] returns for f/u as scheduled. She was last seen for routine surveillance f/u on 11/21/18. A left breast mass was detected on her annual screening mammogram on 11/02/19.  Diagnostic mammo and targeted US on 11/09/19 showed an irregular hypoechoic mass with vascularity in the 12 oclock position 4 cmfn measuring 2.3 x1.7 x1.4 cm. An additional vague irregular mass was seen just superior to the index lesion at 12 oclock 4 cmfn measuring 8x7x6 mm. No axillary adenopathy or abnormality at the right mastectomy site. Biopsy on 9/30 confirmed invasive ductal carcinoma.   Today she feels well, sore from biopsy but recovering. She remains independent with ADLs and all activities at home. Her health is in good shape. She experienced mild situational depression for 3-4 months this year, she attributes partially to losing her sister from cancer. She has felt much better lately except mild fatigue, she began walking again. Denies unintentional weight loss or decreased appetite, recent fever, chills, cough, chest pain, dyspnea. Denies abdominal pain or change in her bowel habits except stools are thin, no rectal bleeding.   MEDICAL HISTORY:  Past Medical History:  Diagnosis Date  . Adenomatous colon polyp    tubular  . Anxiety   . Arthritis   . Blood transfusion without reported diagnosis   . Breast cancer ([place])   . Cataract   . Depression   . Diabetes mellitus   . Hx of meningitis 2012  . Hyperlipemia   . Hypertension   . Personal history of radiation therapy   . Stomach cancer [facility])     SURGICAL HISTORY: Past Surgical History:  Procedure Laterality Date  . ABDOMINAL HYSTERECTOMY  1974  Supracervical  . BREAST LUMPECTOMY Right 1993  . BREAST SURGERY     Mastectomy  . CHOLECYSTECTOMY    . COLONOSCOPY    . MASTECTOMY Right   . meningitis    . OOPHORECTOMY     One ovary removed--?side  . Stomach tumor    . UPPER  GASTROINTESTINAL ENDOSCOPY      I have reviewed the social history and family history with the patient and they are unchanged from previous note.  ALLERGIES:  is allergic to sulfonamide derivatives and sulfamethoxazole.  MEDICATIONS:  Current Outpatient Medications  Medication Sig Dispense Refill  . amLODipine (NORVASC) 5 MG tablet Take 5 mg by mouth daily. In the morning    . carvedilol (COREG) 6.25 MG tablet TAKE 1 TABLET(6.25 MG) BY MOUTH TWICE DAILY 60 tablet 3  . Cholecalciferol (D2000 ULTRA STRENGTH) 50 MCG (2000 UT) CAPS Take by mouth daily.    Marland Kitchen glipiZIDE (GLUCOTROL XL) 10 MG 24 hr tablet Take 10 mg by mouth daily.      . hydrochlorothiazide (HYDRODIURIL) 25 MG tablet Take 25 mg by mouth daily. In the morning  10  . Linagliptin-Metformin HCl (JENTADUETO) 2.5-500 MG TABS Take 1 tablet by mouth 2 (two) times daily.     Marland Kitchen lisinopril (PRINIVIL,ZESTRIL) 40 MG tablet Take 40 mg by mouth daily. In the morning  0  . metoprolol succinate (TOPROL-XL) 25 MG 24 hr tablet Take 25 mg by mouth daily.    . Multiple Vitamins-Minerals (CENTRUM SILVER PO) Take 1 tablet by mouth daily.     Glory Rosebush VERIO test strip 2 (two) times daily. for testing  2  . potassium chloride SA (K-DUR,KLOR-CON) 20 MEQ tablet Take 20 mEq by mouth daily.    . pravastatin (PRAVACHOL) 40 MG tablet Take 40 mg by mouth every evening.     Marland Kitchen ROYAL JELLY PO Take by mouth daily.     No current facility-administered medications for this visit.    PHYSICAL EXAMINATION: ECOG PERFORMANCE STATUS: 0 - Asymptomatic  Vitals:   11/23/19 1116 11/23/19 1229  BP: (!) 184/69 (!) 153/86  Pulse: 74   Resp: 18   Temp: (!) 97.4 F (36.3 C)   SpO2: 98%    Filed Weights   11/23/19 1116  Weight: 148 lb 3.2 oz (67.2 kg)    GENERAL:alert, no distress and comfortable SKIN: No rash to exposed skin EYES:  sclera clear NECK: Without mass LYMPH:  no palpable cervical or supra clavicular lymphadenopathy  LUNGS:  normal breathing  effort HEART:  no lower extremity edema ABDOMEN:abdomen soft, non-tender and normal bowel sounds NEURO: alert & oriented x 3 with fluent speech Breast exam: There is a palpable 2 x 3.5 cm mass with ecchymosis at the 12 o'clock position of the left breast, no nipple inversion or discharge.  No other palpable mass or left axillary adenopathy.  S/p right mastectomy, incision completely healed.  There is a prominent rib near the incision otherwise no mass or nodularity on the the chest wall or right axilla  LABORATORY DATA:  I have reviewed the data as listed CBC Latest Ref Rng & Units 11/23/2019 11/21/2018 05/08/2018  WBC 4.0 - 10.5 K/uL 7.9 7.3 11.0(H)  Hemoglobin 12.0 - 15.0 g/dL 14.8 15.3(H) 13.4  Hematocrit 36 - 46 % 47.1(H) 48.9(H) 43.8  Platelets 150 - 400 K/uL 277 268 245     CMP Latest Ref Rng & Units 11/23/2019 11/21/2018 05/08/2018  Glucose 70 - 99 mg/dL 134(H) 101(H) 102(H)  BUN 8 -  23 mg/dL _0 Creatinine 0.44 - 1.00 mg/dL 0.93 0.89 1.00  Sodium 135 - 145 mmol/L 140 142 141  Potassium 3.5 - 5.1 mmol/L 3.9 4.1 3.7  Chloride 98 - 111 mmol/L 106 106 105  CO2 22 - 32 mmol/L _1 Calcium 8.9 - 10.3 mg/dL 10.0 10.1 9.4  Total Protein 6.5 - 8.1 g/dL 7.3 7.3 6.3(L)  Total Bilirubin 0.3 - 1.2 mg/dL 0.7 0.5 1.0  Alkaline Phos 38 - 126 U/L 47 55 40  AST 15 - 41 U/L _2 ALT 0 - 44 U/L _3 RADIOGRAPHIC STUDIES: I have personally reviewed the radiological images as listed and agreed with the findings in the report. No results found.   ASSESSMENT & PLAN: 86 year old postmenopausal female  1.  Malignant neoplasm of the left breast, grade 2, ER 95% strongly positive, PR 95% strongly positive, HER-2 negative (1+) Ki-67 10%, cT2 N0 M0 stage Ib -Found on routine screening mammogram in 10/2019, patient did not palpate the mass herself -We reviewed her imaging and biopsy in detail.  -Surgical consult with Dr. Georgette Dover on 11/27/2019.  Breast MRI is pending to evaluate a  possible satellite lesion in the left breast.  If she prefers lumpectomy she will need additional biopsy, if she prefers mastectomy she will not need additional biopsy -We reviewed this is likely low risk disease. Given her age, lives alone, she is not an idea candidate for adjuvant chemo but she is otherwise in good health. We do not plan to order oncotype  -Due to the strong ER/PR positive disease, she is a candidate for adjuvant antiestrogen therapy. She did not tolerate antiestrogen well with her previous DCIS but agrees to try. If she does not tolerate well, have low threshold to discontinue and monitor alone. -Follow-up after surgery to discuss AI and surveillance plan  2. Genetics  -She has personal h/o DCIS and GIST, now new left breast cancer. She qualifies for genetics.  -Sister passed from cancer, unknown type -Does not have children, has sibling and nieces  -Agrees to genetics, referred   3.  History of recurrent right breast DCIS  -Diagnosed initially in 1993, s/p lumpectomy and adjuvant radiation.  She reportedly tried antiestrogen therapy but did not tolerate well -She had a recurrence of ER/PR positive DCIS in 07/2005, s/p right mastectomy on 08/24/2005 -She has been compliant with surveillance plan  4. H/O GIST -Diagnosed in 1997, s/p partial gastrectomy, followed by Dr. Ardis Hughs  -no clinical signs of recurrence   5. Social  -Sister passed in June from cancer (in the abdomen ?), she was depressed for 3-4 months this year but better recently  -Has [place] provider at [facility] f/u today, will wait until f/u with surgery  -Encouraged to have brother come with her to appt    PLAN: -Imaging and biopsy reviewed -Surgical consult with Dr. Georgette Dover 10/8 -Breast MRI 10/10 -F/u after surgery to discuss AI    Orders Placed This Encounter  Procedures  . MR BREAST BILATERAL W [place] CAD    [employer] A[employer] PF 11/02/19 BCG  Cycle :NO DX: Malignant neoplasm of Left Breast  Wt  148 Ht5'9 / NO needs/ NO Claus/ NO Metal in eyes or removed/  NO implants inside or outside,No BB's or Bullets NO glucose monitor, spinal stimulator, injectors or Pacemakers/  Breast mastectomy of R ,No hx of sx to the brain, eyes, heart or ears/NKA to Contrast  w/pt ORDER CHECKED [facility] 11/23/19  NO TO COVID ?S"    Standing Status:   Future    Standing Expiration Date:   11/22/2020    Order Specific Question:   If indicated for the ordered procedure, I authorize the administration of contrast media per Radiology protocol    Answer:   Yes    Order Specific Question:   What is the patient's sedation requirement?    Answer:   No Sedation    Order Specific Question:   Does the patient have a pacemaker or implanted devices?    Answer:   No    Order Specific Question:   Preferred imaging location?    Answer:   [facility] (table limit-550lbs)  . Ambulatory referral to Genetics    Referral Priority:   Routine    Referral Type:   Consultation    Referral Reason:   Specialty Services Required    Number of Visits Requested:   1   All questions were answered. The patient knows to call the clinic with any problems, questions or concerns. No barriers to learning were detected.     Alla Feeling, NP 11/24/19   Addendum  I have seen the patient, examined her. I agree with the assessment and and plan and have edited the notes.   I reviewed her recent images, lab and biopsy results and discussed with pt in details.  Given the early stage disease, strong ER and PR positivity, and a negative HER-2, she will likely do well.  She is scheduled to see breast surgeon Dr. Georgette Dover later this week and is open to breast surgery.  Given her advanced age, she is not a candidate for adjuvant chemotherapy, and that this is likely low risk disease, I do not plan to obtain Oncotype.  We discussed the benefit and side effect of antiestrogen therapy after surgery, to reduce her risk of distant recurrence.  She previously  had a brief course of antiestrogen after her right breast DCIS and tolerated poorly.  She is open to try again after surgery, but we will likely hold it if she does not tolerate well. All questions were answered, we plan to see her back after surgery. We will also refer her to genetics, she is interested.   Truitt Merle 11/23/2019

## 2019-11-23 ENCOUNTER — Inpatient Hospital Stay: Payer: Medicare Other | Attending: Nurse Practitioner

## 2019-11-23 ENCOUNTER — Other Ambulatory Visit: Payer: Self-pay

## 2019-11-23 ENCOUNTER — Telehealth: Payer: Self-pay | Admitting: Nurse Practitioner

## 2019-11-23 ENCOUNTER — Inpatient Hospital Stay (HOSPITAL_BASED_OUTPATIENT_CLINIC_OR_DEPARTMENT_OTHER): Payer: Medicare Other | Admitting: Nurse Practitioner

## 2019-11-23 VITALS — BP 153/86 | HR 74 | Temp 97.4°F | Resp 18 | Ht 69.0 in | Wt 148.2 lb

## 2019-11-23 DIAGNOSIS — Z79899 Other long term (current) drug therapy: Secondary | ICD-10-CM | POA: Insufficient documentation

## 2019-11-23 DIAGNOSIS — Z23 Encounter for immunization: Secondary | ICD-10-CM | POA: Diagnosis not present

## 2019-11-23 DIAGNOSIS — D0511 Intraductal carcinoma in situ of right breast: Secondary | ICD-10-CM | POA: Diagnosis not present

## 2019-11-23 DIAGNOSIS — I10 Essential (primary) hypertension: Secondary | ICD-10-CM | POA: Insufficient documentation

## 2019-11-23 DIAGNOSIS — Z9049 Acquired absence of other specified parts of digestive tract: Secondary | ICD-10-CM | POA: Diagnosis not present

## 2019-11-23 DIAGNOSIS — R5383 Other fatigue: Secondary | ICD-10-CM | POA: Diagnosis not present

## 2019-11-23 DIAGNOSIS — C50812 Malignant neoplasm of overlapping sites of left female breast: Secondary | ICD-10-CM | POA: Diagnosis not present

## 2019-11-23 DIAGNOSIS — Z170 Estrogen receptor positive status [ER+]: Secondary | ICD-10-CM | POA: Diagnosis not present

## 2019-11-23 DIAGNOSIS — M1990 Unspecified osteoarthritis, unspecified site: Secondary | ICD-10-CM | POA: Insufficient documentation

## 2019-11-23 DIAGNOSIS — Z923 Personal history of irradiation: Secondary | ICD-10-CM | POA: Insufficient documentation

## 2019-11-23 DIAGNOSIS — Z85028 Personal history of other malignant neoplasm of stomach: Secondary | ICD-10-CM | POA: Diagnosis not present

## 2019-11-23 DIAGNOSIS — Z882 Allergy status to sulfonamides status: Secondary | ICD-10-CM | POA: Diagnosis not present

## 2019-11-23 DIAGNOSIS — C50912 Malignant neoplasm of unspecified site of left female breast: Secondary | ICD-10-CM | POA: Insufficient documentation

## 2019-11-23 DIAGNOSIS — C49A2 Gastrointestinal stromal tumor of stomach: Secondary | ICD-10-CM

## 2019-11-23 DIAGNOSIS — Z903 Acquired absence of stomach [part of]: Secondary | ICD-10-CM | POA: Insufficient documentation

## 2019-11-23 DIAGNOSIS — Z90721 Acquired absence of ovaries, unilateral: Secondary | ICD-10-CM | POA: Diagnosis not present

## 2019-11-23 LAB — CBC WITH DIFFERENTIAL/PLATELET
Abs Immature Granulocytes: 0.03 10*3/uL (ref 0.00–0.07)
Basophils Absolute: 0.1 10*3/uL (ref 0.0–0.1)
Basophils Relative: 1 %
Eosinophils Absolute: 0.1 10*3/uL (ref 0.0–0.5)
Eosinophils Relative: 2 %
HCT: 47.1 % — ABNORMAL HIGH (ref 36.0–46.0)
Hemoglobin: 14.8 g/dL (ref 12.0–15.0)
Immature Granulocytes: 0 %
Lymphocytes Relative: 27 %
Lymphs Abs: 2.1 10*3/uL (ref 0.7–4.0)
MCH: 26.3 pg (ref 26.0–34.0)
MCHC: 31.4 g/dL (ref 30.0–36.0)
MCV: 83.8 fL (ref 80.0–100.0)
Monocytes Absolute: 0.6 10*3/uL (ref 0.1–1.0)
Monocytes Relative: 7 %
Neutro Abs: 4.9 10*3/uL (ref 1.7–7.7)
Neutrophils Relative %: 63 %
Platelets: 277 10*3/uL (ref 150–400)
RBC: 5.62 MIL/uL — ABNORMAL HIGH (ref 3.87–5.11)
RDW: 12.8 % (ref 11.5–15.5)
WBC: 7.9 10*3/uL (ref 4.0–10.5)
nRBC: 0 % (ref 0.0–0.2)

## 2019-11-23 LAB — COMPREHENSIVE METABOLIC PANEL
ALT: 16 U/L (ref 0–44)
AST: 21 U/L (ref 15–41)
Albumin: 3.9 g/dL (ref 3.5–5.0)
Alkaline Phosphatase: 47 U/L (ref 38–126)
Anion gap: 5 (ref 5–15)
BUN: 19 mg/dL (ref 8–23)
CO2: 29 mmol/L (ref 22–32)
Calcium: 10 mg/dL (ref 8.9–10.3)
Chloride: 106 mmol/L (ref 98–111)
Creatinine, Ser: 0.93 mg/dL (ref 0.44–1.00)
GFR calc Af Amer: >60 mL/min (ref >60)
GFR calc non Af Amer: 56 mL/min — ABNORMAL LOW (ref >60)
Glucose, Bld: 134 mg/dL — ABNORMAL HIGH (ref 70–99)
Potassium: 3.9 mmol/L (ref 3.5–5.1)
Sodium: 140 mmol/L (ref 135–145)
Total Bilirubin: 0.7 mg/dL (ref 0.3–1.2)
Total Protein: 7.3 g/dL (ref 6.5–8.1)

## 2019-11-23 MED ORDER — INFLUENZA VAC A&B SA ADJ QUAD 0.5 ML IM PRSY
0.5000 mL | PREFILLED_SYRINGE | Freq: Once | INTRAMUSCULAR | Status: AC
  Administered 2019-11-23: 0.5 mL via INTRAMUSCULAR

## 2019-11-23 MED ORDER — INFLUENZA VAC A&B SA ADJ QUAD 0.5 ML IM PRSY
PREFILLED_SYRINGE | INTRAMUSCULAR | Status: AC
  Filled 2019-11-23: qty 0.5

## 2019-11-23 NOTE — Telephone Encounter (Signed)
Scheduled appt per 10/4 LOS - next available appt genetics is 10/26 - pt wanted to also schedule booster on same day  . Gave patient AVS and calender

## 2019-11-24 ENCOUNTER — Encounter: Payer: Self-pay | Admitting: *Deleted

## 2019-11-24 ENCOUNTER — Encounter: Payer: Self-pay | Admitting: Nurse Practitioner

## 2019-11-24 ENCOUNTER — Other Ambulatory Visit: Payer: Medicare Other

## 2019-11-27 ENCOUNTER — Ambulatory Visit: Payer: Self-pay | Admitting: Surgery

## 2019-11-27 ENCOUNTER — Other Ambulatory Visit: Payer: Self-pay | Admitting: Cardiology

## 2019-11-27 DIAGNOSIS — Z853 Personal history of malignant neoplasm of breast: Secondary | ICD-10-CM | POA: Diagnosis not present

## 2019-11-27 DIAGNOSIS — C50912 Malignant neoplasm of unspecified site of left female breast: Secondary | ICD-10-CM | POA: Diagnosis not present

## 2019-11-27 DIAGNOSIS — I493 Ventricular premature depolarization: Secondary | ICD-10-CM

## 2019-11-27 NOTE — H&P (View-Only) (Signed)
History of Present Illness  The patient is a 86 year old female who presents with breast cancer. Oncology - Dr. Truitt Merle PCP - Dr. Domenick Gong Cardiology - Dr. Virgina Jock  This is a pleasant 86 year old female who is s/p right mastectomy in 2007 by Dr. Bubba Camp for DCIS and s/p partial gastrectomy 1997 for GIST. She presents with a left breast mass detected on screening mammogram. This revealed a mass at 12:00 4cmfn measuring 2.3 x 1.7 x 1.4 cm. Just superior to this mass, there is an additional 8 x 7 x 6 mm mass. The axilla was negative. Biopsy of the larger mass confirmed IDC ER/PR +, Her 2 -, Ki67 - 10%. She is scheduled for breast MRI on 10/10. She has been seen by Oncology and is not felt to be a candidate for chemotherapy. She comes in today to discuss surgical options. She may be a candidate for adjuvant antiestrogen therapy, although she did not tolerate this well previously.   CLINICAL DATA: 86 year old female recalled from screening mammogram dated 11/02/2019 for a left breast mass. At the time of evaluation, the patient states she also has a palpable lump in her right mastectomy bed.  EXAM: DIGITAL DIAGNOSTIC LEFT MAMMOGRAM WITH CAD AND TOMO  ULTRASOUND LEFT BREAST  COMPARISON: Previous exam(s).  ACR Breast Density Category c: The breast tissue is heterogeneously dense, which may obscure small masses.  FINDINGS: There is a persistent irregular, spiculated hyperintense mass in the superior left breast. An asymmetry with questionable associated distortion is seen just superior to the index lesion in the MLO projection. Further evaluation with ultrasound was performed.  Mammographic images were processed with CAD.  Targeted ultrasound is performed, showing an irregular hypoechoic mass with associated vascularity at the 12 o'clock position 4 cm from the nipple. It measures 2.3 x 1.7 x 1.4 cm. This correlates well with the mammographic finding. An additional  vague, irregular hyperechoic mass is seen just superior to the index lesion at the 12 o'clock position 4 cm from the nipple. It measures 8 x 7 x 6 mm. There is no internal vascularity. This may correlate with the additional questioned asymmetry noted mammographically.  Evaluation of the left axilla demonstrates no suspicious lymphadenopathy. Evaluation in the right mastectomy bed at the site of the patient's palpable lump demonstrates a prominent underlying rib. No suspicious findings identified.  IMPRESSION: 1. Highly suspicious left breast mass at the 12 o'clock position 4 cm from the nipple. Recommendation is for ultrasound-guided biopsy. 2. Indeterminate hyperechoic mass at the 12 o'clock position 4 cm from the nipple, just superior to the index lesion. This may correspond with an additional asymmetry seen mammographically. Recommendation is for ultrasound-guided biopsy with close attention on post clip films to ensure mammographic correlation. 3. No suspicious left axillary lymphadenopathy. 4. No suspicious findings at the site of the right mastectomy bed palpable lump.  RECOMMENDATION: Two area ultrasound-guided biopsy of the left breast.  I have discussed the findings and recommendations with the patient. If applicable, a reminder letter will be sent to the patient regarding the next appointment.  BI-RADS CATEGORY 5: Highly suggestive of malignancy.   Electronically Signed By: Kristopher Oppenheim M.D. On: 11/09/2019 14:58     Problem List/Past Medical INVASIVE DUCTAL CARCINOMA OF LEFT BREAST IN FEMALE (C50.912) HISTORY OF RIGHT BREAST CANCER (Z85.3)  Allergies  Sulfa Antibiotics Allergies Reconciled  Medication History  amLODIPine Besylate (5MG Tablet, Oral) Active. Carvedilol (6.25MG Tablet, Oral) Active. glipiZIDE ER (10MG Tablet ER 24HR, Oral) Active.  hydroCHLOROthiazide (25MG Tablet, Oral) Active. Jentadueto (2.5-500MG Tablet, Oral)  Active. Lisinopril (40MG Tablet, Oral) Active. Pravastatin Sodium (40MG Tablet, Oral) Active. Medications Reconciled    Vitals  Weight: 146.5 lb Height: 69in Body Surface Area: 1.81 m Body Mass Index: 21.63 kg/m  Temp.: 96.4F Pulse: 101 (Regular)  BP: 126/72(Sitting, Left Arm, Standard)        Physical Exam   The physical exam findings are as follows: Note:Constitutional: WDWN in NAD, conversant, younger that stated age; no obvious deformities; resting comfortably Eyes: Pupils equal, round; sclera anicteric; moist conjunctiva; no lid lag HENT: Oral mucosa moist; good dentition Neck: No masses palpated, trachea midline; no thyromegaly Lungs: CTA bilaterally; normal respiratory effort Chest - right mastectomy site well-healed with no palpable masses or axillary lymphadenopathy Left breast - no axillary lymphadenopathy; no nipple discharge; firm palpable 3 cm mass in upper breast at 12:00 with slight ecchymosis CV: Regular rate and rhythm; no murmurs; extremities well-perfused with no edema Abd: +bowel sounds, soft, non-tender, no palpable organomegaly; no palpable hernias Musc: Normal gait; no apparent clubbing or cyanosis in extremities Lymphatic: No palpable cervical or axillary lymphadenopathy Skin: Warm, dry; no sign of jaundice Psychiatric - alert and oriented x 4; calm mood and affect    Assessment & Plan   INVASIVE DUCTAL CARCINOMA OF LEFT BREAST IN FEMALE (C50.912) Impression: 12:00 4 cmfn ER/PR +, Her2 - Ki67-10%   HISTORY OF RIGHT BREAST CANCER (Z85.3)  Current Plans Schedule for Surgery - Left mastectomy. The surgical procedure has been discussed with the patient. Potential risks, benefits, alternative treatments, and expected outcomes have been explained. All of the patient's questions at this time have been answered. The likelihood of reaching the patient's treatment goal is good. The patient understand the proposed surgical procedure and  wishes to proceed.  Cardiac clearance first from Dr. Patwardhan  Note:This would be a fairly generous lumpectomy with probably an unsatisfactory cosmetic outcome. Therefore, after discussion with the patient, we will plan a left mastectomy. We will not perform SLNB since she is not going to consider chemotherapy. We will first obtain cardiac clearance from her cardiologist and then plan surgery at [place].  Honestii Marton K. Azreal Stthomas, MD, FACS [facility]  General/ Trauma Surgery   11/27/2019 10:46 PM   

## 2019-11-27 NOTE — H&P (Signed)
History of Present Illness  The patient is a 86 year old female who presents with breast cancer. Oncology - Dr. Truitt Merle PCP - Dr. Domenick Gong Cardiology - Dr. Virgina Jock  This is a pleasant 86 year old female who is s/p right mastectomy in 2007 by Dr. Bubba Camp for DCIS and s/p partial gastrectomy 1997 for GIST. She presents with a left breast mass detected on screening mammogram. This revealed a mass at 12:00 4cmfn measuring 2.3 x 1.7 x 1.4 cm. Just superior to this mass, there is an additional 8 x 7 x 6 mm mass. The axilla was negative. Biopsy of the larger mass confirmed IDC ER/PR +, Her 2 -, Ki67 - 10%. She is scheduled for breast MRI on 10/10. She has been seen by Oncology and is not felt to be a candidate for chemotherapy. She comes in today to discuss surgical options. She may be a candidate for adjuvant antiestrogen therapy, although she did not tolerate this well previously.   CLINICAL DATA: 86 year old female recalled from screening mammogram dated 11/02/2019 for a left breast mass. At the time of evaluation, the patient states she also has a palpable lump in her right mastectomy bed.  EXAM: DIGITAL DIAGNOSTIC LEFT MAMMOGRAM WITH CAD AND TOMO  ULTRASOUND LEFT BREAST  COMPARISON: Previous exam(s).  ACR Breast Density Category c: The breast tissue is heterogeneously dense, which may obscure small masses.  FINDINGS: There is a persistent irregular, spiculated hyperintense mass in the superior left breast. An asymmetry with questionable associated distortion is seen just superior to the index lesion in the MLO projection. Further evaluation with ultrasound was performed.  Mammographic images were processed with CAD.  Targeted ultrasound is performed, showing an irregular hypoechoic mass with associated vascularity at the 12 o'clock position 4 cm from the nipple. It measures 2.3 x 1.7 x 1.4 cm. This correlates well with the mammographic finding. An additional  vague, irregular hyperechoic mass is seen just superior to the index lesion at the 12 o'clock position 4 cm from the nipple. It measures 8 x 7 x 6 mm. There is no internal vascularity. This may correlate with the additional questioned asymmetry noted mammographically.  Evaluation of the left axilla demonstrates no suspicious lymphadenopathy. Evaluation in the right mastectomy bed at the site of the patient's palpable lump demonstrates a prominent underlying rib. No suspicious findings identified.  IMPRESSION: 1. Highly suspicious left breast mass at the 12 o'clock position 4 cm from the nipple. Recommendation is for ultrasound-guided biopsy. 2. Indeterminate hyperechoic mass at the 12 o'clock position 4 cm from the nipple, just superior to the index lesion. This may correspond with an additional asymmetry seen mammographically. Recommendation is for ultrasound-guided biopsy with close attention on post clip films to ensure mammographic correlation. 3. No suspicious left axillary lymphadenopathy. 4. No suspicious findings at the site of the right mastectomy bed palpable lump.  RECOMMENDATION: Two area ultrasound-guided biopsy of the left breast.  I have discussed the findings and recommendations with the patient. If applicable, a reminder letter will be sent to the patient regarding the next appointment.  BI-RADS CATEGORY 5: Highly suggestive of malignancy.   Electronically Signed By: Kristopher Oppenheim M.D. On: 11/09/2019 14:58     Problem List/Past Medical INVASIVE DUCTAL CARCINOMA OF LEFT BREAST IN FEMALE (C50.912) HISTORY OF RIGHT BREAST CANCER (Z85.3)  Allergies  Sulfa Antibiotics Allergies Reconciled  Medication History  amLODIPine Besylate (5MG Tablet, Oral) Active. Carvedilol (6.25MG Tablet, Oral) Active. glipiZIDE ER (10MG Tablet ER 24HR, Oral) Active.  hydroCHLOROthiazide (25MG Tablet, Oral) Active. Jentadueto (2.5-500MG Tablet, Oral)  Active. Lisinopril (40MG Tablet, Oral) Active. Pravastatin Sodium (40MG Tablet, Oral) Active. Medications Reconciled    Vitals  Weight: 146.5 lb Height: 69in Body Surface Area: 1.81 m Body Mass Index: 21.63 kg/m  Temp.: 96.4F Pulse: 101 (Regular)  BP: 126/72(Sitting, Left Arm, Standard)        Physical Exam   The physical exam findings are as follows: Note:Constitutional: WDWN in NAD, conversant, younger that stated age; no obvious deformities; resting comfortably Eyes: Pupils equal, round; sclera anicteric; moist conjunctiva; no lid lag HENT: Oral mucosa moist; good dentition Neck: No masses palpated, trachea midline; no thyromegaly Lungs: CTA bilaterally; normal respiratory effort Chest - right mastectomy site well-healed with no palpable masses or axillary lymphadenopathy Left breast - no axillary lymphadenopathy; no nipple discharge; firm palpable 3 cm mass in upper breast at 12:00 with slight ecchymosis CV: Regular rate and rhythm; no murmurs; extremities well-perfused with no edema Abd: +bowel sounds, soft, non-tender, no palpable organomegaly; no palpable hernias Musc: Normal gait; no apparent clubbing or cyanosis in extremities Lymphatic: No palpable cervical or axillary lymphadenopathy Skin: Warm, dry; no sign of jaundice Psychiatric - alert and oriented x 4; calm mood and affect    Assessment & Plan   INVASIVE DUCTAL CARCINOMA OF LEFT BREAST IN FEMALE (C50.912) Impression: 12:00 4 cmfn ER/PR +, Her2 - Ki67-10%   HISTORY OF RIGHT BREAST CANCER (Z85.3)  Current Plans Schedule for Surgery - Left mastectomy. The surgical procedure has been discussed with the patient. Potential risks, benefits, alternative treatments, and expected outcomes have been explained. All of the patient's questions at this time have been answered. The likelihood of reaching the patient's treatment goal is good. The patient understand the proposed surgical procedure and  wishes to proceed.  Cardiac clearance first from Dr. Patwardhan  Note:This would be a fairly generous lumpectomy with probably an unsatisfactory cosmetic outcome. Therefore, after discussion with the patient, we will plan a left mastectomy. We will not perform SLNB since she is not going to consider chemotherapy. We will first obtain cardiac clearance from her cardiologist and then plan surgery at [facility].  Matthew K. Tsuei, MD, FACS [facility]  General/ Trauma Surgery   11/27/2019 10:46 PM   

## 2019-11-29 ENCOUNTER — Ambulatory Visit
Admission: RE | Admit: 2019-11-29 | Discharge: 2019-11-29 | Disposition: A | Discharge status: Home / Self Care | Payer: Medicare Other | Source: Ambulatory Visit | Attending: Nurse Practitioner | Admitting: Nurse Practitioner

## 2019-11-29 DIAGNOSIS — C50912 Malignant neoplasm of unspecified site of left female breast: Secondary | ICD-10-CM

## 2019-11-29 DIAGNOSIS — N6323 Unspecified lump in the left breast, lower outer quadrant: Secondary | ICD-10-CM | POA: Diagnosis not present

## 2019-11-29 DIAGNOSIS — Z170 Estrogen receptor positive status [ER+]: Secondary | ICD-10-CM

## 2019-11-29 MED ORDER — GADOBUTROL 1 MMOL/ML IV SOLN
7.0000 mL | Freq: Once | INTRAVENOUS | Status: AC | PRN
  Administered 2019-11-29: 7 mL via INTRAVENOUS

## 2019-11-30 ENCOUNTER — Encounter: Payer: Self-pay | Admitting: *Deleted

## 2019-11-30 ENCOUNTER — Telehealth: Payer: Self-pay | Admitting: *Deleted

## 2019-11-30 NOTE — Telephone Encounter (Signed)
Left vm regarding navigation resources and contact information.

## 2019-12-02 ENCOUNTER — Other Ambulatory Visit: Payer: Self-pay

## 2019-12-02 ENCOUNTER — Encounter (HOSPITAL_BASED_OUTPATIENT_CLINIC_OR_DEPARTMENT_OTHER): Payer: Self-pay | Admitting: Surgery

## 2019-12-02 NOTE — Progress Notes (Signed)
Patient's chart and all cardiac tests reviewed with Dr Ambrose Pancoast and states no other testing needed for surgery.

## 2019-12-04 ENCOUNTER — Telehealth: Payer: Self-pay | Admitting: Hematology

## 2019-12-04 ENCOUNTER — Ambulatory Visit: Payer: Medicare Other | Admitting: Cardiology

## 2019-12-04 ENCOUNTER — Encounter (HOSPITAL_BASED_OUTPATIENT_CLINIC_OR_DEPARTMENT_OTHER)
Admission: RE | Admit: 2019-12-04 | Discharge: 2019-12-04 | Disposition: A | Discharge status: Home / Self Care | Payer: Medicare Other | Source: Ambulatory Visit | Attending: Surgery | Admitting: Surgery

## 2019-12-04 ENCOUNTER — Other Ambulatory Visit (HOSPITAL_COMMUNITY)
Admission: RE | Admit: 2019-12-04 | Discharge: 2019-12-04 | Disposition: A | Discharge status: Home / Self Care | Payer: Medicare Other | Source: Ambulatory Visit | Attending: Surgery | Admitting: Surgery

## 2019-12-04 ENCOUNTER — Other Ambulatory Visit (HOSPITAL_COMMUNITY)
Admission: RE | Admit: 2019-12-04 | Discharge: 2019-12-04 | Disposition: A | Discharge status: Home / Self Care | Payer: Medicare Other

## 2019-12-04 DIAGNOSIS — Z01812 Encounter for preprocedural laboratory examination: Secondary | ICD-10-CM | POA: Insufficient documentation

## 2019-12-04 DIAGNOSIS — Z20822 Contact with and (suspected) exposure to covid-19: Secondary | ICD-10-CM | POA: Insufficient documentation

## 2019-12-04 LAB — BASIC METABOLIC PANEL
Anion gap: 11 (ref 5–15)
BUN: 23 mg/dL (ref 8–23)
CO2: 24 mmol/L (ref 22–32)
Calcium: 9.9 mg/dL (ref 8.9–10.3)
Chloride: 105 mmol/L (ref 98–111)
Creatinine, Ser: 1.02 mg/dL — ABNORMAL HIGH (ref 0.44–1.00)
GFR, Estimated: 50 mL/min — ABNORMAL LOW (ref >60)
Glucose, Bld: 114 mg/dL — ABNORMAL HIGH (ref 70–99)
Potassium: 4.2 mmol/L (ref 3.5–5.1)
Sodium: 140 mmol/L (ref 135–145)

## 2019-12-04 LAB — SARS CORONAVIRUS 2 (TAT 6-24 HRS): SARS Coronavirus 2: NEGATIVE

## 2019-12-04 NOTE — Telephone Encounter (Signed)
Scheduled apt per 10/14 sch msg - unable to reach pt left  Message for patient with appt date and time

## 2019-12-04 NOTE — Progress Notes (Signed)

## 2019-12-08 ENCOUNTER — Encounter (HOSPITAL_BASED_OUTPATIENT_CLINIC_OR_DEPARTMENT_OTHER): Payer: Self-pay | Admitting: Surgery

## 2019-12-08 ENCOUNTER — Observation Stay (HOSPITAL_BASED_OUTPATIENT_CLINIC_OR_DEPARTMENT_OTHER)
Admission: RE | Admit: 2019-12-08 | Discharge: 2019-12-09 | Disposition: A | Discharge status: Home / Self Care | Payer: Medicare Other | Attending: Surgery | Admitting: Surgery

## 2019-12-08 ENCOUNTER — Encounter (HOSPITAL_BASED_OUTPATIENT_CLINIC_OR_DEPARTMENT_OTHER)
Admission: RE | Disposition: A | Discharge status: Home / Self Care | Payer: Self-pay | Source: Home / Self Care | Attending: Surgery

## 2019-12-08 ENCOUNTER — Ambulatory Visit (HOSPITAL_BASED_OUTPATIENT_CLINIC_OR_DEPARTMENT_OTHER): Payer: Medicare Other | Admitting: Anesthesiology

## 2019-12-08 ENCOUNTER — Other Ambulatory Visit: Payer: Self-pay

## 2019-12-08 DIAGNOSIS — C50912 Malignant neoplasm of unspecified site of left female breast: Principal | ICD-10-CM | POA: Insufficient documentation

## 2019-12-08 DIAGNOSIS — G8918 Other acute postprocedural pain: Secondary | ICD-10-CM | POA: Diagnosis not present

## 2019-12-08 DIAGNOSIS — I10 Essential (primary) hypertension: Secondary | ICD-10-CM | POA: Diagnosis not present

## 2019-12-08 DIAGNOSIS — Z853 Personal history of malignant neoplasm of breast: Secondary | ICD-10-CM | POA: Diagnosis not present

## 2019-12-08 DIAGNOSIS — Z170 Estrogen receptor positive status [ER+]: Secondary | ICD-10-CM | POA: Diagnosis not present

## 2019-12-08 DIAGNOSIS — Z79899 Other long term (current) drug therapy: Secondary | ICD-10-CM | POA: Diagnosis not present

## 2019-12-08 DIAGNOSIS — E119 Type 2 diabetes mellitus without complications: Secondary | ICD-10-CM | POA: Diagnosis not present

## 2019-12-08 HISTORY — PX: MASTECTOMY, PARTIAL: SHX709

## 2019-12-08 LAB — GLUCOSE, CAPILLARY
Glucose-Capillary: 118 mg/dL — ABNORMAL HIGH (ref 70–99)
Glucose-Capillary: 109 mg/dL — ABNORMAL HIGH (ref 70–99)

## 2019-12-08 SURGERY — MASTECTOMY PARTIAL
Anesthesia: Regional | Site: Breast | Laterality: Left

## 2019-12-08 MED ORDER — FENTANYL CITRATE (PF) 100 MCG/2ML IJ SOLN
100.0000 ug | Freq: Once | INTRAMUSCULAR | Status: AC
  Administered 2019-12-08: 100 ug via INTRAVENOUS

## 2019-12-08 MED ORDER — HYDRALAZINE HCL 20 MG/ML IJ SOLN
10.0000 mg | INTRAMUSCULAR | Status: DC | PRN
  Administered 2019-12-08: 10 mg via INTRAVENOUS

## 2019-12-08 MED ORDER — ONDANSETRON HCL 4 MG/2ML IJ SOLN: 4.0000 mg | Freq: Four times a day (QID) | INTRAMUSCULAR | Status: DC | PRN

## 2019-12-08 MED ORDER — SODIUM CHLORIDE (PF) 0.9 % IJ SOLN
INTRAMUSCULAR | Status: AC
  Filled 2019-12-08: qty 10

## 2019-12-08 MED ORDER — LISINOPRIL 40 MG PO TABS
40.0000 mg | ORAL_TABLET | Freq: Every day | ORAL | Status: DC
  Filled 2019-12-08: qty 1

## 2019-12-08 MED ORDER — DIPHENHYDRAMINE HCL 50 MG/ML IJ SOLN: 12.5000 mg | Freq: Four times a day (QID) | INTRAMUSCULAR | Status: DC | PRN

## 2019-12-08 MED ORDER — HYDROCHLOROTHIAZIDE 25 MG PO TABS
25.0000 mg | ORAL_TABLET | Freq: Every day | ORAL | Status: DC
  Filled 2019-12-08: qty 1

## 2019-12-08 MED ORDER — GLIPIZIDE ER 10 MG PO TB24
10.0000 mg | ORAL_TABLET | Freq: Every day | ORAL | Status: DC
  Filled 2019-12-08: qty 1

## 2019-12-08 MED ORDER — BUPIVACAINE LIPOSOME 1.3 % IJ SUSP
INTRAMUSCULAR | Status: DC | PRN
  Administered 2019-12-08: 10 mL

## 2019-12-08 MED ORDER — CHLORHEXIDINE GLUCONATE CLOTH 2 % EX PADS: 6.0000 | MEDICATED_PAD | Freq: Once | CUTANEOUS | Status: DC

## 2019-12-08 MED ORDER — BUPIVACAINE HCL (PF) 0.5 % IJ SOLN
INTRAMUSCULAR | Status: DC | PRN
  Administered 2019-12-08: 20 mL

## 2019-12-08 MED ORDER — ACETAMINOPHEN 325 MG PO TABS: 650.0000 mg | ORAL_TABLET | Freq: Four times a day (QID) | ORAL | Status: DC | PRN

## 2019-12-08 MED ORDER — DIPHENHYDRAMINE HCL 12.5 MG/5ML PO ELIX: 12.5000 mg | ORAL_SOLUTION | Freq: Four times a day (QID) | ORAL | Status: DC | PRN

## 2019-12-08 MED ORDER — ACETAMINOPHEN 500 MG PO TABS
1000.0000 mg | ORAL_TABLET | ORAL | Status: AC
  Administered 2019-12-08: 1000 mg via ORAL

## 2019-12-08 MED ORDER — MIDAZOLAM HCL 2 MG/2ML IJ SOLN
INTRAMUSCULAR | Status: AC
  Filled 2019-12-08: qty 2

## 2019-12-08 MED ORDER — HYDROCODONE-ACETAMINOPHEN 5-325 MG PO TABS: 1.0000 | ORAL_TABLET | ORAL | Status: DC | PRN

## 2019-12-08 MED ORDER — ACETAMINOPHEN 500 MG PO TABS
ORAL_TABLET | ORAL | Status: AC
  Filled 2019-12-08: qty 2

## 2019-12-08 MED ORDER — ONDANSETRON HCL 4 MG/2ML IJ SOLN
INTRAMUSCULAR | Status: DC | PRN
  Administered 2019-12-08: 4 mg via INTRAVENOUS

## 2019-12-08 MED ORDER — DEXAMETHASONE SODIUM PHOSPHATE 10 MG/ML IJ SOLN
INTRAMUSCULAR | Status: DC | PRN
  Administered 2019-12-08: 10 mg via INTRAVENOUS

## 2019-12-08 MED ORDER — LIDOCAINE 2% (20 MG/ML) 5 ML SYRINGE
INTRAMUSCULAR | Status: DC | PRN
  Administered 2019-12-08: 50 mg via INTRAVENOUS

## 2019-12-08 MED ORDER — CEFAZOLIN SODIUM-DEXTROSE 2-4 GM/100ML-% IV SOLN
INTRAVENOUS | Status: AC
  Filled 2019-12-08: qty 100

## 2019-12-08 MED ORDER — FENTANYL CITRATE (PF) 100 MCG/2ML IJ SOLN
INTRAMUSCULAR | Status: AC
  Filled 2019-12-08: qty 2

## 2019-12-08 MED ORDER — 0.9 % SODIUM CHLORIDE (POUR BTL) OPTIME
TOPICAL | Status: DC | PRN
  Administered 2019-12-08: 1000 mL

## 2019-12-08 MED ORDER — LINAGLIPTIN 5 MG PO TABS
2.5000 mg | ORAL_TABLET | Freq: Two times a day (BID) | ORAL | Status: DC
  Filled 2019-12-08: qty 1

## 2019-12-08 MED ORDER — AMLODIPINE BESYLATE 5 MG PO TABS
5.0000 mg | ORAL_TABLET | Freq: Every day | ORAL | Status: DC
  Filled 2019-12-08: qty 1

## 2019-12-08 MED ORDER — PROPOFOL 10 MG/ML IV BOLUS
INTRAVENOUS | Status: DC | PRN
  Administered 2019-12-08: 140 mg via INTRAVENOUS

## 2019-12-08 MED ORDER — POTASSIUM CHLORIDE CRYS ER 20 MEQ PO TBCR
20.0000 meq | EXTENDED_RELEASE_TABLET | Freq: Every day | ORAL | Status: DC
  Filled 2019-12-08: qty 1

## 2019-12-08 MED ORDER — HYDROCODONE-ACETAMINOPHEN 5-325 MG PO TABS: 1.0000 | ORAL_TABLET | Freq: Four times a day (QID) | ORAL | 0 refills | Status: DC | PRN

## 2019-12-08 MED ORDER — GABAPENTIN 300 MG PO CAPS: 300.0000 mg | ORAL_CAPSULE | ORAL | Status: DC

## 2019-12-08 MED ORDER — GABAPENTIN 300 MG PO CAPS
ORAL_CAPSULE | ORAL | Status: AC
  Filled 2019-12-08: qty 1

## 2019-12-08 MED ORDER — PRAVASTATIN SODIUM 40 MG PO TABS
40.0000 mg | ORAL_TABLET | Freq: Every evening | ORAL | Status: DC
  Administered 2019-12-08: 40 mg via ORAL
  Filled 2019-12-08: qty 1

## 2019-12-08 MED ORDER — ONDANSETRON HCL 4 MG/2ML IJ SOLN: 4.0000 mg | Freq: Once | INTRAMUSCULAR | Status: DC | PRN

## 2019-12-08 MED ORDER — CARVEDILOL 6.25 MG PO TABS
6.2500 mg | ORAL_TABLET | Freq: Two times a day (BID) | ORAL | Status: DC
  Filled 2019-12-08: qty 1

## 2019-12-08 MED ORDER — TRAMADOL HCL 50 MG PO TABS: 50.0000 mg | ORAL_TABLET | Freq: Four times a day (QID) | ORAL | Status: DC | PRN

## 2019-12-08 MED ORDER — ONDANSETRON 4 MG PO TBDP: 4.0000 mg | ORAL_TABLET | Freq: Four times a day (QID) | ORAL | Status: DC | PRN

## 2019-12-08 MED ORDER — CEFAZOLIN SODIUM-DEXTROSE 2-4 GM/100ML-% IV SOLN
2.0000 g | INTRAVENOUS | Status: AC
  Administered 2019-12-08: 2 g via INTRAVENOUS

## 2019-12-08 MED ORDER — FENTANYL CITRATE (PF) 100 MCG/2ML IJ SOLN
INTRAMUSCULAR | Status: DC | PRN
  Administered 2019-12-08: 50 ug via INTRAVENOUS
  Administered 2019-12-08 (×2): 25 ug via INTRAVENOUS

## 2019-12-08 MED ORDER — HYDRALAZINE HCL 20 MG/ML IJ SOLN
10.0000 mg | Freq: Once | INTRAMUSCULAR | Status: AC
  Administered 2019-12-08: 10 mg via INTRAVENOUS

## 2019-12-08 MED ORDER — MORPHINE SULFATE (PF) 4 MG/ML IV SOLN: 2.0000 mg | INTRAVENOUS | Status: DC | PRN

## 2019-12-08 MED ORDER — LACTATED RINGERS IV SOLN: INTRAVENOUS | Status: DC

## 2019-12-08 MED ORDER — ACETAMINOPHEN 325 MG RE SUPP: 650.0000 mg | Freq: Four times a day (QID) | RECTAL | Status: DC | PRN

## 2019-12-08 MED ORDER — HYDRALAZINE HCL 20 MG/ML IJ SOLN
INTRAMUSCULAR | Status: AC
  Filled 2019-12-08: qty 1

## 2019-12-08 MED ORDER — LINAGLIPTIN-METFORMIN HCL 2.5-500 MG PO TABS: 1.0000 | ORAL_TABLET | Freq: Two times a day (BID) | ORAL | Status: DC

## 2019-12-08 MED ORDER — SODIUM CHLORIDE 0.9 % IV SOLN: INTRAVENOUS | Status: DC

## 2019-12-08 MED ORDER — AMISULPRIDE (ANTIEMETIC) 5 MG/2ML IV SOLN: 10.0000 mg | Freq: Once | INTRAVENOUS | Status: DC | PRN

## 2019-12-08 MED ORDER — FENTANYL CITRATE (PF) 100 MCG/2ML IJ SOLN: 25.0000 ug | INTRAMUSCULAR | Status: DC | PRN

## 2019-12-08 MED ORDER — METFORMIN HCL 500 MG PO TABS
500.0000 mg | ORAL_TABLET | Freq: Two times a day (BID) | ORAL | Status: DC
  Administered 2019-12-08: 500 mg via ORAL
  Filled 2019-12-08: qty 1

## 2019-12-08 MED ORDER — EPHEDRINE SULFATE-NACL 50-0.9 MG/10ML-% IV SOSY
PREFILLED_SYRINGE | INTRAVENOUS | Status: DC | PRN
  Administered 2019-12-08: 20 mg via INTRAVENOUS

## 2019-12-08 MED ORDER — METHYLENE BLUE 0.5 % INJ SOLN
INTRAVENOUS | Status: AC
  Filled 2019-12-08: qty 10

## 2019-12-08 SURGICAL SUPPLY — 57 items
APL PRP STRL LF DISP 70% ISPRP (MISCELLANEOUS) ×1
APL SKNCLS STERI-STRIP NONHPOA (GAUZE/BANDAGES/DRESSINGS) ×1
APPLIER CLIP 11 MED OPEN (CLIP) ×2
APPLIER CLIP 9.375 MED OPEN (MISCELLANEOUS)
APR CLP MED 11 20 MLT OPN (CLIP) ×1
APR CLP MED 9.3 20 MLT OPN (MISCELLANEOUS)
BENZOIN TINCTURE PRP APPL 2/3 (GAUZE/BANDAGES/DRESSINGS) ×2 IMPLANT
BLADE HEX COATED 2.75 (ELECTRODE) ×2 IMPLANT
BLADE SURG 10 STRL SS (BLADE) ×2 IMPLANT
BLADE SURG 15 STRL LF DISP TIS (BLADE) ×1 IMPLANT
BLADE SURG 15 STRL SS (BLADE) ×2
CANISTER SUCT 1200ML W/VALVE (MISCELLANEOUS) ×2 IMPLANT
CHLORAPREP W/TINT 26 (MISCELLANEOUS) ×2 IMPLANT
CLIP APPLIE 11 MED OPEN (CLIP) IMPLANT
CLIP APPLIE 9.375 MED OPEN (MISCELLANEOUS) IMPLANT
COVER BACK TABLE 60X90IN (DRAPES) ×2 IMPLANT
COVER MAYO STAND STRL (DRAPES) ×2 IMPLANT
COVER PROBE W GEL 5X96 (DRAPES) IMPLANT
COVER WAND RF STERILE (DRAPES) IMPLANT
DECANTER SPIKE VIAL GLASS SM (MISCELLANEOUS) ×2 IMPLANT
DRAIN CHANNEL 19F RND (DRAIN) ×2 IMPLANT
DRAIN HEMOVAC 1/8 X 5 (WOUND CARE) IMPLANT
DRAPE LAPAROSCOPIC ABDOMINAL (DRAPES) ×2 IMPLANT
DRAPE UTILITY XL STRL (DRAPES) ×2 IMPLANT
DRSG PAD ABDOMINAL 8X10 ST (GAUZE/BANDAGES/DRESSINGS) ×1 IMPLANT
DRSG TEGADERM 4X4.75 (GAUZE/BANDAGES/DRESSINGS) ×1 IMPLANT
ELECT REM PT RETURN 9FT ADLT (ELECTROSURGICAL) ×2
ELECTRODE REM PT RTRN 9FT ADLT (ELECTROSURGICAL) ×1 IMPLANT
EVACUATOR SILICONE 100CC (DRAIN) ×2 IMPLANT
GAUZE SPONGE 4X4 12PLY STRL (GAUZE/BANDAGES/DRESSINGS) ×2 IMPLANT
GAUZE SPONGE 4X4 12PLY STRL LF (GAUZE/BANDAGES/DRESSINGS) IMPLANT
GLOVE BIO SURGEON STRL SZ7 (GLOVE) ×2 IMPLANT
GLOVE BIOGEL PI IND STRL 7.5 (GLOVE) ×1 IMPLANT
GLOVE BIOGEL PI INDICATOR 7.5 (GLOVE) ×1
GOWN STRL REUS W/ TWL LRG LVL3 (GOWN DISPOSABLE) ×1 IMPLANT
GOWN STRL REUS W/TWL LRG LVL3 (GOWN DISPOSABLE) ×2
NDL HYPO 25X1 1.5 SAFETY (NEEDLE) ×2 IMPLANT
NDL SAFETY ECLIPSE 18X1.5 (NEEDLE) ×1 IMPLANT
NEEDLE HYPO 18GX1.5 SHARP (NEEDLE) ×2
NEEDLE HYPO 25X1 1.5 SAFETY (NEEDLE) ×4 IMPLANT
NS IRRIG 1000ML POUR BTL (IV SOLUTION) ×1 IMPLANT
PACK BASIN DAY SURGERY FS (CUSTOM PROCEDURE TRAY) ×2 IMPLANT
PENCIL SMOKE EVACUATOR (MISCELLANEOUS) ×2 IMPLANT
PIN SAFETY STERILE (MISCELLANEOUS) ×2 IMPLANT
SLEEVE SCD COMPRESS KNEE MED (MISCELLANEOUS) ×2 IMPLANT
SPONGE LAP 18X18 RF (DISPOSABLE) ×2 IMPLANT
SPONGE LAP 4X18 RFD (DISPOSABLE) ×1 IMPLANT
STAPLER VISISTAT 35W (STAPLE) ×2 IMPLANT
STRIP CLOSURE SKIN 1/2X4 (GAUZE/BANDAGES/DRESSINGS) ×2 IMPLANT
SUT ETHILON 2 0 FS 18 (SUTURE) ×2 IMPLANT
SUT MON AB 4-0 PS1 27 (SUTURE) ×1 IMPLANT
SUT SILK 2 0 SH (SUTURE) ×2 IMPLANT
SUT VICRYL 3-0 CR8 SH (SUTURE) ×2 IMPLANT
SYR CONTROL 10ML LL (SYRINGE) ×4 IMPLANT
TOWEL GREEN STERILE FF (TOWEL DISPOSABLE) ×4 IMPLANT
TUBE CONNECTING 20X1/4 (TUBING) ×2 IMPLANT
YANKAUER SUCT BULB TIP NO VENT (SUCTIONS) ×2 IMPLANT

## 2019-12-08 NOTE — Anesthesia Postprocedure Evaluation (Signed)
Anesthesia Post Note  Patient: Cindy Wong  Procedure(s) Performed: LEFT MASTECTOMY (Left Breast)     Patient location during evaluation: PACU Anesthesia Type: Regional and General Level of consciousness: sedated Pain management: pain level controlled Vital Signs Assessment: post-procedure vital signs reviewed and stable Respiratory status: spontaneous breathing and respiratory function stable Cardiovascular status: stable Postop Assessment: no apparent nausea or vomiting Anesthetic complications: no   No complications documented.  Last Vitals:  Vitals:   12/08/19 1515 12/08/19 1528  BP: (!) 142/62 (!) 160/66  Pulse: 80 73  Resp: 16 16  Temp:  36.7 C  SpO2: 100% 97%    Last Pain:  Vitals:   12/08/19 1528  TempSrc:   PainSc: 0-No pain                 Merlinda Frederick

## 2019-12-08 NOTE — Progress Notes (Signed)
Assisted Dr. Bass with left, ultrasound guided, pectoralis block. Side rails up, monitors on throughout procedure. See vital signs in flow sheet. Tolerated Procedure well. °

## 2019-12-08 NOTE — Anesthesia Procedure Notes (Signed)
Procedure Name: LMA Insertion Date/Time: 12/08/2019 12:19 PM Performed by: [place] Sylvain Hasten C, CRNA Pre-anesthesia Checklist: Patient identified, Emergency Drugs available, Suction available and Patient being monitored Patient Re-evaluated:Patient Re-evaluated prior to induction Oxygen Delivery Method: Circle system utilized Preoxygenation: Pre-oxygenation with 100% oxygen Induction Type: IV induction Ventilation: Mask ventilation without difficulty LMA: LMA inserted LMA Size: 4.0 Number of attempts: 1 Airway Equipment and Method: Bite block Placement Confirmation: positive ETCO2 Tube secured with: Tape Dental Injury: Teeth and Oropharynx as per pre-operative assessment

## 2019-12-08 NOTE — Interval H&P Note (Signed)
History and Physical Interval Note:  12/08/2019 12:05 PM  Cindy Wong  has presented today for surgery, with the diagnosis of LEFT BEST INVASIVE DUCTAL CARCINOMA.  The various methods of treatment have been discussed with the patient and family. After consideration of risks, benefits and other options for treatment, the patient has consented to  Procedure(s) with comments: LEFT MASTECTOMY (Left) - LMA AND PECTORAL BLOCK as a surgical intervention.  The patient's history has been reviewed, patient examined, no change in status, stable for surgery.  I have reviewed the patient's chart and labs.  Questions were answered to the patient's satisfaction.     [name]

## 2019-12-08 NOTE — Anesthesia Procedure Notes (Signed)
Anesthesia Regional Block: Pectoralis block   Pre-Anesthetic Checklist: ,, timeout performed, Correct Patient, Correct Site, Correct Laterality, Correct Procedure, Correct Position, site marked, Risks and benefits discussed,  Surgical consent,  Pre-op evaluation,  At surgeon's request and post-op pain management  Laterality: Left  Prep: chloraprep       Needles:  Injection technique: Single-shot  Needle Type: Echogenic Stimulator Needle     Needle Length: 10cm  Needle Gauge: 20     Additional Needles:   Procedures:,,,, ultrasound used (permanent image in chart),,,,  Narrative:  Start time: 12/08/2019 10:55 AM End time: 12/08/2019 11:00 AM Injection made incrementally with aspirations every 5 mL.  Performed by: Personally  Anesthesiologist: Merlinda Frederick, MD  Additional Notes: A functioning IV was confirmed and monitors were applied.  Sterile prep and drape, hand hygiene and sterile gloves were used.  Negative aspiration and test dose prior to incremental administration of local anesthetic. The patient tolerated the procedure well.Ultrasound  guidance: relevant anatomy identified, needle position confirmed, local anesthetic spread visualized around nerve(s), vascular puncture avoided.  Image printed for medical record.

## 2019-12-08 NOTE — Anesthesia Preprocedure Evaluation (Addendum)
Anesthesia Evaluation  Patient identified by MRN, date of birth, ID band Patient awake    Reviewed: Allergy & Precautions, NPO status , Patient's Chart, lab work & pertinent test results  Airway Mallampati: II  TM Distance: >3 FB Neck ROM: Full    Dental   Crowns/caps:   Pulmonary neg pulmonary ROS,    Pulmonary exam normal breath sounds clear to auscultation       Cardiovascular Exercise Tolerance: Good hypertension,  Rhythm:Regular Rate:Normal  EKG 06/04/2019: Sinus rhythm 72 bpm. Ventricular bigeminy  Left atrial enlargement.     Neuro/Psych PSYCHIATRIC DISORDERS Anxiety Depression negative neurological ROS     GI/Hepatic negative GI ROS, Neg liver ROS,   Endo/Other  diabetes  Renal/GU negative Renal ROS     Musculoskeletal  (+) Arthritis ,   Abdominal Normal abdominal exam  (+)   Peds  Hematology negative hematology ROS (+)   Anesthesia Other Findings H/o breast and stomach cancer  Reproductive/Obstetrics                            Anesthesia Physical Anesthesia Plan  ASA: III  Anesthesia Plan: General and Regional   Post-op Pain Management: GA combined w/ Regional for post-op pain   Induction:   PONV Risk Score and Plan: 3 and Dexamethasone, Ondansetron and Treatment may vary due to age or medical condition  Airway Management Planned: LMA  Additional Equipment:   Intra-op Plan:   Post-operative Plan:   Informed Consent: I have reviewed the patients History and Physical, chart, labs and discussed the procedure including the risks, benefits and alternatives for the proposed anesthesia with the patient or authorized representative who has indicated his/her understanding and acceptance.     Dental advisory given  Plan Discussed with:   Anesthesia Plan Comments: (PEC block with Exparel. GA/LMA)       Anesthesia Quick Evaluation

## 2019-12-08 NOTE — Transfer of Care (Signed)
Immediate Anesthesia Transfer of Care Note  Patient: Cindy Wong  Procedure(s) Performed: LEFT MASTECTOMY (Left Breast)  Patient Location: PACU  Anesthesia Type:General  Level of Consciousness: awake and drowsy  Airway & Oxygen Therapy: Patient Spontanous Breathing and Patient connected to face mask oxygen  Post-op Assessment: Report given to RN and Post -op Vital signs reviewed and stable  Post vital signs: Reviewed and stable  Last Vitals:  Vitals Value Taken Time  BP 177/80 12/08/19 1331  Temp    Pulse 59 12/08/19 1333  Resp 12 12/08/19 1333  SpO2 100 % 12/08/19 1333  Vitals shown include unvalidated device data.  Last Pain:  Vitals:   12/08/19 1030  TempSrc: Oral  PainSc: 0-No pain         Complications: No complications documented.

## 2019-12-08 NOTE — Op Note (Signed)
Preop diagnosis: Invasive ductal carcinoma left breast, history of DCIS right breast status post right mastectomy  Postop diagnosis: Same Procedure performed: Left mastectomy Surgeon:Skilar Marcou K Phinley Schall Anesthesia: General via LMA with pectoralis block Indications: This is an 86 year old female who is status post right mastectomy in 2007 for DCIS.  She presented with a large palpable mass in her left breast.  Biopsy confirmed invasive ductal carcinoma.  She presents now for mastectomy.  We will not perform sentinel lymph node biopsy as the patient does not want to even consider chemotherapy.  Description of procedure: The patient is brought to the operating room and placed in the supine position on the operating room table.  After an adequate level of general anesthesia was obtained, her left chest was prepped with ChloraPrep and draped in sterile fashion.  A timeout was taken to ensure the proper patient and proper procedure.  I outlined an elliptical incision to include the left nipple areolar complex.  We made a skin incision.  Cautery was then used to raise skin flaps medially to the edge of the sternum.  Superiorly we dissected to the chest wall below the clavicle.  Inferiorly we dissected to the inframammary crease.  Laterally we dissected to the anterior edge of the latissimus muscle.  Cautery was then used to dissect the breast tissue off of the underlying pectoralis muscle.  We took the anterior layer of pectoralis fascia.  The entire specimen was removed and oriented with a long suture lateral and a short suture superior.  We inspected for hemostasis.  A 19 French drain was placed through a stab incision laterally and placed along the inframammary crease.  This was secured with 2-0 Ethilon.  3-0 Vicryl sutures were used to tack some of the lateral dermis down to the chest wall.  3-0 Vicryl sutures were used to close the subcutaneous tissues.  4-0 Monocryl was used to close the skin.  Benzoin and  Steri-Strips were applied.  The drain was placed to suction.  A dry dressing is applied.  The patient was then extubated and brought to the recovery room in stable condition.  All sponge, instrument, and needle counts are correct.  Imogene Burn. Georgette Dover, MD, [facility]  General/ Trauma Surgery   12/08/2019 1:28 PM

## 2019-12-09 ENCOUNTER — Ambulatory Visit: Payer: Medicare Other | Admitting: Cardiology

## 2019-12-09 ENCOUNTER — Encounter (HOSPITAL_BASED_OUTPATIENT_CLINIC_OR_DEPARTMENT_OTHER): Payer: Self-pay | Admitting: Surgery

## 2019-12-09 NOTE — Discharge Summary (Signed)
Physician Discharge Summary  Patient ID: Cindy Wong MRN: [id number] DOB/AGE: 06/22/1931 86 y.o.  Admit date: 12/08/2019 Discharge date: 12/09/2019  Admission Diagnoses:  Invasive ductal carcinoma - left breast  Discharge Diagnoses: same Active Problems:   Invasive ductal carcinoma of breast, female, left [facility])   Discharged Condition: good  Hospital Course: Left mastectomy on 12/08/19.  She stayed overnight for pain control and monitoring.  She did quite well and is ready for discharge.    Discharge Exam: Blood pressure (!) 177/76, pulse 66, temperature (!) 97 F (36.1 C), resp. rate 18, height 5\' 8"  (1.727 m), weight 65 kg, SpO2 100 %. General appearance: alert, cooperative and no distress Chest wall: left sided chest wall tenderness, viable skin flaps Drain - serosanguinous output - 150 cc since surgery  Disposition: Discharge disposition: 01-Home or Self Care       Discharge Instructions    Call MD for:  persistant nausea and vomiting   Complete by: As directed    Call MD for:  redness, tenderness, or signs of infection (pain, swelling, redness, odor or green/yellow discharge around incision site)   Complete by: As directed    Call MD for:  severe uncontrolled pain   Complete by: As directed    Call MD for:  temperature >100.4   Complete by: As directed    Diet general   Complete by: As directed    Driving Restrictions   Complete by: As directed    Do not drive while taking pain medications   Increase activity slowly   Complete by: As directed    May shower / Bathe   Complete by: As directed      Allergies as of 12/09/2019      Reactions   Sulfonamide Derivatives Hives, Itching   Sulfamethoxazole Rash      Medication List    TAKE these medications   amLODipine 5 MG tablet Commonly known as: NORVASC Take 5 mg by mouth daily. In the morning   carvedilol 6.25 MG tablet Commonly known as: COREG TAKE 1 TABLET(6.25 MG) BY MOUTH TWICE DAILY    CENTRUM SILVER PO Take 1 tablet by mouth daily.   D2000 Ultra Strength 50 MCG (2000 UT) Caps Generic drug: Cholecalciferol Take by mouth daily.   glipiZIDE 10 MG 24 hr tablet Commonly known as: GLUCOTROL XL Take 10 mg by mouth daily.   hydrochlorothiazide 25 MG tablet Commonly known as: HYDRODIURIL Take 25 mg by mouth daily. In the morning   HYDROcodone-acetaminophen 5-325 MG tablet Commonly known as: NORCO/VICODIN Take 1 tablet by mouth every 6 (six) hours as needed for moderate pain.   Jentadueto 2.5-500 MG Tabs Generic drug: linaGLIPtin-metFORMIN HCl Take 1 tablet by mouth 2 (two) times daily.   lisinopril 40 MG tablet Commonly known as: ZESTRIL Take 40 mg by mouth daily. In the morning   OneTouch Verio test strip Generic drug: glucose blood 2 (two) times daily. for testing   potassium chloride SA 20 MEQ tablet Commonly known as: KLOR-CON Take 20 mEq by mouth daily.   pravastatin 40 MG tablet Commonly known as: PRAVACHOL Take 40 mg by mouth every evening.   ROYAL JELLY PO Take by mouth daily.       Follow-up Information    Donnie Mesa, MD. Schedule an appointment as soon as possible for a visit in 1 week(s).   Specialty: General Surgery Why: 12/18/19 - arrive at 9:30 for an appointment with my nurse Contact information: [address]  [address] [place] [address] [phone]               Signed: [name] 12/09/2019, 8:24 AM

## 2019-12-09 NOTE — Discharge Instructions (Signed)
[facility] °[phone] ° °MASTECTOMY: POST OP INSTRUCTIONS ° °Always review your discharge instruction sheet given to you by the facility where your surgery was performed. °IF YOU HAVE DISABILITY OR FAMILY LEAVE FORMS, YOU MUST BRING THEM TO THE OFFICE FOR PROCESSING.   °DO NOT GIVE THEM TO YOUR DOCTOR. °A prescription for pain medication may be given to you upon discharge.  Take your pain medication as prescribed, if needed.  If narcotic pain medicine is not needed, then you may take acetaminophen (Tylenol) or ibuprofen (Advil) as needed. °1. Take your usually prescribed medications unless otherwise directed. °2. If you need a refill on your pain medication, please contact your pharmacy.  They will contact our office to request authorization.  Prescriptions will not be filled after 5pm or on week-ends. °3. You should follow a light diet the first few days after arrival home, such as soup and crackers, etc.  Resume your normal diet the day after surgery. °4. Most patients will experience some swelling and bruising on the chest and underarm.  Ice packs will help.  Swelling and bruising can take several days to resolve.  °5. It is common to experience some constipation if taking pain medication after surgery.  Increasing fluid intake and taking a stool softener (such as Colace) will usually help or prevent this problem from occurring.  A mild laxative (Milk of Magnesia or Miralax) should be taken according to package instructions if there are no bowel movements after 48 hours. °6. Unless discharge instructions indicate otherwise, leave your bandage dry and in place until your next appointment in 3-5 days.  You may take a limited sponge bath.  No tube baths or showers until the drains are removed.  You may have steri-strips (small skin tapes) in place directly over the incision.  These strips should be left on the skin for 7-10 days.  If your surgeon used skin glue on the incision, you may  shower in 24 hours.  The glue will flake off over the next 2-3 weeks.  Any sutures or staples will be removed at the office during your follow-up visit. °7. DRAINS:  If you have drains in place, it is important to keep a list of the amount of drainage produced each day in your drains.  Before leaving the hospital, you should be instructed on drain care.  Call our office if you have any questions about your drains. °8. ACTIVITIES:  You may resume regular (light) daily activities beginning the next day--such as daily self-care, walking, climbing stairs--gradually increasing activities as tolerated.  You may have sexual intercourse when it is comfortable.  Refrain from any heavy lifting or straining until approved by your doctor. °a. You may drive when you are no longer taking prescription pain medication, you can comfortably wear a seatbelt, and you can safely maneuver your car and apply brakes. °b. RETURN TO WORK:  __________________________________________________________ °9. You should see your doctor in the office for a follow-up appointment approximately 3-5 days after your surgery.  Your doctor’s nurse will typically make your follow-up appointment when she calls you with your pathology report.  Expect your pathology report 2-3 business days after your surgery.  You may call to check if you do not hear from us after three days.   °10. OTHER INSTRUCTIONS: ______________________________________________________________________________________________ ____________________________________________________________________________________________ ° °WHEN TO CALL YOUR DOCTOR: °1. Fever over 101.0 °2. Nausea and/or vomiting °3. Extreme swelling or bruising °4. Continued bleeding from incision. °5. Increased pain, redness, or drainage from the   incision. ° °The clinic staff is available to answer your questions during regular business hours.  Please don’t hesitate to call and ask to speak to one of the nurses for clinical  concerns.  If you have a medical emergency, go to the nearest emergency room or call 911.  A surgeon from [facility] is always on call at the hospital. °[address], [address], [place], [place]  [address] ? [address] [address], [place], [place]   [address] °[phone] ? [phone] ? FAX [phone] °Web site: www.[facility] ° °About my Jackson-Pratt Bulb Drain ° °What is a Jackson-Pratt bulb? °A Jackson-Pratt is a soft, round device used to collect drainage. It is connected to a long, thin drainage catheter, which is held in place by one or two small stiches near your surgical incision site. When the bulb is squeezed, it forms a vacuum, forcing the drainage to empty into the bulb. ° °Emptying the Jackson-Pratt bulb- °To empty the bulb: °1. Release the plug on the top of the bulb. °2. Pour the bulb's contents into a measuring container which your nurse will provide. °3. Record the time emptied and amount of drainage. Empty the drain(s) as often as your     doctor or nurse recommends. ° °Date                  Time                    Amount (Drain 1)                 Amount (Drain 2) ° °_____________________________________________________________________ ° °_____________________________________________________________________ ° °_____________________________________________________________________ ° °_____________________________________________________________________ ° °_____________________________________________________________________ ° °_____________________________________________________________________ ° °_____________________________________________________________________ ° °_____________________________________________________________________ ° °Squeezing the Jackson-Pratt Bulb- °To squeeze the bulb: °1. Make sure the plug at the top of the bulb is open. °2. Squeeze the bulb tightly in your fist. You will hear air squeezing from the bulb. °3. Replace the plug while the bulb is squeezed. °4.  Use a safety pin to attach the bulb to your clothing. This will keep the catheter from     pulling at the bulb insertion site. ° °When to call your doctor- °Call your doctor if: °· Drain site becomes red, swollen or hot. °· You have a fever greater than 101 degrees F. °· There is oozing at the drain site. °· Drain falls out (apply a guaze bandage over the drain hole and secure it with tape). °· Drainage increases daily not related to activity patterns. (You will usually have more drainage when you are active than when you are resting.) °· Drainage has a bad odor. ° °

## 2019-12-10 ENCOUNTER — Encounter: Payer: Self-pay | Admitting: *Deleted

## 2019-12-14 ENCOUNTER — Encounter: Payer: Self-pay | Admitting: *Deleted

## 2019-12-14 LAB — SURGICAL PATHOLOGY

## 2019-12-15 ENCOUNTER — Inpatient Hospital Stay: Payer: Medicare Other

## 2019-12-15 ENCOUNTER — Inpatient Hospital Stay: Payer: Medicare Other | Admitting: Genetic Counselor

## 2019-12-17 DIAGNOSIS — Z853 Personal history of malignant neoplasm of breast: Secondary | ICD-10-CM | POA: Diagnosis not present

## 2019-12-17 DIAGNOSIS — F331 Major depressive disorder, recurrent, moderate: Secondary | ICD-10-CM | POA: Diagnosis not present

## 2019-12-17 DIAGNOSIS — Z9010 Acquired absence of unspecified breast and nipple: Secondary | ICD-10-CM | POA: Diagnosis not present

## 2019-12-17 DIAGNOSIS — C50919 Malignant neoplasm of unspecified site of unspecified female breast: Secondary | ICD-10-CM | POA: Diagnosis not present

## 2019-12-17 DIAGNOSIS — E119 Type 2 diabetes mellitus without complications: Secondary | ICD-10-CM | POA: Diagnosis not present

## 2019-12-17 DIAGNOSIS — E7800 Pure hypercholesterolemia, unspecified: Secondary | ICD-10-CM | POA: Diagnosis not present

## 2019-12-17 DIAGNOSIS — I10 Essential (primary) hypertension: Secondary | ICD-10-CM | POA: Diagnosis not present

## 2019-12-17 DIAGNOSIS — F411 Generalized anxiety disorder: Secondary | ICD-10-CM | POA: Diagnosis not present

## 2019-12-20 DIAGNOSIS — I10 Essential (primary) hypertension: Secondary | ICD-10-CM | POA: Diagnosis not present

## 2019-12-25 ENCOUNTER — Inpatient Hospital Stay: Payer: Medicare Other | Attending: Nurse Practitioner | Admitting: Hematology

## 2019-12-25 DIAGNOSIS — Z9012 Acquired absence of left breast and nipple: Secondary | ICD-10-CM | POA: Insufficient documentation

## 2019-12-25 DIAGNOSIS — Z85028 Personal history of other malignant neoplasm of stomach: Secondary | ICD-10-CM | POA: Insufficient documentation

## 2019-12-25 DIAGNOSIS — Z79811 Long term (current) use of aromatase inhibitors: Secondary | ICD-10-CM | POA: Insufficient documentation

## 2019-12-25 DIAGNOSIS — Z923 Personal history of irradiation: Secondary | ICD-10-CM | POA: Insufficient documentation

## 2019-12-25 DIAGNOSIS — Z90721 Acquired absence of ovaries, unilateral: Secondary | ICD-10-CM | POA: Insufficient documentation

## 2019-12-25 DIAGNOSIS — C50812 Malignant neoplasm of overlapping sites of left female breast: Secondary | ICD-10-CM | POA: Insufficient documentation

## 2019-12-25 DIAGNOSIS — Z79899 Other long term (current) drug therapy: Secondary | ICD-10-CM | POA: Insufficient documentation

## 2019-12-25 DIAGNOSIS — Z9011 Acquired absence of right breast and nipple: Secondary | ICD-10-CM | POA: Insufficient documentation

## 2019-12-25 DIAGNOSIS — I10 Essential (primary) hypertension: Secondary | ICD-10-CM | POA: Insufficient documentation

## 2019-12-25 DIAGNOSIS — Z903 Acquired absence of stomach [part of]: Secondary | ICD-10-CM | POA: Insufficient documentation

## 2019-12-25 DIAGNOSIS — Z882 Allergy status to sulfonamides status: Secondary | ICD-10-CM | POA: Insufficient documentation

## 2019-12-25 DIAGNOSIS — Z170 Estrogen receptor positive status [ER+]: Secondary | ICD-10-CM | POA: Insufficient documentation

## 2019-12-25 DIAGNOSIS — Z9049 Acquired absence of other specified parts of digestive tract: Secondary | ICD-10-CM | POA: Insufficient documentation

## 2019-12-28 ENCOUNTER — Encounter: Payer: Self-pay | Admitting: *Deleted

## 2019-12-28 ENCOUNTER — Telehealth: Payer: Self-pay | Admitting: *Deleted

## 2019-12-28 NOTE — Telephone Encounter (Signed)
Left vm with appt date and time for post op on 11/23 at 10:20. Contact information provided for questions or needs.

## 2020-01-05 ENCOUNTER — Encounter: Payer: Medicare Other | Admitting: Obstetrics and Gynecology

## 2020-01-07 DIAGNOSIS — Z23 Encounter for immunization: Secondary | ICD-10-CM | POA: Diagnosis not present

## 2020-01-08 NOTE — Progress Notes (Signed)
[facility]   [phone] [phone]   Clinic Follow up Note   Patient Care Team: Tisovec, Fransico Him, MD as PCP - Philomena Doheny, Paulette Blanch, RN as Oncology Nurse Navigator Rockwell Germany, RN as Oncology Nurse Navigator Truitt Merle, MD as Consulting Physician (Hematology)  Date of Service:  01/12/2020  CHIEF COMPLAINT: F/u of left breast cancer and H/o DCIS right breast (1993, 2007 s/p mastectomy) and h/o GIST (1997 s/p partial gastrectomy)   SUMMARY OF ONCOLOGIC HISTORY: Oncology History Overview Note  Cancer Staging Malignant neoplasm of central left breast [facility]) Staging form: Breast, AJCC 8th Edition - Clinical stage from 11/19/2019: Stage IB (cT2, cN0, cM0, G2, ER+, PR+, HER2-) - Signed by Alla Feeling, NP on 11/23/2019 - Pathologic stage from 12/08/2019: Stage Unknown (pT2, pNX, cM0, G2, ER+, PR+, HER2-) - Signed by Truitt Merle, MD on 12/25/2019    Malignant neoplasm of central left breast ([place])  11/09/2019 Breast US   IMPRESSION: 1. Highly suspicious left breast mass at the 12 o'clock position 4 cm from the nipple. Recommendation is for ultrasound-guided biopsy. 2. Indeterminate hyperechoic mass at the 12 o'clock position 4 cm from the nipple, just superior to the index lesion. This may correspond with an additional asymmetry seen mammographically. Recommendation is for ultrasound-guided biopsy with close attention on post clip films to ensure mammographic correlation. 3. No suspicious left axillary lymphadenopathy. 4. No suspicious findings at the site of the right mastectomy bed palpable lump.   11/19/2019 Cancer Staging   Staging form: Breast, AJCC 8th Edition - Clinical stage from 11/19/2019: Stage IB (cT2, cN0, cM0, G2, ER+, PR+, HER2-) - Signed by Alla Feeling, NP on 11/23/2019   11/19/2019 Initial Biopsy   Diagnosis Breast, left, needle core biopsy, 12 o'clock - INVASIVE MAMMARY CARCINOMA - SEE COMMENT E-cadherin is POSITIVE  supporting a ductal origin. PROGNOSTIC INDICATORS Results: IMMUNOHISTOCHEMICAL AND MORPHOMETRIC ANALYSIS PERFORMED MANUALLY The tumor cells are NEGATIVE for Her2 (1+). Estrogen Receptor: 95%, POSITIVE, STRONG STAINING INTENSITY Progesterone Receptor: 95%, POSITIVE, STRONG STAINING INTENSITY Proliferation Marker Ki67: 10%   11/23/2019 Initial Diagnosis   Malignant neoplasm of left breast ([place])   11/29/2019 Breast MRI   IMPRESSION: 1. 3.1 cm biopsy-proven malignancy within the UPPER LEFT breast. 2. Indeterminate 0.9 cm posterior central/LOWER LEFT breast mass. If breast conservation is desired, 2nd-look ultrasound and possible biopsy is recommended. If this mass is not identified sonographically, than MR guided biopsy would be recommended. 3. No abnormal appearing lymph nodes. 4. RIGHT mastectomy without suspicious abnormalities in the RIGHT mastectomy bed.   12/08/2019 Cancer Staging   Staging form: Breast, AJCC 8th Edition - Pathologic stage from 12/08/2019: Stage Unknown (pT2, pNX, cM0, G2, ER+, PR+, HER2-) - Signed by Truitt Merle, MD on 12/25/2019   12/08/2019 Surgery   Left Mastectomy by Dr Georgette Dover   12/08/2019 Pathology Results   FINAL MICROSCOPIC DIAGNOSIS:   A. BREAST, LEFT, MASTECTOMY:  - Invasive ductal carcinoma, multifocal, 2.8 cm in greatest dimension,  [place] grade 2 of 3.  - Margins of resection are not involved.  - See oncology table.  - See oncology table.    01/2020 -  Anti-estrogen oral therapy   Anastrozole 39m once daily starting 01/2020      CURRENT THERAPY:  Anastrozole 165monce daily starting 01/2020  INTERVAL HISTORY:  Cindy Wong is here for a follow up. She presents to the clinic alone. She notes she did well with surgery. She recovered well. She has followed  up with Dr Georgette Dover 3 weeks ago and plans to f/u with him in February.     REVIEW OF SYSTEMS:   Constitutional: Denies fevers, chills or abnormal weight loss Eyes: Denies  blurriness of vision Ears, nose, mouth, throat, and face: Denies mucositis or sore throat Respiratory: Denies cough, dyspnea or wheezes Cardiovascular: Denies palpitation, chest discomfort or lower extremity swelling Gastrointestinal:  Denies nausea, heartburn or change in bowel habits Skin: Denies abnormal skin rashes Lymphatics: Denies new lymphadenopathy or easy bruising Neurological:Denies numbness, tingling or new weaknesses Behavioral/Psych: Mood is stable, no new changes  All other systems were reviewed with the patient and are negative.  MEDICAL HISTORY:  Past Medical History:  Diagnosis Date  . Adenomatous colon polyp    tubular  . Anxiety   . Arthritis   . Blood transfusion without reported diagnosis   . Breast cancer [facility]) 2007   right mastectomy  . Breast cancer ([place]) 10/2019   left breast IDC  . Cataract   . Depression   . Diabetes mellitus   . Hx of meningitis 2012  . Hyperlipemia   . Hypertension   . Personal history of radiation therapy   . Stomach cancer ([place]) 1997   partial gastrectomy    SURGICAL HISTORY: Past Surgical History:  Procedure Laterality Date  . ABDOMINAL HYSTERECTOMY  1974   Supracervical  . BREAST LUMPECTOMY Right 1993  . BREAST SURGERY     Mastectomy  . CHOLECYSTECTOMY    . COLONOSCOPY    . MASTECTOMY Right   . MASTECTOMY, PARTIAL Left 12/08/2019   Procedure: LEFT MASTECTOMY;  Surgeon: Donnie Mesa, MD;  Location: [place];  Service: General;  Laterality: Left;  LMA AND PECTORAL BLOCK  . meningitis    . OOPHORECTOMY     One ovary removed--?side  . Stomach tumor    . UPPER GASTROINTESTINAL ENDOSCOPY      I have reviewed the social history and family history with the patient and they are unchanged from previous note.  ALLERGIES:  is allergic to sulfonamide derivatives and sulfamethoxazole.  MEDICATIONS:  Current Outpatient Medications  Medication Sig Dispense Refill  . amLODipine (NORVASC) 5 MG tablet Take  5 mg by mouth daily. In the morning    . anastrozole (ARIMIDEX) 1 MG tablet Take 1 tablet (1 mg total) by mouth daily. 30 tablet 3  . carvedilol (COREG) 6.25 MG tablet TAKE 1 TABLET(6.25 MG) BY MOUTH TWICE DAILY 60 tablet 3  . Cholecalciferol (D2000 ULTRA STRENGTH) 50 MCG (2000 UT) CAPS Take by mouth daily.    Marland Kitchen glipiZIDE (GLUCOTROL XL) 10 MG 24 hr tablet Take 10 mg by mouth daily.      . hydrochlorothiazide (HYDRODIURIL) 25 MG tablet Take 25 mg by mouth daily. In the morning  10  . HYDROcodone-acetaminophen (NORCO/VICODIN) 5-325 MG tablet Take 1 tablet by mouth every 6 (six) hours as needed for moderate pain. 20 tablet 0  . Linagliptin-Metformin HCl (JENTADUETO) 2.5-500 MG TABS Take 1 tablet by mouth 2 (two) times daily.     Marland Kitchen lisinopril (PRINIVIL,ZESTRIL) 40 MG tablet Take 40 mg by mouth daily. In the morning  0  . Multiple Vitamins-Minerals (CENTRUM SILVER PO) Take 1 tablet by mouth daily.     Glory Rosebush VERIO test strip 2 (two) times daily. for testing  2  . potassium chloride SA (K-DUR,KLOR-CON) 20 MEQ tablet Take 20 mEq by mouth daily.    . pravastatin (PRAVACHOL) 40 MG tablet Take 40 mg by mouth every  evening.     Marland Kitchen ROYAL JELLY PO Take by mouth daily.     No current facility-administered medications for this visit.    PHYSICAL EXAMINATION: ECOG PERFORMANCE STATUS: 1 - Symptomatic but completely ambulatory  Vitals:   01/12/20 1019  BP: (!) 142/71  Pulse: 76  Resp: 16  Temp: (!) 97.5 F (36.4 C)  SpO2: 100%   Filed Weights   01/12/20 1019  Weight: 145 lb 1.6 oz (65.8 kg)    GENERAL:alert, no distress and comfortable SKIN: skin color, texture, turgor are normal, no rashes or significant lesions EYES: normal, Conjunctiva are pink and non-injected, sclera clear  NECK: supple, thyroid normal size, non-tender, without nodularity LYMPH:  no palpable lymphadenopathy in the cervical, axillary  LUNGS: clear to auscultation and percussion with normal breathing effort HEART:  regular rate & rhythm and no murmurs and no lower extremity edema ABDOMEN:abdomen soft, non-tender and normal bowel sounds Musculoskeletal:no cyanosis of digits and no clubbing  NEURO: alert & oriented x 3 with fluent speech, no focal motor/sensory deficits BREAST: S/p b/l mastectomy with breasts surgically absent: Minimal opening of upper incision healing well.   LABORATORY DATA:  I have reviewed the data as listed CBC Latest Ref Rng & Units 11/23/2019 11/21/2018 05/08/2018  WBC 4.0 - 10.5 K/uL 7.9 7.3 11.0(H)  Hemoglobin 12.0 - 15.0 g/dL 14.8 15.3(H) 13.4  Hematocrit 36 - 46 % 47.1(H) 48.9(H) 43.8  Platelets 150 - 400 K/uL 277 268 245     CMP Latest Ref Rng & Units 12/04/2019 11/23/2019 11/21/2018  Glucose 70 - 99 mg/dL 114(H) 134(H) 101(H)  BUN 8 - 23 mg/dL _0 Creatinine 0.44 - 1.00 mg/dL 1.02(H) 0.93 0.89  Sodium 135 - 145 mmol/L 140 140 142  Potassium 3.5 - 5.1 mmol/L 4.2 3.9 4.1  Chloride 98 - 111 mmol/L 105 106 106  CO2 22 - 32 mmol/L _1 Calcium 8.9 - 10.3 mg/dL 9.9 10.0 10.1  Total Protein 6.5 - 8.1 g/dL - 7.3 7.3  Total Bilirubin 0.3 - 1.2 mg/dL - 0.7 0.5  Alkaline Phos 38 - 126 U/L - 47 55  AST 15 - 41 U/L - 21 18  ALT 0 - 44 U/L - 16 13      RADIOGRAPHIC STUDIES: I have personally reviewed the radiological images as listed and agreed with the findings in the report. No results found.   ASSESSMENT & PLAN:  Cindy Wong is a 86 y.o. female with   1.  Malignant neoplasm of the left breast, grade 2, ER 95% strongly positive, PR 95% strongly positive, HER-2 negative (1+) Ki-67 10%, cT2 N0 M0 stage Ib -She was diagnosed in 10/2019 after screening mammogram. Biopsy showed invasive ductal carcinoma.  -She underwent left mastectomy by Dr Johnny Bridge on 12/08/19. Her surgical path showed grade II 2.8cm of invasive ductal carcinoma was completely removed with clear margins. I reviewed with patient today.  -she does not requires post-mastectomy Radiation.  -Given  the strong ER and PR positivity, I do recommend adjuvant aromatase inhibitor with anastrozole for 5 years to reduce her risk of cancer recurrence.   -The potential benefit and side effects, which includes but not limited to, hot flash, skin and vaginal dryness, metabolic changes ( increased blood glucose, cholesterol, weight, etc.), slightly in increased risk of cardiovascular disease, cataracts, muscular and joint discomfort, osteopenia and osteoporosis, etc, were discussed with her in great details. She is interested in trying. Plan to start in 01/2020. If she  does not tolerate will switch her to other AI or Tamoxifen.  -We also discussed the breast cancer surveillance after her surgery. Given she has s/p b/l mastectomy there is no need for mammograms. She will continue annual self exams, and a routine office visit with lab and exam with [place].  -She will proceed with survivorship clinic with NP Lacie in January, F/u with Dr Georgette Dover in February and F/u with me in 6 months.     2. History of recurrent right breast DCIS  -Diagnosed initially in 1993, s/p lumpectomy and adjuvant radiation. She reportedly tried antiestrogen therapy but did not tolerate well. -She had a recurrence of ER/PR positive DCIS in 07/2005, s/p right mastectomy on 08/24/2005 -She has been compliant with surveillance plan  3. H/O GIST -Diagnosed in 1997, s/p partial gastrectomy, followed by Dr. Ardis Hughs  -no clinical signs of recurrence   4. Bone health  -She received DEXA every 2 years, last was normal. She is due for DEXA in 2021. Will repeat in the next 3 months and continue every 2 years as she plans to start anastrozole which can reduce her bone density. She is agreeable.   5. Genetics  -She has personal h/o recurrent DCIS, GIST and left breast cancer. She qualifies for genetics.  -Sister passed from cancer, unknown type. Does not have children, has sibling and nieces  -She was previously referred to Genetics, but will think  about proceeding.   6. Social Support  -Sister passed in June from cancer, she was depressed for 3-4 months in 2021 but better recently  -She lives alone but has brother who lives in town.   PLAN: -I called in Anastrozole today to start in 01/2020  -Survivorship clinic with NP Lacie in 2 months  -she will see Dr. Georgette Dover in 3 months  -Lab and F/u in 6 months  -DEXA with her PCP in the next 3 months    No problem-specific Assessment & Plan notes found for this encounter.   No orders of the defined types were placed in this encounter.  All questions were answered. The patient knows to call the clinic with any problems, questions or concerns. No barriers to learning was detected. The total time spent in the appointment was 30 minutes.     Truitt Merle, MD 01/12/2020   I, Joslyn Devon, am acting as scribe for Truitt Merle, MD.   I have reviewed the above documentation for accuracy and completeness, and I agree with the above.

## 2020-01-12 ENCOUNTER — Telehealth: Payer: Self-pay | Admitting: Hematology

## 2020-01-12 ENCOUNTER — Other Ambulatory Visit: Payer: Self-pay

## 2020-01-12 ENCOUNTER — Encounter: Payer: Self-pay | Admitting: Hematology

## 2020-01-12 ENCOUNTER — Inpatient Hospital Stay (HOSPITAL_BASED_OUTPATIENT_CLINIC_OR_DEPARTMENT_OTHER): Payer: Medicare Other | Admitting: Hematology

## 2020-01-12 VITALS — BP 142/71 | HR 76 | Temp 97.5°F | Resp 16 | Ht 68.0 in | Wt 145.1 lb

## 2020-01-12 DIAGNOSIS — C50912 Malignant neoplasm of unspecified site of left female breast: Secondary | ICD-10-CM | POA: Diagnosis not present

## 2020-01-12 DIAGNOSIS — Z923 Personal history of irradiation: Secondary | ICD-10-CM | POA: Diagnosis not present

## 2020-01-12 DIAGNOSIS — Z9049 Acquired absence of other specified parts of digestive tract: Secondary | ICD-10-CM | POA: Diagnosis not present

## 2020-01-12 DIAGNOSIS — Z9012 Acquired absence of left breast and nipple: Secondary | ICD-10-CM | POA: Diagnosis not present

## 2020-01-12 DIAGNOSIS — Z170 Estrogen receptor positive status [ER+]: Secondary | ICD-10-CM

## 2020-01-12 DIAGNOSIS — Z9011 Acquired absence of right breast and nipple: Secondary | ICD-10-CM | POA: Diagnosis not present

## 2020-01-12 DIAGNOSIS — I10 Essential (primary) hypertension: Secondary | ICD-10-CM | POA: Diagnosis not present

## 2020-01-12 DIAGNOSIS — C50812 Malignant neoplasm of overlapping sites of left female breast: Secondary | ICD-10-CM | POA: Diagnosis not present

## 2020-01-12 DIAGNOSIS — Z85028 Personal history of other malignant neoplasm of stomach: Secondary | ICD-10-CM | POA: Diagnosis not present

## 2020-01-12 DIAGNOSIS — Z79899 Other long term (current) drug therapy: Secondary | ICD-10-CM | POA: Diagnosis not present

## 2020-01-12 DIAGNOSIS — Z903 Acquired absence of stomach [part of]: Secondary | ICD-10-CM | POA: Diagnosis not present

## 2020-01-12 DIAGNOSIS — Z79811 Long term (current) use of aromatase inhibitors: Secondary | ICD-10-CM | POA: Diagnosis not present

## 2020-01-12 DIAGNOSIS — Z882 Allergy status to sulfonamides status: Secondary | ICD-10-CM | POA: Diagnosis not present

## 2020-01-12 DIAGNOSIS — Z90721 Acquired absence of ovaries, unilateral: Secondary | ICD-10-CM | POA: Diagnosis not present

## 2020-01-12 MED ORDER — ANASTROZOLE 1 MG PO TABS: 1.0000 mg | ORAL_TABLET | Freq: Every day | ORAL | 3 refills | Status: DC

## 2020-01-12 NOTE — Telephone Encounter (Signed)
Scheduled per los. Gave avs and calendar  

## 2020-01-19 ENCOUNTER — Encounter: Payer: Self-pay | Admitting: *Deleted

## 2020-01-19 DIAGNOSIS — I10 Essential (primary) hypertension: Secondary | ICD-10-CM | POA: Diagnosis not present

## 2020-02-16 ENCOUNTER — Other Ambulatory Visit: Payer: Self-pay

## 2020-02-16 ENCOUNTER — Ambulatory Visit (INDEPENDENT_AMBULATORY_CARE_PROVIDER_SITE_OTHER): Payer: Medicare Other | Admitting: Podiatry

## 2020-02-16 ENCOUNTER — Encounter: Payer: Self-pay | Admitting: Podiatry

## 2020-02-16 DIAGNOSIS — B351 Tinea unguium: Secondary | ICD-10-CM

## 2020-02-16 DIAGNOSIS — E1151 Type 2 diabetes mellitus with diabetic peripheral angiopathy without gangrene: Secondary | ICD-10-CM | POA: Diagnosis not present

## 2020-02-16 DIAGNOSIS — M79676 Pain in unspecified toe(s): Secondary | ICD-10-CM

## 2020-02-16 NOTE — Progress Notes (Signed)
This patient returns to my office for at risk foot care.  This patient requires this care by a professional since this patient will be at risk due to having diabetes.   This patient is unable to cut nails herself since the patient cannot reach her nails.  These nails is painful walking and wearing her shoes. . This patient presents for at risk foot care today.  General Appearance  Alert, conversant and in no acute stress.  Vascular  Dorsalis pedis  pulses are palpable  Bilaterally. Posterior tibial pulses are absent  B/L.  Capillary return is within normal limits  bilaterally. Temperature is within normal limits  bilaterally.  Neurologic  Senn-Weinstein monofilament wire test within normal limits  bilaterally. Muscle power within normal limits bilaterally.  Nails Thick disfigured discolored nails with subungual debris  from hallux to fifth toes bilaterally. No evidence of bacterial infection or drainage bilaterally.  Orthopedic  No limitations of motion  feet .  No crepitus or effusions noted.  No bony pathology or digital deformities noted. DJD midfoot  B/L.  Skin  normotropic skin with no porokeratosis noted bilaterally.  No signs of infections or ulcers noted.   Porokeratosis sub 5th met right foot. Asymptomatic.  Onychomycosis  Pain in right toes  Pain in left toes  .  Consent was obtained for treatment procedures.   Mechanical debridement of nails 1-5  bilaterally performed with a nail nipper.  Filed with dremel without incident.    Return office visit    3 months                  Told patient to return for periodic foot care and evaluation due to potential at risk complications.   Gardiner Barefoot DPM

## 2020-02-19 DIAGNOSIS — I10 Essential (primary) hypertension: Secondary | ICD-10-CM | POA: Diagnosis not present

## 2020-03-09 ENCOUNTER — Telehealth: Payer: Self-pay

## 2020-03-09 ENCOUNTER — Inpatient Hospital Stay: Payer: Medicare Other | Admitting: Nurse Practitioner

## 2020-03-09 NOTE — Telephone Encounter (Signed)
Pt needs to reschedule todays appt and would like to reschedule for next week scheduling message sent

## 2020-03-11 ENCOUNTER — Telehealth: Payer: Self-pay | Admitting: Nurse Practitioner

## 2020-03-11 NOTE — Telephone Encounter (Signed)
Rescheduled appointment per 1/21 schedule message. Patient is aware of changes.

## 2020-03-16 ENCOUNTER — Ambulatory Visit: Payer: Medicare Other | Admitting: Podiatry

## 2020-03-16 DIAGNOSIS — F331 Major depressive disorder, recurrent, moderate: Secondary | ICD-10-CM | POA: Diagnosis not present

## 2020-03-16 DIAGNOSIS — D692 Other nonthrombocytopenic purpura: Secondary | ICD-10-CM | POA: Diagnosis not present

## 2020-03-16 DIAGNOSIS — E7800 Pure hypercholesterolemia, unspecified: Secondary | ICD-10-CM | POA: Diagnosis not present

## 2020-03-16 DIAGNOSIS — Z853 Personal history of malignant neoplasm of breast: Secondary | ICD-10-CM | POA: Diagnosis not present

## 2020-03-16 DIAGNOSIS — C50919 Malignant neoplasm of unspecified site of unspecified female breast: Secondary | ICD-10-CM | POA: Diagnosis not present

## 2020-03-16 DIAGNOSIS — E559 Vitamin D deficiency, unspecified: Secondary | ICD-10-CM | POA: Diagnosis not present

## 2020-03-16 DIAGNOSIS — K222 Esophageal obstruction: Secondary | ICD-10-CM | POA: Diagnosis not present

## 2020-03-16 DIAGNOSIS — I10 Essential (primary) hypertension: Secondary | ICD-10-CM | POA: Diagnosis not present

## 2020-03-16 DIAGNOSIS — Z9010 Acquired absence of unspecified breast and nipple: Secondary | ICD-10-CM | POA: Diagnosis not present

## 2020-03-16 DIAGNOSIS — E118 Type 2 diabetes mellitus with unspecified complications: Secondary | ICD-10-CM | POA: Diagnosis not present

## 2020-03-16 DIAGNOSIS — E119 Type 2 diabetes mellitus without complications: Secondary | ICD-10-CM | POA: Diagnosis not present

## 2020-03-16 DIAGNOSIS — F411 Generalized anxiety disorder: Secondary | ICD-10-CM | POA: Diagnosis not present

## 2020-03-21 DIAGNOSIS — I10 Essential (primary) hypertension: Secondary | ICD-10-CM | POA: Diagnosis not present

## 2020-03-22 ENCOUNTER — Other Ambulatory Visit: Payer: Self-pay

## 2020-03-22 ENCOUNTER — Inpatient Hospital Stay: Payer: Medicare Other | Attending: Nurse Practitioner | Admitting: Nurse Practitioner

## 2020-03-22 ENCOUNTER — Encounter: Payer: Self-pay | Admitting: Nurse Practitioner

## 2020-03-22 VITALS — BP 186/70 | HR 70 | Temp 98.5°F | Resp 14 | Ht 68.0 in | Wt 151.0 lb

## 2020-03-22 DIAGNOSIS — Z79899 Other long term (current) drug therapy: Secondary | ICD-10-CM | POA: Insufficient documentation

## 2020-03-22 DIAGNOSIS — C50812 Malignant neoplasm of overlapping sites of left female breast: Secondary | ICD-10-CM | POA: Insufficient documentation

## 2020-03-22 DIAGNOSIS — Z170 Estrogen receptor positive status [ER+]: Secondary | ICD-10-CM | POA: Diagnosis not present

## 2020-03-22 DIAGNOSIS — Z9012 Acquired absence of left breast and nipple: Secondary | ICD-10-CM | POA: Insufficient documentation

## 2020-03-22 DIAGNOSIS — Z841 Family history of disorders of kidney and ureter: Secondary | ICD-10-CM | POA: Diagnosis not present

## 2020-03-22 DIAGNOSIS — Z85028 Personal history of other malignant neoplasm of stomach: Secondary | ICD-10-CM | POA: Insufficient documentation

## 2020-03-22 DIAGNOSIS — Z803 Family history of malignant neoplasm of breast: Secondary | ICD-10-CM | POA: Diagnosis not present

## 2020-03-22 DIAGNOSIS — Z8042 Family history of malignant neoplasm of prostate: Secondary | ICD-10-CM | POA: Insufficient documentation

## 2020-03-22 DIAGNOSIS — Z808 Family history of malignant neoplasm of other organs or systems: Secondary | ICD-10-CM | POA: Insufficient documentation

## 2020-03-22 DIAGNOSIS — R232 Flushing: Secondary | ICD-10-CM | POA: Insufficient documentation

## 2020-03-22 DIAGNOSIS — Z853 Personal history of malignant neoplasm of breast: Secondary | ICD-10-CM | POA: Insufficient documentation

## 2020-03-22 DIAGNOSIS — Z923 Personal history of irradiation: Secondary | ICD-10-CM | POA: Insufficient documentation

## 2020-03-22 DIAGNOSIS — Z9011 Acquired absence of right breast and nipple: Secondary | ICD-10-CM | POA: Diagnosis not present

## 2020-03-22 DIAGNOSIS — I10 Essential (primary) hypertension: Secondary | ICD-10-CM | POA: Diagnosis not present

## 2020-03-22 DIAGNOSIS — Z90721 Acquired absence of ovaries, unilateral: Secondary | ICD-10-CM | POA: Insufficient documentation

## 2020-03-22 DIAGNOSIS — Z833 Family history of diabetes mellitus: Secondary | ICD-10-CM | POA: Insufficient documentation

## 2020-03-22 DIAGNOSIS — Z882 Allergy status to sulfonamides status: Secondary | ICD-10-CM | POA: Insufficient documentation

## 2020-03-22 DIAGNOSIS — Z8249 Family history of ischemic heart disease and other diseases of the circulatory system: Secondary | ICD-10-CM | POA: Diagnosis not present

## 2020-03-22 DIAGNOSIS — Z823 Family history of stroke: Secondary | ICD-10-CM | POA: Insufficient documentation

## 2020-03-22 DIAGNOSIS — N6489 Other specified disorders of breast: Secondary | ICD-10-CM | POA: Insufficient documentation

## 2020-03-22 DIAGNOSIS — C50912 Malignant neoplasm of unspecified site of left female breast: Secondary | ICD-10-CM | POA: Diagnosis not present

## 2020-03-22 DIAGNOSIS — Z800 Family history of malignant neoplasm of digestive organs: Secondary | ICD-10-CM | POA: Diagnosis not present

## 2020-03-22 DIAGNOSIS — Z9049 Acquired absence of other specified parts of digestive tract: Secondary | ICD-10-CM | POA: Insufficient documentation

## 2020-03-22 DIAGNOSIS — Z809 Family history of malignant neoplasm, unspecified: Secondary | ICD-10-CM | POA: Diagnosis not present

## 2020-03-22 NOTE — Progress Notes (Signed)
CLINIC:  Survivorship   Patient Care Team: Tisovec, Cindy Him, MD as PCP - Cindy Wong, Cindy Blanch, RN as Oncology Nurse Navigator Cindy Germany, RN as Oncology Nurse Navigator Cindy Merle, MD as Consulting Physician (Hematology) Cindy Feeling, NP as Nurse Practitioner (Nurse Practitioner) Cindy Mesa, MD as Consulting Physician (General Surgery)   REASON FOR VISIT:  Routine follow-up post-treatment for a recent history of breast cancer.  BRIEF ONCOLOGIC HISTORY:  Oncology History Overview Note  Cancer Staging Malignant neoplasm of central left breast [facility]) Staging form: Breast, AJCC 8th Edition - Clinical stage from 11/19/2019: Stage IB (cT2, cN0, cM0, G2, ER+, PR+, HER2-) - Signed by Cindy Feeling, NP on 11/23/2019 - Pathologic stage from 12/08/2019: Stage Unknown (pT2, pNX, cM0, G2, ER+, PR+, HER2-) - Signed by Cindy Merle, MD on 12/25/2019    Malignant neoplasm of central left breast ([place])  11/09/2019 Breast US   IMPRESSION: 1. Highly suspicious left breast mass at the 12 o'clock position 4 cm from the nipple. Recommendation is for ultrasound-guided biopsy. 2. Indeterminate hyperechoic mass at the 12 o'clock position 4 cm from the nipple, just superior to the index lesion. This may correspond with an additional asymmetry seen mammographically. Recommendation is for ultrasound-guided biopsy with close attention on post clip films to ensure mammographic correlation. 3. No suspicious left axillary lymphadenopathy. 4. No suspicious findings at the site of the right mastectomy bed palpable lump.   11/19/2019 Cancer Staging   Staging form: Breast, AJCC 8th Edition - Clinical stage from 11/19/2019: Stage IB (cT2, cN0, cM0, G2, ER+, PR+, HER2-) - Signed by Cindy Feeling, NP on 11/23/2019   11/19/2019 Initial Biopsy   Diagnosis Breast, left, needle core biopsy, 12 o'clock - INVASIVE MAMMARY CARCINOMA - SEE COMMENT E-cadherin is POSITIVE supporting a ductal  origin. PROGNOSTIC INDICATORS Results: IMMUNOHISTOCHEMICAL AND MORPHOMETRIC ANALYSIS PERFORMED MANUALLY The tumor cells are NEGATIVE for Her2 (1+). Estrogen Receptor: 95%, POSITIVE, STRONG STAINING INTENSITY Progesterone Receptor: 95%, POSITIVE, STRONG STAINING INTENSITY Proliferation Marker Ki67: 10%   11/23/2019 Initial Diagnosis   Malignant neoplasm of left breast ([place])   11/29/2019 Breast MRI   IMPRESSION: 1. 3.1 cm biopsy-proven malignancy within the UPPER LEFT breast. 2. Indeterminate 0.9 cm posterior central/LOWER LEFT breast mass. If breast conservation is desired, 2nd-look ultrasound and possible biopsy is recommended. If this mass is not identified sonographically, than MR guided biopsy would be recommended. 3. No abnormal appearing lymph nodes. 4. RIGHT mastectomy without suspicious abnormalities in the RIGHT mastectomy bed.   12/08/2019 Cancer Staging   Staging form: Breast, AJCC 8th Edition - Pathologic stage from 12/08/2019: Stage Unknown (pT2, pNX, cM0, G2, ER+, PR+, HER2-) - Signed by Cindy Merle, MD on 12/25/2019   12/08/2019 Surgery   Left Mastectomy by Dr Cindy Wong   12/08/2019 Pathology Results   FINAL MICROSCOPIC DIAGNOSIS:   A. BREAST, LEFT, MASTECTOMY:  - Invasive ductal carcinoma, multifocal, 2.8 cm in greatest dimension,  Nottingham grade 2 of 3.  - Margins of resection are not involved.  - See oncology table.  - See oncology table.    03/2020 -  Anti-estrogen oral therapy   Anastrozole 37m once daily starting 01/2020   03/22/2020 Survivorship   SCP delivered by Cindy Rue NP      INTERVAL HISTORY:  [facility] to review her survivorship care plan detailing her treatment course for breast cancer, as well as monitoring long-term side effects of that treatment, education  regarding health maintenance, screening, and overall wellness and health promotion.     Cindy Wong is doing well. She  was concerned reading anastrozole side effects and has not started yet. Saw PCP last week. Had mild hot flashes after mastectomy that have calmed down. Energy and appetite are normal. Denies incision or chest wall concerns. Denies change in bowel habits, abdominal pain, or bloating. Thinks she might have fluid in right ear. Denies pain. Denies recent fever, chills, cough, chest pain, dyspnea, leg edema, bone/joint pain, or other issues.     ONCOLOGY TREATMENT TEAM:  1. Surgeon:  Dr. Georgette Wong at [facility] 2. Medical Oncologist: Dr. Burr Medico     PAST MEDICAL/SURGICAL HISTORY:  Past Medical History:  Diagnosis Date  . Adenomatous colon polyp    tubular  . Anxiety   . Arthritis   . Blood transfusion without reported diagnosis   . Breast cancer [facility]) 2007   right mastectomy  . Breast cancer ([place]) 10/2019   left breast IDC  . Cataract   . Depression   . Diabetes mellitus   . Hx of meningitis 2012  . Hyperlipemia   . Hypertension   . Personal history of radiation therapy   . Stomach cancer ([place]) 1997   partial gastrectomy   Past Surgical History:  Procedure Laterality Date  . ABDOMINAL HYSTERECTOMY  1974   Supracervical  . BREAST LUMPECTOMY Right 1993  . BREAST SURGERY     Mastectomy  . CHOLECYSTECTOMY    . COLONOSCOPY    . MASTECTOMY Right   . MASTECTOMY, PARTIAL Left 12/08/2019   Procedure: LEFT MASTECTOMY;  Surgeon: Cindy Mesa, MD;  Location: [facility];  Service: General;  Laterality: Left;  LMA AND PECTORAL BLOCK  . meningitis    . OOPHORECTOMY     One ovary removed--?side  . Stomach tumor    . UPPER GASTROINTESTINAL ENDOSCOPY       ALLERGIES:  Allergies  Allergen Reactions  . Sulfonamide Derivatives Hives and Itching  . Sulfamethoxazole Rash     CURRENT MEDICATIONS:  Outpatient Encounter Medications as of 03/22/2020  Medication Sig  . amLODipine (NORVASC) 5 MG tablet Take 5 mg by mouth daily. In the morning  . anastrozole  (ARIMIDEX) 1 MG tablet Take 1 tablet (1 mg total) by mouth daily.  . carvedilol (COREG) 6.25 MG tablet TAKE 1 TABLET(6.25 MG) BY MOUTH TWICE DAILY  . Cholecalciferol (D2000 ULTRA STRENGTH) 50 MCG (2000 UT) CAPS Take by mouth daily.  Marland Kitchen glipiZIDE (GLUCOTROL XL) 10 MG 24 hr tablet Take 10 mg by mouth daily.    . hydrochlorothiazide (HYDRODIURIL) 25 MG tablet Take 25 mg by mouth daily. In the morning  . HYDROcodone-acetaminophen (NORCO/VICODIN) 5-325 MG tablet Take 1 tablet by mouth every 6 (six) hours as needed for moderate pain.  . Linagliptin-Metformin HCl (JENTADUETO) 2.5-500 MG TABS Take 1 tablet by mouth 2 (two) times daily.   Marland Kitchen lisinopril (PRINIVIL,ZESTRIL) 40 MG tablet Take 40 mg by mouth daily. In the morning  . Multiple Vitamins-Minerals (CENTRUM SILVER PO) Take 1 tablet by mouth daily.   Glory Rosebush VERIO test strip 2 (two) times daily. for testing  . potassium chloride SA (K-DUR,KLOR-CON) 20 MEQ tablet Take 20 mEq by mouth daily.  . pravastatin (PRAVACHOL) 40 MG tablet Take 40 mg by mouth every evening.   Marland Kitchen ROYAL JELLY PO Take by mouth daily.   No facility-administered encounter medications on file as of 03/22/2020.     ONCOLOGIC FAMILY HISTORY:  Family History  Problem Relation Age of Onset  . Breast cancer Sister 39  . Diabetes Sister   . Hypertension Sister   . Colon cancer Sister   . Melanoma Sister   . Heart disease Brother   . Diabetes Brother   . Hypertension Brother   . Prostate cancer Brother   . Colon cancer Brother   . Heart failure Brother   . Hypertension Mother   . Stroke Father   . Heart failure Sister   . Kidney disease Sister   . Hypertension Sister   . Cancer Sister   . Heart disease Brother   . Hypertension Brother   . Esophageal cancer Neg Hx   . Rectal cancer Neg Hx   . Stomach cancer Neg Hx      GENETIC COUNSELING/TESTING: Referred but declined testing   SOCIAL HISTORY:  Cindy Wong lives in [place], [place].  She denies  any current or history of tobacco, alcohol, or illicit drug use.     PHYSICAL EXAMINATION:  Vital Signs:   Vitals:   03/22/20 1009  BP: (!) 186/70  Pulse: 70  Resp: 14  Temp: 98.5 F (36.9 C)  SpO2: 100%   Filed Weights   03/22/20 1009  Weight: 151 lb (68.5 kg)   General: Well-nourished, well-appearing female in no acute distress.   HEENT:  Sclerae anicteric. Right ear without erythema, bulging, or effusion.  Lymph: No cervical, supraclavicular, or infraclavicular lymphadenopathy noted on palpation.  Respiratory:  breathing non-labored.  GI: Abdomen soft and round; non-tender, non-distended. Bowel sounds normoactive.  Neuro: No focal deficits. Steady gait.  Psych: Mood and affect normal and appropriate for situation.  Extremities: No edema. MSK: No focal spinal tenderness to palpation.  Full range of motion in bilateral upper extremities Skin: Warm and dry. Breast exam: s/p bilateral mastectomy. Incisions completely healed. No nodularity or mass along the incisions, chest wall, or either axilla that I could appreciate   LABORATORY DATA:  None for this visit.  DIAGNOSTIC IMAGING:  None for this visit.      ASSESSMENT AND PLAN:  Ms.. Wong is a pleasant 87 y.o. female with Stage IB left breast invasive ductal carcinoma, ER+/PR+/HER2-, 10/2019 treated with mastectomy and anti-estrogen therapy with Anastrozole (pending DEXA).  She presents to the Survivorship Clinic for our initial meeting and routine follow-up post-completion of treatment for breast cancer.    1. Stage IB left breast cancer:  Cindy Wong has recovered well from definitive treatment for breast cancer. She will follow-up with her medical oncologist, Dr. Burr Medico in 06/2020 with history and physical exam per surveillance protocol.  She has not started AI yet due to concern for SE's. I reviewed the potential risk/benefit with her and she is willing to start. Will wait for DEXA now which is scheduled with PCP on 03/25/20.  If she has severe osteopenia or osteoporosis will start tamoxifen rather than anastrozole. She was instructed to make Dr. Burr Medico or myself aware if she begins to experience any side effects of the medication and I could see her back in clinic to help manage those side effects, as needed. Today, a comprehensive survivorship care plan and treatment summary was reviewed with the patient today detailing her breast cancer diagnosis, treatment course, potential late/long-term effects of treatment, appropriate follow-up care with recommendations for the future, and patient education resources.  A copy of this summary, along with a letter will be sent to the patient's primary care provider via In Basket message after  today's visit.    2. H/o GIST s/p surgical resection: no clinical signs of recurrence. Last EGD per Dr. Ardis Hughs 04/2018  3. Fluid in right ear: benign exam, f/up with PCP  4. Bone health:  Given Cindy Wong age/history of breast cancer and her current treatment regimen including anti-estrogen therapy with anastrozole, she is at risk for bone demineralization.  Next DEXA is scheduled on 03/25/20. If she has severe osteopenia or osteoporosis will recommend tamoxifen rather than AI.  In the meantime, she was encouraged to increase her consumption of foods rich in calcium, as well as increase her weight-bearing activities.  She was given education on specific activities to promote bone health.  5. Cancer screening:  Due to Cindy Wong's history and her age, she should receive screening for skin cancers, colon cancer, and gynecologic cancers.  The information and recommendations are listed on the patient's comprehensive care plan/treatment summary and were reviewed in detail with the patient.    6. Health maintenance and wellness promotion: Cindy Wong was encouraged to consume 5-7 servings of fruits and vegetables per day. We reviewed the "Nutrition Rainbow" handout, as well as the handout "Take Control of Your  Health and Reduce Your Cancer Risk" from the [place].  She was also encouraged to engage in moderate to vigorous exercise for 30 minutes per day most days of the week. We discussed the [employer] fitness program, which is designed for cancer survivors to help them become more physically fit after cancer treatments.  She was instructed to limit her alcohol consumption and continue to abstain from tobacco use.   7. Support services/counseling: It is not uncommon for this period of the patient's cancer care trajectory to be one of many emotions and stressors.  We discussed an opportunity for her to participate in the next session of [facility] ("Finding Your New Normal") support group series designed for patients after they have completed treatment.   Cindy Wong was encouraged to take advantage of our many other support services programs, support groups, and/or counseling in coping with her new life as a cancer survivor after completing anti-cancer treatment.  She was offered support today through active listening and expressive supportive counseling.  She was given information regarding our available services and encouraged to contact me with any questions or for help enrolling in any of our support group/programs.    Dispo:   -Return to cancer center 06/2020 -DEXA 03/25/20 at Dr. Loren Racer office, will obtain report and rec tamoxifen if it shows severe osteopenia or osteoporosis  -Follow up with surgery 04/12/20 -F/up PCP for right ear issue and other chronic conditions  -She is welcome to return back to the Survivorship Clinic at any time; no additional follow-up needed at this time.  -Consider referral back to survivorship as a long-term survivor for continued surveillance  A total of (30) minutes of face-to-face time was spent with this patient with greater than 50% of that time in counseling and care-coordination.   Cira Rue, NP Survivorship Program [facility] [phone]   Note: PRIMARY CARE PROVIDER [name], [place] [phone]

## 2020-03-23 ENCOUNTER — Telehealth: Payer: Self-pay | Admitting: Nurse Practitioner

## 2020-03-23 NOTE — Telephone Encounter (Signed)
No 2/1 los. No changes made to pt's schedule

## 2020-03-25 DIAGNOSIS — M8589 Other specified disorders of bone density and structure, multiple sites: Secondary | ICD-10-CM | POA: Diagnosis not present

## 2020-03-25 DIAGNOSIS — E559 Vitamin D deficiency, unspecified: Secondary | ICD-10-CM | POA: Diagnosis not present

## 2020-03-29 ENCOUNTER — Other Ambulatory Visit: Payer: Self-pay | Admitting: Cardiology

## 2020-03-29 DIAGNOSIS — I493 Ventricular premature depolarization: Secondary | ICD-10-CM

## 2020-04-12 DIAGNOSIS — C50912 Malignant neoplasm of unspecified site of left female breast: Secondary | ICD-10-CM | POA: Diagnosis not present

## 2020-04-13 ENCOUNTER — Telehealth: Payer: Self-pay

## 2020-04-13 NOTE — Telephone Encounter (Signed)
-----   Message from Alla Feeling, NP sent at 04/12/2020  3:04 PM EST ----- Thank you for the follow up.   Steph, can you please call Dr. Loren Racer office for DEXA and leave it on my desk. I will call her about anti-estrogen.  Thanks, Regan Rakers  ----- Message ----- From: Donnie Mesa, MD Sent: 04/12/2020   2:57 PM EST To: Alla Feeling, NP  She was still wondering whether she was supposed to start the anastrozole or not.  Her DEXA was done in early February.  I will see her annually

## 2020-04-13 NOTE — Telephone Encounter (Signed)
Called left message on secure per office request for DEXA results completed early feb to be faxed to [facility] c/o Cira Rue   Currently awaiting call or fax

## 2020-04-21 DIAGNOSIS — I10 Essential (primary) hypertension: Secondary | ICD-10-CM | POA: Diagnosis not present

## 2020-04-26 NOTE — Progress Notes (Signed)
Follow up visit  Subjective:   Cindy Wong, female    DOB: 05-27-1931, 87 y.o.   MRN: [id number]   Chief Complaint  Patient presents with  . Hypertension  . Follow-up     HPI  87 year old African-American female with controlled hypertension, type 2 diabetes mellitus, hyperlipidemia, seen for frequent PVCs/ventricular bigeminy, abnormal stress test  Patient is doing very well. She denies chest pain, shortness of breath, palpitations, leg edema, orthopnea, PND, TIA/syncope. She has only occasional lightheadedness episodes. Home avg BP 130/60 mmhg   Current Outpatient Medications on File Prior to Visit  Medication Sig Dispense Refill  . amLODipine (NORVASC) 5 MG tablet Take 5 mg by mouth daily. In the morning    . carvedilol (COREG) 6.25 MG tablet TAKE 1 TABLET(6.25 MG) BY MOUTH TWICE DAILY 180 tablet 0  . Cholecalciferol (D2000 ULTRA STRENGTH) 50 MCG (2000 UT) CAPS Take by mouth daily.    Marland Kitchen glipiZIDE (GLUCOTROL XL) 10 MG 24 hr tablet Take 10 mg by mouth daily.    . hydrochlorothiazide (HYDRODIURIL) 25 MG tablet Take 25 mg by mouth daily. In the morning  10  . linaGLIPtin-metFORMIN HCl 2.5-500 MG TABS Take 1 tablet by mouth 2 (two) times daily.     Marland Kitchen lisinopril (PRINIVIL,ZESTRIL) 40 MG tablet Take 40 mg by mouth daily. In the morning  0  . Multiple Vitamins-Minerals (CENTRUM SILVER PO) Take 1 tablet by mouth daily.     Glory Rosebush VERIO test strip 2 (two) times daily. for testing  2  . potassium chloride SA (K-DUR,KLOR-CON) 20 MEQ tablet Take 20 mEq by mouth daily.    . pravastatin (PRAVACHOL) 40 MG tablet Take 40 mg by mouth every evening.    Marland Kitchen ROYAL JELLY PO Take by mouth daily.     No current facility-administered medications on file prior to visit.    Cardiovascular studies:  EKG 04/27/2020: Sinus rhythm 74 bpm Left atrial enlargement.  Nonspecific T wave abnormality Compared to previous EKG's, PVCs now absent  Echocardiogram 10/16/2018: Left ventricle cavity is  normal in size. Normal left ventricular wall thickness. Normal LV systolic function with EF 55%. Normal global wall motion. Diastolic function assessment limited due to frequent PVC's. Calculated EF 55%. Trileaflet aortic valve with mild thickening. Trace aortic stenosis. No regurgitation noted. Mild tricuspid regurgitation. Trace pulmonic regurgitation.  No evidence of pulmonary hypertension.  Lexiscan Myoview stress test 09/29/2018: Lexiscan stress test was performed. Stress EKG is non-diagnostic, as this is pharmacological stress test. In addition, rest and stress EKG demonstrate sinus rhythm, frequent PVC's, inferolateral nonspecific ST-T changes.  LVEF 63%. SPECT stress and rest images demonstrate very small size, mild intensity, fixed perfusion defect in basal inferoseptal myocardium. While the defect is small, TID of 1.31 with regional perfusion defect is considered high risk finding. Clinical correlation recommended.   EKG 09/15/2018: Sinus rhythm 77 bpm. Frequent PVC  Carotid US 08/14/2018: Right Carotid: Velocities in the right ICA are consistent with a 1-39% stenosis.                Non-hemodynamically significant plaque <50% noted in the CCA. The                ECA appears <50% stenosed.  Left Carotid: Velocities in the left ICA are consistent with a 1-39% stenosis.               Non-hemodynamically significant plaque noted in the CCA.  Heterogenous multinodular thyroid gland incidentally noted.   Recent labs: 12/04/2019: Glucose 114, BUN/Cr 23/1.02. EGFR 50. Na/K 140/4.2. Rest of the CMP normal H/H 14/47. MCV 83. Platelets 277 HbA1C N/A Lipid panel N/A TSH N/A  11/21/2018: Glucose 101, BUN/Cr 18/0.89. EGFR >60. Na/K 142/4.1. Rest of the CMP normal H/H 15/48. MCV 85. Platelets 268   Review of Systems  Constitutional: Positive for malaise/fatigue.  Cardiovascular: Negative for chest pain, dyspnea on exertion, leg swelling, palpitations and syncope.          Vitals:   04/27/20 1134  BP: (!) 147/81  Pulse: 79  Resp: 16  SpO2: 97%     Body mass index is 22.5 kg/m. Filed Weights   04/27/20 1134  Weight: 148 lb (67.1 kg)     Objective:   Physical Exam Vitals and nursing note reviewed.  Constitutional:      Appearance: She is well-developed.  Neck:     Vascular: No JVD.  Cardiovascular:     Rate and Rhythm: Normal rate and regular rhythm.     Pulses: Normal pulses and intact distal pulses.     Heart sounds: Normal heart sounds. No murmur heard.   Pulmonary:     Effort: Pulmonary effort is normal.     Breath sounds: Normal breath sounds. No wheezing or rales.           Assessment & Recommendations:   87 year old African-American female with controlled hypertension, type 2 diabetes mellitus, hyperlipidemia, seen for frequent PVCs/ventricular bigeminy, abnormal stress test  Hypertension: Fairly well controlled on carvedilol 6.25 mg bid.  Structurally normal heart. Stress test shows TID. However, she does not have any angina symptoms at this time.  Given advanced age and absence of angina, do not recommend Aspirin at this time. Okay to continue pravastatin.   Ventricular bigeminy: Resolved  Leg pain: Mostly in feet and ankle. Will check screening ABI  F/u in 1 year  Nigel Mormon, MD [facility] [place] Pager: [phone] Office: [phone] If no answer Cell [phone]

## 2020-04-27 ENCOUNTER — Other Ambulatory Visit: Payer: Self-pay

## 2020-04-27 ENCOUNTER — Encounter: Payer: Self-pay | Admitting: Cardiology

## 2020-04-27 ENCOUNTER — Ambulatory Visit: Payer: Medicare Other | Admitting: Cardiology

## 2020-04-27 VITALS — BP 147/81 | HR 79 | Resp 16 | Ht 68.0 in | Wt 148.0 lb

## 2020-04-27 DIAGNOSIS — M79605 Pain in left leg: Secondary | ICD-10-CM | POA: Diagnosis not present

## 2020-04-27 DIAGNOSIS — M79604 Pain in right leg: Secondary | ICD-10-CM

## 2020-04-27 DIAGNOSIS — I10 Essential (primary) hypertension: Secondary | ICD-10-CM

## 2020-04-27 DIAGNOSIS — I493 Ventricular premature depolarization: Secondary | ICD-10-CM | POA: Diagnosis not present

## 2020-05-09 ENCOUNTER — Encounter: Payer: Self-pay | Admitting: Pharmacist

## 2020-05-09 NOTE — Progress Notes (Signed)
CARE PLAN ENTRY  05/09/2020 Name: Cindy Wong MRN: [id number] DOB: [birth year]  Cindy Wong is enrolled in Remote Patient Monitoring/Principle Care Monitoring.  Date of Enrollment: 06/04/19 Supervising physician: Vernell Leep Indication: HTN  Remote Readings: Compliant and Avg BP: 129/72, HR:73  Next scheduled OV: 04/27/21  Pharmacist Clinical Goal(s):  Marland Kitchen Over the next 90 days, patient will demonstrate Improved medication adherence as evidenced by medication fill history . Over the next 90 days, patient will demonstrate improved understanding of prescribed medications and rationale for usage as evidenced by patient teach back . Over the next 90 days, patient will experience decrease in ED visits. ED visits in last 6 months = 0 . Over the next 90 days, patient will not experience hospital admission. Hospital Admissions in last 6 months = 0  Interventions: . Provider and Inter-disciplinary care team collaboration (see longitudinal plan of care) . Comprehensive medication review performed. . Discussed plans with patient for ongoing care management follow up and provided patient with direct contact information for care management team . Collaboration with provider re: medication management  Patient Self Care Activities:  . Self administers medications as prescribed . Attends all scheduled provider appointments . Performs ADL's independently . Performs IADL's independently  Patient Active Problem List   Diagnosis Date Noted  . Pain in both lower extremities 04/27/2020  . Invasive ductal carcinoma of breast, female, left ([place]) 12/08/2019  . Malignant neoplasm of central left breast ([place]) 11/23/2019  . Abnormal stress test 10/16/2018  . PVC's (premature ventricular contractions) 09/15/2018  . History of ductal carcinoma in situ (DCIS) of breast 10/27/2015  . Personal history of malignant gastrointestinal stromal tumor (GIST) 10/27/2015  . DCIS (ductal carcinoma in  situ) 10/23/2011  . Fibroid   . Diabetes mellitus   . Cancer ([place])   . Essential hypertension   . Meningitis due to bacterium 02/05/2011  . Chronic mastoiditis of right side 02/05/2011  . BLOOD IN STOOL 07/25/2009   Past Surgical History:  Procedure Laterality Date  . ABDOMINAL HYSTERECTOMY  1974   Supracervical  . BREAST LUMPECTOMY Right 1993  . BREAST SURGERY     Mastectomy  . CHOLECYSTECTOMY    . COLONOSCOPY    . MASTECTOMY Right   . MASTECTOMY, PARTIAL Left 12/08/2019   Procedure: LEFT MASTECTOMY;  Surgeon: Donnie Mesa, MD;  Location: [place] [facility];  Service: General;  Laterality: Left;  LMA AND PECTORAL BLOCK  . meningitis    . OOPHORECTOMY     One ovary removed--?side  . Stomach tumor    . UPPER GASTROINTESTINAL ENDOSCOPY     Social History   Socioeconomic History  . Marital status: Widowed    Spouse name: Not on file  . Number of children: 0  . Years of education: Not on file  . Highest education level: Not on file  Occupational History  . Occupation: retired    Fish farm manager: RETIRED  Tobacco Use  . Smoking status: Never Smoker  . Smokeless tobacco: Never Used  Vaping Use  . Vaping Use: Never used  Substance and Sexual Activity  . Alcohol use: No    Alcohol/week: 0.0 standard drinks  . Drug use: No  . Sexual activity: Not Currently    Birth control/protection: Surgical    Comment: HYST-1st intercourse 59 yo-5 partners  Other Topics Concern  . Not on file  Social History Narrative  . Not on file   Social Determinants of Health   Financial Resource Strain: Not on file  Food Insecurity: Not on file  Transportation Needs: Not on file  Physical Activity: Not on file  Stress: Not on file  Social Connections: Not on file   Family History  Problem Relation Age of Onset  . Breast cancer Sister 71  . Diabetes Sister   . Hypertension Sister   . Colon cancer Sister   . Melanoma Sister   . Heart disease Brother   . Diabetes Brother   .  Hypertension Brother   . Prostate cancer Brother   . Colon cancer Brother   . Heart failure Brother   . Hypertension Mother   . Stroke Father   . Heart failure Sister   . Kidney disease Sister   . Hypertension Sister   . Cancer Sister   . Heart disease Brother   . Hypertension Brother   . Esophageal cancer Neg Hx   . Rectal cancer Neg Hx   . Stomach cancer Neg Hx    Allergies  Allergen Reactions  . Sulfonamide Derivatives Hives and Itching  . Sulfamethoxazole Rash   Outpatient Encounter Medications as of 05/09/2020  Medication Sig  . amLODipine (NORVASC) 5 MG tablet Take 5 mg by mouth daily. In the morning  . carvedilol (COREG) 6.25 MG tablet TAKE 1 TABLET(6.25 MG) BY MOUTH TWICE DAILY  . Cholecalciferol (D2000 ULTRA STRENGTH) 50 MCG (2000 UT) CAPS Take by mouth daily.  Marland Kitchen glipiZIDE (GLUCOTROL XL) 10 MG 24 hr tablet Take 10 mg by mouth daily.  . hydrochlorothiazide (HYDRODIURIL) 25 MG tablet Take 25 mg by mouth daily. In the morning  . linaGLIPtin-metFORMIN HCl 2.5-500 MG TABS Take 1 tablet by mouth 2 (two) times daily.   Marland Kitchen lisinopril (PRINIVIL,ZESTRIL) 40 MG tablet Take 40 mg by mouth daily. In the morning  . Multiple Vitamins-Minerals (CENTRUM SILVER PO) Take 1 tablet by mouth daily.   Glory Rosebush VERIO test strip 2 (two) times daily. for testing  . potassium chloride SA (K-DUR,KLOR-CON) 20 MEQ tablet Take 20 mEq by mouth daily.  . pravastatin (PRAVACHOL) 40 MG tablet Take 40 mg by mouth every evening.  Marland Kitchen ROYAL JELLY PO Take by mouth daily.   No facility-administered encounter medications on file as of 05/09/2020.   Patient Care Team    Relationship Specialty Notifications Start End  Tisovec, Fransico Him, MD PCP - General   01/24/10    Comment: Allene Dillon, RN Oncology Nurse Navigator   11/24/19   Rockwell Germany, RN Oncology Nurse Navigator   11/24/19   Truitt Merle, MD Consulting Physician Hematology  11/24/19   Alla Feeling, NP Nurse Practitioner Nurse  Practitioner  03/08/20   Donnie Mesa, MD Consulting Physician General Surgery  03/08/20       Hypertension   BP goal is:  <130/80  Office blood pressures are  BP Readings from Last 3 Encounters:  04/27/20 (!) 147/81  03/22/20 (!) 186/70  01/12/20 (!) 142/71    Patient is currently controlled on the following medications: amlodipine 5 mg, carvedilol 6.25 mg BID, HCTZ 25 mg, lisinopril 40 mg,   Patient checks BP at home daily  Patient home BP readings are ranging: 120-146/67-81  Patient has failed these meds in the past: Olmesartan, triamterene-HCT   We discussed diet and exercise extensively  Plan  Continue current medications and control with diet and exercise  ______________ Visit Information SDOH (Social Determinants of Health) assessments performed: Yes.  Ms. Aydelotte was given information about Principle Care Management/Remote Patient Monitoring services today including:  1. RPM/PCM service includes personalized support from designated clinical staff supervised by her physician, including individualized plan of care and coordination with other care providers 2. 24/7 contact phone numbers for assistance for urgent and routine care needs. 3. Standard insurance, coinsurance, copays and deductibles apply for principle care management only during months in which we provide at least 30 minutes of these services. Most insurances cover these services at 100%, however patients may be responsible for any copay, coinsurance and/or deductible if applicable. This service may help you avoid the need for more expensive face-to-face services. 4. Only one practitioner may furnish and bill the service in a calendar month. 5. The patient may stop PCM/RPM services at any time (effective at the end of the month) by phone call to the office staff.  Patient agreed to services and verbal consent obtained.   Manuela Schwartz, Pharm.D. [facility] [phone]

## 2020-05-13 ENCOUNTER — Other Ambulatory Visit: Payer: Self-pay

## 2020-05-13 ENCOUNTER — Ambulatory Visit: Payer: Medicare Other

## 2020-05-13 DIAGNOSIS — M79604 Pain in right leg: Secondary | ICD-10-CM

## 2020-05-13 DIAGNOSIS — M79605 Pain in left leg: Secondary | ICD-10-CM

## 2020-05-21 DIAGNOSIS — I10 Essential (primary) hypertension: Secondary | ICD-10-CM | POA: Diagnosis not present

## 2020-05-24 ENCOUNTER — Encounter: Payer: Self-pay | Admitting: Podiatry

## 2020-05-24 ENCOUNTER — Ambulatory Visit (INDEPENDENT_AMBULATORY_CARE_PROVIDER_SITE_OTHER): Payer: Medicare Other | Admitting: Podiatry

## 2020-05-24 ENCOUNTER — Other Ambulatory Visit: Payer: Self-pay

## 2020-05-24 DIAGNOSIS — B351 Tinea unguium: Secondary | ICD-10-CM

## 2020-05-24 DIAGNOSIS — E1151 Type 2 diabetes mellitus with diabetic peripheral angiopathy without gangrene: Secondary | ICD-10-CM

## 2020-05-24 DIAGNOSIS — M79676 Pain in unspecified toe(s): Secondary | ICD-10-CM

## 2020-05-24 NOTE — Progress Notes (Signed)
This patient returns to my office for at risk foot care.  This patient requires this care by a professional since this patient will be at risk due to having diabetes.   This patient is unable to cut nails herself since the patient cannot reach her nails.  These nails is painful walking and wearing her shoes. . This patient presents for at risk foot care today.  General Appearance  Alert, conversant and in no acute stress.  Vascular  Dorsalis pedis  pulses are palpable  Bilaterally. Posterior tibial pulses are absent  B/L.  Capillary return is within normal limits  bilaterally. Temperature is within normal limits  Bilaterally  .Absent digital hair.  Neurologic  Senn-Weinstein monofilament wire test within normal limits  bilaterally. Muscle power within normal limits bilaterally.  Nails Thick disfigured discolored nails with subungual debris  from hallux to fifth toes bilaterally. No evidence of bacterial infection or drainage bilaterally.  Orthopedic  No limitations of motion  feet .  No crepitus or effusions noted.  No bony pathology or digital deformities noted.  midfoot arthritis  B/L.  Skin  normotropic skin with no porokeratosis noted bilaterally.  No signs of infections or ulcers noted.   Porokeratosis sub 5th met right foot. Asymptomatic. Asymptomatic  HD 5th toe left.  Onychomycosis  Pain in right toes  Pain in left toes  .  Consent was obtained for treatment procedures.   Mechanical debridement of nails 1-5  bilaterally performed with a nail nipper.  Filed with dremel without incident.    Return office visit    3 months                  Told patient to return for periodic foot care and evaluation due to potential at risk complications.   Gardiner Barefoot DPM

## 2020-05-25 NOTE — Progress Notes (Signed)
Called and spoke with patient regarding her ABI results.

## 2020-06-20 DIAGNOSIS — I10 Essential (primary) hypertension: Secondary | ICD-10-CM | POA: Diagnosis not present

## 2020-07-07 ENCOUNTER — Other Ambulatory Visit: Payer: Self-pay

## 2020-07-07 ENCOUNTER — Inpatient Hospital Stay: Payer: Medicare Other

## 2020-07-07 ENCOUNTER — Inpatient Hospital Stay: Payer: Medicare Other | Attending: Nurse Practitioner | Admitting: Hematology

## 2020-07-07 ENCOUNTER — Encounter: Payer: Self-pay | Admitting: Hematology

## 2020-07-07 VITALS — BP 166/64 | HR 74 | Temp 97.8°F | Resp 16 | Ht 68.0 in | Wt 149.6 lb

## 2020-07-07 DIAGNOSIS — Z90721 Acquired absence of ovaries, unilateral: Secondary | ICD-10-CM | POA: Insufficient documentation

## 2020-07-07 DIAGNOSIS — Z79899 Other long term (current) drug therapy: Secondary | ICD-10-CM | POA: Insufficient documentation

## 2020-07-07 DIAGNOSIS — Z170 Estrogen receptor positive status [ER+]: Secondary | ICD-10-CM | POA: Insufficient documentation

## 2020-07-07 DIAGNOSIS — Z86000 Personal history of in-situ neoplasm of breast: Secondary | ICD-10-CM

## 2020-07-07 DIAGNOSIS — Z9013 Acquired absence of bilateral breasts and nipples: Secondary | ICD-10-CM | POA: Insufficient documentation

## 2020-07-07 DIAGNOSIS — Z9049 Acquired absence of other specified parts of digestive tract: Secondary | ICD-10-CM | POA: Diagnosis not present

## 2020-07-07 DIAGNOSIS — Z923 Personal history of irradiation: Secondary | ICD-10-CM | POA: Insufficient documentation

## 2020-07-07 DIAGNOSIS — Z882 Allergy status to sulfonamides status: Secondary | ICD-10-CM | POA: Insufficient documentation

## 2020-07-07 DIAGNOSIS — C50912 Malignant neoplasm of unspecified site of left female breast: Secondary | ICD-10-CM

## 2020-07-07 DIAGNOSIS — N6489 Other specified disorders of breast: Secondary | ICD-10-CM | POA: Insufficient documentation

## 2020-07-07 DIAGNOSIS — Z903 Acquired absence of stomach [part of]: Secondary | ICD-10-CM | POA: Diagnosis not present

## 2020-07-07 DIAGNOSIS — C49A2 Gastrointestinal stromal tumor of stomach: Secondary | ICD-10-CM

## 2020-07-07 DIAGNOSIS — Z8042 Family history of malignant neoplasm of prostate: Secondary | ICD-10-CM | POA: Diagnosis not present

## 2020-07-07 DIAGNOSIS — C50812 Malignant neoplasm of overlapping sites of left female breast: Secondary | ICD-10-CM | POA: Diagnosis not present

## 2020-07-07 DIAGNOSIS — I10 Essential (primary) hypertension: Secondary | ICD-10-CM | POA: Insufficient documentation

## 2020-07-07 LAB — CBC WITH DIFFERENTIAL/PLATELET
Abs Immature Granulocytes: 0.03 10*3/uL (ref 0.00–0.07)
Basophils Absolute: 0.1 10*3/uL (ref 0.0–0.1)
Basophils Relative: 1 %
Eosinophils Absolute: 0.1 10*3/uL (ref 0.0–0.5)
Eosinophils Relative: 1 %
HCT: 43.4 % (ref 36.0–46.0)
Hemoglobin: 13.7 g/dL (ref 12.0–15.0)
Immature Granulocytes: 0 %
Lymphocytes Relative: 26 %
Lymphs Abs: 2.1 10*3/uL (ref 0.7–4.0)
MCH: 26.8 pg (ref 26.0–34.0)
MCHC: 31.6 g/dL (ref 30.0–36.0)
MCV: 84.8 fL (ref 80.0–100.0)
Monocytes Absolute: 0.7 10*3/uL (ref 0.1–1.0)
Monocytes Relative: 8 %
Neutro Abs: 5.3 10*3/uL (ref 1.7–7.7)
Neutrophils Relative %: 64 %
Platelets: 267 10*3/uL (ref 150–400)
RBC: 5.12 MIL/uL — ABNORMAL HIGH (ref 3.87–5.11)
RDW: 13 % (ref 11.5–15.5)
WBC: 8.3 10*3/uL (ref 4.0–10.5)
nRBC: 0 % (ref 0.0–0.2)

## 2020-07-07 LAB — COMPREHENSIVE METABOLIC PANEL
ALT: 10 U/L (ref 0–44)
AST: 21 U/L (ref 15–41)
Albumin: 3.9 g/dL (ref 3.5–5.0)
Alkaline Phosphatase: 50 U/L (ref 38–126)
Anion gap: 12 (ref 5–15)
BUN: 14 mg/dL (ref 8–23)
CO2: 27 mmol/L (ref 22–32)
Calcium: 9.8 mg/dL (ref 8.9–10.3)
Chloride: 103 mmol/L (ref 98–111)
Creatinine, Ser: 0.85 mg/dL (ref 0.44–1.00)
GFR, Estimated: >60 mL/min (ref >60)
Glucose, Bld: 106 mg/dL — ABNORMAL HIGH (ref 70–99)
Potassium: 3.8 mmol/L (ref 3.5–5.1)
Sodium: 142 mmol/L (ref 135–145)
Total Bilirubin: 0.5 mg/dL (ref 0.3–1.2)
Total Protein: 7 g/dL (ref 6.5–8.1)

## 2020-07-07 NOTE — Progress Notes (Signed)
I left vm with Dr Loren Racer HIM requesting most recent Dexa scan be faxed to our office.  I provided my fax and direct phone numbers.

## 2020-07-07 NOTE — Progress Notes (Signed)
[place]   [phone] [phone]   Clinic Follow up Note   Patient Care Team: Tisovec, Fransico Him, MD as PCP - Philomena Doheny, Paulette Blanch, RN as Oncology Nurse Navigator Rockwell Germany, RN as Oncology Nurse Navigator Truitt Merle, MD as Consulting Physician (Hematology) Alla Feeling, NP as Nurse Practitioner (Nurse Practitioner) Donnie Mesa, MD as Consulting Physician (General Surgery)  Date of Service:  07/07/2020  CHIEF COMPLAINT: f/u of left breast cancer and H/o DCIS right breast (1993, 2007s/p mastectomy) and h/o GIST (1997s/p partial gastrectomy)  SUMMARY OF ONCOLOGIC HISTORY: Oncology History Overview Note  Cancer Staging Malignant neoplasm of central left breast [facility]) Staging form: Breast, AJCC 8th Edition - Clinical stage from 11/19/2019: Stage IB (cT2, cN0, cM0, G2, ER+, PR+, HER2-) - Signed by Alla Feeling, NP on 11/23/2019 - Pathologic stage from 12/08/2019: Stage Unknown (pT2, pNX, cM0, G2, ER+, PR+, HER2-) - Signed by Truitt Merle, MD on 12/25/2019    Malignant neoplasm of central left breast ([place])  11/09/2019 Breast US   IMPRESSION: 1. Highly suspicious left breast mass at the 12 o'clock position 4 cm from the nipple. Recommendation is for ultrasound-guided biopsy. 2. Indeterminate hyperechoic mass at the 12 o'clock position 4 cm from the nipple, just superior to the index lesion. This may correspond with an additional asymmetry seen mammographically. Recommendation is for ultrasound-guided biopsy with close attention on post clip films to ensure mammographic correlation. 3. No suspicious left axillary lymphadenopathy. 4. No suspicious findings at the site of the right mastectomy bed palpable lump.   11/19/2019 Cancer Staging   Staging form: Breast, AJCC 8th Edition - Clinical stage from 11/19/2019: Stage IB (cT2, cN0, cM0, G2, ER+, PR+, HER2-) - Signed by Alla Feeling, NP on 11/23/2019   11/19/2019 Initial Biopsy    Diagnosis Breast, left, needle core biopsy, 12 o'clock - INVASIVE MAMMARY CARCINOMA - SEE COMMENT E-cadherin is POSITIVE supporting a ductal origin. PROGNOSTIC INDICATORS Results: IMMUNOHISTOCHEMICAL AND MORPHOMETRIC ANALYSIS PERFORMED MANUALLY The tumor cells are NEGATIVE for Her2 (1+). Estrogen Receptor: 95%, POSITIVE, STRONG STAINING INTENSITY Progesterone Receptor: 95%, POSITIVE, STRONG STAINING INTENSITY Proliferation Marker Ki67: 10%   11/23/2019 Initial Diagnosis   Malignant neoplasm of left breast ([place])   11/29/2019 Breast MRI   IMPRESSION: 1. 3.1 cm biopsy-proven malignancy within the UPPER LEFT breast. 2. Indeterminate 0.9 cm posterior central/LOWER LEFT breast mass. If breast conservation is desired, 2nd-look ultrasound and possible biopsy is recommended. If this mass is not identified sonographically, than MR guided biopsy would be recommended. 3. No abnormal appearing lymph nodes. 4. RIGHT mastectomy without suspicious abnormalities in the RIGHT mastectomy bed.   12/08/2019 Cancer Staging   Staging form: Breast, AJCC 8th Edition - Pathologic stage from 12/08/2019: Stage Unknown (pT2, pNX, cM0, G2, ER+, PR+, HER2-) - Signed by Truitt Merle, MD on 12/25/2019   12/08/2019 Surgery   Left Mastectomy by Dr Georgette Dover   12/08/2019 Pathology Results   FINAL MICROSCOPIC DIAGNOSIS:   A. BREAST, LEFT, MASTECTOMY:  - Invasive ductal carcinoma, multifocal, 2.8 cm in greatest dimension,  Nottingham grade 2 of 3.  - Margins of resection are not involved.  - See oncology table.  - See oncology table.    03/2020 -  Anti-estrogen oral therapy   Anastrozole 49m once daily starting 01/2020   03/22/2020 Survivorship   SCP delivered by LCira Rue NP       CURRENT THERAPY:  Anastrozole 156monce daily prescribed 01/2020, not started  INTERVAL HISTORY:  Cindy Wong is here for a follow up of left breast cancer. She was last seen by me on 01/12/20. She was also seen in the  survivorship clinic on 03/22/20. She presents to the clinic alone. She notes she feels better today than she's felt in months. She notes her younger brother has metastatic prostate cancer. She has been worrying about him, causing her to feel down the last few months.  She notes she never started the anastrozole due to concerns regarding bone health. At survivorship visit in 03/2020, she reported upcoming bone density screening with Dr. Osborne Casco on 03/25/20. Cindy Wong was to call the patient back with results and recommendation regarding anastrozole. The results never got sent to [place] (request sent 04/13/20) and she was not contacted regarding antiestrogens.  All other systems were reviewed with the patient and are negative.  MEDICAL HISTORY:  Past Medical History:  Diagnosis Date  . Adenomatous colon polyp    tubular  . Anxiety   . Arthritis   . Blood transfusion without reported diagnosis   . Breast cancer [facility]) 2007   right mastectomy  . Breast cancer ([place]) 10/2019   left breast IDC  . Cataract   . Depression   . Diabetes mellitus   . Hx of meningitis 2012  . Hyperlipemia   . Hypertension   . Personal history of radiation therapy   . Stomach cancer ([place]) 1997   partial gastrectomy    SURGICAL HISTORY: Past Surgical History:  Procedure Laterality Date  . ABDOMINAL HYSTERECTOMY  1974   Supracervical  . BREAST LUMPECTOMY Right 1993  . BREAST SURGERY     Mastectomy  . CHOLECYSTECTOMY    . COLONOSCOPY    . MASTECTOMY Right   . MASTECTOMY, PARTIAL Left 12/08/2019   Procedure: LEFT MASTECTOMY;  Surgeon: Donnie Mesa, MD;  Location: [place];  Service: General;  Laterality: Left;  LMA AND PECTORAL BLOCK  . meningitis    . OOPHORECTOMY     One ovary removed--?side  . Stomach tumor    . UPPER GASTROINTESTINAL ENDOSCOPY      I have reviewed the social history and family history with the patient and they are unchanged from previous note.  ALLERGIES:  is allergic to  sulfonamide derivatives and sulfamethoxazole.  MEDICATIONS:  Current Outpatient Medications  Medication Sig Dispense Refill  . amLODipine (NORVASC) 5 MG tablet Take 5 mg by mouth daily. In the morning    . carvedilol (COREG) 6.25 MG tablet TAKE 1 TABLET(6.25 MG) BY MOUTH TWICE DAILY 180 tablet 0  . Cholecalciferol (D2000 ULTRA STRENGTH) 50 MCG (2000 UT) CAPS Take by mouth daily.    Marland Kitchen glipiZIDE (GLUCOTROL XL) 10 MG 24 hr tablet Take 10 mg by mouth daily.    . hydrochlorothiazide (HYDRODIURIL) 25 MG tablet Take 25 mg by mouth daily. In the morning  10  . linaGLIPtin-metFORMIN HCl 2.5-500 MG TABS Take 1 tablet by mouth 2 (two) times daily.     Marland Kitchen lisinopril (PRINIVIL,ZESTRIL) 40 MG tablet Take 40 mg by mouth daily. In the morning  0  . Multiple Vitamins-Minerals (CENTRUM SILVER PO) Take 1 tablet by mouth daily.     Glory Rosebush VERIO test strip 2 (two) times daily. for testing  2  . potassium chloride SA (K-DUR,KLOR-CON) 20 MEQ tablet Take 20 mEq by mouth daily.    . pravastatin (PRAVACHOL) 40 MG tablet Take 40 mg by mouth every evening.    Marland Kitchen ROYAL JELLY PO Take by mouth daily.  No current facility-administered medications for this visit.    PHYSICAL EXAMINATION: ECOG PERFORMANCE STATUS: 1 - Symptomatic but completely ambulatory  Vitals:   07/07/20 0930  BP: (!) 166/64  Pulse: 74  Resp: 16  Temp: 97.8 F (36.6 C)  SpO2: 100%   Filed Weights   07/07/20 0930  Weight: 149 lb 9.6 oz (67.9 kg)    GENERAL:alert, no distress and comfortable SKIN: skin color, texture, turgor are normal, no rashes or significant lesions EYES: normal, Conjunctiva are pink and non-injected, sclera clear  NECK: supple, thyroid normal size, non-tender, without nodularity LYMPH:  no palpable lymphadenopathy in the cervical, axillary LUNGS: clear to auscultation and percussion with normal breathing effort HEART: regular rate & rhythm and no murmurs and no lower extremity edema ABDOMEN:abdomen soft,  non-tender and normal bowel sounds Musculoskeletal:no cyanosis of digits and no clubbing  NEURO: alert & oriented x 3 with fluent speech, no focal motor/sensory deficits  LABORATORY DATA:  I have reviewed the data as listed CBC Latest Ref Rng & Units 07/07/2020 11/23/2019 11/21/2018  WBC 4.0 - 10.5 K/uL 8.3 7.9 7.3  Hemoglobin 12.0 - 15.0 g/dL 13.7 14.8 15.3(H)  Hematocrit 36.0 - 46.0 % 43.4 47.1(H) 48.9(H)  Platelets 150 - 400 K/uL 267 277 268     CMP Latest Ref Rng & Units 07/07/2020 12/04/2019 11/23/2019  Glucose 70 - 99 mg/dL 106(H) 114(H) 134(H)  BUN 8 - 23 mg/dL _0 Creatinine 0.44 - 1.00 mg/dL 0.85 1.02(H) 0.93  Sodium 135 - 145 mmol/L 142 140 140  Potassium 3.5 - 5.1 mmol/L 3.8 4.2 3.9  Chloride 98 - 111 mmol/L 103 105 106  CO2 22 - 32 mmol/L _1 Calcium 8.9 - 10.3 mg/dL 9.8 9.9 10.0  Total Protein 6.5 - 8.1 g/dL 7.0 - 7.3  Total Bilirubin 0.3 - 1.2 mg/dL 0.5 - 0.7  Alkaline Phos 38 - 126 U/L 50 - 47  AST 15 - 41 U/L 21 - 21  ALT 0 - 44 U/L 10 - 16      RADIOGRAPHIC STUDIES: I have personally reviewed the radiological images as listed and agreed with the findings in the report. No results found.   ASSESSMENT & PLAN:  Markiesha Delia is a 87 y.o. female with   1.Malignant neoplasm of the left breast, grade 2, ER 95% strongly positive, PR 95% strongly positive, HER-2 negative (1+) Ki-67 10%, cT2 N0 M0 stage Ib -She was diagnosed in 10/2019 after screening mammogram. Biopsy showed invasive ductal carcinoma.  -She underwent left mastectomy by Dr Johnny Bridge on 12/08/19. Her surgical path showed grade II 2.8cm of invasive ductal carcinoma was completely removed with clear margins. I reviewed with patient today.  -she does not requires post-mastectomy Radiation.  -Given the strong ER and PR positivity, I do recommend adjuvant aromatase inhibitor with anastrozole or Tamoxifen for 5 years to reduce her risk of cancer recurrence. She was prescribed anastrozole 01/2020  but never started due to concerns for bone health. Once we obtain the DEXA results from her PCP, we will move forward with either anastrozole or tamoxifen.  I reviewed the potential side effects with her again, especially risk of thrombosis from tamoxifen, but no risk of endometrial cancer due to her hysterectomy.  She voiced good understanding and is agreeable to proceed. -For surveillance, she will continue annual self exams, and a routine office visit with lab and exam with [place].  -Phone visit in 3 months and office visit with lab in  6 months  2.History of recurrent right breast DCIS  -Diagnosed initially in 1993, s/p lumpectomy and adjuvant radiation. She reportedly tried antiestrogen therapy but did not tolerate well. -She had a recurrence of ER/PR positive DCIS in 07/2005, s/p right mastectomy on 08/24/2005  3.H/O GIST -Diagnosed in 1997, s/p partial gastrectomy,followed by Dr. Ardis Hughs  -no clinical signs of recurrence  4. Bone health  -Most recent DEXA was done at Dr. Loren Racer office on 03/25/20. We will request the results.  5.Genetics -She has personal h/o recurrent DCIS, GIST and left breast cancer. She qualifies for genetics. -Sister passed from cancer, unknown type, brother with metastatic prostate cancer. Does not have children, has sibling and nieces -She was previously referred to Genetics, but will think about proceeding.   6.SocialSupport  -Sister passed in June from cancer, she was depressed for 3-4 months in 2021 but better recently -Brother with metastatic prostate cancer on recent workup. -She lives alone but has brother who lives in town.    PLAN: -We will obtain DEXA results from Dr. Loren Racer office -I will contact her with the results and recommendations for antiestrogens anastrozole vs tamoxifen  -telephone follow up in 3 months  -Lab and f/u in 6 months    No problem-specific Assessment & Plan notes found for this encounter.   No orders of the  defined types were placed in this encounter.  All questions were answered. The patient knows to call the clinic with any problems, questions or concerns. No barriers to learning was detected. The total time spent in the appointment was 30 minutes.     Truitt Merle, MD 07/07/2020   I, Wilburn Mylar, am acting as scribe for Truitt Merle, MD.   I have reviewed the above documentation for accuracy and completeness, and I agree with the above.

## 2020-07-20 DIAGNOSIS — I10 Essential (primary) hypertension: Secondary | ICD-10-CM | POA: Diagnosis not present

## 2020-07-20 NOTE — Progress Notes (Signed)
Follow up visit  Subjective:   Cindy Wong, female    DOB: August 12, 1931, 87 y.o.   MRN: [id number]   Chief Complaint  Patient presents with  . Leg Swelling  . Follow-up     HPI  87 year old African-American female with controlled hypertension, type 2 diabetes mellitus, hyperlipidemia, seen for frequent PVCs/ventricular bigeminy, abnormal stress test.   Patient presents for urgent visit with concerns of bilateral lower leg edema.  She was last seen in our office 04/27/2020 by Dr. Virgina Jock, which time he ordered ABI due to bilateral leg pain.  ABI revealed mildly reduced perfusion bilaterally.  Patient reports intermittent bilateral ankle swelling over the last 2-3 months.  Reports this occurs a couple times per week, without identifiable triggers and she has noticed it at different times throughout the day.  Patient does admit to increased salt intake, particularly eating out more frequently due to high levels of family stress presently.  She denies chest pain, shortness of breath, palpitations, orthopnea, PND.  Notably at last visit patient reported similar symptoms of leg swelling.  She is otherwise doing very well with home blood pressure readings averaging 134/73 mmHg.   Current Outpatient Medications on File Prior to Visit  Medication Sig Dispense Refill  . amLODipine (NORVASC) 5 MG tablet Take 5 mg by mouth daily. In the morning    . anastrozole (ARIMIDEX) 1 MG tablet Take 1 mg by mouth daily.    . carvedilol (COREG) 6.25 MG tablet TAKE 1 TABLET(6.25 MG) BY MOUTH TWICE DAILY 180 tablet 0  . Cholecalciferol (D2000 ULTRA STRENGTH) 50 MCG (2000 UT) CAPS Take by mouth daily.    Marland Kitchen glipiZIDE (GLUCOTROL XL) 10 MG 24 hr tablet Take 10 mg by mouth daily.    . hydrochlorothiazide (HYDRODIURIL) 25 MG tablet Take 25 mg by mouth daily. In the morning  10  . linaGLIPtin-metFORMIN HCl 2.5-500 MG TABS Take 1 tablet by mouth 2 (two) times daily.     Marland Kitchen lisinopril (PRINIVIL,ZESTRIL) 40 MG  tablet Take 40 mg by mouth daily. In the morning  0  . Multiple Vitamins-Minerals (CENTRUM SILVER PO) Take 1 tablet by mouth daily.     Cindy Wong VERIO test strip 2 (two) times daily. for testing  2  . potassium chloride SA (K-DUR,KLOR-CON) 20 MEQ tablet Take 20 mEq by mouth daily.    . pravastatin (PRAVACHOL) 40 MG tablet Take 40 mg by mouth every evening.    Marland Kitchen ROYAL JELLY PO Take by mouth daily.     No current facility-administered medications on file prior to visit.    Cardiovascular studies: ABI 05/13/2020:  This exam reveals moderately decreased perfusion of the right lower extremity, noted at the anterior tibial and post tibial artery level (ABI 0.79) and moderately decreased perfusion of the left lower extremity, noted at the post tibial artery level (ABI 0.79). There is mildly abnormal biphasic waveform pattern at the level of the ankles.  EKG 04/27/2020: Sinus rhythm 74 bpm Left atrial enlargement.  Nonspecific T wave abnormality Compared to previous EKG's, PVCs now absent  Echocardiogram 10/16/2018: Left ventricle cavity is normal in size. Normal left ventricular wall thickness. Normal LV systolic function with EF 55%. Normal global wall motion. Diastolic function assessment limited due to frequent PVC's. Calculated EF 55%. Trileaflet aortic valve with mild thickening. Trace aortic stenosis. No regurgitation noted. Mild tricuspid regurgitation. Trace pulmonic regurgitation.  No evidence of pulmonary hypertension.  Lexiscan Myoview stress test 09/29/2018: Lexiscan stress test was performed. Stress EKG  is non-diagnostic, as this is pharmacological stress test. In addition, rest and stress EKG demonstrate sinus rhythm, frequent PVC's, inferolateral nonspecific ST-T changes.  LVEF 63%. SPECT stress and rest images demonstrate very small size, mild intensity, fixed perfusion defect in basal inferoseptal myocardium. While the defect is small, TID of 1.31 with regional perfusion defect  is considered high risk finding. Clinical correlation recommended.   EKG 09/15/2018: Sinus rhythm 77 bpm. Frequent PVC  Carotid US 08/14/2018: Right Carotid: Velocities in the right ICA are consistent with a 1-39% stenosis.                Non-hemodynamically significant plaque <50% noted in the CCA. The                ECA appears <50% stenosed.  Left Carotid: Velocities in the left ICA are consistent with a 1-39% stenosis.               Non-hemodynamically significant plaque noted in the CCA.               Heterogenous multinodular thyroid gland incidentally noted.   Recent labs: 07/07/2020: Hemoglobin 13.7, hematocrit 43.4, MCV 84.8, platelet 267 Sodium 142, potassium 3.8, glucose 106, BUN 14, creatinine 0.85, AST 21, ALT 10, alk phos 50, GFR >60  12/04/2019: Glucose 114, BUN/Cr 23/1.02. EGFR 50. Na/K 140/4.2. Rest of the CMP normal H/H 14/47. MCV 83. Platelets 277 HbA1C N/A Lipid panel N/A TSH N/A  11/21/2018: Glucose 101, BUN/Cr 18/0.89. EGFR >60. Na/K 142/4.1. Rest of the CMP normal H/H 15/48. MCV 85. Platelets 268   Review of Systems  Constitutional: Positive for malaise/fatigue. Negative for weight gain.  Cardiovascular: Positive for leg swelling (intermittent over last 2-3 months). Negative for chest pain, claudication, dyspnea on exertion, near-syncope, orthopnea, palpitations, paroxysmal nocturnal dyspnea and syncope.  Hematologic/Lymphatic: Does not bruise/bleed easily.  Gastrointestinal: Negative for melena.  Neurological: Negative for dizziness and weakness.         Vitals:   07/21/20 1101  BP: (!) 141/71  Pulse: 73  Temp: 98 F (36.7 C)  SpO2: 99%     Body mass index is 22.05 kg/m. Filed Weights   07/21/20 1101  Weight: 145 lb (65.8 kg)     Objective:   Physical Exam Vitals and nursing note reviewed.  Constitutional:      General: She is not in acute distress.    Appearance: She is well-developed.  HENT:     Head: Normocephalic and  atraumatic.  Neck:     Vascular: No JVD.  Cardiovascular:     Rate and Rhythm: Normal rate and regular rhythm.     Pulses: Normal pulses and intact distal pulses.     Heart sounds: Normal heart sounds, S1 normal and S2 normal. No murmur heard. No gallop.   Pulmonary:     Effort: Pulmonary effort is normal. No respiratory distress.     Breath sounds: Normal breath sounds. No wheezing, rhonchi or rales.  Musculoskeletal:     Right lower leg: Edema (trace ankle) present.     Left lower leg: Edema (trace ankle) present.  Neurological:     Mental Status: She is alert.           Assessment & Recommendations:   87 year old African-American female with controlled hypertension, type 2 diabetes mellitus, hyperlipidemia, seen for frequent PVCs/ventricular bigeminy, abnormal stress test  Bilateral Leg Swelling:  Suspect this may be related to creased dietary salt intake.  We will obtain echocardiogram to reevaluate  left ventricular systolic function.  Notably patient has had this concern since last visit, and symptoms have remained stable.  Hypertension: Fairly well controlled, however suspect mild elevation in blood pressure today in the office may be related to increased salt intake reported by patient. Discussed at length regarding DASH diet and encouraged patient to focus on making diet modifications in order to further improve blood pressure control. Will not make changes to antihypertensive medications at this time.  Continue amlodipine, carvedilol, hydrochlorothiazide lisinopril. Continue remote blood pressure monitoring with our office.  Per Dr. Virgina Jock at previous office visit: Structurally normal heart. Stress test shows TID. However, she does not have any angina symptoms at this time.  Given advanced age and absence of angina, do not recommend Aspirin at this time. Okay to continue pravastatin.   Leg pain:  Reviewed and discussed with patient regarding results of recent ABI  which revealed mildly reduced perfusion bilaterally to lower extremities. Patient is without open wounds or non-healing ulcers, therefore recommend graduated exercise and medical management.   Follow up in 8 weeks for blood pressure management and leg swelling.    Alethia Berthold, PA-C 07/21/2020, 11:58 AM Office: [phone]

## 2020-07-21 ENCOUNTER — Ambulatory Visit: Payer: Medicare Other | Admitting: Student

## 2020-07-21 ENCOUNTER — Other Ambulatory Visit: Payer: Self-pay

## 2020-07-21 ENCOUNTER — Encounter: Payer: Self-pay | Admitting: Student

## 2020-07-21 VITALS — BP 141/71 | HR 73 | Temp 98.0°F | Ht 68.0 in | Wt 145.0 lb

## 2020-07-21 DIAGNOSIS — I739 Peripheral vascular disease, unspecified: Secondary | ICD-10-CM

## 2020-07-21 DIAGNOSIS — R600 Localized edema: Secondary | ICD-10-CM

## 2020-07-21 DIAGNOSIS — I10 Essential (primary) hypertension: Secondary | ICD-10-CM

## 2020-07-21 NOTE — Patient Instructions (Signed)

## 2020-08-09 ENCOUNTER — Ambulatory Visit: Payer: Medicare Other

## 2020-08-09 ENCOUNTER — Other Ambulatory Visit: Payer: Self-pay

## 2020-08-09 DIAGNOSIS — R600 Localized edema: Secondary | ICD-10-CM

## 2020-08-12 NOTE — Progress Notes (Signed)
Echo stable and normal pumping activity.

## 2020-08-12 NOTE — Progress Notes (Signed)
Called and spoke to pt, pt voiced understanding.

## 2020-08-18 DIAGNOSIS — I10 Essential (primary) hypertension: Secondary | ICD-10-CM | POA: Diagnosis not present

## 2020-08-30 ENCOUNTER — Other Ambulatory Visit: Payer: Self-pay

## 2020-08-30 ENCOUNTER — Ambulatory Visit (INDEPENDENT_AMBULATORY_CARE_PROVIDER_SITE_OTHER): Payer: Medicare Other | Admitting: Podiatry

## 2020-08-30 ENCOUNTER — Encounter: Payer: Self-pay | Admitting: Podiatry

## 2020-08-30 DIAGNOSIS — E1151 Type 2 diabetes mellitus with diabetic peripheral angiopathy without gangrene: Secondary | ICD-10-CM

## 2020-08-30 DIAGNOSIS — M79676 Pain in unspecified toe(s): Secondary | ICD-10-CM | POA: Diagnosis not present

## 2020-08-30 DIAGNOSIS — B351 Tinea unguium: Secondary | ICD-10-CM

## 2020-08-30 NOTE — Progress Notes (Signed)
This patient returns to my office for at risk foot care.  This patient requires this care by a professional since this patient will be at risk due to having diabetes.   This patient is unable to cut nails herself since the patient cannot reach her nails.  These nails is painful walking and wearing her shoes. . This patient presents for at risk foot care today.  General Appearance  Alert, conversant and in no acute stress.  Vascular  Dorsalis pedis  pulses are palpable  Bilaterally. Posterior tibial pulses are absent  B/L.  Capillary return is within normal limits  bilaterally. Temperature is within normal limits  Bilaterally  .Absent digital hair.  Neurologic  Senn-Weinstein monofilament wire test within normal limits  bilaterally. Muscle power within normal limits bilaterally.  Nails Thick disfigured discolored nails with subungual debris  from hallux to fifth toes bilaterally. No evidence of bacterial infection or drainage bilaterally.  Orthopedic  No limitations of motion  feet .  No crepitus or effusions noted.  No bony pathology or digital deformities noted.  midfoot arthritis  B/L.  Skin  normotropic skin with no porokeratosis noted bilaterally.  No signs of infections or ulcers noted.   Porokeratosis sub 5th met right foot. Asymptomatic. Asymptomatic  HD 5th toe left.  Onychomycosis  Pain in right toes  Pain in left toes  .  Consent was obtained for treatment procedures.   Mechanical debridement of nails 1-5  bilaterally performed with a nail nipper.  Filed with dremel without incident.    Return office visit    3 months                  Told patient to return for periodic foot care and evaluation due to potential at risk complications.   Gregory Mayer DPM  

## 2020-09-12 DIAGNOSIS — L821 Other seborrheic keratosis: Secondary | ICD-10-CM | POA: Diagnosis not present

## 2020-09-12 DIAGNOSIS — D229 Melanocytic nevi, unspecified: Secondary | ICD-10-CM | POA: Diagnosis not present

## 2020-09-13 DIAGNOSIS — E119 Type 2 diabetes mellitus without complications: Secondary | ICD-10-CM | POA: Diagnosis not present

## 2020-09-13 DIAGNOSIS — E559 Vitamin D deficiency, unspecified: Secondary | ICD-10-CM | POA: Diagnosis not present

## 2020-09-13 DIAGNOSIS — E7800 Pure hypercholesterolemia, unspecified: Secondary | ICD-10-CM | POA: Diagnosis not present

## 2020-09-13 NOTE — Progress Notes (Signed)
Follow up visit  Subjective:   Cindy Wong, female    DOB: 04/03/1931, 87 y.o.   MRN: [id number]   Chief Complaint  Patient presents with   Leg Swelling   Follow-up   Results     HPI  87 year old African-American female with controlled hypertension, type 2 diabetes mellitus, hyperlipidemia, seen for frequent PVCs/ventricular bigeminy, abnormal stress test.    Patient with history of left breast cancer diagnosed 10/2019, underwent left mastectomy 12/08/2019, continues to follow with Dr. Burr Medico.   Patient presents for 8-week follow-up for blood pressure management and leg swelling.  At last visit ordered echocardiogram, which is unchanged compared to previous in August 2020 with LVEF 63% and grade 1 diastolic dysfunction as well as mild valvular disease.  Patient is feeling well with no recurrence of leg swelling since last visit.  Blood pressure remains well controlled at home. She denies chest pain, shortness of breath, palpitations, orthopnea, PND.  Current Outpatient Medications on File Prior to Visit  Medication Sig Dispense Refill   amLODipine (NORVASC) 5 MG tablet Take 5 mg by mouth daily. In the morning     anastrozole (ARIMIDEX) 1 MG tablet Take 1 mg by mouth daily.     carvedilol (COREG) 6.25 MG tablet TAKE 1 TABLET(6.25 MG) BY MOUTH TWICE DAILY 180 tablet 0   Cholecalciferol (D2000 ULTRA STRENGTH) 50 MCG (2000 UT) CAPS Take by mouth daily.     glipiZIDE (GLUCOTROL XL) 10 MG 24 hr tablet Take 10 mg by mouth daily.     hydrochlorothiazide (HYDRODIURIL) 25 MG tablet Take 25 mg by mouth daily. In the morning  10   linaGLIPtin-metFORMIN HCl 2.5-500 MG TABS Take 1 tablet by mouth 2 (two) times daily.      lisinopril (PRINIVIL,ZESTRIL) 40 MG tablet Take 40 mg by mouth daily. In the morning  0   Multiple Vitamins-Minerals (CENTRUM SILVER PO) Take 1 tablet by mouth daily.      ONETOUCH VERIO test strip 2 (two) times daily. for testing  2   potassium chloride SA  (K-DUR,KLOR-CON) 20 MEQ tablet Take 20 mEq by mouth daily.     pravastatin (PRAVACHOL) 40 MG tablet Take 40 mg by mouth every evening.     ROYAL JELLY PO Take by mouth daily.     No current facility-administered medications on file prior to visit.    Cardiovascular studies: Echocardiogram 08/09/2020:  Left ventricle cavity is normal in size and wall thickness. Normal global  wall motion. Normal LV systolic function with EF 63%. Doppler evidence of  grade I (impaired) diastolic dysfunction, normal LAP.  Trace aortic valve stenosis.  Mild mitral leaflet thickening. No significant stenosis or regurgitation.  Mild tricuspid regurgitation.  Mild pulmonic regurgitation.  No significant change compared to previous study on 10/16/2018.  ABI 05/13/2020:  This exam reveals moderately decreased perfusion of the right lower extremity, noted at the anterior tibial and post tibial artery level (ABI 0.79) and moderately decreased perfusion of the left lower extremity, noted at the post tibial artery level (ABI 0.79). There is mildly abnormal biphasic waveform pattern at the level of the ankles.  EKG 04/27/2020: Sinus rhythm 74 bpm Left atrial enlargement.  Nonspecific T wave abnormality Compared to previous EKG's, PVCs now absent  Echocardiogram 10/16/2018: Left ventricle cavity is normal in size. Normal left ventricular wall thickness. Normal LV systolic function with EF 55%. Normal global wall motion. Diastolic function assessment limited due to frequent PVC's. Calculated EF 55%. Trileaflet aortic valve with mild  thickening. Trace aortic stenosis. No regurgitation noted. Mild tricuspid regurgitation. Trace pulmonic regurgitation.  No evidence of pulmonary hypertension.  Lexiscan Myoview stress test 09/29/2018: Lexiscan stress test was performed. Stress EKG is non-diagnostic, as this is pharmacological stress test. In addition, rest and stress EKG demonstrate sinus rhythm, frequent PVC's,  inferolateral nonspecific ST-T changes.  LVEF 63%. SPECT stress and rest images demonstrate very small size, mild intensity, fixed perfusion defect in basal inferoseptal myocardium. While the defect is small, TID of 1.31 with regional perfusion defect is considered high risk finding. Clinical correlation recommended.   EKG 09/15/2018: Sinus rhythm 77 bpm. Frequent PVC  Carotid US 08/14/2018: Right Carotid: Velocities in the right ICA are consistent with a 1-39% stenosis.                Non-hemodynamically significant plaque <50% noted in the CCA. The                ECA appears <50% stenosed.   Left Carotid: Velocities in the left ICA are consistent with a 1-39% stenosis.               Non-hemodynamically significant plaque noted in the CCA.               Heterogenous multinodular thyroid gland incidentally noted.   Recent labs: 07/07/2020: Hemoglobin 13.7, hematocrit 43.4, MCV 84.8, platelet 267 Sodium 142, potassium 3.8, glucose 106, BUN 14, creatinine 0.85, AST 21, ALT 10, alk phos 50, GFR >60  12/04/2019: Glucose 114, BUN/Cr 23/1.02. EGFR 50. Na/K 140/4.2. Rest of the CMP normal H/H 14/47. MCV 83. Platelets 277 HbA1C N/A Lipid panel N/A TSH N/A  11/21/2018: Glucose 101, BUN/Cr 18/0.89. EGFR >60. Na/K 142/4.1. Rest of the CMP normal H/H 15/48. MCV 85. Platelets 268   Review of Systems  Constitutional: Negative for weight gain.  Cardiovascular:  Negative for claudication, leg swelling (no reccurence since last visit), near-syncope, orthopnea and paroxysmal nocturnal dyspnea.  Hematologic/Lymphatic: Does not bruise/bleed easily.  Gastrointestinal:  Negative for melena.  Neurological:  Negative for dizziness and weakness.        Vitals:   09/15/20 1059  BP: 138/64  Pulse: 72  Temp: 98 F (36.7 C)  SpO2: 100%     Body mass index is 21.9 kg/m. Filed Weights   09/15/20 1059  Weight: 144 lb (65.3 kg)     Objective:   Physical Exam Vitals reviewed.   Constitutional:      General: She is not in acute distress. Cardiovascular:     Rate and Rhythm: Normal rate and regular rhythm.     Pulses: Intact distal pulses.     Heart sounds: S1 normal and S2 normal. No murmur heard.   No gallop.  Pulmonary:     Effort: Pulmonary effort is normal. No respiratory distress.     Breath sounds: No wheezing, rhonchi or rales.  Musculoskeletal:     Right lower leg: No edema.     Left lower leg: No edema.  Neurological:     Mental Status: She is alert.      Assessment & Recommendations:   87 year old African-American female with controlled hypertension, type 2 diabetes mellitus, hyperlipidemia, seen for frequent PVCs/ventricular bigeminy, abnormal stress test.  History of breast cancer status post left mastectomy 11/2019.  Bilateral Leg Swelling:  Echocardiogram unchanged with normal LVEF and mild valvular disease. Patient has had no recurrence of bilateral leg swelling with conservative measures.  Hypertension: Well-controlled.  Although patient does continue to have episodes  of elevated blood pressure at home following salty meals.  Again discussed importance of compliance with DASH diet. Will not make changes to antihypertensive medications at this time.  Continue amlodipine, carvedilol, hydrochlorothiazide lisinopril. Continue remote blood pressure monitoring with our office.  PAD: ABI revealed mildly reduced perfusion bilaterally to lower extremities.  Patient denies symptoms of claudication and is without wound or nonhealing ulcers. Recommend graduated exercise  Follow-up in 1 year as previously scheduled, sooner if needed, for hypertension and PAD.    Alethia Berthold, PA-C 09/15/2020, 12:00 PM Office: [phone]

## 2020-09-15 ENCOUNTER — Ambulatory Visit: Payer: Medicare Other | Admitting: Student

## 2020-09-15 ENCOUNTER — Encounter: Payer: Self-pay | Admitting: Student

## 2020-09-15 ENCOUNTER — Other Ambulatory Visit: Payer: Self-pay

## 2020-09-15 VITALS — BP 138/64 | HR 72 | Temp 98.0°F | Ht 68.0 in | Wt 144.0 lb

## 2020-09-15 DIAGNOSIS — I739 Peripheral vascular disease, unspecified: Secondary | ICD-10-CM | POA: Diagnosis not present

## 2020-09-15 DIAGNOSIS — I10 Essential (primary) hypertension: Secondary | ICD-10-CM | POA: Diagnosis not present

## 2020-09-15 DIAGNOSIS — R600 Localized edema: Secondary | ICD-10-CM | POA: Diagnosis not present

## 2020-09-20 DIAGNOSIS — H9191 Unspecified hearing loss, right ear: Secondary | ICD-10-CM | POA: Diagnosis not present

## 2020-09-20 DIAGNOSIS — E7800 Pure hypercholesterolemia, unspecified: Secondary | ICD-10-CM | POA: Diagnosis not present

## 2020-09-20 DIAGNOSIS — K222 Esophageal obstruction: Secondary | ICD-10-CM | POA: Diagnosis not present

## 2020-09-20 DIAGNOSIS — Z9010 Acquired absence of unspecified breast and nipple: Secondary | ICD-10-CM | POA: Diagnosis not present

## 2020-09-20 DIAGNOSIS — E119 Type 2 diabetes mellitus without complications: Secondary | ICD-10-CM | POA: Diagnosis not present

## 2020-09-20 DIAGNOSIS — Z0000 Encounter for general adult medical examination without abnormal findings: Secondary | ICD-10-CM | POA: Diagnosis not present

## 2020-09-20 DIAGNOSIS — C50919 Malignant neoplasm of unspecified site of unspecified female breast: Secondary | ICD-10-CM | POA: Diagnosis not present

## 2020-09-20 DIAGNOSIS — Z853 Personal history of malignant neoplasm of breast: Secondary | ICD-10-CM | POA: Diagnosis not present

## 2020-09-20 DIAGNOSIS — F331 Major depressive disorder, recurrent, moderate: Secondary | ICD-10-CM | POA: Diagnosis not present

## 2020-09-20 DIAGNOSIS — E559 Vitamin D deficiency, unspecified: Secondary | ICD-10-CM | POA: Diagnosis not present

## 2020-09-20 DIAGNOSIS — I10 Essential (primary) hypertension: Secondary | ICD-10-CM | POA: Diagnosis not present

## 2020-09-20 DIAGNOSIS — D692 Other nonthrombocytopenic purpura: Secondary | ICD-10-CM | POA: Diagnosis not present

## 2020-09-20 DIAGNOSIS — R82998 Other abnormal findings in urine: Secondary | ICD-10-CM | POA: Diagnosis not present

## 2020-09-23 DIAGNOSIS — I10 Essential (primary) hypertension: Secondary | ICD-10-CM | POA: Diagnosis not present

## 2020-10-03 ENCOUNTER — Other Ambulatory Visit: Payer: Self-pay | Admitting: Cardiology

## 2020-10-03 DIAGNOSIS — I493 Ventricular premature depolarization: Secondary | ICD-10-CM

## 2020-10-06 ENCOUNTER — Other Ambulatory Visit: Payer: Self-pay

## 2020-10-06 ENCOUNTER — Inpatient Hospital Stay: Payer: Medicare Other | Attending: Nurse Practitioner | Admitting: Hematology

## 2020-10-06 VITALS — BP 133/59 | HR 66 | Temp 98.2°F | Wt 149.5 lb

## 2020-10-06 DIAGNOSIS — I10 Essential (primary) hypertension: Secondary | ICD-10-CM | POA: Diagnosis not present

## 2020-10-06 DIAGNOSIS — Z923 Personal history of irradiation: Secondary | ICD-10-CM | POA: Insufficient documentation

## 2020-10-06 DIAGNOSIS — Z9013 Acquired absence of bilateral breasts and nipples: Secondary | ICD-10-CM | POA: Insufficient documentation

## 2020-10-06 DIAGNOSIS — Z79899 Other long term (current) drug therapy: Secondary | ICD-10-CM | POA: Insufficient documentation

## 2020-10-06 DIAGNOSIS — R5383 Other fatigue: Secondary | ICD-10-CM | POA: Diagnosis not present

## 2020-10-06 DIAGNOSIS — Z170 Estrogen receptor positive status [ER+]: Secondary | ICD-10-CM

## 2020-10-06 DIAGNOSIS — Z882 Allergy status to sulfonamides status: Secondary | ICD-10-CM | POA: Diagnosis not present

## 2020-10-06 DIAGNOSIS — C50812 Malignant neoplasm of overlapping sites of left female breast: Secondary | ICD-10-CM | POA: Insufficient documentation

## 2020-10-06 DIAGNOSIS — Z853 Personal history of malignant neoplasm of breast: Secondary | ICD-10-CM | POA: Insufficient documentation

## 2020-10-06 DIAGNOSIS — Z85028 Personal history of other malignant neoplasm of stomach: Secondary | ICD-10-CM | POA: Insufficient documentation

## 2020-10-06 DIAGNOSIS — R61 Generalized hyperhidrosis: Secondary | ICD-10-CM | POA: Insufficient documentation

## 2020-10-06 DIAGNOSIS — N6489 Other specified disorders of breast: Secondary | ICD-10-CM | POA: Diagnosis not present

## 2020-10-06 DIAGNOSIS — C50912 Malignant neoplasm of unspecified site of left female breast: Secondary | ICD-10-CM | POA: Diagnosis not present

## 2020-10-06 DIAGNOSIS — R232 Flushing: Secondary | ICD-10-CM | POA: Diagnosis not present

## 2020-10-06 DIAGNOSIS — Z79811 Long term (current) use of aromatase inhibitors: Secondary | ICD-10-CM | POA: Insufficient documentation

## 2020-10-06 DIAGNOSIS — Z903 Acquired absence of stomach [part of]: Secondary | ICD-10-CM | POA: Insufficient documentation

## 2020-10-06 DIAGNOSIS — Z8509 Personal history of malignant neoplasm of other digestive organs: Secondary | ICD-10-CM

## 2020-10-06 DIAGNOSIS — Z90721 Acquired absence of ovaries, unilateral: Secondary | ICD-10-CM | POA: Insufficient documentation

## 2020-10-06 DIAGNOSIS — Z9049 Acquired absence of other specified parts of digestive tract: Secondary | ICD-10-CM | POA: Insufficient documentation

## 2020-10-06 MED ORDER — GABAPENTIN 100 MG PO CAPS: 100.0000 mg | ORAL_CAPSULE | Freq: Every day | ORAL | 0 refills | Status: DC

## 2020-10-06 NOTE — Progress Notes (Signed)
[place]   Telephone:([phone] Fax:(336) [phone]   Clinic Follow up Note   Patient Care Team: Tisovec, Fransico Him, MD as PCP - Philomena Doheny, Paulette Blanch, RN as Oncology Nurse Navigator Rockwell Germany, RN as Oncology Nurse Navigator Truitt Merle, MD as Consulting Physician (Hematology) Alla Feeling, NP as Nurse Practitioner (Nurse Practitioner) Donnie Mesa, MD as Consulting Physician (General Surgery)  Date of Service:  10/06/2020  CHIEF COMPLAINT: f/u of left breast cancer, h/o right DCIS, and h/o GIST  SUMMARY OF ONCOLOGIC HISTORY: Oncology History Overview Note  Cancer Staging Malignant neoplasm of central left breast [facility]) Staging form: Breast, AJCC 8th Edition - Clinical stage from 11/19/2019: Stage IB (cT2, cN0, cM0, G2, ER+, PR+, HER2-) - Signed by Alla Feeling, NP on 11/23/2019 - Pathologic stage from 12/08/2019: Stage Unknown (pT2, pNX, cM0, G2, ER+, PR+, HER2-) - Signed by Truitt Merle, MD on 12/25/2019    Malignant neoplasm of central left breast ([place])  11/09/2019 Breast US   IMPRESSION: 1. Highly suspicious left breast mass at the 12 o'clock position 4 cm from the nipple. Recommendation is for ultrasound-guided biopsy. 2. Indeterminate hyperechoic mass at the 12 o'clock position 4 cm from the nipple, just superior to the index lesion. This may correspond with an additional asymmetry seen mammographically. Recommendation is for ultrasound-guided biopsy with close attention on post clip films to ensure mammographic correlation. 3. No suspicious left axillary lymphadenopathy. 4. No suspicious findings at the site of the right mastectomy bed palpable lump.   11/19/2019 Cancer Staging   Staging form: Breast, AJCC 8th Edition - Clinical stage from 11/19/2019: Stage IB (cT2, cN0, cM0, G2, ER+, PR+, HER2-) - Signed by Alla Feeling, NP on 11/23/2019   11/19/2019 Initial Biopsy   Diagnosis Breast, left, needle core biopsy, 12 o'clock - INVASIVE  MAMMARY CARCINOMA - SEE COMMENT E-cadherin is POSITIVE supporting a ductal origin. PROGNOSTIC INDICATORS Results: IMMUNOHISTOCHEMICAL AND MORPHOMETRIC ANALYSIS PERFORMED MANUALLY The tumor cells are NEGATIVE for Her2 (1+). Estrogen Receptor: 95%, POSITIVE, STRONG STAINING INTENSITY Progesterone Receptor: 95%, POSITIVE, STRONG STAINING INTENSITY Proliferation Marker Ki67: 10%   11/23/2019 Initial Diagnosis   Malignant neoplasm of left breast ([place])   11/29/2019 Breast MRI   IMPRESSION: 1. 3.1 cm biopsy-proven malignancy within the UPPER LEFT breast. 2. Indeterminate 0.9 cm posterior central/LOWER LEFT breast mass. If breast conservation is desired, 2nd-look ultrasound and possible biopsy is recommended. If this mass is not identified sonographically, than MR guided biopsy would be recommended. 3. No abnormal appearing lymph nodes. 4. RIGHT mastectomy without suspicious abnormalities in the RIGHT mastectomy bed.   12/08/2019 Cancer Staging   Staging form: Breast, AJCC 8th Edition - Pathologic stage from 12/08/2019: Stage Unknown (pT2, pNX, cM0, G2, ER+, PR+, HER2-) - Signed by Truitt Merle, MD on 12/25/2019   12/08/2019 Surgery   Left Mastectomy by Dr Georgette Dover   12/08/2019 Pathology Results   FINAL MICROSCOPIC DIAGNOSIS:   A. BREAST, LEFT, MASTECTOMY:  - Invasive ductal carcinoma, multifocal, 2.8 cm in greatest dimension,  [place] grade 2 of 3.  - Margins of resection are not involved.  - See oncology table.  - See oncology table.    03/2020 -  Anti-estrogen oral therapy   Anastrozole 60m once daily starting 01/2020   03/22/2020 Survivorship   SCP delivered by LCira Rue NP       CURRENT THERAPY:  Anastrozole, starting 07/07/20  INTERVAL HISTORY:  Cindy Wong is here for a follow up of breast cancer.  She was last seen by me on 07/07/20. She presents to the clinic alone. She reports fatigue and hot flashes since starting the anastrozole. She notes she doesn't sleep  well at baseline, but the fatigue is worse with the anastrozole. She does note the hot flashes mostly occur at night and causes night sweats and can wake her up.   All other systems were reviewed with the patient and are negative.  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   partial gastrectomy    SURGICAL HISTORY: Past Surgical History:  Procedure Laterality Date   ABDOMINAL HYSTERECTOMY  1974   Supracervical   BREAST LUMPECTOMY Right 1993   BREAST SURGERY     Mastectomy   CHOLECYSTECTOMY     COLONOSCOPY     MASTECTOMY Right    MASTECTOMY, PARTIAL Left 12/08/2019   Procedure: LEFT MASTECTOMY;  Surgeon: Donnie Mesa, MD;  Location: [facility];  Service: General;  Laterality: Left;  LMA AND PECTORAL BLOCK   meningitis     OOPHORECTOMY     One ovary removed--?side   Stomach tumor     UPPER GASTROINTESTINAL ENDOSCOPY      I have reviewed the social history and family history with the patient and they are unchanged from previous note.  ALLERGIES:  is allergic to sulfamethoxazole and sulfonamide derivatives.  MEDICATIONS:  Current Outpatient Medications  Medication Sig Dispense Refill   gabapentin (NEURONTIN) 100 MG capsule Take 1 capsule (100 mg total) by mouth at bedtime. 30 capsule 0   amLODipine (NORVASC) 5 MG tablet Take 5 mg by mouth daily. In the morning     anastrozole (ARIMIDEX) 1 MG tablet Take 1 mg by mouth daily.     carvedilol (COREG) 6.25 MG tablet TAKE 1 TABLET(6.25 MG) BY MOUTH TWICE DAILY 180 tablet 0   Cholecalciferol (D2000 ULTRA STRENGTH) 50 MCG  (2000 UT) CAPS Take by mouth daily.     glipiZIDE (GLUCOTROL XL) 10 MG 24 hr tablet Take 10 mg by mouth daily.     hydrochlorothiazide (HYDRODIURIL) 25 MG tablet Take 25 mg by mouth daily. In the morning  10   linaGLIPtin-metFORMIN HCl 2.5-500 MG TABS Take 1 tablet by mouth 2 (two) times daily.      lisinopril (PRINIVIL,ZESTRIL) 40 MG tablet Take 40 mg by mouth daily. In the morning  0   Multiple Vitamins-Minerals (CENTRUM SILVER PO) Take 1 tablet by mouth daily.      ONETOUCH VERIO test strip 2 (two) times daily. for testing  2   potassium chloride SA (K-DUR,KLOR-CON) 20 MEQ tablet Take 20 mEq by mouth daily.     pravastatin (PRAVACHOL) 40 MG tablet Take 40 mg by mouth every evening.     ROYAL JELLY PO Take by mouth daily.     No current facility-administered medications for this visit.    PHYSICAL EXAMINATION: ECOG PERFORMANCE STATUS: 1 - Symptomatic but completely ambulatory  Vitals:   10/06/20 1023  BP: (!) 133/59  Pulse:  66  Temp: 98.2 F (36.8 C)  SpO2: 100%   Wt Readings from Last 3 Encounters:  10/06/20 149 lb 8 oz (67.8 kg)  09/15/20 144 lb (65.3 kg)  07/21/20 145 lb (65.8 kg)     GENERAL:alert, no distress and comfortable SKIN: skin color normal, no rashes or significant lesions EYES: normal, Conjunctiva are pink and non-injected, sclera clear  NEURO: alert & oriented x 3 with fluent speech  LABORATORY DATA:  I have reviewed the data as listed CBC Latest Ref Rng & Units 07/07/2020 11/23/2019 11/21/2018  WBC 4.0 - 10.5 K/uL 8.3 7.9 7.3  Hemoglobin 12.0 - 15.0 g/dL 13.7 14.8 15.3(H)  Hematocrit 36.0 - 46.0 % 43.4 47.1(H) 48.9(H)  Platelets 150 - 400 K/uL 267 277 268     CMP Latest Ref Rng & Units 07/07/2020 12/04/2019 11/23/2019  Glucose 70 - 99 mg/dL 106(H) 114(H) 134(H)  BUN 8 - 23 mg/dL _0 Creatinine 0.44 - 1.00 mg/dL 0.85 1.02(H) 0.93  Sodium 135 - 145 mmol/L 142 140 140  Potassium 3.5 - 5.1 mmol/L 3.8 4.2 3.9  Chloride 98 - 111 mmol/L 103 105 106   CO2 22 - 32 mmol/L _1 Calcium 8.9 - 10.3 mg/dL 9.8 9.9 10.0  Total Protein 6.5 - 8.1 g/dL 7.0 - 7.3  Total Bilirubin 0.3 - 1.2 mg/dL 0.5 - 0.7  Alkaline Phos 38 - 126 U/L 50 - 47  AST 15 - 41 U/L 21 - 21  ALT 0 - 44 U/L 10 - 16      RADIOGRAPHIC STUDIES: I have personally reviewed the radiological images as listed and agreed with the findings in the report. No results found.   ASSESSMENT & PLAN:  Cindy Wong is a 87 y.o. female with   1.  Malignant neoplasm of the left breast, grade 2, ER 95% strongly positive, PR 95% strongly positive, HER-2 negative (1+) Ki-67 10%, cT2 N0 M0 stage Ib -She was diagnosed in 10/2019 after screening mammogram. Biopsy showed invasive ductal carcinoma.  -She underwent left mastectomy by Dr Johnny Bridge on 12/08/19. Her surgical path showed grade II 2.8cm of invasive ductal carcinoma was completely removed with clear margins. I reviewed with patient today.  -she does not requires post-mastectomy Radiation.  -She began anastrozole after we discussed it again on 07/07/20. -For surveillance, she will continue annual self exams, and a routine office visit with lab and exam with [place].  -she has labs and f/u already scheduled in 3 months  2. Hot flashes, insomnia, fatigue -secondary to/worsening on anastrozole -she does not sleep well at night at baseline but feel it's worse with anastrozole. -I discussed adding gabapentin to assist with the hot flashes. She would like to try. I recommend she take it only at night.   3. History of recurrent right breast DCIS  -Diagnosed initially in 1993, s/p lumpectomy and adjuvant radiation. She reportedly tried antiestrogen therapy but did not tolerate well. -She had a recurrence of ER/PR positive DCIS in 07/2005, s/p right mastectomy on 08/24/2005   4. H/O GIST -Diagnosed in 1997, s/p partial gastrectomy, followed by Dr. Ardis Hughs  -no clinical signs of recurrence    5. Bone health  -Most recent DEXA was done at Dr.  Loren Racer office on 03/25/20. T-score was -0.7. -We previously discussed the impact of aromatase inhibitors on bone density.   6. Genetics  -She has personal h/o recurrent DCIS, GIST and left breast cancer. She qualifies for genetics.  -Sister passed from  cancer, unknown type, brother with metastatic prostate cancer. Does not have children, has sibling and nieces  -She was previously referred to Genetics, but will think about proceeding.    7. Social Support  -Sister passed in June from cancer, she was depressed for 3-4 months in 2021 but better recently  -Brother with metastatic prostate cancer on recent workup. -She lives alone but has brother who lives in town.    PLAN: -I prescribed gabapentin 166m HS for her today for hot flushes  -continue anastrozole -labs and f/u as scheduled on 01/05/21   No problem-specific Assessment & Plan notes found for this encounter.   No orders of the defined types were placed in this encounter.  All questions were answered. The patient knows to call the clinic with any problems, questions or concerns. No barriers to learning was detected. The total time spent in the appointment was 25 minutes.     YTruitt Merle MD 10/06/2020   I, KWilburn Mylar am acting as scribe for YTruitt Merle MD.   I have reviewed the above documentation for accuracy and completeness, and I agree with the above.

## 2020-10-13 ENCOUNTER — Other Ambulatory Visit: Payer: Self-pay | Admitting: Hematology

## 2020-10-14 NOTE — Telephone Encounter (Signed)
Refilled by Dr. Burr Medico yesterday

## 2020-10-18 DIAGNOSIS — H903 Sensorineural hearing loss, bilateral: Secondary | ICD-10-CM | POA: Diagnosis not present

## 2020-10-20 DIAGNOSIS — H524 Presbyopia: Secondary | ICD-10-CM | POA: Diagnosis not present

## 2020-10-20 DIAGNOSIS — H5203 Hypermetropia, bilateral: Secondary | ICD-10-CM | POA: Diagnosis not present

## 2020-10-20 DIAGNOSIS — H2513 Age-related nuclear cataract, bilateral: Secondary | ICD-10-CM | POA: Diagnosis not present

## 2020-10-20 DIAGNOSIS — E119 Type 2 diabetes mellitus without complications: Secondary | ICD-10-CM | POA: Diagnosis not present

## 2020-10-21 DIAGNOSIS — H903 Sensorineural hearing loss, bilateral: Secondary | ICD-10-CM | POA: Diagnosis not present

## 2020-10-24 DIAGNOSIS — I10 Essential (primary) hypertension: Secondary | ICD-10-CM | POA: Diagnosis not present

## 2020-10-28 DIAGNOSIS — I10 Essential (primary) hypertension: Secondary | ICD-10-CM | POA: Diagnosis not present

## 2020-10-28 DIAGNOSIS — M25562 Pain in left knee: Secondary | ICD-10-CM | POA: Diagnosis not present

## 2020-11-23 DIAGNOSIS — I10 Essential (primary) hypertension: Secondary | ICD-10-CM | POA: Diagnosis not present

## 2020-11-23 DIAGNOSIS — H2511 Age-related nuclear cataract, right eye: Secondary | ICD-10-CM | POA: Diagnosis not present

## 2020-11-23 DIAGNOSIS — H25011 Cortical age-related cataract, right eye: Secondary | ICD-10-CM | POA: Diagnosis not present

## 2020-12-06 ENCOUNTER — Other Ambulatory Visit: Payer: Self-pay

## 2020-12-06 ENCOUNTER — Encounter: Payer: Self-pay | Admitting: Podiatry

## 2020-12-06 ENCOUNTER — Ambulatory Visit (INDEPENDENT_AMBULATORY_CARE_PROVIDER_SITE_OTHER): Payer: Medicare Other | Admitting: Podiatry

## 2020-12-06 DIAGNOSIS — M79676 Pain in unspecified toe(s): Secondary | ICD-10-CM | POA: Diagnosis not present

## 2020-12-06 DIAGNOSIS — B351 Tinea unguium: Secondary | ICD-10-CM

## 2020-12-06 DIAGNOSIS — E1151 Type 2 diabetes mellitus with diabetic peripheral angiopathy without gangrene: Secondary | ICD-10-CM

## 2020-12-06 NOTE — Progress Notes (Signed)
This patient returns to my office for at risk foot care.  This patient requires this care by a professional since this patient will be at risk due to having diabetes.   This patient is unable to cut nails herself since the patient cannot reach her nails.  These nails is painful walking and wearing her shoes. . This patient presents for at risk foot care today.  General Appearance  Alert, conversant and in no acute stress.  Vascular  Dorsalis pedis  pulses are palpable  Bilaterally. Posterior tibial pulses are absent  B/L.  Capillary return is within normal limits  bilaterally. Temperature is within normal limits  Bilaterally  .Absent digital hair.  Neurologic  Senn-Weinstein monofilament wire test within normal limits  bilaterally. Muscle power within normal limits bilaterally.  Nails Thick disfigured discolored nails with subungual debris  from hallux to fifth toes bilaterally. No evidence of bacterial infection or drainage bilaterally.  Orthopedic  No limitations of motion  feet .  No crepitus or effusions noted.  No bony pathology or digital deformities noted.  midfoot arthritis  B/L.  Skin  normotropic skin with no porokeratosis noted bilaterally.  No signs of infections or ulcers noted.   Porokeratosis sub 5th met right foot. Asymptomatic. Asymptomatic  HD 5th toe left.  Onychomycosis  Pain in right toes  Pain in left toes  .  Consent was obtained for treatment procedures.   Mechanical debridement of nails 1-5  bilaterally performed with a nail nipper.  Filed with dremel without incident.    Return office visit    3 months                  Told patient to return for periodic foot care and evaluation due to potential at risk complications.   Donna Snooks DPM  

## 2020-12-15 ENCOUNTER — Telehealth: Payer: Self-pay

## 2020-12-15 NOTE — Telephone Encounter (Signed)
Pt called regarding her Anastrozole side effects.  Pt stated she cannot continue to tolerate the constant Hot Flashes and fatigue.  Pt stated she's not sure if she wants to continue to take the Anastrozole because the side effects are interrupting her quality of life.  I recommend that the pt start taking Soy and Vitamin B12 Supplements for the Hot Flashes & fatigue.  Will consult w/Dr. Burr Medico to see what she recommends for this pt.

## 2020-12-19 ENCOUNTER — Encounter: Payer: Self-pay | Admitting: Pharmacist

## 2020-12-19 NOTE — Progress Notes (Unsigned)
CARE PLAN ENTRY  12/19/2020 Name: Cindy Wong MRN: [id number] DOB: [birth year]  Cindy Wong is enrolled in Remote Patient Monitoring/Principle Care Monitoring.  Date of Enrollment: 06/04/19 Supervising physician: Vernell Leep Indication: HTN  Remote Readings: Compliant and Avg BP: 142/73, HR:71  Next scheduled OV: 04/27/21  Pharmacist Clinical Goal(s):  Over the next 90 days, patient will demonstrate Improved medication adherence as evidenced by medication fill history Over the next 90 days, patient will demonstrate improved understanding of prescribed medications and rationale for usage as evidenced by patient teach back Over the next 90 days, patient will experience decrease in ED visits. ED visits in last 6 months = 0 Over the next 90 days, patient will not experience hospital admission. Hospital Admissions in last 6 months = 0  Interventions: Provider and Inter-disciplinary care team collaboration (see longitudinal plan of care) Comprehensive medication review performed. Discussed plans with patient for ongoing care management follow up and provided patient with direct contact information for care management team Collaboration with provider re: medication management  Patient Self Care Activities:  Self administers medications as prescribed Attends all scheduled provider appointments Performs ADL's independently Performs IADL's independently  Allergies  Allergen Reactions   Sulfamethoxazole Rash   Sulfonamide Derivatives Hives and Itching   Outpatient Encounter Medications as of 12/19/2020  Medication Sig   amLODipine (NORVASC) 5 MG tablet Take 5 mg by mouth daily. In the morning   anastrozole (ARIMIDEX) 1 MG tablet TAKE 1 TABLET(1 MG) BY MOUTH DAILY   carvedilol (COREG) 6.25 MG tablet TAKE 1 TABLET(6.25 MG) BY MOUTH TWICE DAILY   Cholecalciferol (D2000 ULTRA STRENGTH) 50 MCG (2000 UT) CAPS Take by mouth daily.   gabapentin (NEURONTIN) 100 MG capsule  Take 1 capsule (100 mg total) by mouth at bedtime.   glipiZIDE (GLUCOTROL XL) 10 MG 24 hr tablet Take 10 mg by mouth daily.   hydrochlorothiazide (HYDRODIURIL) 25 MG tablet Take 25 mg by mouth daily. In the morning   linaGLIPtin-metFORMIN HCl 2.5-500 MG TABS Take 1 tablet by mouth 2 (two) times daily.    lisinopril (PRINIVIL,ZESTRIL) 40 MG tablet Take 40 mg by mouth daily. In the morning   Multiple Vitamins-Minerals (CENTRUM SILVER PO) Take 1 tablet by mouth daily.    ONETOUCH VERIO test strip 2 (two) times daily. for testing   potassium chloride SA (K-DUR,KLOR-CON) 20 MEQ tablet Take 20 mEq by mouth daily.   pravastatin (PRAVACHOL) 40 MG tablet Take 40 mg by mouth every evening.   ROYAL JELLY PO Take by mouth daily.   No facility-administered encounter medications on file as of 12/19/2020.    Hypertension   BP goal is:  <130/80  Office blood pressures are  BP Readings from Last 3 Encounters:  10/06/20 (!) 133/59  09/15/20 138/64  07/21/20 (!) 141/71    Patient is currently controlled on the following medications: varvedilol 6.25 mg BID, HCTZ 25 mg, lisinopril 40 mg, amlodipine 5 mg  Patient checks BP at home daily  Patient home BP readings are ranging: 122-180/62-100  We discussed diet and exercise extensively  Plan  Continue current medications and control with diet and exercise    ______________ Visit Information SDOH (Social Determinants of Health) assessments performed: Yes.  Cindy Wong was given information about Principle Care Management/Remote Patient Monitoring services today including:  RPM/PCM service includes personalized support from designated clinical staff supervised by her physician, including individualized plan of care and coordination with other care providers 24/7 contact phone numbers for assistance for urgent  and routine care needs. Standard insurance, coinsurance, copays and deductibles apply for principle care management only during months in which  we provide at least 30 minutes of these services. Most insurances cover these services at 100%, however patients may be responsible for any copay, coinsurance and/or deductible if applicable. This service may help you avoid the need for more expensive face-to-face services. Only one practitioner may furnish and bill the service in a calendar month. The patient may stop PCM/RPM services at any time (effective at the end of the month) by phone call to the office staff.  Patient agreed to services and verbal consent obtained.   Cindy Wong, Pharm.D. [facility] [phone] Ext: [phone]

## 2020-12-21 DIAGNOSIS — H2512 Age-related nuclear cataract, left eye: Secondary | ICD-10-CM | POA: Diagnosis not present

## 2020-12-22 ENCOUNTER — Telehealth: Payer: Self-pay

## 2020-12-22 NOTE — Telephone Encounter (Signed)
Spoke with pt via telephone regarding Dr. Ernestina Penna recommendation to stop taking the Anastrozole for now but will further discuss treatment options in the pt's next appt with Dr. Burr Medico on 01/05/2021.  Pt verbalized understanding of Dr. Ernestina Penna suggestion but decided to keep taking the Anastrozole for now until 01/05/2021.

## 2020-12-24 DIAGNOSIS — I10 Essential (primary) hypertension: Secondary | ICD-10-CM | POA: Diagnosis not present

## 2021-01-03 ENCOUNTER — Other Ambulatory Visit: Payer: Self-pay

## 2021-01-03 DIAGNOSIS — I493 Ventricular premature depolarization: Secondary | ICD-10-CM

## 2021-01-03 MED ORDER — CARVEDILOL 6.25 MG PO TABS: ORAL_TABLET | ORAL | 1 refills | Status: DC

## 2021-01-05 ENCOUNTER — Inpatient Hospital Stay: Payer: Medicare Other | Attending: Hematology | Admitting: Hematology

## 2021-01-05 ENCOUNTER — Inpatient Hospital Stay: Payer: Medicare Other

## 2021-01-05 ENCOUNTER — Other Ambulatory Visit: Payer: Self-pay

## 2021-01-05 ENCOUNTER — Encounter: Payer: Self-pay | Admitting: Hematology

## 2021-01-05 VITALS — BP 205/70 | HR 71 | Temp 97.2°F | Resp 17 | Wt 153.5 lb

## 2021-01-05 DIAGNOSIS — N6489 Other specified disorders of breast: Secondary | ICD-10-CM | POA: Insufficient documentation

## 2021-01-05 DIAGNOSIS — Z170 Estrogen receptor positive status [ER+]: Secondary | ICD-10-CM | POA: Diagnosis not present

## 2021-01-05 DIAGNOSIS — Z8509 Personal history of malignant neoplasm of other digestive organs: Secondary | ICD-10-CM | POA: Diagnosis not present

## 2021-01-05 DIAGNOSIS — C49A2 Gastrointestinal stromal tumor of stomach: Secondary | ICD-10-CM

## 2021-01-05 DIAGNOSIS — R232 Flushing: Secondary | ICD-10-CM | POA: Insufficient documentation

## 2021-01-05 DIAGNOSIS — Z923 Personal history of irradiation: Secondary | ICD-10-CM | POA: Insufficient documentation

## 2021-01-05 DIAGNOSIS — Z9049 Acquired absence of other specified parts of digestive tract: Secondary | ICD-10-CM | POA: Insufficient documentation

## 2021-01-05 DIAGNOSIS — Z85028 Personal history of other malignant neoplasm of stomach: Secondary | ICD-10-CM | POA: Diagnosis not present

## 2021-01-05 DIAGNOSIS — Z882 Allergy status to sulfonamides status: Secondary | ICD-10-CM | POA: Insufficient documentation

## 2021-01-05 DIAGNOSIS — C50112 Malignant neoplasm of central portion of left female breast: Secondary | ICD-10-CM | POA: Insufficient documentation

## 2021-01-05 DIAGNOSIS — C50912 Malignant neoplasm of unspecified site of left female breast: Secondary | ICD-10-CM

## 2021-01-05 DIAGNOSIS — G4700 Insomnia, unspecified: Secondary | ICD-10-CM | POA: Insufficient documentation

## 2021-01-05 DIAGNOSIS — Z90721 Acquired absence of ovaries, unilateral: Secondary | ICD-10-CM | POA: Insufficient documentation

## 2021-01-05 DIAGNOSIS — Z23 Encounter for immunization: Secondary | ICD-10-CM | POA: Diagnosis not present

## 2021-01-05 DIAGNOSIS — R5383 Other fatigue: Secondary | ICD-10-CM | POA: Insufficient documentation

## 2021-01-05 DIAGNOSIS — Z86000 Personal history of in-situ neoplasm of breast: Secondary | ICD-10-CM | POA: Diagnosis not present

## 2021-01-05 DIAGNOSIS — I10 Essential (primary) hypertension: Secondary | ICD-10-CM | POA: Insufficient documentation

## 2021-01-05 DIAGNOSIS — Z903 Acquired absence of stomach [part of]: Secondary | ICD-10-CM | POA: Insufficient documentation

## 2021-01-05 DIAGNOSIS — Z9013 Acquired absence of bilateral breasts and nipples: Secondary | ICD-10-CM | POA: Diagnosis not present

## 2021-01-05 DIAGNOSIS — Z79899 Other long term (current) drug therapy: Secondary | ICD-10-CM | POA: Insufficient documentation

## 2021-01-05 LAB — CBC WITH DIFFERENTIAL/PLATELET
Abs Immature Granulocytes: 0.06 10*3/uL (ref 0.00–0.07)
Basophils Absolute: 0.1 10*3/uL (ref 0.0–0.1)
Basophils Relative: 1 %
Eosinophils Absolute: 0.1 10*3/uL (ref 0.0–0.5)
Eosinophils Relative: 1 %
HCT: 43.7 % (ref 36.0–46.0)
Hemoglobin: 14 g/dL (ref 12.0–15.0)
Immature Granulocytes: 1 %
Lymphocytes Relative: 26 %
Lymphs Abs: 2.2 10*3/uL (ref 0.7–4.0)
MCH: 27 pg (ref 26.0–34.0)
MCHC: 32 g/dL (ref 30.0–36.0)
MCV: 84.4 fL (ref 80.0–100.0)
Monocytes Absolute: 0.6 10*3/uL (ref 0.1–1.0)
Monocytes Relative: 7 %
Neutro Abs: 5.4 10*3/uL (ref 1.7–7.7)
Neutrophils Relative %: 64 %
Platelets: 281 10*3/uL (ref 150–400)
RBC: 5.18 MIL/uL — ABNORMAL HIGH (ref 3.87–5.11)
RDW: 13.1 % (ref 11.5–15.5)
WBC: 8.4 10*3/uL (ref 4.0–10.5)
nRBC: 0 % (ref 0.0–0.2)

## 2021-01-05 LAB — COMPREHENSIVE METABOLIC PANEL
ALT: 12 U/L (ref 0–44)
AST: 17 U/L (ref 15–41)
Albumin: 3.9 g/dL (ref 3.5–5.0)
Alkaline Phosphatase: 55 U/L (ref 38–126)
Anion gap: 9 (ref 5–15)
BUN: 15 mg/dL (ref 8–23)
CO2: 28 mmol/L (ref 22–32)
Calcium: 9.8 mg/dL (ref 8.9–10.3)
Chloride: 104 mmol/L (ref 98–111)
Creatinine, Ser: 0.88 mg/dL (ref 0.44–1.00)
GFR, Estimated: >60 mL/min (ref >60)
Glucose, Bld: 159 mg/dL — ABNORMAL HIGH (ref 70–99)
Potassium: 4.1 mmol/L (ref 3.5–5.1)
Sodium: 141 mmol/L (ref 135–145)
Total Bilirubin: 0.4 mg/dL (ref 0.3–1.2)
Total Protein: 7 g/dL (ref 6.5–8.1)

## 2021-01-05 NOTE — Progress Notes (Signed)
[place]   [phone] [phone]   Clinic Follow up Note   Patient Care Team: Tisovec, Cindy Him, MD as PCP - Cindy Wong, Cindy Blanch, RN as Oncology Nurse Navigator Cindy Germany, RN as Oncology Nurse Navigator Cindy Merle, MD as Consulting Physician (Hematology) Cindy Feeling, NP as Nurse Practitioner (Nurse Practitioner) Cindy Mesa, MD as Consulting Physician (General Surgery)  Date of Service:  01/05/2021  CHIEF COMPLAINT: f/u of left breast cancer, h/o right DCIS, and h/o GIST  CURRENT THERAPY:  Surveillance  ASSESSMENT & PLAN:  Cindy Wong is a 87 y.o. female with   1.  Malignant neoplasm of the left breast, grade 2, ER 95% strongly positive, PR 95% strongly positive, HER-2 negative (1+) Ki-67 10%, cT2 N0 M0 stage Ib -She was diagnosed in 10/2019 after screening mammogram. Biopsy showed invasive ductal carcinoma.  -She underwent left mastectomy by Dr Cindy Wong on 12/08/19. Her surgical path showed grade II IDC, 2.8 cm, with clear margins.  -she does not requires post-mastectomy Radiation.  -She began anastrozole on 07/07/20. She does not feel she is tolerating this well, as she feels sluggish and unable to do all her normal activities. We will discontinue; given her age, I am comfortable with this. -For surveillance, she will continue annual self exams, and a routine office visit with lab and exam with [place].  -we will see her back in 6 months   2. Hot flashes, insomnia, fatigue -secondary to/worsening on anastrozole -she does not sleep well at night at baseline but feel it's worse with anastrozole. -we will discontinue the anastrozole.   3. History of recurrent right breast DCIS  -Diagnosed initially in 1993, s/p lumpectomy and adjuvant radiation. She reportedly tried antiestrogen therapy but did not tolerate well. -She had a recurrence of ER/PR positive DCIS in 07/2005, s/p right mastectomy on 08/24/2005   4. H/O  GIST -Diagnosed in 1997, s/p partial gastrectomy, followed by Dr. Ardis Wong  -no clinical signs of recurrence    5. Bone health  -Most recent DEXA was done at Dr. Loren Wong office on 03/25/20. T-score was -0.7.   6. Genetics  -She has personal h/o recurrent DCIS, GIST and left breast cancer. She qualifies for genetics.  -Sister passed from cancer, unknown type, brother with metastatic prostate cancer. Does not have children, has sibling and nieces  -She was previously referred to Genetics, but will think about proceeding.    7. Social Support  -Sister passed in June from cancer, she was depressed for 3-4 months in 2021 but better recently  -Brother with metastatic prostate cancer on recent workup. -She lives alone but has brother who lives in town.      PLAN: -stop anastrozole due to poor tolerance  -labs and f/u in 6 months for cancer surveillance    No problem-specific Assessment & Plan notes found for this encounter.   SUMMARY OF ONCOLOGIC HISTORY: Oncology History Overview Note  Cancer Staging Malignant neoplasm of central left breast [facility]) Staging form: Breast, AJCC 8th Edition - Clinical stage from 11/19/2019: Stage IB (cT2, cN0, cM0, G2, ER+, PR+, HER2-) - Signed by Cindy Feeling, NP on 11/23/2019 - Pathologic stage from 12/08/2019: Stage Unknown (pT2, pNX, cM0, G2, ER+, PR+, HER2-) - Signed by Cindy Merle, MD on 12/25/2019    Malignant neoplasm of central left breast ([place])  11/09/2019 Breast US   IMPRESSION: 1. Highly suspicious left breast mass at the 12 o'clock position 4 cm from the nipple. Recommendation is  for ultrasound-guided biopsy. 2. Indeterminate hyperechoic mass at the 12 o'clock position 4 cm from the nipple, just superior to the index lesion. This may correspond with an additional asymmetry seen mammographically. Recommendation is for ultrasound-guided biopsy with close attention on post clip films to ensure mammographic correlation. 3. No suspicious left  axillary lymphadenopathy. 4. No suspicious findings at the site of the right mastectomy bed palpable lump.   11/19/2019 Cancer Staging   Staging form: Breast, AJCC 8th Edition - Clinical stage from 11/19/2019: Stage IB (cT2, cN0, cM0, G2, ER+, PR+, HER2-) - Signed by Cindy Feeling, NP on 11/23/2019    11/19/2019 Initial Biopsy   Diagnosis Breast, left, needle core biopsy, 12 o'clock - INVASIVE MAMMARY CARCINOMA - SEE COMMENT E-cadherin is POSITIVE supporting a ductal origin. PROGNOSTIC INDICATORS Results: IMMUNOHISTOCHEMICAL AND MORPHOMETRIC ANALYSIS PERFORMED MANUALLY The tumor cells are NEGATIVE for Her2 (1+). Estrogen Receptor: 95%, POSITIVE, STRONG STAINING INTENSITY Progesterone Receptor: 95%, POSITIVE, STRONG STAINING INTENSITY Proliferation Marker Ki67: 10%   11/23/2019 Initial Diagnosis   Malignant neoplasm of left breast ([place])   11/29/2019 Breast MRI   IMPRESSION: 1. 3.1 cm biopsy-proven malignancy within the UPPER LEFT breast. 2. Indeterminate 0.9 cm posterior central/LOWER LEFT breast mass. If breast conservation is desired, 2nd-look ultrasound and possible biopsy is recommended. If this mass is not identified sonographically, than MR guided biopsy would be recommended. 3. No abnormal appearing lymph nodes. 4. RIGHT mastectomy without suspicious abnormalities in the RIGHT mastectomy bed.   12/08/2019 Cancer Staging   Staging form: Breast, AJCC 8th Edition - Pathologic stage from 12/08/2019: Stage Unknown (pT2, pNX, cM0, G2, ER+, PR+, HER2-) - Signed by Cindy Merle, MD on 12/25/2019    12/08/2019 Surgery   Left Mastectomy by Dr Georgette Dover   12/08/2019 Pathology Results   FINAL MICROSCOPIC DIAGNOSIS:   A. BREAST, LEFT, MASTECTOMY:  - Invasive ductal carcinoma, multifocal, 2.8 cm in greatest dimension,  [place] grade 2 of 3.  - Margins of resection are not involved.  - See oncology table.  - See oncology table.    03/2020 -  Anti-estrogen oral therapy    Anastrozole 68m once daily starting 01/2020   03/22/2020 Survivorship   SCP delivered by LCira Rue NP       INTERVAL HISTORY:  BJanellie Wong here for a follow up of breast cancer. She was last seen by me on 10/06/20. She presents to the clinic alone. Her BP is high today; she notes she did not take her medicine this morning. She reports she is "not functioning like I used to... I'm sluggish." She notes she is unsure if this is caused by the anastrozole.   All other systems were reviewed with the patient and are negative.  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    SURGICAL HISTORY: Past Surgical History:  Procedure Laterality Date   ABDOMINAL HYSTERECTOMY  1974   Supracervical   BREAST LUMPECTOMY Right 1993   BREAST SURGERY  Mastectomy   CHOLECYSTECTOMY     COLONOSCOPY     MASTECTOMY Right    MASTECTOMY, PARTIAL Left 12/08/2019   Procedure: LEFT MASTECTOMY;  Surgeon: Cindy Mesa, MD;  Location: [facility];  Service: General;  Laterality: Left;  LMA AND PECTORAL BLOCK   meningitis     OOPHORECTOMY     One ovary removed--?side   Stomach tumor     UPPER GASTROINTESTINAL ENDOSCOPY      I have reviewed the social history and family history with the patient and they are unchanged from previous note.  ALLERGIES:  is allergic to sulfamethoxazole and sulfonamide derivatives.  MEDICATIONS:  Current Outpatient Medications  Medication Sig Dispense Refill   amLODipine (NORVASC) 5 MG tablet Take 5 mg by mouth daily. In the morning      carvedilol (COREG) 6.25 MG tablet TAKE 1 TABLET(6.25 MG) BY MOUTH TWICE DAILY 180 tablet 1   Cholecalciferol (D2000 ULTRA STRENGTH) 50 MCG (2000 UT) CAPS Take by mouth daily.     glipiZIDE (GLUCOTROL XL) 10 MG 24 hr tablet Take 10 mg by mouth daily.     hydrochlorothiazide (HYDRODIURIL) 25 MG tablet Take 25 mg by mouth daily. In the morning  10   linaGLIPtin-metFORMIN HCl 2.5-500 MG TABS Take 1 tablet by mouth 2 (two) times daily.      lisinopril (PRINIVIL,ZESTRIL) 40 MG tablet Take 40 mg by mouth daily. In the morning  0   Multiple Vitamins-Minerals (CENTRUM SILVER PO) Take 1 tablet by mouth daily.      ONETOUCH VERIO test strip 2 (two) times daily. for testing  2   potassium chloride SA (K-DUR,KLOR-CON) 20 MEQ tablet Take 20 mEq by mouth daily.     pravastatin (PRAVACHOL) 40 MG tablet Take 40 mg by mouth every evening.     ROYAL JELLY PO Take by mouth daily.     No current facility-administered medications for this visit.    PHYSICAL EXAMINATION: ECOG PERFORMANCE STATUS: 1 - Symptomatic but completely ambulatory  Vitals:   01/05/21 0957  BP: (!) 205/70  Pulse: 71  Resp: 17  Temp: (!) 97.2 F (36.2 C)  SpO2: 99%   Wt Readings from Last 3 Encounters:  01/05/21 153 lb 8 oz (69.6 kg)  10/06/20 149 lb 8 oz (67.8 kg)  09/15/20 144 lb (65.3 kg)     GENERAL:alert, no distress and comfortable SKIN: skin color, texture, turgor are normal, no rashes or significant lesions, (+) moles present on chest EYES: normal, Conjunctiva are pink and non-injected, sclera clear  NECK: supple, thyroid normal size, non-tender, without nodularity LYMPH:  no palpable lymphadenopathy in the cervical, axillary  LUNGS: clear to auscultation and percussion with normal breathing effort HEART: regular rate & rhythm and no murmurs and no lower extremity edema ABDOMEN:abdomen soft, non-tender and normal bowel sounds Musculoskeletal:no cyanosis of digits and no clubbing  NEURO: alert & oriented x 3 with  fluent speech, no focal motor/sensory deficits BREAST: No palpable mass, nodules or adenopathy bilaterally. Breast exam benign.   LABORATORY DATA:  I have reviewed the data as listed CBC Latest Ref Rng & Units 01/05/2021 07/07/2020 11/23/2019  WBC 4.0 - 10.5 K/uL 8.4 8.3 7.9  Hemoglobin 12.0 - 15.0 g/dL 14.0 13.7 14.8  Hematocrit 36.0 - 46.0 % 43.7 43.4 47.1(H)  Platelets 150 - 400 K/uL 281 267 277     CMP Latest Ref Rng & Units 01/05/2021 07/07/2020 12/04/2019  Glucose 70 - 99 mg/dL 159(H) 106(H) 114(H)  BUN 8 -  23 mg/dL _0 Creatinine 0.44 - 1.00 mg/dL 0.88 0.85 1.02(H)  Sodium 135 - 145 mmol/L 141 142 140  Potassium 3.5 - 5.1 mmol/L 4.1 3.8 4.2  Chloride 98 - 111 mmol/L 104 103 105  CO2 22 - 32 mmol/L _1 Calcium 8.9 - 10.3 mg/dL 9.8 9.8 9.9  Total Protein 6.5 - 8.1 g/dL 7.0 7.0 -  Total Bilirubin 0.3 - 1.2 mg/dL 0.4 0.5 -  Alkaline Phos 38 - 126 U/L 55 50 -  AST 15 - 41 U/L 17 21 -  ALT 0 - 44 U/L 12 10 -      RADIOGRAPHIC STUDIES: I have personally reviewed the radiological images as listed and agreed with the findings in the report. No results found.    No orders of the defined types were placed in this encounter.  All questions were answered. The patient knows to call the clinic with any problems, questions or concerns. No barriers to learning was detected. The total time spent in the appointment was 25 minutes.     Cindy Merle, MD 01/05/2021   I, Wilburn Mylar, am acting as scribe for Cindy Merle, MD.   I have reviewed the above documentation for accuracy and completeness, and I agree with the above.

## 2021-01-23 DIAGNOSIS — I10 Essential (primary) hypertension: Secondary | ICD-10-CM | POA: Diagnosis not present

## 2021-02-15 ENCOUNTER — Other Ambulatory Visit: Payer: Self-pay | Admitting: Hematology

## 2021-02-23 DIAGNOSIS — I10 Essential (primary) hypertension: Secondary | ICD-10-CM | POA: Diagnosis not present

## 2021-03-14 ENCOUNTER — Other Ambulatory Visit: Payer: Self-pay

## 2021-03-14 ENCOUNTER — Encounter: Payer: Self-pay | Admitting: Podiatry

## 2021-03-14 ENCOUNTER — Ambulatory Visit (INDEPENDENT_AMBULATORY_CARE_PROVIDER_SITE_OTHER): Payer: Medicare Other | Admitting: Podiatry

## 2021-03-14 DIAGNOSIS — B351 Tinea unguium: Secondary | ICD-10-CM | POA: Diagnosis not present

## 2021-03-14 DIAGNOSIS — M79676 Pain in unspecified toe(s): Secondary | ICD-10-CM | POA: Diagnosis not present

## 2021-03-14 DIAGNOSIS — E1151 Type 2 diabetes mellitus with diabetic peripheral angiopathy without gangrene: Secondary | ICD-10-CM

## 2021-03-14 NOTE — Progress Notes (Signed)
This patient returns to my office for at risk foot care.  This patient requires this care by a professional since this patient will be at risk due to having diabetes.   This patient is unable to cut nails herself since the patient cannot reach her nails.  These nails is painful walking and wearing her shoes. . This patient presents for at risk foot care today.  General Appearance  Alert, conversant and in no acute stress.  Vascular  Dorsalis pedis  pulses are palpable  Bilaterally. Posterior tibial pulses are absent  B/L.  Capillary return is within normal limits  bilaterally. Temperature is within normal limits  Bilaterally  .Absent digital hair.  Neurologic  Senn-Weinstein monofilament wire test within normal limits  bilaterally. Muscle power within normal limits bilaterally.  Nails Thick disfigured discolored nails with subungual debris  from hallux to fifth toes bilaterally. No evidence of bacterial infection or drainage bilaterally.  Orthopedic  No limitations of motion  feet .  No crepitus or effusions noted.  No bony pathology or digital deformities noted.  midfoot arthritis  B/L.  Skin  normotropic skin with no porokeratosis noted bilaterally.  No signs of infections or ulcers noted.   Porokeratosis sub 5th met right foot. Asymptomatic. Asymptomatic  HD 5th toe left.  Onychomycosis  Pain in right toes  Pain in left toes  .  Consent was obtained for treatment procedures.   Mechanical debridement of nails 1-5  bilaterally performed with a nail nipper.  Filed with dremel without incident.    Return office visit    3 months                  Told patient to return for periodic foot care and evaluation due to potential at risk complications.   Adrina Armijo DPM  

## 2021-03-26 DIAGNOSIS — I10 Essential (primary) hypertension: Secondary | ICD-10-CM | POA: Diagnosis not present

## 2021-03-28 DIAGNOSIS — H9191 Unspecified hearing loss, right ear: Secondary | ICD-10-CM | POA: Diagnosis not present

## 2021-03-28 DIAGNOSIS — Z1331 Encounter for screening for depression: Secondary | ICD-10-CM | POA: Diagnosis not present

## 2021-03-28 DIAGNOSIS — I10 Essential (primary) hypertension: Secondary | ICD-10-CM | POA: Diagnosis not present

## 2021-03-28 DIAGNOSIS — D692 Other nonthrombocytopenic purpura: Secondary | ICD-10-CM | POA: Diagnosis not present

## 2021-03-28 DIAGNOSIS — Z1339 Encounter for screening examination for other mental health and behavioral disorders: Secondary | ICD-10-CM | POA: Diagnosis not present

## 2021-03-28 DIAGNOSIS — E7800 Pure hypercholesterolemia, unspecified: Secondary | ICD-10-CM | POA: Diagnosis not present

## 2021-03-28 DIAGNOSIS — E119 Type 2 diabetes mellitus without complications: Secondary | ICD-10-CM | POA: Diagnosis not present

## 2021-03-28 DIAGNOSIS — Z853 Personal history of malignant neoplasm of breast: Secondary | ICD-10-CM | POA: Diagnosis not present

## 2021-03-28 DIAGNOSIS — Z9010 Acquired absence of unspecified breast and nipple: Secondary | ICD-10-CM | POA: Diagnosis not present

## 2021-03-28 DIAGNOSIS — F411 Generalized anxiety disorder: Secondary | ICD-10-CM | POA: Diagnosis not present

## 2021-03-28 DIAGNOSIS — C50919 Malignant neoplasm of unspecified site of unspecified female breast: Secondary | ICD-10-CM | POA: Diagnosis not present

## 2021-03-28 DIAGNOSIS — F331 Major depressive disorder, recurrent, moderate: Secondary | ICD-10-CM | POA: Diagnosis not present

## 2021-04-03 DIAGNOSIS — C50912 Malignant neoplasm of unspecified site of left female breast: Secondary | ICD-10-CM | POA: Diagnosis not present

## 2021-04-25 DIAGNOSIS — I10 Essential (primary) hypertension: Secondary | ICD-10-CM | POA: Diagnosis not present

## 2021-04-26 NOTE — Progress Notes (Signed)
Follow up visit  Subjective:   Cindy Wong, female    DOB: 1931/09/10, 88 y.o.   MRN: [id number]   Chief Complaint  Patient presents with   Leg Swelling   Hypertension   Follow-up     HPI  88 y.o. African-American female with controlled hypertension, type 2 diabetes mellitus, hyperlipidemia, seen for frequent PVCs/ventricular bigeminy, abnormal stress test.    Patient with history of left breast cancer diagnosed 10/2019, underwent left mastectomy 12/08/2019, continues to follow with Dr. Burr Medico.   Patient presents for follow-up, at last office visit she was stable from a cardiovascular standpoint and no changes were made.  She remains enrolled in remote patient monitoring.  Patient's blood pressure is elevated in the office, however it is well controlled on home monitoring.  Continues to have mild bilateral ankle edema which is stable.  Denies chest pain, dyspnea, palpitations.    Current Outpatient Medications on File Prior to Visit  Medication Sig Dispense Refill   amLODipine (NORVASC) 5 MG tablet Take 5 mg by mouth daily. In the morning     carvedilol (COREG) 6.25 MG tablet TAKE 1 TABLET(6.25 MG) BY MOUTH TWICE DAILY 180 tablet 1   Cholecalciferol (D2000 ULTRA STRENGTH) 50 MCG (2000 UT) CAPS Take by mouth daily.     gabapentin (NEURONTIN) 100 MG capsule TAKE 1 CAPSULE(100 MG) BY MOUTH AT BEDTIME 30 capsule 0   glipiZIDE (GLUCOTROL XL) 10 MG 24 hr tablet Take 10 mg by mouth daily.     hydrochlorothiazide (HYDRODIURIL) 25 MG tablet Take 25 mg by mouth daily. In the morning  10   linaGLIPtin-metFORMIN HCl 2.5-500 MG TABS Take 1 tablet by mouth 2 (two) times daily.      lisinopril (PRINIVIL,ZESTRIL) 40 MG tablet Take 40 mg by mouth daily. In the morning  0   Multiple Vitamins-Minerals (CENTRUM SILVER PO) Take 1 tablet by mouth daily.      ONETOUCH VERIO test strip 2 (two) times daily. for testing  2   potassium chloride SA (K-DUR,KLOR-CON) 20 MEQ tablet Take 20 mEq by mouth  daily.     pravastatin (PRAVACHOL) 40 MG tablet Take 40 mg by mouth every evening.     ROYAL JELLY PO Take by mouth daily.     No current facility-administered medications on file prior to visit.    Cardiovascular studies: EKG 04/27/2021:  Sinus rhythm at a rate of 75 bpm.  Normal axis.  Left atrial enlargement.  Poor reflection, cannot exclude anteroseptal infarct old.  Nonspecific T wave abnormality.  Echocardiogram 08/09/2020:  Left ventricle cavity is normal in size and wall thickness. Normal global  wall motion. Normal LV systolic function with EF 63%. Doppler evidence of  grade I (impaired) diastolic dysfunction, normal LAP.  Trace aortic valve stenosis.  Mild mitral leaflet thickening. No significant stenosis or regurgitation.  Mild tricuspid regurgitation.  Mild pulmonic regurgitation.  No significant change compared to previous study on 10/16/2018.  ABI 05/13/2020:  This exam reveals moderately decreased perfusion of the right lower extremity, noted at the anterior tibial and post tibial artery level (ABI 0.79) and moderately decreased perfusion of the left lower extremity, noted at the post tibial artery level (ABI 0.79). There is mildly abnormal biphasic waveform pattern at the level of the ankles.  EKG 04/27/2020: Sinus rhythm 74 bpm Left atrial enlargement.  Nonspecific T wave abnormality Compared to previous EKG's, PVCs now absent  Echocardiogram 10/16/2018: Left ventricle cavity is normal in size. Normal left ventricular wall thickness.  Normal LV systolic function with EF 55%. Normal global wall motion. Diastolic function assessment limited due to frequent PVC's. Calculated EF 55%. Trileaflet aortic valve with mild thickening. Trace aortic stenosis. No regurgitation noted. Mild tricuspid regurgitation. Trace pulmonic regurgitation.  No evidence of pulmonary hypertension.  Lexiscan Myoview stress test 09/29/2018: Lexiscan stress test was performed. Stress EKG is  non-diagnostic, as this is pharmacological stress test. In addition, rest and stress EKG demonstrate sinus rhythm, frequent PVC's, inferolateral nonspecific ST-T changes.  LVEF 63%. SPECT stress and rest images demonstrate very small size, mild intensity, fixed perfusion defect in basal inferoseptal myocardium. While the defect is small, TID of 1.31 with regional perfusion defect is considered high risk finding. Clinical correlation recommended.   EKG 09/15/2018: Sinus rhythm 77 bpm. Frequent PVC  Carotid US 08/14/2018: Right Carotid: Velocities in the right ICA are consistent with a 1-39% stenosis.                Non-hemodynamically significant plaque <50% noted in the CCA. The                ECA appears <50% stenosed.   Left Carotid: Velocities in the left ICA are consistent with a 1-39% stenosis.               Non-hemodynamically significant plaque noted in the CCA.               Heterogenous multinodular thyroid gland incidentally noted.   Recent labs: CMP Latest Ref Rng & Units 01/05/2021 07/07/2020 12/04/2019  Glucose 70 - 99 mg/dL 159(H) 106(H) 114(H)  BUN 8 - 23 mg/dL '15 14 23  ' Creatinine 0.44 - 1.00 mg/dL 0.88 0.85 1.02(H)  Sodium 135 - 145 mmol/L 141 142 140  Potassium 3.5 - 5.1 mmol/L 4.1 3.8 4.2  Chloride 98 - 111 mmol/L 104 103 105  CO2 22 - 32 mmol/L '28 27 24  ' Calcium 8.9 - 10.3 mg/dL 9.8 9.8 9.9  Total Protein 6.5 - 8.1 g/dL 7.0 7.0 -  Total Bilirubin 0.3 - 1.2 mg/dL 0.4 0.5 -  Alkaline Phos 38 - 126 U/L 55 50 -  AST 15 - 41 U/L 17 21 -  ALT 0 - 44 U/L 12 10 -   CBC Latest Ref Rng & Units 01/05/2021 07/07/2020 11/23/2019  WBC 4.0 - 10.5 K/uL 8.4 8.3 7.9  Hemoglobin 12.0 - 15.0 g/dL 14.0 13.7 14.8  Hematocrit 36.0 - 46.0 % 43.7 43.4 47.1(H)  Platelets 150 - 400 K/uL 281 267 277   Lipid Panel  No results found for: CHOL, TRIG, HDL, CHOLHDL, VLDL, LDLCALC, LDLDIRECT HEMOGLOBIN A1C Lab Results  Component Value Date   HGBA1C 8.1 (H) 02/08/2011   MPG 186 (H)  02/08/2011   TSH No results for input(s): TSH in the last 8760 hours.  07/07/2020: Hemoglobin 13.7, hematocrit 43.4, MCV 84.8, platelet 267 Sodium 142, potassium 3.8, glucose 106, BUN 14, creatinine 0.85, AST 21, ALT 10, alk phos 50, GFR >60  12/04/2019: Glucose 114, BUN/Cr 23/1.02. EGFR 50. Na/K 140/4.2. Rest of the CMP normal H/H 14/47. MCV 83. Platelets 277 HbA1C N/A Lipid panel N/A TSH N/A  11/21/2018: Glucose 101, BUN/Cr 18/0.89. EGFR >60. Na/K 142/4.1. Rest of the CMP normal H/H 15/48. MCV 85. Platelets 268   Review of Systems  Constitutional: Negative for weight gain.  Cardiovascular:  Negative for claudication, leg swelling (mild ankle edema intermittently), near-syncope, orthopnea and paroxysmal nocturnal dyspnea.  Hematologic/Lymphatic: Does not bruise/bleed easily.  Gastrointestinal:  Negative for melena.  Neurological:  Negative for dizziness and weakness.        There were no vitals filed for this visit.    There is no height or weight on file to calculate BMI. There were no vitals filed for this visit.    Objective:   Physical Exam Vitals reviewed.  Constitutional:      General: She is not in acute distress. Cardiovascular:     Rate and Rhythm: Normal rate and regular rhythm.     Pulses: Intact distal pulses.     Heart sounds: S1 normal and S2 normal. No murmur heard.   No gallop.  Pulmonary:     Effort: Pulmonary effort is normal. No respiratory distress.     Breath sounds: No wheezing, rhonchi or rales.  Musculoskeletal:     Right lower leg: No edema.     Left lower leg: No edema.  Neurological:     Mental Status: She is alert.  Physical exam unchanged compared to previous office visit.    Assessment & Recommendations:   88 y.o. African-American female with controlled hypertension, type 2 diabetes mellitus, hyperlipidemia, seen for frequent PVCs/ventricular bigeminy, abnormal stress test.  History of breast cancer status post left mastectomy  11/2019.  Hypertension: Elevated in the office today, however patient is enrolled in remote patient monitoring and her blood pressure is well controlled at home Will not make changes at this time, continue amlodipine, carvedilol, hydrochlorothiazide, lisinopril Continue RPM are office  Bilateral Leg Swelling:  Echocardiogram unchanged with normal LVEF and mild valvular disease. Patient continues to have mild bilateral intermittent ankle edema, advised patient regarding conservative measures and D ASH diet   PAD: ABI revealed mildly reduced perfusion bilaterally to lower extremities.   Patient denies symptoms of claudication.  Advised patient to exercise regularly as tolerated  Follow-up in 6 months, sooner if needed   Alethia Berthold, PA-C 04/27/2021, 11:09 AM Office: [phone]

## 2021-04-27 ENCOUNTER — Ambulatory Visit: Payer: Medicare Other | Admitting: Student

## 2021-04-27 ENCOUNTER — Encounter: Payer: Self-pay | Admitting: Student

## 2021-04-27 ENCOUNTER — Other Ambulatory Visit: Payer: Self-pay

## 2021-04-27 VITALS — BP 160/84 | HR 75

## 2021-04-27 DIAGNOSIS — I739 Peripheral vascular disease, unspecified: Secondary | ICD-10-CM | POA: Diagnosis not present

## 2021-04-27 DIAGNOSIS — I10 Essential (primary) hypertension: Secondary | ICD-10-CM | POA: Diagnosis not present

## 2021-04-27 DIAGNOSIS — R600 Localized edema: Secondary | ICD-10-CM | POA: Diagnosis not present

## 2021-05-26 DIAGNOSIS — I10 Essential (primary) hypertension: Secondary | ICD-10-CM | POA: Diagnosis not present

## 2021-06-13 ENCOUNTER — Encounter: Payer: Self-pay | Admitting: Podiatry

## 2021-06-13 ENCOUNTER — Ambulatory Visit (INDEPENDENT_AMBULATORY_CARE_PROVIDER_SITE_OTHER): Payer: Medicare Other | Admitting: Podiatry

## 2021-06-13 DIAGNOSIS — M79676 Pain in unspecified toe(s): Secondary | ICD-10-CM | POA: Diagnosis not present

## 2021-06-13 DIAGNOSIS — E1151 Type 2 diabetes mellitus with diabetic peripheral angiopathy without gangrene: Secondary | ICD-10-CM

## 2021-06-13 DIAGNOSIS — B351 Tinea unguium: Secondary | ICD-10-CM | POA: Diagnosis not present

## 2021-06-13 NOTE — Progress Notes (Signed)
This patient returns to my office for at risk foot care.  This patient requires this care by a professional since this patient will be at risk due to having diabetes.   This patient is unable to cut nails herself since the patient cannot reach her nails.  These nails is painful walking and wearing her shoes. . This patient presents for at risk foot care today.  General Appearance  Alert, conversant and in no acute stress.  Vascular  Dorsalis pedis  pulses are palpable  Bilaterally. Posterior tibial pulses are absent  B/L.  Capillary return is within normal limits  bilaterally. Temperature is within normal limits  Bilaterally  .Absent digital hair.  Neurologic  Senn-Weinstein monofilament wire test within normal limits  bilaterally. Muscle power within normal limits bilaterally.  Nails Thick disfigured discolored nails with subungual debris  from hallux to fifth toes bilaterally. No evidence of bacterial infection or drainage bilaterally.  Orthopedic  No limitations of motion  feet .  No crepitus or effusions noted.  No bony pathology or digital deformities noted.  midfoot arthritis  B/L.  Skin  normotropic skin with no porokeratosis noted bilaterally.  No signs of infections or ulcers noted.    Asymptomatic. Asymptomatic  HD 5th toe left.  Onychomycosis  Pain in right toes  Pain in left toes  .  Consent was obtained for treatment procedures.   Mechanical debridement of nails 1-5  bilaterally performed with a nail nipper.  Filed with dremel without incident.    Return office visit    3 months                  Told patient to return for periodic foot care and evaluation due to potential at risk complications.   Mishon Blubaugh DPM  

## 2021-06-22 ENCOUNTER — Other Ambulatory Visit: Payer: Self-pay | Admitting: Student

## 2021-06-22 DIAGNOSIS — I493 Ventricular premature depolarization: Secondary | ICD-10-CM

## 2021-06-25 DIAGNOSIS — I10 Essential (primary) hypertension: Secondary | ICD-10-CM | POA: Diagnosis not present

## 2021-06-28 DIAGNOSIS — L219 Seborrheic dermatitis, unspecified: Secondary | ICD-10-CM | POA: Diagnosis not present

## 2021-06-28 DIAGNOSIS — L853 Xerosis cutis: Secondary | ICD-10-CM | POA: Diagnosis not present

## 2021-06-28 DIAGNOSIS — L309 Dermatitis, unspecified: Secondary | ICD-10-CM | POA: Diagnosis not present

## 2021-07-05 NOTE — Progress Notes (Signed)
[facility]   [phone] [phone]   Clinic Follow up Note   Patient Care Team: Tisovec, Fransico Him, MD as PCP - Philomena Doheny, Paulette Blanch, RN as Oncology Nurse Navigator Rockwell Germany, RN as Oncology Nurse Navigator Truitt Merle, MD as Consulting Physician (Hematology) Alla Feeling, NP as Nurse Practitioner (Nurse Practitioner) Donnie Mesa, MD as Consulting Physician (General Surgery) 07/06/2021  CHIEF COMPLAINT: Follow up left breast cancer, h/o right breast DCIS, and h/o GIST  SUMMARY OF ONCOLOGIC HISTORY: Oncology History Overview Note  Cancer Staging Malignant neoplasm of central left breast [facility]) Staging form: Breast, AJCC 8th Edition - Clinical stage from 11/19/2019: Stage IB (cT2, cN0, cM0, G2, ER+, PR+, HER2-) - Signed by Alla Feeling, NP on 11/23/2019 - Pathologic stage from 12/08/2019: Stage Unknown (pT2, pNX, cM0, G2, ER+, PR+, HER2-) - Signed by Truitt Merle, MD on 12/25/2019    Malignant neoplasm of central left breast ([place])  11/09/2019 Breast US   IMPRESSION: 1. Highly suspicious left breast mass at the 12 o'clock position 4 cm from the nipple. Recommendation is for ultrasound-guided biopsy. 2. Indeterminate hyperechoic mass at the 12 o'clock position 4 cm from the nipple, just superior to the index lesion. This may correspond with an additional asymmetry seen mammographically. Recommendation is for ultrasound-guided biopsy with close attention on post clip films to ensure mammographic correlation. 3. No suspicious left axillary lymphadenopathy. 4. No suspicious findings at the site of the right mastectomy bed palpable lump.   11/19/2019 Cancer Staging   Staging form: Breast, AJCC 8th Edition - Clinical stage from 11/19/2019: Stage IB (cT2, cN0, cM0, G2, ER+, PR+, HER2-) - Signed by Alla Feeling, NP on 11/23/2019    11/19/2019 Initial Biopsy   Diagnosis Breast, left, needle core biopsy, 12 o'clock - INVASIVE MAMMARY  CARCINOMA - SEE COMMENT E-cadherin is POSITIVE supporting a ductal origin. PROGNOSTIC INDICATORS Results: IMMUNOHISTOCHEMICAL AND MORPHOMETRIC ANALYSIS PERFORMED MANUALLY The tumor cells are NEGATIVE for Her2 (1+). Estrogen Receptor: 95%, POSITIVE, STRONG STAINING INTENSITY Progesterone Receptor: 95%, POSITIVE, STRONG STAINING INTENSITY Proliferation Marker Ki67: 10%   11/23/2019 Initial Diagnosis   Malignant neoplasm of left breast ([place])    11/29/2019 Breast MRI   IMPRESSION: 1. 3.1 cm biopsy-proven malignancy within the UPPER LEFT breast. 2. Indeterminate 0.9 cm posterior central/LOWER LEFT breast mass. If breast conservation is desired, 2nd-look ultrasound and possible biopsy is recommended. If this mass is not identified sonographically, than MR guided biopsy would be recommended. 3. No abnormal appearing lymph nodes. 4. RIGHT mastectomy without suspicious abnormalities in the RIGHT mastectomy bed.   12/08/2019 Cancer Staging   Staging form: Breast, AJCC 8th Edition - Pathologic stage from 12/08/2019: Stage Unknown (pT2, pNX, cM0, G2, ER+, PR+, HER2-) - Signed by Truitt Merle, MD on 12/25/2019    12/08/2019 Surgery   Left Mastectomy by Dr Georgette Dover   12/08/2019 Pathology Results   FINAL MICROSCOPIC DIAGNOSIS:   A. BREAST, LEFT, MASTECTOMY:  - Invasive ductal carcinoma, multifocal, 2.8 cm in greatest dimension,  Nottingham grade 2 of 3.  - Margins of resection are not involved.  - See oncology table.  - See oncology table.    03/2020 -  Anti-estrogen oral therapy   Anastrozole 36m once daily starting 01/2020   03/22/2020 Survivorship   SCP delivered by LCira Rue NP      CURRENT THERAPY: Surveillance   INTERVAL HISTORY: Ms. WCushmanreturns for follow up as scheduled. Last seen by Dr. FBurr Medico86/17/22 at which time  antiestrogen was discontinued due to poor tolerance. She has been on surveillance alone.  She feels better off antiestrogen therapy.  Although for the past month  she has felt different, a little down, difficult for her to explain but "sort of like depression."  Today she feels well, in good spirits.  She had a rash on her legs that went away when she started moisturizing per PCP.  Her energy level is at baseline, good appetite no unintentional weight loss.  Denies new lump/mass at the mastectomy sites/chest wall, any bone or joint pain, change in bowel habits, abdominal pain/bloating, bleeding, fever, chills, cough, chest pain, dyspnea, leg edema, or any other new specific complaints.    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   partial gastrectomy    SURGICAL HISTORY: Past Surgical History:  Procedure Laterality Date   ABDOMINAL HYSTERECTOMY  1974   Supracervical   BREAST LUMPECTOMY Right 1993   BREAST SURGERY     Mastectomy   CHOLECYSTECTOMY     COLONOSCOPY     MASTECTOMY Right    MASTECTOMY, PARTIAL Left 12/08/2019   Procedure: LEFT MASTECTOMY;  Surgeon: Donnie Mesa, MD;  Location: [place];  Service: General;  Laterality: Left;  LMA AND PECTORAL BLOCK   meningitis     OOPHORECTOMY     One ovary removed--?side   Stomach tumor     UPPER GASTROINTESTINAL ENDOSCOPY      I have reviewed the social history and family history with the patient and they are unchanged from previous note.  ALLERGIES:  is allergic to sulfamethoxazole and sulfonamide derivatives.  MEDICATIONS:  Current Outpatient Medications  Medication Sig Dispense Refill   amLODipine (NORVASC) 5 MG tablet Take 5 mg by mouth daily. In the morning      carvedilol (COREG) 6.25 MG tablet TAKE 1 TABLET(6.25 MG) BY MOUTH TWICE DAILY 180 tablet 1   Cholecalciferol (D2000 ULTRA STRENGTH) 50 MCG (2000 UT) CAPS Take by mouth daily.     gabapentin (NEURONTIN) 100 MG capsule TAKE 1 CAPSULE(100 MG) BY MOUTH AT BEDTIME 30 capsule 0   glipiZIDE (GLUCOTROL XL) 10 MG 24 hr tablet Take 10 mg by mouth daily.     hydrochlorothiazide (HYDRODIURIL) 25 MG tablet Take 25 mg by mouth daily. In the morning  10   linaGLIPtin-metFORMIN HCl 2.5-500 MG TABS Take 1 tablet by mouth 2 (two) times daily.      lisinopril (PRINIVIL,ZESTRIL) 40 MG tablet Take 40 mg by mouth daily. In the morning  0   Multiple Vitamins-Minerals (CENTRUM SILVER PO) Take 1 tablet by mouth daily.      ONETOUCH VERIO test strip 2 (two) times daily. for testing  2   potassium chloride SA (K-DUR,KLOR-CON) 20 MEQ tablet Take 20 mEq by mouth daily.     pravastatin (PRAVACHOL) 40 MG tablet Take 40 mg by mouth every evening.     No current facility-administered medications for this visit.    PHYSICAL EXAMINATION: ECOG PERFORMANCE STATUS:  0 - Asymptomatic  Vitals:   07/06/21 1002  BP: (!) 174/82  Pulse: 76  Resp: 17  Temp: 97.6 F (36.4 C)  SpO2: 100%   Filed Weights   07/06/21 1002  Weight: 151 lb 2 oz (68.5 kg)    GENERAL:alert, no distress and comfortable SKIN: No rash EYES:  sclera clear NECK: Without mass LYMPH:  no palpable cervical or supraclavicular lymphadenopathy LUNGS:  normal breathing effort HEART: no lower extremity edema ABDOMEN:abdomen soft, non-tender and normal bowel sounds NEURO: alert & oriented x 3 with fluent speech, no focal motor deficits Breast exam: S/p bilateral mastectomy, incisions completely healed.  Prominent bones at the chest wall and underlying the right scar.  No palpable mass, nodularity, or rash along the mastectomy scars or chest wall or either axilla that I could appreciate  LABORATORY DATA:  I have reviewed the data as listed    Latest Ref  Rng & Units 07/06/2021    9:35 AM 01/05/2021    9:34 AM 07/07/2020    8:54 AM  CBC  WBC 4.0 - 10.5 K/uL 7.3   8.4   8.3    Hemoglobin 12.0 - 15.0 g/dL 13.8   14.0   13.7    Hematocrit 36.0 - 46.0 % 43.1   43.7   43.4    Platelets 150 - 400 K/uL 272   281   267          Latest Ref Rng & Units 07/06/2021    9:35 AM 01/05/2021    9:34 AM 07/07/2020    8:54 AM  CMP  Glucose 70 - 99 mg/dL 158   159   106    BUN 8 - 23 mg/dL _0 Creatinine 0.44 - 1.00 mg/dL 0.91   0.88   0.85    Sodium 135 - 145 mmol/L 140   141   142    Potassium 3.5 - 5.1 mmol/L 3.9   4.1   3.8    Chloride 98 - 111 mmol/L 104   104   103    CO2 22 - 32 mmol/L _1 Calcium 8.9 - 10.3 mg/dL 9.6   9.8   9.8    Total Protein 6.5 - 8.1 g/dL 7.0   7.0   7.0    Total Bilirubin 0.3 - 1.2 mg/dL 0.5   0.4   0.5    Alkaline Phos 38 - 126 U/L 49   55   50    AST 15 - 41 U/L _2 ALT 0 - 44 U/L _3 RADIOGRAPHIC STUDIES: I have personally reviewed the radiological images as listed and agreed with the findings in the report. No results found.   ASSESSMENT & PLAN: Cindy Wong is a 88 y.o. female with    1.  Malignant neoplasm of the left breast, grade 2, ER 95% strongly positive, PR 95% strongly positive, HER-2 negative (1+) Ki-67 10%, cT2 N0 M0 stage Ib -Diagnosed in 10/2019 after screening mammogram. Biopsy showed invasive ductal carcinoma.  -She underwent left mastectomy by Dr Johnny Bridge on 12/08/19. Her surgical path showed grade II IDC, 2.8 cm, with clear margins.  -Due to negative lymph nodes she did not require postmastectomy radiation.  -She began anastrozole on 07/07/20. She did not feel she tolerated  well, caused sluggishness and difficulty with normal activities.  She stopped this in 12/2020, feels better -She is on surveillance alone, no role for mammograms after bilateral mastectomy -Continue surveillance -Next visit in 6 months, or sooner if needed   2. History  of recurrent right breast DCIS  -Diagnosed initially in 1993, s/p lumpectomy and adjuvant radiation. She reportedly tried antiestrogen therapy but did not tolerate well. -She had a recurrence of ER/PR positive DCIS in 07/2005, s/p right mastectomy on 08/24/2005   3. H/O GIST -Diagnosed in 1997, s/p partial gastrectomy, followed by Dr. Ardis Hughs  -No clinical signs of recurrence   4. Bone health  -Most recent DEXA was done at Dr. Loren Racer office on 03/25/20. T-score was -0.7.   5. Genetics  -She has personal h/o recurrent DCIS, GIST and left breast cancer. She qualifies for genetics.  -Sister passed from cancer, unknown type, brother with metastatic prostate cancer. Does not have children, has sibling and nieces  -She was previously referred to Genetics but has not undergone testing   6. Social Support  -Sister passed in June from cancer, she was depressed for 3-4 months in 2021 but better recently  -Brother with metastatic prostate cancer on recent workup. -She lives alone but has brother who lives in town.       Disposition: Ms. Pamer is clinically doing well on surveillance.  Breast exam is unremarkable, CBC and CMP are normal.  Overall there is no clinical concern for breast cancer or GIST recurrence.  Continue surveillance.  There is no role for mammogram after bilateral mastectomy.  Routine visit in 6 months, or sooner if needed.  We reviewed signs and symptoms of recurrence.  All questions were answered. The patient knows to call the clinic with any problems, questions or concerns. No barriers to learning were detected.      Alla Feeling, NP 07/06/21

## 2021-07-06 ENCOUNTER — Inpatient Hospital Stay (HOSPITAL_BASED_OUTPATIENT_CLINIC_OR_DEPARTMENT_OTHER): Payer: Medicare Other | Admitting: Nurse Practitioner

## 2021-07-06 ENCOUNTER — Inpatient Hospital Stay: Payer: Medicare Other | Attending: Nurse Practitioner

## 2021-07-06 ENCOUNTER — Other Ambulatory Visit: Payer: Self-pay

## 2021-07-06 ENCOUNTER — Encounter: Payer: Self-pay | Admitting: Nurse Practitioner

## 2021-07-06 VITALS — BP 174/82 | HR 76 | Temp 97.6°F | Resp 17 | Wt 151.1 lb

## 2021-07-06 DIAGNOSIS — C50112 Malignant neoplasm of central portion of left female breast: Secondary | ICD-10-CM | POA: Insufficient documentation

## 2021-07-06 DIAGNOSIS — R21 Rash and other nonspecific skin eruption: Secondary | ICD-10-CM | POA: Insufficient documentation

## 2021-07-06 DIAGNOSIS — C50912 Malignant neoplasm of unspecified site of left female breast: Secondary | ICD-10-CM

## 2021-07-06 DIAGNOSIS — Z85028 Personal history of other malignant neoplasm of stomach: Secondary | ICD-10-CM | POA: Insufficient documentation

## 2021-07-06 DIAGNOSIS — Z9049 Acquired absence of other specified parts of digestive tract: Secondary | ICD-10-CM | POA: Diagnosis not present

## 2021-07-06 DIAGNOSIS — I10 Essential (primary) hypertension: Secondary | ICD-10-CM | POA: Diagnosis not present

## 2021-07-06 DIAGNOSIS — Z170 Estrogen receptor positive status [ER+]: Secondary | ICD-10-CM | POA: Diagnosis not present

## 2021-07-06 DIAGNOSIS — Z90721 Acquired absence of ovaries, unilateral: Secondary | ICD-10-CM | POA: Insufficient documentation

## 2021-07-06 DIAGNOSIS — Z8509 Personal history of malignant neoplasm of other digestive organs: Secondary | ICD-10-CM | POA: Diagnosis not present

## 2021-07-06 DIAGNOSIS — N6489 Other specified disorders of breast: Secondary | ICD-10-CM | POA: Insufficient documentation

## 2021-07-06 DIAGNOSIS — Z9013 Acquired absence of bilateral breasts and nipples: Secondary | ICD-10-CM | POA: Insufficient documentation

## 2021-07-06 DIAGNOSIS — Z882 Allergy status to sulfonamides status: Secondary | ICD-10-CM | POA: Diagnosis not present

## 2021-07-06 DIAGNOSIS — Z86000 Personal history of in-situ neoplasm of breast: Secondary | ICD-10-CM

## 2021-07-06 DIAGNOSIS — C49A2 Gastrointestinal stromal tumor of stomach: Secondary | ICD-10-CM

## 2021-07-06 DIAGNOSIS — Z79899 Other long term (current) drug therapy: Secondary | ICD-10-CM | POA: Diagnosis not present

## 2021-07-06 LAB — CBC WITH DIFFERENTIAL/PLATELET
Abs Immature Granulocytes: 0.04 10*3/uL (ref 0.00–0.07)
Basophils Absolute: 0.1 10*3/uL (ref 0.0–0.1)
Basophils Relative: 1 %
Eosinophils Absolute: 0.1 10*3/uL (ref 0.0–0.5)
Eosinophils Relative: 2 %
HCT: 43.1 % (ref 36.0–46.0)
Hemoglobin: 13.8 g/dL (ref 12.0–15.0)
Immature Granulocytes: 1 %
Lymphocytes Relative: 29 %
Lymphs Abs: 2.1 10*3/uL (ref 0.7–4.0)
MCH: 26.7 pg (ref 26.0–34.0)
MCHC: 32 g/dL (ref 30.0–36.0)
MCV: 83.5 fL (ref 80.0–100.0)
Monocytes Absolute: 0.5 10*3/uL (ref 0.1–1.0)
Monocytes Relative: 7 %
Neutro Abs: 4.5 10*3/uL (ref 1.7–7.7)
Neutrophils Relative %: 60 %
Platelets: 272 10*3/uL (ref 150–400)
RBC: 5.16 MIL/uL — ABNORMAL HIGH (ref 3.87–5.11)
RDW: 13.2 % (ref 11.5–15.5)
WBC: 7.3 10*3/uL (ref 4.0–10.5)
nRBC: 0 % (ref 0.0–0.2)

## 2021-07-06 LAB — COMPREHENSIVE METABOLIC PANEL
ALT: 11 U/L (ref 0–44)
AST: 16 U/L (ref 15–41)
Albumin: 4 g/dL (ref 3.5–5.0)
Alkaline Phosphatase: 49 U/L (ref 38–126)
Anion gap: 6 (ref 5–15)
BUN: 15 mg/dL (ref 8–23)
CO2: 30 mmol/L (ref 22–32)
Calcium: 9.6 mg/dL (ref 8.9–10.3)
Chloride: 104 mmol/L (ref 98–111)
Creatinine, Ser: 0.91 mg/dL (ref 0.44–1.00)
GFR, Estimated: >60 mL/min (ref >60)
Glucose, Bld: 158 mg/dL — ABNORMAL HIGH (ref 70–99)
Potassium: 3.9 mmol/L (ref 3.5–5.1)
Sodium: 140 mmol/L (ref 135–145)
Total Bilirubin: 0.5 mg/dL (ref 0.3–1.2)
Total Protein: 7 g/dL (ref 6.5–8.1)

## 2021-07-26 DIAGNOSIS — I10 Essential (primary) hypertension: Secondary | ICD-10-CM | POA: Diagnosis not present

## 2021-07-26 DIAGNOSIS — L298 Other pruritus: Secondary | ICD-10-CM | POA: Diagnosis not present

## 2021-07-26 DIAGNOSIS — D225 Melanocytic nevi of trunk: Secondary | ICD-10-CM | POA: Diagnosis not present

## 2021-07-26 DIAGNOSIS — L908 Other atrophic disorders of skin: Secondary | ICD-10-CM | POA: Diagnosis not present

## 2021-07-26 DIAGNOSIS — L814 Other melanin hyperpigmentation: Secondary | ICD-10-CM | POA: Diagnosis not present

## 2021-07-26 DIAGNOSIS — L853 Xerosis cutis: Secondary | ICD-10-CM | POA: Diagnosis not present

## 2021-07-26 DIAGNOSIS — L821 Other seborrheic keratosis: Secondary | ICD-10-CM | POA: Diagnosis not present

## 2021-08-04 DIAGNOSIS — Z961 Presence of intraocular lens: Secondary | ICD-10-CM | POA: Diagnosis not present

## 2021-08-04 DIAGNOSIS — H04123 Dry eye syndrome of bilateral lacrimal glands: Secondary | ICD-10-CM | POA: Diagnosis not present

## 2021-08-25 DIAGNOSIS — I10 Essential (primary) hypertension: Secondary | ICD-10-CM | POA: Diagnosis not present

## 2021-09-19 ENCOUNTER — Encounter: Payer: Self-pay | Admitting: Podiatry

## 2021-09-19 ENCOUNTER — Ambulatory Visit (INDEPENDENT_AMBULATORY_CARE_PROVIDER_SITE_OTHER): Payer: Medicare Other | Admitting: Podiatry

## 2021-09-19 DIAGNOSIS — E1151 Type 2 diabetes mellitus with diabetic peripheral angiopathy without gangrene: Secondary | ICD-10-CM | POA: Diagnosis not present

## 2021-09-19 DIAGNOSIS — B351 Tinea unguium: Secondary | ICD-10-CM

## 2021-09-19 DIAGNOSIS — M79676 Pain in unspecified toe(s): Secondary | ICD-10-CM

## 2021-09-19 NOTE — Progress Notes (Signed)
This patient returns to my office for at risk foot care.  This patient requires this care by a professional since this patient will be at risk due to having diabetes.   This patient is unable to cut nails herself since the patient cannot reach her nails.  These nails is painful walking and wearing her shoes. . This patient presents for at risk foot care today.  General Appearance  Alert, conversant and in no acute stress.  Vascular  Dorsalis pedis  pulses are palpable  Bilaterally. Posterior tibial pulses are absent  B/L.  Capillary return is within normal limits  bilaterally. Temperature is within normal limits  Bilaterally  .Absent digital hair.  Neurologic  Senn-Weinstein monofilament wire test within normal limits  bilaterally. Muscle power within normal limits bilaterally.  Nails Thick disfigured discolored nails with subungual debris  from hallux to fifth toes bilaterally. No evidence of bacterial infection or drainage bilaterally.  Orthopedic  No limitations of motion  feet .  No crepitus or effusions noted.  No bony pathology or digital deformities noted.  midfoot arthritis  B/L.  Skin  normotropic skin with no porokeratosis noted bilaterally.  No signs of infections or ulcers noted.    Asymptomatic. Asymptomatic  HD 5th toe left.  Onychomycosis  Pain in right toes  Pain in left toes  .  Consent was obtained for treatment procedures.   Mechanical debridement of nails 1-5  bilaterally performed with a nail nipper.  Filed with dremel without incident.    Return office visit    3 months                  Told patient to return for periodic foot care and evaluation due to potential at risk complications.   Gardiner Barefoot DPM

## 2021-09-25 DIAGNOSIS — I10 Essential (primary) hypertension: Secondary | ICD-10-CM | POA: Diagnosis not present

## 2021-09-27 DIAGNOSIS — E7800 Pure hypercholesterolemia, unspecified: Secondary | ICD-10-CM | POA: Diagnosis not present

## 2021-09-27 DIAGNOSIS — E559 Vitamin D deficiency, unspecified: Secondary | ICD-10-CM | POA: Diagnosis not present

## 2021-09-27 DIAGNOSIS — I10 Essential (primary) hypertension: Secondary | ICD-10-CM | POA: Diagnosis not present

## 2021-09-27 DIAGNOSIS — E119 Type 2 diabetes mellitus without complications: Secondary | ICD-10-CM | POA: Diagnosis not present

## 2021-09-27 DIAGNOSIS — F411 Generalized anxiety disorder: Secondary | ICD-10-CM | POA: Diagnosis not present

## 2021-09-27 DIAGNOSIS — R7989 Other specified abnormal findings of blood chemistry: Secondary | ICD-10-CM | POA: Diagnosis not present

## 2021-10-04 DIAGNOSIS — H9191 Unspecified hearing loss, right ear: Secondary | ICD-10-CM | POA: Diagnosis not present

## 2021-10-04 DIAGNOSIS — I10 Essential (primary) hypertension: Secondary | ICD-10-CM | POA: Diagnosis not present

## 2021-10-04 DIAGNOSIS — F331 Major depressive disorder, recurrent, moderate: Secondary | ICD-10-CM | POA: Diagnosis not present

## 2021-10-04 DIAGNOSIS — Z1331 Encounter for screening for depression: Secondary | ICD-10-CM | POA: Diagnosis not present

## 2021-10-04 DIAGNOSIS — Z853 Personal history of malignant neoplasm of breast: Secondary | ICD-10-CM | POA: Diagnosis not present

## 2021-10-04 DIAGNOSIS — Z9010 Acquired absence of unspecified breast and nipple: Secondary | ICD-10-CM | POA: Diagnosis not present

## 2021-10-04 DIAGNOSIS — R8281 Pyuria: Secondary | ICD-10-CM | POA: Diagnosis not present

## 2021-10-04 DIAGNOSIS — Z0000 Encounter for general adult medical examination without abnormal findings: Secondary | ICD-10-CM | POA: Diagnosis not present

## 2021-10-04 DIAGNOSIS — C50919 Malignant neoplasm of unspecified site of unspecified female breast: Secondary | ICD-10-CM | POA: Diagnosis not present

## 2021-10-04 DIAGNOSIS — D692 Other nonthrombocytopenic purpura: Secondary | ICD-10-CM | POA: Diagnosis not present

## 2021-10-04 DIAGNOSIS — Z1339 Encounter for screening examination for other mental health and behavioral disorders: Secondary | ICD-10-CM | POA: Diagnosis not present

## 2021-10-04 DIAGNOSIS — E119 Type 2 diabetes mellitus without complications: Secondary | ICD-10-CM | POA: Diagnosis not present

## 2021-10-04 DIAGNOSIS — F411 Generalized anxiety disorder: Secondary | ICD-10-CM | POA: Diagnosis not present

## 2021-10-16 ENCOUNTER — Emergency Department (HOSPITAL_COMMUNITY): Payer: Medicare Other

## 2021-10-16 ENCOUNTER — Encounter (HOSPITAL_COMMUNITY): Payer: Self-pay | Admitting: Emergency Medicine

## 2021-10-16 ENCOUNTER — Emergency Department (HOSPITAL_COMMUNITY)
Admission: EM | Admit: 2021-10-16 | Discharge: 2021-10-17 | Disposition: A | Discharge status: Home / Self Care | Payer: Medicare Other | Attending: Emergency Medicine | Admitting: Emergency Medicine

## 2021-10-16 DIAGNOSIS — R040 Epistaxis: Secondary | ICD-10-CM | POA: Insufficient documentation

## 2021-10-16 DIAGNOSIS — Z79899 Other long term (current) drug therapy: Secondary | ICD-10-CM | POA: Diagnosis not present

## 2021-10-16 DIAGNOSIS — S199XXA Unspecified injury of neck, initial encounter: Secondary | ICD-10-CM | POA: Diagnosis not present

## 2021-10-16 DIAGNOSIS — S0990XA Unspecified injury of head, initial encounter: Secondary | ICD-10-CM | POA: Insufficient documentation

## 2021-10-16 DIAGNOSIS — W19XXXA Unspecified fall, initial encounter: Secondary | ICD-10-CM | POA: Diagnosis not present

## 2021-10-16 DIAGNOSIS — Z7984 Long term (current) use of oral hypoglycemic drugs: Secondary | ICD-10-CM | POA: Insufficient documentation

## 2021-10-16 DIAGNOSIS — Z743 Need for continuous supervision: Secondary | ICD-10-CM | POA: Diagnosis not present

## 2021-10-16 DIAGNOSIS — E119 Type 2 diabetes mellitus without complications: Secondary | ICD-10-CM | POA: Diagnosis not present

## 2021-10-16 DIAGNOSIS — S01511A Laceration without foreign body of lip, initial encounter: Secondary | ICD-10-CM | POA: Diagnosis not present

## 2021-10-16 DIAGNOSIS — R42 Dizziness and giddiness: Secondary | ICD-10-CM | POA: Diagnosis not present

## 2021-10-16 DIAGNOSIS — S022XXA Fracture of nasal bones, initial encounter for closed fracture: Secondary | ICD-10-CM | POA: Diagnosis not present

## 2021-10-16 DIAGNOSIS — Z23 Encounter for immunization: Secondary | ICD-10-CM | POA: Insufficient documentation

## 2021-10-16 DIAGNOSIS — S00501A Unspecified superficial injury of lip, initial encounter: Secondary | ICD-10-CM | POA: Diagnosis present

## 2021-10-16 DIAGNOSIS — R58 Hemorrhage, not elsewhere classified: Secondary | ICD-10-CM | POA: Diagnosis not present

## 2021-10-16 DIAGNOSIS — S0003XA Contusion of scalp, initial encounter: Secondary | ICD-10-CM | POA: Diagnosis not present

## 2021-10-16 MED ORDER — TETANUS-DIPHTH-ACELL PERTUSSIS 5-2.5-18.5 LF-MCG/0.5 IM SUSY
0.5000 mL | PREFILLED_SYRINGE | Freq: Once | INTRAMUSCULAR | Status: AC
Start: 2021-10-16 — End: 2021-10-16
  Administered 2021-10-16: 0.5 mL via INTRAMUSCULAR
  Filled 2021-10-16: qty 0.5

## 2021-10-16 MED ORDER — LIDOCAINE-EPINEPHRINE-TETRACAINE (LET) TOPICAL GEL
3.0000 mL | Freq: Once | TOPICAL | Status: AC
Start: 2021-10-16 — End: 2021-10-16
  Administered 2021-10-16: 3 mL via TOPICAL
  Filled 2021-10-16: qty 3

## 2021-10-16 MED ORDER — BACITRACIN ZINC 500 UNIT/GM EX OINT
TOPICAL_OINTMENT | Freq: Two times a day (BID) | CUTANEOUS | Status: DC
  Administered 2021-10-16: 1 via TOPICAL
  Filled 2021-10-16: qty 0.9

## 2021-10-16 NOTE — ED Notes (Signed)
Patient transported to CT 

## 2021-10-16 NOTE — ED Triage Notes (Signed)
Per [facility], patient from home, took her mirtazapine tonight, trip and fall taking the trash out hitting face to concrete. Deformity to nose, lac to lip and abrasion to L cheek. Denies LOC. A&Ox4.  CBG 134 HR 100 98% RA RR 16

## 2021-10-16 NOTE — ED Provider Notes (Signed)
[facility] DEPT Provider Note   CSN: [id number] Arrival date & time: 10/16/21  2139     History {Add pertinent medical, surgical, social history, OB history to HPI:1} Chief Complaint  Patient presents with   Cindy Wong    Cindy Wong is a 88 y.o. female who presents with her niece at the bedside with concern for facial injuries after mechanical fall.  Patient states she is a diabetic, put off eating her dinner and took her Remeron (sleep aid for evenings).  States that she ambulated outside to the dumpster to take her trash out on her way back began to feel very lightheaded and subsequently fell forward hitting her face on the concrete sidewalk.  Denies LOC nausea vomiting blurry or double vision.  States that she was able to crawl back up to her front door and stand up and walk into her home at which time she called 911.  Her niece at bedside states that she has been behaving normally for herself she is ANO x3.  Abrasions to the face, swelling to the nose and laceration to the lip.  I personally reviewed Medical records.  She has history of diabetes, hypertension, GIST, ductal carcinoma in situ of the breast.  She has not anticoagulated. Unknown last tetanus.   HPI     Home Medications Prior to Admission medications   Medication Sig Start Date End Date Taking? Authorizing Provider  amLODipine (NORVASC) 5 MG tablet Take 5 mg by mouth daily. In the morning    [provider]  carvedilol (COREG) 6.25 MG tablet TAKE 1 TABLET(6.25 MG) BY MOUTH TWICE DAILY 06/22/21   Cantwell, Celeste C, PA-C  Cholecalciferol (D2000 ULTRA STRENGTH) 50 MCG (2000 UT) CAPS Take by mouth daily.    [provider]  gabapentin (NEURONTIN) 100 MG capsule TAKE 1 CAPSULE(100 MG) BY MOUTH AT BEDTIME 02/15/21   Truitt Merle, MD  glipiZIDE (GLUCOTROL XL) 10 MG 24 hr tablet Take 10 mg by mouth daily.    [provider]  hydrochlorothiazide (HYDRODIURIL) 25 MG tablet  Take 25 mg by mouth daily. In the morning 10/14/17   [provider]  linaGLIPtin-metFORMIN HCl 2.5-500 MG TABS Take 1 tablet by mouth 2 (two) times daily.     [provider]  lisinopril (PRINIVIL,ZESTRIL) 40 MG tablet Take 40 mg by mouth daily. In the morning 11/09/16   [provider]  mirtazapine (REMERON) 7.5 MG tablet Take 7.5 mg by mouth at bedtime. 10/04/21   [provider]  Multiple Vitamins-Minerals (CENTRUM SILVER PO) Take 1 tablet by mouth daily.     [provider]  nitrofurantoin, macrocrystal-monohydrate, (MACROBID) 100 MG capsule Take 100 mg by mouth 2 (two) times daily. 10/12/21   [provider]  [facility] VERIO test strip 2 (two) times daily. for testing 08/11/17   [provider]  potassium chloride SA (K-DUR,KLOR-CON) 20 MEQ tablet Take 20 mEq by mouth daily.    [provider]  pravastatin (PRAVACHOL) 40 MG tablet Take 40 mg by mouth every evening.    [provider]      Allergies    Sulfamethoxazole and Sulfonamide derivatives    Review of Systems   Review of Systems  Eyes:  Negative for photophobia and visual disturbance.  Musculoskeletal:  Negative for neck pain.  Skin:  Positive for wound.  Neurological:  Positive for light-headedness. Negative for headaches.    Physical Exam Updated Vital Signs BP (!) 151/98 (BP Location: Right Arm)  Pulse 90   Temp 98 F (36.7 C) (Oral)   Resp 18   Ht '5\' 3"'$  (1.6 m)   Wt 68.5 kg   SpO2 96%   BMI 26.75 kg/m  Physical Exam Vitals and nursing note reviewed.  Constitutional:      Appearance: She is not ill-appearing or toxic-appearing.  HENT:     Head: No raccoon eyes or Battle's sign.      Right Ear: No hemotympanum.     Left Ear: No hemotympanum.     Nose:     Right Nostril: Epistaxis present. No septal hematoma.     Left Nostril: No septal hematoma.     Mouth/Throat:     Mouth: Mucous membranes are moist.     Pharynx: Uvula midline.  No oropharyngeal exudate or posterior oropharyngeal erythema.      Comments: Bruising to the internal upper lip without laceration. Eyes:     General:        Right eye: No discharge.        Left eye: No discharge.     Extraocular Movements: Extraocular movements intact.     Conjunctiva/sclera: Conjunctivae normal.     Pupils: Pupils are equal, round, and reactive to light.  Neck:     Trachea: Trachea and phonation normal. No tracheostomy.  Cardiovascular:     Rate and Rhythm: Normal rate and regular rhythm.     Pulses: Normal pulses.     Heart sounds: Normal heart sounds. No murmur heard. Pulmonary:     Effort: Pulmonary effort is normal. No respiratory distress.     Breath sounds: Normal breath sounds. No wheezing or rales.  Abdominal:     General: There is no distension.     Palpations: Abdomen is soft.     Tenderness: There is no abdominal tenderness. There is no guarding or rebound.  Musculoskeletal:        General: No deformity.     Right shoulder: Normal.     Left shoulder: Normal.     Right upper arm: Normal.     Left upper arm: Normal.     Right elbow: Normal.     Left elbow: Normal.     Right forearm: Normal.     Left forearm: Normal.     Right wrist: Normal.     Left wrist: Normal.     Right hand: Normal.     Left hand: Normal.     Cervical back: Normal, normal range of motion and neck supple. No bony tenderness. No spinous process tenderness or muscular tenderness.     Thoracic back: Normal. No bony tenderness.     Lumbar back: Normal. No bony tenderness.     Right hip: Normal.     Left hip: Normal.     Right upper leg: Normal.     Left upper leg: Normal.     Right knee: Normal.     Left knee: Normal.     Right lower leg: Normal. No edema.     Left lower leg: Normal. No edema.     Right ankle: Normal.     Right Achilles Tendon: Normal.     Left ankle: Normal.     Left Achilles Tendon: Normal.     Right foot: Normal.     Left foot: Normal.   Lymphadenopathy:     Cervical: No cervical adenopathy.  Skin:    General: Skin is warm and dry.     Capillary Refill: Capillary refill takes  less than 2 seconds.     Findings: Abrasion and laceration present.  Neurological:     General: No focal deficit present.     Mental Status: She is alert and oriented to person, place, and time. Mental status is at baseline.     GCS: GCS eye subscore is 4. GCS verbal subscore is 5. GCS motor subscore is 6.     Gait: Gait is intact.  Psychiatric:        Mood and Affect: Mood normal.     ED Results / Procedures / Treatments   Labs (all labs ordered are listed, but only abnormal results are displayed) Labs Reviewed - No data to display  EKG None  Radiology No results found.  Procedures Procedures  {Document cardiac monitor, telemetry assessment procedure when appropriate:1}  Medications Ordered in ED Medications  lidocaine-EPINEPHrine-tetracaine (LET) topical gel (has no administration in time range)    ED Course/ Medical Decision Making/ A&P                           Medical Decision Making 88 year old female presents with concern for mechanical fall with facial wounds and lacerations.  Hypertensive on intake, vitals otherwise normal.  Cardiopulmonary exam is normal, abdominal exam is benign.  Neurologic exam without focal deficit, GCS 15.  Patient went as above.  No deformities or decreased range of motion in the upper or lower extremities bilaterally.  No midline tenderness palpation of the C/T/L-spine.  Amount and/or Complexity of Data Reviewed Radiology: ordered.   ***  {Document critical care time when appropriate:1} {Document review of labs and clinical decision tools ie heart score, Chads2Vasc2 etc:1}  {Document your independent review of radiology images, and any outside records:1} {Document your discussion with family members, caretakers, and with consultants:1} {Document social determinants of health affecting pt's  care:1} {Document your decision making why or why not admission, treatments were needed:1} Final Clinical Impression(s) / ED Diagnoses Final diagnoses:  None    Rx / DC Orders ED Discharge Orders     None

## 2021-10-17 DIAGNOSIS — S01511A Laceration without foreign body of lip, initial encounter: Secondary | ICD-10-CM | POA: Diagnosis not present

## 2021-10-17 LAB — CBG MONITORING, ED: Glucose-Capillary: 108 mg/dL — ABNORMAL HIGH (ref 70–99)

## 2021-10-17 NOTE — Discharge Instructions (Addendum)
You were seen in the ER today after your fall. Your CT scans revealed a broken nose, for which you may follow up with the ENT listed below. Please call their office to schedule a follow up appointment. Your lip laceration was repaired with 3 sutures which will dissolve on their own. Please dress your abrasions with antibiotic ointment daily and follow up with your primary care doctor. Return to the ER with any new severe symptoms. You may take tylenol at home for your soreness.

## 2021-10-24 DIAGNOSIS — S0181XA Laceration without foreign body of other part of head, initial encounter: Secondary | ICD-10-CM | POA: Diagnosis not present

## 2021-10-24 DIAGNOSIS — S022XXA Fracture of nasal bones, initial encounter for closed fracture: Secondary | ICD-10-CM | POA: Diagnosis not present

## 2021-10-24 DIAGNOSIS — S0993XA Unspecified injury of face, initial encounter: Secondary | ICD-10-CM | POA: Diagnosis not present

## 2021-10-25 DIAGNOSIS — I10 Essential (primary) hypertension: Secondary | ICD-10-CM | POA: Diagnosis not present

## 2021-10-26 DIAGNOSIS — S01511A Laceration without foreign body of lip, initial encounter: Secondary | ICD-10-CM | POA: Diagnosis not present

## 2021-10-26 DIAGNOSIS — S022XXA Fracture of nasal bones, initial encounter for closed fracture: Secondary | ICD-10-CM | POA: Diagnosis not present

## 2021-10-26 DIAGNOSIS — R600 Localized edema: Secondary | ICD-10-CM | POA: Diagnosis not present

## 2021-10-26 DIAGNOSIS — W19XXXA Unspecified fall, initial encounter: Secondary | ICD-10-CM | POA: Diagnosis not present

## 2021-10-26 DIAGNOSIS — T07XXXA Unspecified multiple injuries, initial encounter: Secondary | ICD-10-CM | POA: Diagnosis not present

## 2021-11-24 DIAGNOSIS — I10 Essential (primary) hypertension: Secondary | ICD-10-CM | POA: Diagnosis not present

## 2021-12-13 DIAGNOSIS — N390 Urinary tract infection, site not specified: Secondary | ICD-10-CM | POA: Diagnosis not present

## 2021-12-16 DIAGNOSIS — Z23 Encounter for immunization: Secondary | ICD-10-CM | POA: Diagnosis not present

## 2021-12-25 DIAGNOSIS — I10 Essential (primary) hypertension: Secondary | ICD-10-CM | POA: Diagnosis not present

## 2021-12-26 ENCOUNTER — Encounter: Payer: Self-pay | Admitting: Podiatry

## 2021-12-26 ENCOUNTER — Ambulatory Visit (INDEPENDENT_AMBULATORY_CARE_PROVIDER_SITE_OTHER): Payer: Medicare Other | Admitting: Podiatry

## 2021-12-26 DIAGNOSIS — E1151 Type 2 diabetes mellitus with diabetic peripheral angiopathy without gangrene: Secondary | ICD-10-CM

## 2021-12-26 DIAGNOSIS — L84 Corns and callosities: Secondary | ICD-10-CM

## 2021-12-26 DIAGNOSIS — B351 Tinea unguium: Secondary | ICD-10-CM

## 2021-12-26 DIAGNOSIS — M79676 Pain in unspecified toe(s): Secondary | ICD-10-CM

## 2021-12-26 NOTE — Progress Notes (Signed)
This patient returns to my office for at risk foot care.  This patient requires this care by a professional since this patient will be at risk due to having diabetes.   This patient is unable to cut nails herself since the patient cannot reach her nails.  These nails is painful walking and wearing her shoes. . This patient presents for at risk foot care today.  General Appearance  Alert, conversant and in no acute stress.  Vascular  Dorsalis pedis  pulses are palpable  Bilaterally. Posterior tibial pulses are absent  B/L.  Capillary return is within normal limits  bilaterally. Temperature is within normal limits  Bilaterally  .Absent digital hair.  Neurologic  Senn-Weinstein monofilament wire test within normal limits  bilaterally. Muscle power within normal limits bilaterally.  Nails Thick disfigured discolored nails with subungual debris  from hallux to fifth toes bilaterally. No evidence of bacterial infection or drainage bilaterally.  Orthopedic  No limitations of motion  feet .  No crepitus or effusions noted.  No bony pathology or digital deformities noted.  midfoot arthritis  B/L.  Skin  normotropic skin with no porokeratosis noted bilaterally.  No signs of infections or ulcers noted.    symptomatic  HD 5th toe left.  Onychomycosis  Pain in right toes  Pain in left toes  .[place] durum fifth toe left foot.  Consent was obtained for treatment procedures.   Mechanical debridement of nails 1-5  bilaterally performed with a nail nipper.  Filed with dremel without incident. Debride HD with # 15 blade and dremel tool.   Return office visit    3 months                  Told patient to return for periodic foot care and evaluation due to potential at risk complications.   Gardiner Barefoot DPM

## 2021-12-27 DIAGNOSIS — R21 Rash and other nonspecific skin eruption: Secondary | ICD-10-CM | POA: Diagnosis not present

## 2021-12-27 DIAGNOSIS — B029 Zoster without complications: Secondary | ICD-10-CM | POA: Diagnosis not present

## 2021-12-27 DIAGNOSIS — Z23 Encounter for immunization: Secondary | ICD-10-CM | POA: Diagnosis not present

## 2022-01-03 NOTE — Progress Notes (Unsigned)
[place]   Telephone:([phone]) [phone] Fax:(336) [phone]   Clinic Follow up Note   Patient Care Team: Tisovec, Fransico Him, MD as PCP - Philomena Doheny, Paulette Blanch, RN as Oncology Nurse Navigator Rockwell Germany, RN as Oncology Nurse Navigator Truitt Merle, MD as Consulting Physician (Hematology) Alla Feeling, NP as Nurse Practitioner (Nurse Practitioner) Donnie Mesa, MD as Consulting Physician (General Surgery) 01/04/2022  CHIEF COMPLAINT: Follow up left breast cancer, h/o right breast DCIS, and h/o GIST    SUMMARY OF ONCOLOGIC HISTORY: Oncology History Overview Note  Cancer Staging Malignant neoplasm of central left breast [facility]) Staging form: Breast, AJCC 8th Edition - Clinical stage from 11/19/2019: Stage IB (cT2, cN0, cM0, G2, ER+, PR+, HER2-) - Signed by Alla Feeling, NP on 11/23/2019 - Pathologic stage from 12/08/2019: Stage Unknown (pT2, pNX, cM0, G2, ER+, PR+, HER2-) - Signed by Truitt Merle, MD on 12/25/2019    Malignant neoplasm of central left breast ([place])  11/09/2019 Breast US   IMPRESSION: 1. Highly suspicious left breast mass at the 12 o'clock position 4 cm from the nipple. Recommendation is for ultrasound-guided biopsy. 2. Indeterminate hyperechoic mass at the 12 o'clock position 4 cm from the nipple, just superior to the index lesion. This may correspond with an additional asymmetry seen mammographically. Recommendation is for ultrasound-guided biopsy with close attention on post clip films to ensure mammographic correlation. 3. No suspicious left axillary lymphadenopathy. 4. No suspicious findings at the site of the right mastectomy bed palpable lump.   11/19/2019 Cancer Staging   Staging form: Breast, AJCC 8th Edition - Clinical stage from 11/19/2019: Stage IB (cT2, cN0, cM0, G2, ER+, PR+, HER2-) - Signed by Alla Feeling, NP on 11/23/2019   11/19/2019 Initial Biopsy   Diagnosis Breast, left, needle core biopsy, 12 o'clock - INVASIVE MAMMARY  CARCINOMA - SEE COMMENT E-cadherin is POSITIVE supporting a ductal origin. PROGNOSTIC INDICATORS Results: IMMUNOHISTOCHEMICAL AND MORPHOMETRIC ANALYSIS PERFORMED MANUALLY The tumor cells are NEGATIVE for Her2 (1+). Estrogen Receptor: 95%, POSITIVE, STRONG STAINING INTENSITY Progesterone Receptor: 95%, POSITIVE, STRONG STAINING INTENSITY Proliferation Marker Ki67: 10%   11/23/2019 Initial Diagnosis   Malignant neoplasm of left breast ([place])   11/29/2019 Breast MRI   IMPRESSION: 1. 3.1 cm biopsy-proven malignancy within the UPPER LEFT breast. 2. Indeterminate 0.9 cm posterior central/LOWER LEFT breast mass. If breast conservation is desired, 2nd-look ultrasound and possible biopsy is recommended. If this mass is not identified sonographically, than MR guided biopsy would be recommended. 3. No abnormal appearing lymph nodes. 4. RIGHT mastectomy without suspicious abnormalities in the RIGHT mastectomy bed.   12/08/2019 Cancer Staging   Staging form: Breast, AJCC 8th Edition - Pathologic stage from 12/08/2019: Stage Unknown (pT2, pNX, cM0, G2, ER+, PR+, HER2-) - Signed by Truitt Merle, MD on 12/25/2019   12/08/2019 Surgery   Left Mastectomy by Dr Georgette Dover   12/08/2019 Pathology Results   FINAL MICROSCOPIC DIAGNOSIS:   A. BREAST, LEFT, MASTECTOMY:  - Invasive ductal carcinoma, multifocal, 2.8 cm in greatest dimension,  [place] grade 2 of 3.  - Margins of resection are not involved.  - See oncology table.  - See oncology table.    03/2020 -  Anti-estrogen oral therapy   Anastrozole 71m once daily starting 01/2020   03/22/2020 Survivorship   SCP delivered by LCira Rue NP      CURRENT THERAPY: Surveillance, tried anastrozole for 6 months in 2022 but stopped due to poor tolerance/SE's  INTERVAL HISTORY: Ms. WGuedesreturns for follow up  as scheduled. Last seen by me 07/06/21. She fell in August and broke her nose. She has been doing well in the interim, until she woke up nauseous  this morning with 2 dark BMs. Her neighbor made her beef stew that she questions. Otherwise she's had no bowel changes, n/v, or new abdominal pain. She noticed less urine output last night also. In 09/2021 she was treated for UTI but not sure it went away. Denies any urinary symptoms currently. She saw PCP last week for possible shingles and was given 1 week valtrex. She continues to try to get an appt with derm. She does her own breast/chest exams, denies nodules or lumps.   Straight from this visit Ms. Benard has to go to a [employer] for a woman she raised who she was very close to.  All other systems were reviewed with the patient and are negative.  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   partial gastrectomy    SURGICAL HISTORY: Past Surgical History:  Procedure Laterality Date   ABDOMINAL HYSTERECTOMY  1974   Supracervical   BREAST LUMPECTOMY Right 1993   BREAST SURGERY     Mastectomy   CHOLECYSTECTOMY     COLONOSCOPY     MASTECTOMY Right    MASTECTOMY, PARTIAL Left 12/08/2019   Procedure: LEFT MASTECTOMY;  Surgeon: Donnie Mesa, MD;  Location: [facility];  Service: General;  Laterality: Left;  LMA AND PECTORAL BLOCK   meningitis     OOPHORECTOMY     One ovary removed--?side   Stomach tumor     UPPER GASTROINTESTINAL ENDOSCOPY      I have reviewed the social history and family history with the patient and they are unchanged from previous note.  ALLERGIES:  is allergic to sulfamethoxazole and sulfonamide derivatives.  MEDICATIONS:  Current Outpatient  Medications  Medication Sig Dispense Refill   amLODipine (NORVASC) 5 MG tablet Take 5 mg by mouth daily. In the morning     carvedilol (COREG) 6.25 MG tablet TAKE 1 TABLET(6.25 MG) BY MOUTH TWICE DAILY 180 tablet 1   Cholecalciferol (D2000 ULTRA STRENGTH) 50 MCG (2000 UT) CAPS Take by mouth daily.     glipiZIDE (GLUCOTROL XL) 10 MG 24 hr tablet Take 10 mg by mouth daily.     hydrochlorothiazide (HYDRODIURIL) 25 MG tablet Take 25 mg by mouth daily. In the morning  10   linaGLIPtin-metFORMIN HCl 2.5-500 MG TABS Take 1 tablet by mouth 2 (two) times daily.      lisinopril (PRINIVIL,ZESTRIL) 40 MG tablet Take 40 mg by mouth daily. In the morning  0   Multiple Vitamins-Minerals (CENTRUM SILVER PO) Take 1 tablet by mouth daily.      ONETOUCH VERIO test strip 2 (two) times daily. for testing  2   potassium chloride SA (K-DUR,KLOR-CON) 20 MEQ tablet Take 20 mEq by mouth daily.     pravastatin (PRAVACHOL) 40 MG tablet Take 40 mg by mouth every evening.  valACYclovir (VALTREX) 1000 MG tablet Take 1,000 mg by mouth 3 (three) times daily.     gabapentin (NEURONTIN) 100 MG capsule TAKE 1 CAPSULE(100 MG) BY MOUTH AT BEDTIME (Patient not taking: Reported on 01/04/2022) 30 capsule 0   mirtazapine (REMERON) 7.5 MG tablet Take 7.5 mg by mouth at bedtime. (Patient not taking: Reported on 01/04/2022)     nitrofurantoin, macrocrystal-monohydrate, (MACROBID) 100 MG capsule Take 100 mg by mouth 2 (two) times daily. (Patient not taking: Reported on 01/04/2022)     Current Facility-Administered Medications  Medication Dose Route Frequency Provider Last Rate Last Admin   ondansetron (ZOFRAN) tablet 4 mg  4 mg Oral Once Alla Feeling, NP        PHYSICAL EXAMINATION: ECOG PERFORMANCE STATUS: 1 - Symptomatic but completely ambulatory  Vitals:   01/04/22 0940  BP: (!) 164/64  Pulse: 76  Resp: 16  Temp: 98.4 F (36.9 C)  SpO2: 100%   Filed Weights   01/04/22 0940  Weight: 153 lb (69.4 kg)     GENERAL:alert, no distress and comfortable SKIN: no rash to unclothed skin EYES: sclera clear NECK: without mass LYMPH:  no palpable cervical or supraclavicular lymphadenopathy LUNGS:  normal breathing effort HEART:  no lower extremity edema ABDOMEN:abdomen soft, non-tender and normal bowel sounds. No CVAT Musculoskeletal: no focal spinal pain  NEURO: alert & oriented x 3 with fluent speech, no focal motor/sensory deficits Breast exam: s/p bilateral mastectomy. Incisions/scars completely healed. Prominent bones at the right chest wall. Left mastectomy scar with mild erythema and small superficial nodules at the upper outer quadrant/chest wall beginning 2 fingerbreadths superior to the lateral edge of the scar   LABORATORY DATA:  I have reviewed the data as listed    Latest Ref Rng & Units 01/04/2022    8:53 AM 07/06/2021    9:35 AM 01/05/2021    9:34 AM  CBC  WBC 4.0 - 10.5 K/uL 10.9  7.3  8.4   Hemoglobin 12.0 - 15.0 g/dL 13.7  13.8  14.0   Hematocrit 36.0 - 46.0 % 43.0  43.1  43.7   Platelets 150 - 400 K/uL 325  272  281         Latest Ref Rng & Units 01/04/2022    8:53 AM 07/06/2021    9:35 AM 01/05/2021    9:34 AM  CMP  Glucose 70 - 99 mg/dL 170  158  159   BUN 8 - 23 mg/dL 40  15  15   Creatinine 0.44 - 1.00 mg/dL 2.57  0.91  0.88   Sodium 135 - 145 mmol/L 136  140  141   Potassium 3.5 - 5.1 mmol/L 4.5  3.9  4.1   Chloride 98 - 111 mmol/L 105  104  104   CO2 22 - 32 mmol/L _0 Calcium 8.9 - 10.3 mg/dL 9.5  9.6  9.8   Total Protein 6.5 - 8.1 g/dL 6.9  7.0  7.0   Total Bilirubin 0.3 - 1.2 mg/dL 0.4  0.5  0.4   Alkaline Phos 38 - 126 U/L 43  49  55   AST 15 - 41 U/L _1 ALT 0 - 44 U/L _2 RADIOGRAPHIC STUDIES: I have personally reviewed the radiological images as listed and agreed with the findings in the report. No results found.   ASSESSMENT & PLAN: Tyashia Morrisette is  a 88 y.o. female with    1.  Malignant neoplasm of  the left breast, grade 2, ER 95% strongly positive, PR 95% strongly positive, HER-2 negative (1+) Ki-67 10%, cT2 N0 M0 stage Ib -Diagnosed in 10/2019, s/p left mastectomy by Dr Johnny Bridge on 12/08/19. Her surgical path showed grade II IDC, 2.8 cm, with clear margins. Due to negative lymph nodes she did not require postmastectomy radiation.  -She tried anastrozole 07/07/20 - 12/2020, stopped due to SE's (caused sluggishness and difficulty with normal activities) -Ms. Byrns appear stable. She fell in 09/2021, broke her nose. She has recovered mostly well  -Breast exam shows new small nodularity at the left chest wall superior to the mastectomy scar. I am referring her for [place] to r/o recurrence. I will call with results.  -If work up is negative, continue surveillance   2. ?AKI -Scr normal in the past, 2.57 today and confirmed by lab. She reports decreased UOP since last night. Denies pain, hematuria -Unclear how long this has been present, no labs in 6 months in Epic/care everywhere -BP and DM have been controlled lately, likely not the source -She fell in 09/2021 and was treated for possible UTI, she did not have urinary symptoms.   -Repeat UA today  -Will likely proceed with renal US or CT wo contrast as next step, I will discuss with PCP   3. History of recurrent right breast DCIS  -Diagnosed initially in 07-Apr-1991, s/p lumpectomy and adjuvant radiation. She reportedly tried antiestrogen therapy but did not tolerate well. -She had a recurrence of ER/PR positive DCIS in 07/2005, s/p right mastectomy on 08/24/2005   4. H/O GIST -Diagnosed in April 07, 1995, s/p partial gastrectomy, followed by Dr. Ardis Hughs  -She woke up nauseous today, she thinks from something a neighbor made for dinner last night. We gave her zofran x1 in clinic -No other clinical signs of recurrence   5. Bone health  -Most recent DEXA by PCP on 03/25/20. T-score was -0.7.   6. Genetics  -She has personal h/o recurrent DCIS, GIST and left breast  cancer. She qualifies for genetics.  -Sister passed from cancer, unknown type, brother with metastatic prostate cancer. Does not have children, has sibling and nieces  -She was previously referred to Genetics but has not undergone testing   7. Social Support  -a sister passed away in 04/07/19 and a brother with metastatic prostate cancer -She lives alone but has brother who lives in town.  -A woman Ms. Alcaraz raised recently passed, her memorial service is today right after this visit.   PLAN: -labs reviewed -New left chest wall nodularity, proceed with [place]; will call with results -For ?AKI, Scr confirmed by lab, she will return after memorial service for UA/culture. If negative, may proceed with renal US. I have requested labs from PCP to trend -For nausea, Zofran today x1 -F/up pending work up    Orders Placed This Encounter  Procedures   Urine Culture    Standing Status:   Future    Standing Expiration Date:   01/04/2023   [place] CHEST SOFT TISSUE    Standing Status:   Future    Standing Expiration Date:   01/05/2023    Order Specific Question:   Reason for Exam (SYMPTOM  OR DIAGNOSIS REQUIRED)    Answer:   h/o R br DCIS and left breast cancer s/p bilateral mastectomy. new nodularity at upper outer left chest wall near mastectomy scar. r/o recurrence    Order Specific Question:  Preferred imaging location?    Answer:   [facility]   Urinalysis, Complete w Microscopic    Standing Status:   Future    Standing Expiration Date:   01/05/2023   All questions were answered. The patient knows to call the clinic with any problems, questions or concerns. No barriers to learning was detected. I spent 20 minutes counseling the patient face to face. The total time spent in the appointment was 30 minutes and more than 50% was on counseling and review of test results and coordination of care.      Alla Feeling, NP 01/04/22

## 2022-01-04 ENCOUNTER — Inpatient Hospital Stay: Payer: Medicare Other

## 2022-01-04 ENCOUNTER — Inpatient Hospital Stay (HOSPITAL_BASED_OUTPATIENT_CLINIC_OR_DEPARTMENT_OTHER): Payer: Medicare Other | Admitting: Nurse Practitioner

## 2022-01-04 ENCOUNTER — Inpatient Hospital Stay: Payer: Medicare Other | Attending: Nurse Practitioner

## 2022-01-04 ENCOUNTER — Encounter: Payer: Self-pay | Admitting: Nurse Practitioner

## 2022-01-04 ENCOUNTER — Telehealth: Payer: Self-pay

## 2022-01-04 ENCOUNTER — Other Ambulatory Visit: Payer: Self-pay

## 2022-01-04 VITALS — BP 164/64 | HR 76 | Temp 98.4°F | Resp 16 | Wt 153.0 lb

## 2022-01-04 DIAGNOSIS — C50912 Malignant neoplasm of unspecified site of left female breast: Secondary | ICD-10-CM | POA: Diagnosis not present

## 2022-01-04 DIAGNOSIS — C49A2 Gastrointestinal stromal tumor of stomach: Secondary | ICD-10-CM

## 2022-01-04 DIAGNOSIS — Z87440 Personal history of urinary (tract) infections: Secondary | ICD-10-CM | POA: Insufficient documentation

## 2022-01-04 DIAGNOSIS — Z853 Personal history of malignant neoplasm of breast: Secondary | ICD-10-CM | POA: Insufficient documentation

## 2022-01-04 DIAGNOSIS — Z90721 Acquired absence of ovaries, unilateral: Secondary | ICD-10-CM | POA: Diagnosis not present

## 2022-01-04 DIAGNOSIS — C50812 Malignant neoplasm of overlapping sites of left female breast: Secondary | ICD-10-CM | POA: Insufficient documentation

## 2022-01-04 DIAGNOSIS — Z79899 Other long term (current) drug therapy: Secondary | ICD-10-CM | POA: Diagnosis not present

## 2022-01-04 DIAGNOSIS — I10 Essential (primary) hypertension: Secondary | ICD-10-CM | POA: Diagnosis not present

## 2022-01-04 DIAGNOSIS — Z170 Estrogen receptor positive status [ER+]: Secondary | ICD-10-CM | POA: Diagnosis not present

## 2022-01-04 DIAGNOSIS — Z882 Allergy status to sulfonamides status: Secondary | ICD-10-CM | POA: Insufficient documentation

## 2022-01-04 DIAGNOSIS — N6489 Other specified disorders of breast: Secondary | ICD-10-CM | POA: Diagnosis not present

## 2022-01-04 DIAGNOSIS — N179 Acute kidney failure, unspecified: Secondary | ICD-10-CM | POA: Diagnosis not present

## 2022-01-04 DIAGNOSIS — Z9049 Acquired absence of other specified parts of digestive tract: Secondary | ICD-10-CM | POA: Insufficient documentation

## 2022-01-04 DIAGNOSIS — Z85028 Personal history of other malignant neoplasm of stomach: Secondary | ICD-10-CM | POA: Diagnosis not present

## 2022-01-04 DIAGNOSIS — R6889 Other general symptoms and signs: Secondary | ICD-10-CM | POA: Diagnosis not present

## 2022-01-04 DIAGNOSIS — Z9013 Acquired absence of bilateral breasts and nipples: Secondary | ICD-10-CM | POA: Diagnosis not present

## 2022-01-04 LAB — COMPREHENSIVE METABOLIC PANEL
ALT: 10 U/L (ref 0–44)
AST: 18 U/L (ref 15–41)
Albumin: 4 g/dL (ref 3.5–5.0)
Alkaline Phosphatase: 43 U/L (ref 38–126)
Anion gap: 7 (ref 5–15)
BUN: 40 mg/dL — ABNORMAL HIGH (ref 8–23)
CO2: 24 mmol/L (ref 22–32)
Calcium: 9.5 mg/dL (ref 8.9–10.3)
Chloride: 105 mmol/L (ref 98–111)
Creatinine, Ser: 2.57 mg/dL — ABNORMAL HIGH (ref 0.44–1.00)
GFR, Estimated: 17 mL/min — ABNORMAL LOW (ref >60)
Glucose, Bld: 170 mg/dL — ABNORMAL HIGH (ref 70–99)
Potassium: 4.5 mmol/L (ref 3.5–5.1)
Sodium: 136 mmol/L (ref 135–145)
Total Bilirubin: 0.4 mg/dL (ref 0.3–1.2)
Total Protein: 6.9 g/dL (ref 6.5–8.1)

## 2022-01-04 LAB — CBC WITH DIFFERENTIAL/PLATELET
Abs Immature Granulocytes: 0.09 10*3/uL — ABNORMAL HIGH (ref 0.00–0.07)
Basophils Absolute: 0.1 10*3/uL (ref 0.0–0.1)
Basophils Relative: 1 %
Eosinophils Absolute: 0.1 10*3/uL (ref 0.0–0.5)
Eosinophils Relative: 1 %
HCT: 43 % (ref 36.0–46.0)
Hemoglobin: 13.7 g/dL (ref 12.0–15.0)
Immature Granulocytes: 1 %
Lymphocytes Relative: 14 %
Lymphs Abs: 1.6 10*3/uL (ref 0.7–4.0)
MCH: 26.8 pg (ref 26.0–34.0)
MCHC: 31.9 g/dL (ref 30.0–36.0)
MCV: 84 fL (ref 80.0–100.0)
Monocytes Absolute: 0.9 10*3/uL (ref 0.1–1.0)
Monocytes Relative: 9 %
Neutro Abs: 8.2 10*3/uL — ABNORMAL HIGH (ref 1.7–7.7)
Neutrophils Relative %: 74 %
Platelets: 325 10*3/uL (ref 150–400)
RBC: 5.12 MIL/uL — ABNORMAL HIGH (ref 3.87–5.11)
RDW: 13.3 % (ref 11.5–15.5)
WBC: 10.9 10*3/uL — ABNORMAL HIGH (ref 4.0–10.5)
nRBC: 0 % (ref 0.0–0.2)

## 2022-01-04 MED ORDER — ONDANSETRON HCL 8 MG PO TABS: 4.0000 mg | ORAL_TABLET | Freq: Once | ORAL | Status: DC

## 2022-01-04 MED ORDER — ONDANSETRON HCL 8 MG PO TABS
4.0000 mg | ORAL_TABLET | Freq: Once | ORAL | Status: AC
  Administered 2022-01-04: 4 mg via ORAL
  Filled 2022-01-04: qty 1

## 2022-01-05 ENCOUNTER — Telehealth: Payer: Self-pay

## 2022-01-05 ENCOUNTER — Other Ambulatory Visit: Payer: Self-pay | Admitting: Nurse Practitioner

## 2022-01-05 DIAGNOSIS — N179 Acute kidney failure, unspecified: Secondary | ICD-10-CM

## 2022-01-05 DIAGNOSIS — N6489 Other specified disorders of breast: Secondary | ICD-10-CM | POA: Diagnosis not present

## 2022-01-05 DIAGNOSIS — I10 Essential (primary) hypertension: Secondary | ICD-10-CM | POA: Diagnosis not present

## 2022-01-05 DIAGNOSIS — Z170 Estrogen receptor positive status [ER+]: Secondary | ICD-10-CM | POA: Diagnosis not present

## 2022-01-05 DIAGNOSIS — Z79899 Other long term (current) drug therapy: Secondary | ICD-10-CM | POA: Diagnosis not present

## 2022-01-05 DIAGNOSIS — Z85028 Personal history of other malignant neoplasm of stomach: Secondary | ICD-10-CM | POA: Diagnosis not present

## 2022-01-05 DIAGNOSIS — C50812 Malignant neoplasm of overlapping sites of left female breast: Secondary | ICD-10-CM | POA: Diagnosis not present

## 2022-01-05 LAB — URINALYSIS, COMPLETE (UACMP) WITH MICROSCOPIC
Bilirubin Urine: NEGATIVE
Glucose, UA: NEGATIVE mg/dL
Ketones, ur: NEGATIVE mg/dL
Leukocytes,Ua: NEGATIVE
Nitrite: NEGATIVE
Protein, ur: 100 mg/dL — AB
Specific Gravity, Urine: <1.005 — ABNORMAL LOW (ref 1.005–1.030)
pH: 6 (ref 5.0–8.0)

## 2022-01-05 NOTE — Telephone Encounter (Signed)
Returned call to patient she was called by [facility] about her appointment wanted to make sure she understood to be there at 12:10 on 11/21 for the [place] L/Chest wall. Patient voiced understanding.

## 2022-01-05 NOTE — Telephone Encounter (Signed)
Close encounter 

## 2022-01-05 NOTE — Telephone Encounter (Signed)
Called patient to make her aware of [place] appointment no answer.   [place] L/chest wall at 1:45 @ [facility] location on 01/09/22

## 2022-01-06 LAB — URINE CULTURE

## 2022-01-08 ENCOUNTER — Telehealth: Payer: Self-pay

## 2022-01-08 DIAGNOSIS — B029 Zoster without complications: Secondary | ICD-10-CM | POA: Diagnosis not present

## 2022-01-08 DIAGNOSIS — E7800 Pure hypercholesterolemia, unspecified: Secondary | ICD-10-CM | POA: Diagnosis not present

## 2022-01-08 DIAGNOSIS — N289 Disorder of kidney and ureter, unspecified: Secondary | ICD-10-CM | POA: Diagnosis not present

## 2022-01-08 DIAGNOSIS — I10 Essential (primary) hypertension: Secondary | ICD-10-CM | POA: Diagnosis not present

## 2022-01-08 DIAGNOSIS — Z853 Personal history of malignant neoplasm of breast: Secondary | ICD-10-CM | POA: Diagnosis not present

## 2022-01-08 DIAGNOSIS — E119 Type 2 diabetes mellitus without complications: Secondary | ICD-10-CM | POA: Diagnosis not present

## 2022-01-08 DIAGNOSIS — C50919 Malignant neoplasm of unspecified site of unspecified female breast: Secondary | ICD-10-CM | POA: Diagnosis not present

## 2022-01-08 NOTE — Telephone Encounter (Signed)
Called patient made her aware of [place] appointment at [facility] at 2:15 tomorrow for a Renal US. Patient needs to have a full bladder upon arival of scan. Patient voiced full understanding.

## 2022-01-09 ENCOUNTER — Other Ambulatory Visit: Payer: Medicare Other

## 2022-01-09 ENCOUNTER — Encounter: Payer: Self-pay | Admitting: Nurse Practitioner

## 2022-01-09 ENCOUNTER — Ambulatory Visit (HOSPITAL_BASED_OUTPATIENT_CLINIC_OR_DEPARTMENT_OTHER): Admission: RE | Admit: 2022-01-09 | Payer: Medicare Other | Source: Ambulatory Visit

## 2022-01-09 ENCOUNTER — Ambulatory Visit (HOSPITAL_COMMUNITY)
Admission: RE | Admit: 2022-01-09 | Discharge: 2022-01-09 | Disposition: A | Discharge status: Home / Self Care | Payer: Medicare Other | Source: Ambulatory Visit | Attending: Nurse Practitioner | Admitting: Nurse Practitioner

## 2022-01-09 DIAGNOSIS — N179 Acute kidney failure, unspecified: Secondary | ICD-10-CM | POA: Diagnosis not present

## 2022-01-09 DIAGNOSIS — N281 Cyst of kidney, acquired: Secondary | ICD-10-CM | POA: Diagnosis not present

## 2022-01-10 ENCOUNTER — Other Ambulatory Visit: Payer: Self-pay | Admitting: Nurse Practitioner

## 2022-01-10 ENCOUNTER — Other Ambulatory Visit: Payer: Self-pay

## 2022-01-10 ENCOUNTER — Telehealth: Payer: Self-pay | Admitting: Nurse Practitioner

## 2022-01-10 ENCOUNTER — Telehealth: Payer: Self-pay

## 2022-01-10 DIAGNOSIS — N179 Acute kidney failure, unspecified: Secondary | ICD-10-CM

## 2022-01-10 DIAGNOSIS — C50912 Malignant neoplasm of unspecified site of left female breast: Secondary | ICD-10-CM

## 2022-01-10 DIAGNOSIS — I10 Essential (primary) hypertension: Secondary | ICD-10-CM

## 2022-01-10 DIAGNOSIS — I493 Ventricular premature depolarization: Secondary | ICD-10-CM

## 2022-01-10 MED ORDER — CARVEDILOL 6.25 MG PO TABS: ORAL_TABLET | ORAL | 2 refills | Status: DC

## 2022-01-10 NOTE — Telephone Encounter (Signed)
I reviewed the renal US with Cindy Wong, which shows a stable left kidney cyst, no hydronephrosis; no explanation for AKI. We discussed proceeding with CT wo contrast for further evaluation vs nephrology referral. She would like to meet with specialist before doing more scans. Will place urgent nephrology referral if PCP has not already done so, Dr. Osborne Casco saw her on 11/20 but his note is not available to me. I have messaged him. In the meantime I encouraged Cindy Wong to hydrate and avoid NSAIDs/nephrotoxic meds. She understands and appreciates the call.   Cira Rue, NP

## 2022-01-10 NOTE — Telephone Encounter (Signed)
Called patient made her aware of [place] of Left Breast appointment for December 7 @ 1320 at [facility]. [address]

## 2022-01-16 ENCOUNTER — Encounter: Payer: Self-pay | Admitting: Hematology

## 2022-01-18 DIAGNOSIS — L853 Xerosis cutis: Secondary | ICD-10-CM | POA: Diagnosis not present

## 2022-01-18 DIAGNOSIS — L821 Other seborrheic keratosis: Secondary | ICD-10-CM | POA: Diagnosis not present

## 2022-01-18 DIAGNOSIS — L298 Other pruritus: Secondary | ICD-10-CM | POA: Diagnosis not present

## 2022-01-18 DIAGNOSIS — L2089 Other atopic dermatitis: Secondary | ICD-10-CM | POA: Diagnosis not present

## 2022-01-22 ENCOUNTER — Telehealth: Payer: Self-pay

## 2022-01-22 NOTE — Telephone Encounter (Signed)
-----   Message from Alla Feeling, NP sent at 01/19/2022 10:12 PM EST ----- Jenny Reichmann could you please follow up on the urgent nephrology referral I placed on 11/22.  Thanks, Regan Rakers

## 2022-01-22 NOTE — Telephone Encounter (Signed)
Called Dr. Sonda Primes office spoke with Estill Bamberg CMA she stated that a referral was sent to [facility] on 11/21 patient also called and was asking, Estill Bamberg is following up to find out why they have not called patient back.

## 2022-01-24 DIAGNOSIS — I10 Essential (primary) hypertension: Secondary | ICD-10-CM | POA: Diagnosis not present

## 2022-01-25 ENCOUNTER — Ambulatory Visit
Admission: RE | Admit: 2022-01-25 | Discharge: 2022-01-25 | Disposition: A | Discharge status: Home / Self Care | Payer: Medicare Other | Source: Ambulatory Visit | Attending: Nurse Practitioner | Admitting: Nurse Practitioner

## 2022-01-25 ENCOUNTER — Telehealth: Payer: Self-pay

## 2022-01-25 ENCOUNTER — Other Ambulatory Visit: Payer: Self-pay | Admitting: Nurse Practitioner

## 2022-01-25 DIAGNOSIS — C50912 Malignant neoplasm of unspecified site of left female breast: Secondary | ICD-10-CM

## 2022-01-25 DIAGNOSIS — R928 Other abnormal and inconclusive findings on diagnostic imaging of breast: Secondary | ICD-10-CM | POA: Diagnosis not present

## 2022-01-25 DIAGNOSIS — R92322 Mammographic fibroglandular density, left breast: Secondary | ICD-10-CM | POA: Diagnosis not present

## 2022-01-25 DIAGNOSIS — N6321 Unspecified lump in the left breast, upper outer quadrant: Secondary | ICD-10-CM | POA: Diagnosis not present

## 2022-01-25 DIAGNOSIS — N6320 Unspecified lump in the left breast, unspecified quadrant: Secondary | ICD-10-CM

## 2022-01-25 NOTE — Telephone Encounter (Signed)
Faxed an Urgent Stat Referral to [facility] for patient as per Cira Rue NP

## 2022-01-29 ENCOUNTER — Telehealth: Payer: Self-pay

## 2022-01-29 DIAGNOSIS — I129 Hypertensive chronic kidney disease with stage 1 through stage 4 chronic kidney disease, or unspecified chronic kidney disease: Secondary | ICD-10-CM | POA: Diagnosis not present

## 2022-01-29 DIAGNOSIS — N184 Chronic kidney disease, stage 4 (severe): Secondary | ICD-10-CM | POA: Diagnosis not present

## 2022-01-29 DIAGNOSIS — E559 Vitamin D deficiency, unspecified: Secondary | ICD-10-CM | POA: Diagnosis not present

## 2022-01-29 DIAGNOSIS — C50911 Malignant neoplasm of unspecified site of right female breast: Secondary | ICD-10-CM | POA: Diagnosis not present

## 2022-01-29 DIAGNOSIS — E1122 Type 2 diabetes mellitus with diabetic chronic kidney disease: Secondary | ICD-10-CM | POA: Diagnosis not present

## 2022-01-29 DIAGNOSIS — R799 Abnormal finding of blood chemistry, unspecified: Secondary | ICD-10-CM | POA: Diagnosis not present

## 2022-01-29 DIAGNOSIS — N179 Acute kidney failure, unspecified: Secondary | ICD-10-CM | POA: Diagnosis not present

## 2022-01-29 NOTE — Telephone Encounter (Signed)
Called and made patient aware of message below    Alla Feeling, NP sent to Elayne Guerin, CMA Please let pt know I saw the [place] report, and agree with biopsy which is scheduled 12/19. We will follow the result, and f/up in the office if needed. Thanks, Regan Rakers, NP

## 2022-01-29 NOTE — Telephone Encounter (Signed)
-----   Message from Alla Feeling, NP sent at 01/28/2022  8:29 PM EST ----- Please let pt know I saw the [place] report, and agree with biopsy which is scheduled 12/19. We will follow the result, and f/up in the office if needed.  Thanks, Regan Rakers, NP

## 2022-02-06 ENCOUNTER — Ambulatory Visit
Admission: RE | Admit: 2022-02-06 | Discharge: 2022-02-06 | Disposition: A | Discharge status: Home / Self Care | Payer: Medicare Other | Source: Ambulatory Visit | Attending: Nurse Practitioner | Admitting: Nurse Practitioner

## 2022-02-06 DIAGNOSIS — N6320 Unspecified lump in the left breast, unspecified quadrant: Secondary | ICD-10-CM

## 2022-02-06 DIAGNOSIS — C50912 Malignant neoplasm of unspecified site of left female breast: Secondary | ICD-10-CM

## 2022-02-06 DIAGNOSIS — C50412 Malignant neoplasm of upper-outer quadrant of left female breast: Secondary | ICD-10-CM | POA: Diagnosis not present

## 2022-02-06 DIAGNOSIS — N6321 Unspecified lump in the left breast, upper outer quadrant: Secondary | ICD-10-CM | POA: Diagnosis not present

## 2022-02-06 HISTORY — PX: BREAST BIOPSY: SHX20

## 2022-02-09 ENCOUNTER — Emergency Department (HOSPITAL_BASED_OUTPATIENT_CLINIC_OR_DEPARTMENT_OTHER)
Admission: EM | Admit: 2022-02-09 | Discharge: 2022-02-10 | Disposition: A | Discharge status: Home / Self Care | Payer: Medicare Other | Attending: Emergency Medicine | Admitting: Emergency Medicine

## 2022-02-09 ENCOUNTER — Encounter (HOSPITAL_BASED_OUTPATIENT_CLINIC_OR_DEPARTMENT_OTHER): Payer: Self-pay

## 2022-02-09 ENCOUNTER — Other Ambulatory Visit: Payer: Self-pay

## 2022-02-09 DIAGNOSIS — Z79899 Other long term (current) drug therapy: Secondary | ICD-10-CM | POA: Insufficient documentation

## 2022-02-09 DIAGNOSIS — Z853 Personal history of malignant neoplasm of breast: Secondary | ICD-10-CM | POA: Insufficient documentation

## 2022-02-09 DIAGNOSIS — E119 Type 2 diabetes mellitus without complications: Secondary | ICD-10-CM | POA: Insufficient documentation

## 2022-02-09 DIAGNOSIS — I10 Essential (primary) hypertension: Secondary | ICD-10-CM | POA: Insufficient documentation

## 2022-02-09 DIAGNOSIS — R457 State of emotional shock and stress, unspecified: Secondary | ICD-10-CM | POA: Diagnosis not present

## 2022-02-09 DIAGNOSIS — Z7984 Long term (current) use of oral hypoglycemic drugs: Secondary | ICD-10-CM | POA: Diagnosis not present

## 2022-02-09 HISTORY — DX: Disorder of kidney and ureter, unspecified: N28.9

## 2022-02-09 NOTE — ED Triage Notes (Signed)
Pt denies CP, blurred vision, or ShOB. Pt does state she is newly dx with kidney disease.

## 2022-02-09 NOTE — ED Triage Notes (Signed)
Pt BIB EMS from home, pt c/o hypertension and starting a new diabetes medication that made "her body feel like it was shutting down."

## 2022-02-10 DIAGNOSIS — I10 Essential (primary) hypertension: Secondary | ICD-10-CM | POA: Diagnosis not present

## 2022-02-10 LAB — CBC
HCT: 42.4 % (ref 36.0–46.0)
Hemoglobin: 13.6 g/dL (ref 12.0–15.0)
MCH: 26.6 pg (ref 26.0–34.0)
MCHC: 32.1 g/dL (ref 30.0–36.0)
MCV: 83 fL (ref 80.0–100.0)
Platelets: 331 10*3/uL (ref 150–400)
RBC: 5.11 MIL/uL (ref 3.87–5.11)
RDW: 13.5 % (ref 11.5–15.5)
WBC: 8.4 10*3/uL (ref 4.0–10.5)
nRBC: 0 % (ref 0.0–0.2)

## 2022-02-10 LAB — BASIC METABOLIC PANEL
Anion gap: 12 (ref 5–15)
BUN: 15 mg/dL (ref 8–23)
CO2: 24 mmol/L (ref 22–32)
Calcium: 9.8 mg/dL (ref 8.9–10.3)
Chloride: 103 mmol/L (ref 98–111)
Creatinine, Ser: 0.87 mg/dL (ref 0.44–1.00)
GFR, Estimated: >60 mL/min (ref >60)
Glucose, Bld: 178 mg/dL — ABNORMAL HIGH (ref 70–99)
Potassium: 3.2 mmol/L — ABNORMAL LOW (ref 3.5–5.1)
Sodium: 139 mmol/L (ref 135–145)

## 2022-02-10 MED ORDER — LABETALOL HCL 5 MG/ML IV SOLN
20.0000 mg | Freq: Once | INTRAVENOUS | Status: AC
  Administered 2022-02-10: 20 mg via INTRAVENOUS
  Filled 2022-02-10: qty 4

## 2022-02-10 NOTE — ED Notes (Signed)
DC papers reviewed. No questions or concerns. No signs of distress. Pt assisted to wheelchair and out to lobby. Appropriate measures for safety taken. 

## 2022-02-10 NOTE — Discharge Instructions (Signed)
Your Kidney Function (Creatinine) today was normal. This is great news!  Since you are no longer taking lisinopril, please increase your Coreg to 12.'5mg'$  (two tablets) twice a day for the next week and keep a log of your blood pressures at home. Follow up with your doctors next week for a recheck.

## 2022-02-10 NOTE — ED Provider Notes (Signed)
[facility] EMER[facility] DEPT  Provider Note  CSN: [id number] Arrival date & time: 02/09/22 2228  History Chief Complaint  Patient presents with   Hypertension    Cindy Wong is a 88 y.o. female with history of breast cancer, HTN and DM was at Oncology for regularly scheduled follow up 11/16 when labs showed a significant increase in Creatinine from prior baseline. She was also noted to have a new breast nodule. She was seen by PCP and then by Nephrology in the last couple of weeks and reports they stopped her lisinopril, still taking amlodipine. She also had her DM meds switched. She reports she has been feeling generally ill which she attributed to the new DM medication but today checked her BP at home and it was elevated. Rechecked a few hours later and her machine would not give a number. She is not having any chest pain, SOB, N/V or blurry vision.   Biopsy of breast nodule showed a recurrence of her cancer. That is still in the process of being evaluated by oncology, no new treatment plan has been discussed with her yet.    Home Medications Prior to Admission medications   Medication Sig Start Date End Date Taking? Authorizing Provider  amLODipine (NORVASC) 5 MG tablet Take 5 mg by mouth daily. In the morning    [provider]  carvedilol (COREG) 6.25 MG tablet TAKE 1 TABLET(6.25 MG) BY MOUTH TWICE DAILY 01/10/22   Patwardhan, Manish J, MD  Cholecalciferol (D2000 ULTRA STRENGTH) 50 MCG (2000 UT) CAPS Take by mouth daily.    [provider]  gabapentin (NEURONTIN) 100 MG capsule TAKE 1 CAPSULE(100 MG) BY MOUTH AT BEDTIME Patient not taking: Reported on 01/04/2022 02/15/21   Truitt Merle, MD  glipiZIDE (GLUCOTROL XL) 10 MG 24 hr tablet Take 10 mg by mouth daily.    [provider]  hydrochlorothiazide (HYDRODIURIL) 25 MG tablet Take 25 mg by mouth daily. In the morning 10/14/17   [provider]  linaGLIPtin-metFORMIN HCl 2.5-500 MG  TABS Take 1 tablet by mouth 2 (two) times daily.     [provider]  lisinopril (PRINIVIL,ZESTRIL) 40 MG tablet Take 40 mg by mouth daily. In the morning 11/09/16   [provider]  mirtazapine (REMERON) 7.5 MG tablet Take 7.5 mg by mouth at bedtime. Patient not taking: Reported on 01/04/2022 10/04/21   [provider]  Multiple Vitamins-Minerals (CENTRUM SILVER PO) Take 1 tablet by mouth daily.     [provider]  nitrofurantoin, macrocrystal-monohydrate, (MACROBID) 100 MG capsule Take 100 mg by mouth 2 (two) times daily. Patient not taking: Reported on 01/04/2022 10/12/21   [provider]  [facility] VERIO test strip 2 (two) times daily. for testing 08/11/17   [provider]  potassium chloride SA (K-DUR,KLOR-CON) 20 MEQ tablet Take 20 mEq by mouth daily.    [provider]  pravastatin (PRAVACHOL) 40 MG tablet Take 40 mg by mouth every evening.    [provider]  valACYclovir (VALTREX) 1000 MG tablet Take 1,000 mg by mouth 3 (three) times daily. 12/27/21   [provider]     Allergies    Sulfamethoxazole and Sulfonamide derivatives   Review of Systems   Review of Systems Please see HPI for pertinent positives and negatives  Physical Exam BP (!) 156/64   Pulse 68   Temp 98.3 F (36.8 C) (Oral)   Resp 16   Ht '5\' 8"'$  (1.727 m)   Wt 68 kg  SpO2 100%   BMI 22.81 kg/m   Physical Exam Vitals and nursing note reviewed.  Constitutional:      Appearance: Normal appearance.  HENT:     Head: Normocephalic and atraumatic.     Nose: Nose normal.     Mouth/Throat:     Mouth: Mucous membranes are moist.  Eyes:     Extraocular Movements: Extraocular movements intact.     Conjunctiva/sclera: Conjunctivae normal.  Cardiovascular:     Rate and Rhythm: Normal rate.  Pulmonary:     Effort: Pulmonary effort is normal.     Breath sounds: Normal breath sounds.  Abdominal:     General: Abdomen is flat.      Palpations: Abdomen is soft.     Tenderness: There is no abdominal tenderness.  Musculoskeletal:        General: No swelling. Normal range of motion.     Cervical back: Neck supple.  Skin:    General: Skin is warm and dry.  Neurological:     General: No focal deficit present.     Mental Status: She is alert.  Psychiatric:        Mood and Affect: Mood normal.     ED Results / Procedures / Treatments   EKG EKG Interpretation  Date/Time:  Friday February 09 2022 23:13:32 EST Ventricular Rate:  82 PR Interval:  174 QRS Duration: 82 QT Interval:  374 QTC Calculation: 436 R Axis:   57 Text Interpretation: Normal sinus rhythm Possible Left atrial enlargement Nonspecific ST abnormality Abnormal ECG When compared with ECG of 28-Nov-2016 21:37, PREVIOUS ECG IS PRESENT No significant change since last tracing Confirmed by Calvert Cantor [phone]) on 02/10/2022 12:39:50 AM  Procedures Procedures  Medications Ordered in the ED Medications  labetalol (NORMODYNE) injection 20 mg (20 mg Intravenous Given 02/10/22 0048)    Initial Impression and Plan  Patient here with HTN, significant increase from baseline but not having any symptoms. Recent labs concerning for a new CKD and meds have been changed including stopping her lisinopril. Will check labs here for acute change. Labetalol in the meantime.   ED Course   Clinical Course as of 02/10/22 0211  Sat Feb 10, 2022  0112 CBC is normal. BMP shows Creatinine has returned to baseline.  [CS]  0208 BP is improved. Further review of the EMR shows she is also taking Coreg 6.'25mg'$  from Cardiology. I recommend she increase the Coreg for the next few days, follow up with PCP/Neph/Cards for BP recheck next week. RTED for any other concerns. Patient is excited to hear her kidney function has returned to normal.  [CS]    Clinical Course User Index [CS] Truddie Hidden, MD     MDM Rules/Calculators/A&P Medical Decision Making Given presenting  complaint, I considered that admission might be necessary. After review of results from ED lab and/or imaging studies, admission to the hospital is not indicated at this time.    Amount and/or Complexity of Data Reviewed Labs: ordered. Decision-making details documented in ED Course.  Risk Prescription drug management. Decision regarding hospitalization.    Final Clinical Impression(s) / ED Diagnoses Final diagnoses:  Uncontrolled hypertension    Rx / DC Orders ED Discharge Orders     None        Truddie Hidden, MD 02/10/22 [phone]

## 2022-02-13 DIAGNOSIS — F331 Major depressive disorder, recurrent, moderate: Secondary | ICD-10-CM | POA: Diagnosis not present

## 2022-02-13 DIAGNOSIS — I10 Essential (primary) hypertension: Secondary | ICD-10-CM | POA: Diagnosis not present

## 2022-02-13 DIAGNOSIS — F411 Generalized anxiety disorder: Secondary | ICD-10-CM | POA: Diagnosis not present

## 2022-02-13 DIAGNOSIS — N289 Disorder of kidney and ureter, unspecified: Secondary | ICD-10-CM | POA: Diagnosis not present

## 2022-02-13 DIAGNOSIS — E119 Type 2 diabetes mellitus without complications: Secondary | ICD-10-CM | POA: Diagnosis not present

## 2022-02-14 ENCOUNTER — Telehealth: Payer: Self-pay | Admitting: Nurse Practitioner

## 2022-02-14 ENCOUNTER — Encounter: Payer: Self-pay | Admitting: Hematology

## 2022-02-14 DIAGNOSIS — C50912 Malignant neoplasm of unspecified site of left female breast: Secondary | ICD-10-CM

## 2022-02-14 NOTE — Telephone Encounter (Signed)
I reviewed path from left chest wall biopsy, which shows invasive ductal carcinoma, ER/PR + HER2- . Likely a recurrence from 2021. I recommend staging PET scan, given her recent AKI, to r/o metastatic disease. If PET is negative, I spoke to Dr. Georgette Dover who will see her to discuss excision. Will schedule her back to see [place] as well, pending his recommendations. Pt agrees with the plan and appreciates the call.   Cira Rue, NP

## 2022-02-20 ENCOUNTER — Telehealth: Payer: Self-pay

## 2022-02-20 NOTE — Telephone Encounter (Signed)
Pt was called on 02/20/2022 to be informed of a change of  schedule  for her PET scan . The original date was 1/17 , but needed a sooner date per Dr.Feng. I let the pt know that the date for her PET scan would be 03/02/2022 and she needs to arrive at 7:45 AM for check in , 8:00 AM for prep time ,and the actual appointment time is at 8:30 AM. The pt verbalize the date and time back to me. I asked her was there any questions that she may have for me and she stated no, before getting off the phone she repeated the date and time of her appointment  back to me and asked me what day the appointment was on. I let her know it was on a Friday.  Pt was ok with the change of date and time.

## 2022-02-21 ENCOUNTER — Telehealth: Payer: Self-pay

## 2022-02-21 DIAGNOSIS — R350 Frequency of micturition: Secondary | ICD-10-CM | POA: Diagnosis not present

## 2022-02-21 NOTE — Telephone Encounter (Signed)
Spoke with pt via telephone to inform pt that this RN rescheduled her f/u appt with Cira Rue, NP on 02/28/2022 to 03/05/2022 at 0900.  Informed pt that the f/u appt is being rescheduled until after the pt has completed her PET Scan so Lacie can go over the results with the pt in clinic.

## 2022-02-23 ENCOUNTER — Telehealth: Payer: Self-pay

## 2022-02-23 ENCOUNTER — Ambulatory Visit: Payer: Self-pay | Admitting: Surgery

## 2022-02-23 DIAGNOSIS — C50912 Malignant neoplasm of unspecified site of left female breast: Secondary | ICD-10-CM | POA: Diagnosis not present

## 2022-02-23 NOTE — Telephone Encounter (Signed)
        Patient  visited [facility] on 02/10/2022  for uncontrolled hypertension.   Telephone encounter attempt :  1st  A HIPAA compliant voice message was left requesting a return call.  Instructed patient to call back at [phone].   [place] Resource Care Guide   ??[email or web address]  ?? [phone]   Website: [email or web address]  [email or web address]

## 2022-02-24 DIAGNOSIS — I10 Essential (primary) hypertension: Secondary | ICD-10-CM | POA: Diagnosis not present

## 2022-02-26 ENCOUNTER — Telehealth: Payer: Self-pay

## 2022-02-26 NOTE — Telephone Encounter (Signed)
     Patient  visit on 02/10/2022  at [facility] was for uncontrolled hypertension.  Have you been able to follow up with your primary care physician? Yes  The patient was or was not able to obtain any needed medicine or equipment. No medications prescribed.  Are there diet recommendations that you are having difficulty following? No issues following with low sodium diet.  Patient expresses understanding of discharge instructions and education provided has no other needs at this time.    [place] Resource Care Guide   ??[email or web address]  ?? [phone]   Website: [email or web address]  [email or web address]

## 2022-02-28 ENCOUNTER — Ambulatory Visit: Payer: Medicare Other | Admitting: Nurse Practitioner

## 2022-03-02 ENCOUNTER — Ambulatory Visit (HOSPITAL_COMMUNITY)
Admission: RE | Admit: 2022-03-02 | Discharge: 2022-03-02 | Disposition: A | Discharge status: Home / Self Care | Payer: Medicare Other | Source: Ambulatory Visit | Attending: Nurse Practitioner | Admitting: Nurse Practitioner

## 2022-03-02 DIAGNOSIS — C50912 Malignant neoplasm of unspecified site of left female breast: Secondary | ICD-10-CM | POA: Diagnosis not present

## 2022-03-02 DIAGNOSIS — C50911 Malignant neoplasm of unspecified site of right female breast: Secondary | ICD-10-CM | POA: Diagnosis not present

## 2022-03-02 DIAGNOSIS — Z170 Estrogen receptor positive status [ER+]: Secondary | ICD-10-CM | POA: Insufficient documentation

## 2022-03-02 DIAGNOSIS — C761 Malignant neoplasm of thorax: Secondary | ICD-10-CM | POA: Insufficient documentation

## 2022-03-02 DIAGNOSIS — I700 Atherosclerosis of aorta: Secondary | ICD-10-CM | POA: Diagnosis not present

## 2022-03-02 DIAGNOSIS — I2510 Atherosclerotic heart disease of native coronary artery without angina pectoris: Secondary | ICD-10-CM | POA: Diagnosis not present

## 2022-03-02 LAB — GLUCOSE, CAPILLARY: Glucose-Capillary: 155 mg/dL — ABNORMAL HIGH (ref 70–99)

## 2022-03-02 MED ORDER — FLUDEOXYGLUCOSE F - 18 (FDG) INJECTION
7.5000 | Freq: Once | INTRAVENOUS | Status: AC
  Administered 2022-03-02: 7.47 via INTRAVENOUS

## 2022-03-04 NOTE — Progress Notes (Unsigned)
Patient Care Team: Wong, Cindy Him, MD as PCP - Cindy Wong, Cindy Blanch, RN as Oncology Nurse Navigator Cindy Germany, RN as Oncology Nurse Navigator Cindy Merle, MD as Consulting Physician (Hematology) Cindy Feeling, NP as Nurse Practitioner (Nurse Practitioner) Cindy Mesa, MD as Consulting Physician (General Surgery) [place], [facility] as Consulting Physician (Nephrology)   CHIEF COMPLAINT: Follow up recurrent breast cancer in subQ tissue; PET results   Oncology History Overview Note  Cancer Staging Malignant neoplasm of central left breast [facility]) Staging form: Breast, AJCC 8th Edition - Clinical stage from 11/19/2019: Stage IB (cT2, cN0, cM0, G2, ER+, PR+, HER2-) - Signed by Cindy Feeling, NP on 11/23/2019 - Pathologic stage from 12/08/2019: Stage Unknown (pT2, pNX, cM0, G2, ER+, PR+, HER2-) - Signed by Cindy Merle, MD on 12/25/2019    Malignant neoplasm of central left breast ([place])  11/09/2019 Breast US   IMPRESSION: 1. Highly suspicious left breast mass at the 12 o'clock position 4 cm from the nipple. Recommendation is for ultrasound-guided biopsy. 2. Indeterminate hyperechoic mass at the 12 o'clock position 4 cm from the nipple, just superior to the index lesion. This may correspond with an additional asymmetry seen mammographically. Recommendation is for ultrasound-guided biopsy with close attention on post clip films to ensure mammographic correlation. 3. No suspicious left axillary lymphadenopathy. 4. No suspicious findings at the site of the right mastectomy bed palpable lump.   11/19/2019 Cancer Staging   Staging form: Breast, AJCC 8th Edition - Clinical stage from 11/19/2019: Stage IB (cT2, cN0, cM0, G2, ER+, PR+, HER2-) - Signed by Cindy Feeling, NP on 11/23/2019   11/19/2019 Initial Biopsy   Diagnosis Breast, left, needle core biopsy, 12 o'clock - INVASIVE MAMMARY CARCINOMA - SEE COMMENT E-cadherin is POSITIVE supporting a ductal  origin. PROGNOSTIC INDICATORS Results: IMMUNOHISTOCHEMICAL AND MORPHOMETRIC ANALYSIS PERFORMED MANUALLY The tumor cells are NEGATIVE for Her2 (1+). Estrogen Receptor: 95%, POSITIVE, STRONG STAINING INTENSITY Progesterone Receptor: 95%, POSITIVE, STRONG STAINING INTENSITY Proliferation Marker Ki67: 10%   11/23/2019 Initial Diagnosis   Malignant neoplasm of left breast ([place])   11/29/2019 Breast MRI   IMPRESSION: 1. 3.1 cm biopsy-proven malignancy within the UPPER LEFT breast. 2. Indeterminate 0.9 cm posterior central/LOWER LEFT breast mass. If breast conservation is desired, 2nd-look ultrasound and possible biopsy is recommended. If this mass is not identified sonographically, than MR guided biopsy would be recommended. 3. No abnormal appearing lymph nodes. 4. RIGHT mastectomy without suspicious abnormalities in the RIGHT mastectomy bed.   12/08/2019 Cancer Staging   Staging form: Breast, AJCC 8th Edition - Pathologic stage from 12/08/2019: Stage Unknown (pT2, pNX, cM0, G2, ER+, PR+, HER2-) - Signed by Cindy Merle, MD on 12/25/2019   12/08/2019 Surgery   Left Mastectomy by Dr Cindy Wong   12/08/2019 Pathology Results   FINAL MICROSCOPIC DIAGNOSIS:   A. BREAST, LEFT, MASTECTOMY:  - Invasive ductal carcinoma, multifocal, 2.8 cm in greatest dimension,  Nottingham grade 2 of 3.  - Margins of resection are not involved.  - See oncology table.  - See oncology table.    03/2020 -  Anti-estrogen oral therapy   Anastrozole 21m once daily starting 01/2020   03/22/2020 Survivorship   SCP delivered by LCira Rue NP       CURRENT THERAPY: PENDING surgical excision  INTERVAL HISTORY Ms. WPurringtonreturns for follow up as scheduled. Last seen by me 01/04/22, exam showed new nodularity in the left subQ tissue above scar. F/up mammo/US was suspicious, path confirmed recurrent  ER/PR + HER2 - IDC breast cancer. She is scheduled for surgical excision with Dr. Georgette Wong on Wednesday. Here to review staging  PET scan.   ROS   Past Medical History:  Diagnosis Date   Adenomatous colon polyp    tubular   Anxiety    Arthritis    Blood transfusion without reported diagnosis    Breast cancer ([place]) 2007   right mastectomy   Breast cancer ([place]) 10/2019   left breast IDC   Cataract    Depression    Diabetes mellitus    Hx of meningitis 2012   Hyperlipemia    Hypertension    Personal history of radiation therapy    Renal disorder    Stomach cancer ([place]) 1997   partial gastrectomy     Past Surgical History:  Procedure Laterality Date   ABDOMINAL HYSTERECTOMY  1974   Supracervical   BREAST BIOPSY Left 02/06/2022   [place] LT BREAST BX W LOC DEV 1ST LESION IMG BX SPEC US GUIDE 02/06/2022 [facility] MAMMOGRAPHY   BREAST LUMPECTOMY Right 1993   BREAST SURGERY     Mastectomy   CHOLECYSTECTOMY     COLONOSCOPY     MASTECTOMY Right    MASTECTOMY, PARTIAL Left 12/08/2019   Procedure: LEFT MASTECTOMY;  Surgeon: Cindy Mesa, MD;  Location: [facility];  Service: General;  Laterality: Left;  LMA AND PECTORAL BLOCK   meningitis     OOPHORECTOMY     One ovary removed--?side   Stomach tumor     UPPER GASTROINTESTINAL ENDOSCOPY       Outpatient Encounter Medications as of 03/05/2022  Medication Sig   amLODipine (NORVASC) 5 MG tablet Take 10 mg by mouth daily.   betamethasone dipropionate 0.05 % cream Apply 1 Application topically 2 (two) times daily as needed (psoriasis).   carvedilol (COREG) 6.25 MG tablet TAKE 1 TABLET(6.25 MG) BY MOUTH TWICE DAILY (Patient taking differently: Take 12.5 mg by mouth 2 (two) times daily.)   Cholecalciferol (D2000 ULTRA STRENGTH) 50 MCG (2000 UT) CAPS Take 2,000 Units by mouth daily.   glipiZIDE (GLUCOTROL XL) 10 MG 24 hr tablet Take 10 mg by mouth daily.   hydrochlorothiazide (HYDRODIURIL) 25 MG tablet Take 25 mg by mouth daily. In the morning   metFORMIN (GLUCOPHAGE) 500 MG tablet Take 500 mg by mouth daily with breakfast.   mirtazapine (REMERON)  7.5 MG tablet Take 7.5 mg by mouth at bedtime.   Multiple Vitamins-Minerals (CENTRUM SILVER PO) Take 1 tablet by mouth daily.    ONETOUCH VERIO test strip 2 (two) times daily. for testing   potassium chloride SA (K-DUR,KLOR-CON) 20 MEQ tablet Take 20 mEq by mouth daily.   pravastatin (PRAVACHOL) 40 MG tablet Take 40 mg by mouth every evening.   No facility-administered encounter medications on file as of 03/05/2022.     There were no vitals filed for this visit. There is no height or weight on file to calculate BMI.   PHYSICAL EXAM GENERAL:alert, no distress and comfortable SKIN: no rash  EYES: sclera clear NECK: without mass LYMPH:  no palpable cervical or supraclavicular lymphadenopathy  LUNGS: clear with normal breathing effort HEART: regular rate & rhythm, no lower extremity edema ABDOMEN: abdomen soft, non-tender and normal bowel sounds NEURO: alert & oriented x 3 with fluent speech, no focal motor/sensory deficits Breast exam:  PAC without erythema    CBC    Component Value Date/Time   WBC 8.4 02/10/2022 0030   RBC 5.11 02/10/2022 0030  HGB 13.6 02/10/2022 0030   HGB 15.9 11/05/2016 1040   HCT 42.4 02/10/2022 0030   HCT 48.9 (H) 11/05/2016 1040   PLT 331 02/10/2022 0030   PLT 281 11/05/2016 1040   MCV 83.0 02/10/2022 0030   MCV 82.9 11/05/2016 1040   MCH 26.6 02/10/2022 0030   MCHC 32.1 02/10/2022 0030   RDW 13.5 02/10/2022 0030   RDW 13.8 11/05/2016 1040   LYMPHSABS 1.6 01/04/2022 0853   LYMPHSABS 2.4 11/05/2016 1040   MONOABS 0.9 01/04/2022 0853   MONOABS 0.5 11/05/2016 1040   EOSABS 0.1 01/04/2022 0853   EOSABS 0.1 11/05/2016 1040   BASOSABS 0.1 01/04/2022 0853   BASOSABS 0.1 11/05/2016 1040     CMP     Component Value Date/Time   NA 139 02/10/2022 0030   NA 142 11/05/2016 1040   K 3.2 (L) 02/10/2022 0030   K 4.1 11/05/2016 1040   CL 103 02/10/2022 0030   CL 109 (H) 10/23/2011 1527   CO2 24 02/10/2022 0030   CO2 25 11/05/2016 1040   GLUCOSE 178  (H) 02/10/2022 0030   GLUCOSE 127 11/05/2016 1040   GLUCOSE 107 (H) 10/23/2011 1527   BUN 15 02/10/2022 0030   BUN 17.9 11/05/2016 1040   CREATININE 0.87 02/10/2022 0030   CREATININE 0.9 11/05/2016 1040   CALCIUM 9.8 02/10/2022 0030   CALCIUM 10.3 11/05/2016 1040   PROT 6.9 01/04/2022 0853   PROT 7.6 11/05/2016 1040   ALBUMIN 4.0 01/04/2022 0853   ALBUMIN 4.1 11/05/2016 1040   AST 18 01/04/2022 0853   AST 21 11/05/2016 1040   ALT 10 01/04/2022 0853   ALT 17 11/05/2016 1040   ALKPHOS 43 01/04/2022 0853   ALKPHOS 55 11/05/2016 1040   BILITOT 0.4 01/04/2022 0853   BILITOT 0.61 11/05/2016 1040   GFRNONAA >60 02/10/2022 0030   GFRAA >60 11/23/2019 1049     ASSESSMENT & PLAN:  PLAN:  No orders of the defined types were placed in this encounter.     All questions were answered. The patient knows to call the clinic with any problems, questions or concerns. No barriers to learning were detected. I spent *** counseling the patient face to face. The total time spent in the appointment was *** and more than 50% was on counseling, review of test results, and coordination of care.   Cindy Rue, NP-C _0 @

## 2022-03-05 ENCOUNTER — Inpatient Hospital Stay: Payer: Medicare Other

## 2022-03-05 ENCOUNTER — Inpatient Hospital Stay: Payer: Medicare Other | Attending: Nurse Practitioner | Admitting: Nurse Practitioner

## 2022-03-05 ENCOUNTER — Encounter (HOSPITAL_COMMUNITY): Payer: Self-pay | Admitting: Surgery

## 2022-03-05 ENCOUNTER — Encounter: Payer: Self-pay | Admitting: Nurse Practitioner

## 2022-03-05 ENCOUNTER — Other Ambulatory Visit: Payer: Self-pay

## 2022-03-05 VITALS — BP 136/62 | HR 75 | Temp 97.8°F | Resp 15 | Wt 149.6 lb

## 2022-03-05 DIAGNOSIS — N6489 Other specified disorders of breast: Secondary | ICD-10-CM | POA: Insufficient documentation

## 2022-03-05 DIAGNOSIS — Z90710 Acquired absence of both cervix and uterus: Secondary | ICD-10-CM | POA: Insufficient documentation

## 2022-03-05 DIAGNOSIS — Z85028 Personal history of other malignant neoplasm of stomach: Secondary | ICD-10-CM | POA: Diagnosis not present

## 2022-03-05 DIAGNOSIS — Z90721 Acquired absence of ovaries, unilateral: Secondary | ICD-10-CM | POA: Diagnosis not present

## 2022-03-05 DIAGNOSIS — Z8042 Family history of malignant neoplasm of prostate: Secondary | ICD-10-CM | POA: Insufficient documentation

## 2022-03-05 DIAGNOSIS — Z170 Estrogen receptor positive status [ER+]: Secondary | ICD-10-CM | POA: Diagnosis not present

## 2022-03-05 DIAGNOSIS — Z853 Personal history of malignant neoplasm of breast: Secondary | ICD-10-CM | POA: Insufficient documentation

## 2022-03-05 DIAGNOSIS — Z923 Personal history of irradiation: Secondary | ICD-10-CM | POA: Insufficient documentation

## 2022-03-05 DIAGNOSIS — Z9013 Acquired absence of bilateral breasts and nipples: Secondary | ICD-10-CM | POA: Diagnosis not present

## 2022-03-05 DIAGNOSIS — Z79899 Other long term (current) drug therapy: Secondary | ICD-10-CM | POA: Diagnosis not present

## 2022-03-05 DIAGNOSIS — C50912 Malignant neoplasm of unspecified site of left female breast: Secondary | ICD-10-CM | POA: Diagnosis not present

## 2022-03-05 DIAGNOSIS — Z903 Acquired absence of stomach [part of]: Secondary | ICD-10-CM | POA: Insufficient documentation

## 2022-03-05 DIAGNOSIS — C50812 Malignant neoplasm of overlapping sites of left female breast: Secondary | ICD-10-CM | POA: Insufficient documentation

## 2022-03-05 DIAGNOSIS — I10 Essential (primary) hypertension: Secondary | ICD-10-CM | POA: Diagnosis not present

## 2022-03-05 DIAGNOSIS — Z9049 Acquired absence of other specified parts of digestive tract: Secondary | ICD-10-CM | POA: Insufficient documentation

## 2022-03-05 DIAGNOSIS — C49A2 Gastrointestinal stromal tumor of stomach: Secondary | ICD-10-CM

## 2022-03-05 NOTE — Progress Notes (Signed)
Anesthesia Chart Review: Same day workup  Follows with cardiology for history of hypertension, hyperlipidemia, frequent PVCs/ventricular bigeminy, abnormal stress test.  Stress test 09/2018 showed a very small sized, mild intensity, fixed perfusion defect in the basal inferoseptal myocardium.  Echocardiogram showed structurally normal heart.  In follow-up with Dr. Virgina Jock 10/16/2018 the patient was noted to have no anginal symptoms and was started on metoprolol, which was subsequently changed to carvedilol for better blood pressure control.  On subsequent follow-up was noted that her PVCs were improved on carvedilol.  She was last seen by Lawerance Cruel, PA-C on 04/27/2021 was noted to be stable from a cardiovascular standpoint.  BP was elevated in the office at 160/84, however, it was noted that she is enrolled in remote monitoring and her home blood pressures have been well-controlled.  No anginal symptoms.  No changes made to management.  History of right breast cancer s/p mastectomy 2017 left breast cancer s/p mastectomy 2021.  Recently found to have recurrence of invasive ductal carcinoma and subcutaneous tissue over left chest wall.  Dr. Georgette Dover recommended wide excision of this recurrent nodule.    History of partial gastrectomy in 1997 for a GI stromal tumor.  Non-insulin DM 2, no recent A1c on file.  CBC and BMP 02/10/2022 reviewed, potassium mildly low at 3.2, otherwise unremarkable.  Patient will need to have surgery evaluation.  EKG 02/09/2022: NSR.  Rate 82.  Possible LAE.  Nonspecific ST abnormality.   Echocardiogram 08/09/2020:  Left ventricle cavity is normal in size and wall thickness. Normal global  wall motion. Normal LV systolic function with EF 63%. Doppler evidence of  grade I (impaired) diastolic dysfunction, normal LAP.  Trace aortic valve stenosis.  Mild mitral leaflet thickening. No significant stenosis or regurgitation.  Mild tricuspid regurgitation.  Mild pulmonic  regurgitation.  No significant change compared to previous study on 10/16/2018.   ABI 05/13/2020:  This exam reveals moderately decreased perfusion of the right lower extremity, noted at the anterior tibial and post tibial artery level (ABI 0.79) and moderately decreased perfusion of the left lower extremity, noted at the post tibial artery level (ABI 0.79). There is mildly abnormal biphasic waveform pattern at the level of the ankles.   EKG 04/27/2020: Sinus rhythm 74 bpm Left atrial enlargement.  Nonspecific T wave abnormality Compared to previous EKG's, PVCs now absent   Echocardiogram 10/16/2018: Left ventricle cavity is normal in size. Normal left ventricular wall thickness. Normal LV systolic function with EF 55%. Normal global wall motion. Diastolic function assessment limited due to frequent PVC's. Calculated EF 55%. Trileaflet aortic valve with mild thickening. Trace aortic stenosis. No regurgitation noted. Mild tricuspid regurgitation. Trace pulmonic regurgitation.  No evidence of pulmonary hypertension.   Lexiscan Myoview stress test 09/29/2018: Lexiscan stress test was performed. Stress EKG is non-diagnostic, as this is pharmacological stress test. In addition, rest and stress EKG demonstrate sinus rhythm, frequent PVC's, inferolateral nonspecific ST-T changes.  LVEF 63%. SPECT stress and rest images demonstrate very small size, mild intensity, fixed perfusion defect in basal inferoseptal myocardium. While the defect is small, TID of 1.31 with regional perfusion defect is considered high risk finding. Clinical correlation recommended.    EKG 09/15/2018: Sinus rhythm 77 bpm. Frequent PVC   Carotid US 08/14/2018: Right Carotid: Velocities in the right ICA are consistent with a 1-39% stenosis.                Non-hemodynamically significant plaque <50% noted in the CCA. The  ECA appears <50% stenosed.   Left Carotid: Velocities in the left ICA are consistent with a  1-39% stenosis.               Non-hemodynamically significant plaque noted in the CCA.               Heterogenous multinodular thyroid gland incidentally noted.   Wynonia Musty [facility] Short Stay Center/Anesthesiology Phone [phone] 03/05/2022 12:25 PM

## 2022-03-05 NOTE — Anesthesia Preprocedure Evaluation (Addendum)
Anesthesia Evaluation  Patient identified by MRN, date of birth, ID band Patient awake    Reviewed: Allergy & Precautions, NPO status , Patient's Chart, lab work & pertinent test results  Airway Mallampati: II  TM Distance: >3 FB Neck ROM: Full    Dental  (+) Missing,    Pulmonary    breath sounds clear to auscultation       Cardiovascular hypertension, Pt. on medications and Pt. on home beta blockers  Rhythm:Regular Rate:Normal     Neuro/Psych  PSYCHIATRIC DISORDERS Anxiety Depression    negative neurological ROS     GI/Hepatic negative GI ROS, Neg liver ROS,,,  Endo/Other  diabetes, Type 2, Oral Hypoglycemic Agents    Renal/GU Renal disease     Musculoskeletal  (+) Arthritis ,    Abdominal   Peds  Hematology negative hematology ROS (+)   Anesthesia Other Findings   Reproductive/Obstetrics                             Anesthesia Physical Anesthesia Plan  ASA: 3  Anesthesia Plan: General   Post-op Pain Management: Tylenol PO (pre-op)*   Induction: Intravenous, Rapid sequence and Cricoid pressure planned  PONV Risk Score and Plan: 4 or greater and Ondansetron and Treatment may vary due to age or medical condition  Airway Management Planned: Oral ETT and LMA  Additional Equipment: None  Intra-op Plan:   Post-operative Plan: Extubation in OR  Informed Consent: I have reviewed the patients History and Physical, chart, labs and discussed the procedure including the risks, benefits and alternatives for the proposed anesthesia with the patient or authorized representative who has indicated his/her understanding and acceptance.     Dental advisory given  Plan Discussed with: CRNA  Anesthesia Plan Comments: (PAT note by Karoline Caldwell, PA-C: Follows with cardiology for history of hypertension, hyperlipidemia, frequent PVCs/ventricular bigeminy, abnormal stress test.  Stress test  09/2018 showed a very small sized, mild intensity, fixed perfusion defect in the basal inferoseptal myocardium.  Echocardiogram showed structurally normal heart.  In follow-up with Dr. Virgina Jock 10/16/2018 the patient was noted to have no anginal symptoms and was started on metoprolol, which was subsequently changed to carvedilol for better blood pressure control.  On subsequent follow-up was noted that her PVCs were improved on carvedilol.  She was last seen by Lawerance Cruel, PA-C on 04/27/2021 was noted to be stable from a cardiovascular standpoint.  BP was elevated in the office at 160/84, however, it was noted that she is enrolled in remote monitoring and her home blood pressures have been well-controlled.  No anginal symptoms.  No changes made to management.  History of right breast cancer s/p mastectomy 2017 left breast cancer s/p mastectomy 2021.  Recently found to have recurrence of invasive ductal carcinoma and subcutaneous tissue over left chest wall.  Dr. Georgette Dover recommended wide excision of this recurrent nodule.    History of partial gastrectomy in 1997 for a GI stromal tumor.  Non-insulin DM 2, no recent A1c on file.  CBC and BMP 02/10/2022 reviewed, potassium mildly low at 3.2, otherwise unremarkable.  Patient will need to have surgery evaluation.  EKG 02/09/2022: NSR.  Rate 82.  Possible LAE.  Nonspecific ST abnormality.  Echocardiogram 08/09/2020:  Left ventricle cavity is normal in size and wall thickness. Normal global  wall motion. Normal LV systolic function with EF 63%. Doppler evidence of  grade I (impaired) diastolic dysfunction, normal LAP.  Trace aortic  valve stenosis.  Mild mitral leaflet thickening. No significant stenosis or regurgitation.  Mild tricuspid regurgitation.  Mild pulmonic regurgitation.  No significant change compared to previous study on 10/16/2018.  ABI 05/13/2020:  This exam reveals moderately decreased perfusion of the right lower extremity, noted  at the anterior tibial and post tibial artery level (ABI 0.79) and moderately decreased perfusion of the left lower extremity, noted at the post tibial artery level (ABI 0.79). There is mildly abnormal biphasic waveform pattern at the level of the ankles.  EKG 04/27/2020: Sinus rhythm 74 bpm Left atrial enlargement.  Nonspecific T wave abnormality Compared to previous EKG's, PVCs now absent  Echocardiogram 10/16/2018: Left ventricle cavity is normal in size. Normal left ventricular wall thickness. Normal LV systolic function with EF 55%. Normal global wall motion. Diastolic function assessment limited due to frequent PVC's. Calculated EF 55%. Trileaflet aortic valve with mild thickening. Trace aortic stenosis. No regurgitation noted. Mild tricuspid regurgitation. Trace pulmonic regurgitation.  No evidence of pulmonary hypertension.  Lexiscan Myoview stress test 09/29/2018: Lexiscan stress test was performed. Stress EKG is non-diagnostic, as this is pharmacological stress test. In addition, rest and stress EKG demonstrate sinus rhythm, frequent PVC's, inferolateral nonspecific ST-T changes.  LVEF 63%. SPECT stress and rest images demonstrate very small size, mild intensity, fixed perfusion defect in basal inferoseptal myocardium. While the defect is small, TID of 1.31 with regional perfusion defect is considered high risk finding. Clinical correlation recommended.   EKG 09/15/2018: Sinus rhythm 77 bpm. Frequent PVC  Carotid US 08/14/2018: Right Carotid: Velocities in the right ICA are consistent with a 1-39% stenosis. Non-hemodynamically significant plaque <50% noted in the CCA. The ECA appears <50% stenosed.  Left Carotid: Velocities in the left ICA are consistent with a 1-39% stenosis. Non-hemodynamically significant plaque noted in the CCA. Heterogenous multinodular thyroid gland incidentally noted.   )         Anesthesia Quick Evaluation

## 2022-03-05 NOTE — Progress Notes (Addendum)
PCP - Dr. Domenick Gong  EKG - 02/09/22 Stress Test - 09/29/18 ECHO - 08/09/20  Fasting Blood Sugar - 125-130 Checks Blood Sugar 1-2/day  ERAS Protcol - Clears until 1000  Anesthesia review: Y  Patient verbally denies any shortness of breath, fever, cough and chest pain during phone call   -------------  SDW INSTRUCTIONS given:  Your procedure is scheduled on 03/06/21.  Report to [facility] Main Entrance "A" at 1030 A.M., and check in at the Admitting office.  Call this number if you have problems the morning of surgery:  [phone]   Remember:  Do not eat after midnight the night before your surgery  You may drink clear liquids until 1000 the morning of your surgery.   Clear liquids allowed are: Water, Non-Citrus Juices (without pulp), Carbonated Beverages, Clear Tea, Black Coffee Only, and Gatorade    Take these medicines the morning of surgery with A SIP OF WATER  amLODipine (NORVASC)  carvedilol (COREG)   **DO NOT take metformin and glipizide the morning of surgery**  ** PLEASE check your blood sugar the morning of your surgery when you wake up and every 2 hours until you get to the Short Stay unit.  If your blood sugar is less than 70 mg/dL, you will need to treat for low blood sugar: Do not take insulin. Treat a low blood sugar (less than 70 mg/dL) with  cup of clear juice (cranberry or apple), 4 glucose tablets, OR glucose gel. Recheck blood sugar in 15 minutes after treatment (to make sure it is greater than 70 mg/dL). If your blood sugar is not greater than 70 mg/dL on recheck, call [phone] for further instructions.  As of today, STOP taking any Aspirin (unless otherwise instructed by your surgeon) Aleve, Naproxen, Ibuprofen, Motrin, Advil, Goody's, BC's, all herbal medications, fish oil, and all vitamins.                      Do not wear jewelry, make up, or nail polish            Do not wear lotions, powders, perfumes/colognes, or deodorant.             Do not shave 48 hours prior to surgery.  Men may shave face and neck.            Do not bring valuables to the hospital.            [facility] is not responsible for any belongings or valuables.  Do NOT Smoke (Tobacco/Vaping) 24 hours prior to your procedure If you use a CPAP at night, you may bring all equipment for your overnight stay.   Contacts, glasses, dentures or bridgework may not be worn into surgery.      For patients admitted to the hospital, discharge time will be determined by your treatment team.   Patients discharged the day of surgery will not be allowed to drive home, and someone needs to stay with them for 24 hours.    Special instructions:   [facility]- Preparing For Surgery  Before surgery, you can play an important role. Because skin is not sterile, your skin needs to be as free of germs as possible. You can reduce the number of germs on your skin by washing with CHG (chlorahexidine gluconate) Soap before surgery.  CHG is an antiseptic cleaner which kills germs and bonds with the skin to continue killing germs even after washing.    Oral Hygiene is  also important to reduce your risk of infection.  Remember - BRUSH YOUR TEETH THE MORNING OF SURGERY WITH YOUR REGULAR TOOTHPASTE  Please do not use if you have an allergy to CHG or antibacterial soaps. If your skin becomes reddened/irritated stop using the CHG.  Do not shave (including legs and underarms) for at least 48 hours prior to first CHG shower. It is OK to shave your face.  Please follow these instructions carefully.   Shower the NIGHT BEFORE SURGERY and the MORNING OF SURGERY with DIAL Soap.   Pat yourself dry with a CLEAN TOWEL.  Wear CLEAN PAJAMAS to bed the night before surgery  Place CLEAN SHEETS on your bed the night of your first shower and DO NOT SLEEP WITH PETS.   Day of Surgery: Please shower morning of surgery  Wear Clean/Comfortable clothing the morning of surgery Do not apply any  deodorants/lotions.   Remember to brush your teeth WITH YOUR REGULAR TOOTHPASTE.   Questions were answered. Patient verbalized understanding of instructions.

## 2022-03-06 ENCOUNTER — Ambulatory Visit (HOSPITAL_COMMUNITY): Payer: Medicare Other | Admitting: Physician Assistant

## 2022-03-06 ENCOUNTER — Ambulatory Visit (HOSPITAL_COMMUNITY)
Admission: RE | Admit: 2022-03-06 | Discharge: 2022-03-06 | Disposition: A | Discharge status: Home / Self Care | Payer: Medicare Other | Attending: Surgery | Admitting: Surgery

## 2022-03-06 ENCOUNTER — Encounter (HOSPITAL_COMMUNITY)
Admission: RE | Disposition: A | Discharge status: Home / Self Care | Payer: Self-pay | Source: Home / Self Care | Attending: Surgery

## 2022-03-06 ENCOUNTER — Encounter (HOSPITAL_COMMUNITY): Payer: Self-pay | Admitting: Surgery

## 2022-03-06 ENCOUNTER — Ambulatory Visit (HOSPITAL_BASED_OUTPATIENT_CLINIC_OR_DEPARTMENT_OTHER): Payer: Medicare Other | Admitting: Physician Assistant

## 2022-03-06 ENCOUNTER — Other Ambulatory Visit: Payer: Self-pay

## 2022-03-06 DIAGNOSIS — I10 Essential (primary) hypertension: Secondary | ICD-10-CM | POA: Diagnosis not present

## 2022-03-06 DIAGNOSIS — F32A Depression, unspecified: Secondary | ICD-10-CM | POA: Insufficient documentation

## 2022-03-06 DIAGNOSIS — Z923 Personal history of irradiation: Secondary | ICD-10-CM | POA: Diagnosis not present

## 2022-03-06 DIAGNOSIS — Z79899 Other long term (current) drug therapy: Secondary | ICD-10-CM | POA: Diagnosis not present

## 2022-03-06 DIAGNOSIS — E119 Type 2 diabetes mellitus without complications: Secondary | ICD-10-CM

## 2022-03-06 DIAGNOSIS — Z9013 Acquired absence of bilateral breasts and nipples: Secondary | ICD-10-CM | POA: Diagnosis not present

## 2022-03-06 DIAGNOSIS — F419 Anxiety disorder, unspecified: Secondary | ICD-10-CM | POA: Insufficient documentation

## 2022-03-06 DIAGNOSIS — M1990 Unspecified osteoarthritis, unspecified site: Secondary | ICD-10-CM | POA: Diagnosis not present

## 2022-03-06 DIAGNOSIS — Z85028 Personal history of other malignant neoplasm of stomach: Secondary | ICD-10-CM | POA: Insufficient documentation

## 2022-03-06 DIAGNOSIS — D0512 Intraductal carcinoma in situ of left breast: Secondary | ICD-10-CM | POA: Diagnosis not present

## 2022-03-06 DIAGNOSIS — C50912 Malignant neoplasm of unspecified site of left female breast: Secondary | ICD-10-CM

## 2022-03-06 DIAGNOSIS — E785 Hyperlipidemia, unspecified: Secondary | ICD-10-CM | POA: Diagnosis not present

## 2022-03-06 DIAGNOSIS — Z7984 Long term (current) use of oral hypoglycemic drugs: Secondary | ICD-10-CM | POA: Diagnosis not present

## 2022-03-06 DIAGNOSIS — C792 Secondary malignant neoplasm of skin: Secondary | ICD-10-CM | POA: Diagnosis not present

## 2022-03-06 DIAGNOSIS — Z170 Estrogen receptor positive status [ER+]: Secondary | ICD-10-CM | POA: Diagnosis not present

## 2022-03-06 DIAGNOSIS — Z903 Acquired absence of stomach [part of]: Secondary | ICD-10-CM | POA: Diagnosis not present

## 2022-03-06 DIAGNOSIS — Z79811 Long term (current) use of aromatase inhibitors: Secondary | ICD-10-CM | POA: Diagnosis not present

## 2022-03-06 DIAGNOSIS — C50919 Malignant neoplasm of unspecified site of unspecified female breast: Secondary | ICD-10-CM | POA: Diagnosis not present

## 2022-03-06 DIAGNOSIS — Z833 Family history of diabetes mellitus: Secondary | ICD-10-CM | POA: Diagnosis not present

## 2022-03-06 HISTORY — PX: BREAST CYST EXCISION: SHX579

## 2022-03-06 LAB — GLUCOSE, CAPILLARY
Glucose-Capillary: 171 mg/dL — ABNORMAL HIGH (ref 70–99)
Glucose-Capillary: 135 mg/dL — ABNORMAL HIGH (ref 70–99)

## 2022-03-06 LAB — POCT I-STAT, CHEM 8
BUN: 16 mg/dL (ref 8–23)
Calcium, Ion: 1.1 mmol/L — ABNORMAL LOW (ref 1.15–1.40)
Chloride: 104 mmol/L (ref 98–111)
Creatinine, Ser: 0.7 mg/dL (ref 0.44–1.00)
Glucose, Bld: 177 mg/dL — ABNORMAL HIGH (ref 70–99)
HCT: 41 % (ref 36.0–46.0)
Hemoglobin: 13.9 g/dL (ref 12.0–15.0)
Potassium: 3.8 mmol/L (ref 3.5–5.1)
Sodium: 142 mmol/L (ref 135–145)
TCO2: 24 mmol/L (ref 22–32)

## 2022-03-06 SURGERY — EXCISION, CYST, BREAST
Anesthesia: General | Site: Chest | Laterality: Left

## 2022-03-06 MED ORDER — ONDANSETRON HCL 4 MG/2ML IJ SOLN
INTRAMUSCULAR | Status: DC | PRN
  Administered 2022-03-06: 4 mg via INTRAVENOUS

## 2022-03-06 MED ORDER — BUPIVACAINE HCL (PF) 0.25 % IJ SOLN
INTRAMUSCULAR | Status: DC | PRN
  Administered 2022-03-06: 17 mL

## 2022-03-06 MED ORDER — CEFAZOLIN SODIUM-DEXTROSE 2-4 GM/100ML-% IV SOLN
2.0000 g | INTRAVENOUS | Status: AC
  Administered 2022-03-06: 2 g via INTRAVENOUS
  Filled 2022-03-06: qty 100

## 2022-03-06 MED ORDER — 0.9 % SODIUM CHLORIDE (POUR BTL) OPTIME
TOPICAL | Status: DC | PRN
  Administered 2022-03-06: 1000 mL

## 2022-03-06 MED ORDER — FENTANYL CITRATE (PF) 250 MCG/5ML IJ SOLN
INTRAMUSCULAR | Status: DC | PRN
  Administered 2022-03-06 (×2): 50 ug via INTRAVENOUS

## 2022-03-06 MED ORDER — TRAMADOL HCL 50 MG PO TABS: 50.0000 mg | ORAL_TABLET | Freq: Four times a day (QID) | ORAL | 0 refills | Status: AC | PRN

## 2022-03-06 MED ORDER — CHLORHEXIDINE GLUCONATE CLOTH 2 % EX PADS: 6.0000 | MEDICATED_PAD | Freq: Once | CUTANEOUS | Status: DC

## 2022-03-06 MED ORDER — CHLORHEXIDINE GLUCONATE 0.12 % MT SOLN: 15.0000 mL | OROMUCOSAL | Status: AC

## 2022-03-06 MED ORDER — PHENYLEPHRINE 80 MCG/ML (10ML) SYRINGE FOR IV PUSH (FOR BLOOD PRESSURE SUPPORT)
PREFILLED_SYRINGE | INTRAVENOUS | Status: AC
  Filled 2022-03-06: qty 10

## 2022-03-06 MED ORDER — BUPIVACAINE HCL (PF) 0.25 % IJ SOLN
INTRAMUSCULAR | Status: AC
  Filled 2022-03-06: qty 30

## 2022-03-06 MED ORDER — CHLORHEXIDINE GLUCONATE 0.12 % MT SOLN
OROMUCOSAL | Status: AC
  Administered 2022-03-06: 15 mL via OROMUCOSAL
  Filled 2022-03-06: qty 15

## 2022-03-06 MED ORDER — ROCURONIUM BROMIDE 10 MG/ML (PF) SYRINGE
PREFILLED_SYRINGE | INTRAVENOUS | Status: AC
  Filled 2022-03-06: qty 10

## 2022-03-06 MED ORDER — PHENYLEPHRINE HCL-NACL 20-0.9 MG/250ML-% IV SOLN: INTRAVENOUS | Status: DC | PRN

## 2022-03-06 MED ORDER — PHENYLEPHRINE 80 MCG/ML (10ML) SYRINGE FOR IV PUSH (FOR BLOOD PRESSURE SUPPORT)
PREFILLED_SYRINGE | INTRAVENOUS | Status: DC | PRN
  Administered 2022-03-06 (×2): 80 ug via INTRAVENOUS

## 2022-03-06 MED ORDER — INSULIN ASPART 100 UNIT/ML IJ SOLN: 0.0000 [IU] | INTRAMUSCULAR | Status: DC | PRN

## 2022-03-06 MED ORDER — ACETAMINOPHEN 500 MG PO TABS
1000.0000 mg | ORAL_TABLET | ORAL | Status: AC
  Administered 2022-03-06: 1000 mg via ORAL
  Filled 2022-03-06: qty 2

## 2022-03-06 MED ORDER — SUGAMMADEX SODIUM 200 MG/2ML IV SOLN
INTRAVENOUS | Status: DC | PRN
  Administered 2022-03-06: 150 mg via INTRAVENOUS

## 2022-03-06 MED ORDER — DEXAMETHASONE SODIUM PHOSPHATE 10 MG/ML IJ SOLN
INTRAMUSCULAR | Status: DC | PRN
  Administered 2022-03-06: 10 mg via INTRAVENOUS

## 2022-03-06 MED ORDER — PROPOFOL 10 MG/ML IV BOLUS
INTRAVENOUS | Status: DC | PRN
  Administered 2022-03-06: 100 mg via INTRAVENOUS

## 2022-03-06 MED ORDER — LIDOCAINE 2% (20 MG/ML) 5 ML SYRINGE
INTRAMUSCULAR | Status: DC | PRN
  Administered 2022-03-06: 40 mg via INTRAVENOUS

## 2022-03-06 MED ORDER — LIDOCAINE 2% (20 MG/ML) 5 ML SYRINGE
INTRAMUSCULAR | Status: AC
  Filled 2022-03-06: qty 10

## 2022-03-06 MED ORDER — SUCCINYLCHOLINE CHLORIDE 200 MG/10ML IV SOSY
PREFILLED_SYRINGE | INTRAVENOUS | Status: DC | PRN
  Administered 2022-03-06: 80 mg via INTRAVENOUS

## 2022-03-06 MED ORDER — FENTANYL CITRATE (PF) 250 MCG/5ML IJ SOLN
INTRAMUSCULAR | Status: AC
  Filled 2022-03-06: qty 5

## 2022-03-06 MED ORDER — LACTATED RINGERS IV SOLN: INTRAVENOUS | Status: DC

## 2022-03-06 MED ORDER — LIDOCAINE 2% (20 MG/ML) 5 ML SYRINGE
INTRAMUSCULAR | Status: AC
  Filled 2022-03-06: qty 5

## 2022-03-06 MED ORDER — ROCURONIUM BROMIDE 10 MG/ML (PF) SYRINGE
PREFILLED_SYRINGE | INTRAVENOUS | Status: DC | PRN
  Administered 2022-03-06: 50 mg via INTRAVENOUS

## 2022-03-06 SURGICAL SUPPLY — 42 items
APL PRP STRL LF DISP 70% ISPRP (MISCELLANEOUS) ×1
APL SKNCLS STERI-STRIP NONHPOA (GAUZE/BANDAGES/DRESSINGS) ×1
BAG COUNTER SPONGE SURGICOUNT (BAG) ×2 IMPLANT
BAG SPNG CNTER NS LX DISP (BAG) ×1
BENZOIN TINCTURE PRP APPL 2/3 (GAUZE/BANDAGES/DRESSINGS) ×2 IMPLANT
BLADE CLIPPER SURG (BLADE) IMPLANT
CHLORAPREP W/TINT 26 (MISCELLANEOUS) IMPLANT
CNTNR URN SCR LID CUP LEK RST (MISCELLANEOUS) IMPLANT
CONT SPEC 4OZ STRL OR WHT (MISCELLANEOUS)
COVER SURGICAL LIGHT HANDLE (MISCELLANEOUS) ×2 IMPLANT
DRAPE LAPAROTOMY 100X72 PEDS (DRAPES) ×2 IMPLANT
DRSG TEGADERM 4X4.75 (GAUZE/BANDAGES/DRESSINGS) IMPLANT
ELECT CAUTERY BLADE 6.4 (BLADE) IMPLANT
ELECT COATED BLADE 2.86 ST (ELECTRODE) IMPLANT
ELECT REM PT RETURN 9FT ADLT (ELECTROSURGICAL) ×1
ELECTRODE REM PT RTRN 9FT ADLT (ELECTROSURGICAL) ×2 IMPLANT
GAUZE SPONGE 4X4 12PLY STRL (GAUZE/BANDAGES/DRESSINGS) ×2 IMPLANT
GLOVE BIO SURGEON STRL SZ7 (GLOVE) ×2 IMPLANT
GLOVE BIOGEL PI IND STRL 7.5 (GLOVE) ×2 IMPLANT
GOWN STRL REUS W/ TWL LRG LVL3 (GOWN DISPOSABLE) ×4 IMPLANT
GOWN STRL REUS W/TWL LRG LVL3 (GOWN DISPOSABLE) ×1
KIT BASIN OR (CUSTOM PROCEDURE TRAY) ×2 IMPLANT
KIT TURNOVER KIT B (KITS) ×2 IMPLANT
NDL HYPO 25GX1X1/2 BEV (NEEDLE) IMPLANT
NEEDLE HYPO 25GX1X1/2 BEV (NEEDLE) IMPLANT
NS IRRIG 1000ML POUR BTL (IV SOLUTION) ×2 IMPLANT
PACK GENERAL/GYN (CUSTOM PROCEDURE TRAY) ×2 IMPLANT
PAD ARMBOARD 7.5X6 YLW CONV (MISCELLANEOUS) ×4 IMPLANT
PENCIL SMOKE EVACUATOR (MISCELLANEOUS) ×2 IMPLANT
SPECIMEN JAR SMALL (MISCELLANEOUS) IMPLANT
SPIKE FLUID TRANSFER (MISCELLANEOUS) IMPLANT
SPONGE T-LAP 4X18 ~~LOC~~+RFID (SPONGE) ×2 IMPLANT
STRIP CLOSURE SKIN 1/2X4 (GAUZE/BANDAGES/DRESSINGS) ×2 IMPLANT
SUT MNCRL AB 4-0 PS2 18 (SUTURE) ×2 IMPLANT
SUT SILK 2 0 SH (SUTURE) IMPLANT
SUT VIC AB 2-0 SH 27 (SUTURE)
SUT VIC AB 2-0 SH 27X BRD (SUTURE) IMPLANT
SUT VIC AB 3-0 SH 27 (SUTURE) ×1
SUT VIC AB 3-0 SH 27XBRD (SUTURE) ×2 IMPLANT
SYR CONTROL 10ML LL (SYRINGE) IMPLANT
TOWEL GREEN STERILE (TOWEL DISPOSABLE) ×2 IMPLANT
TOWEL GREEN STERILE FF (TOWEL DISPOSABLE) ×2 IMPLANT

## 2022-03-06 NOTE — Op Note (Signed)
Pre-op diagnosis: Recurrent invasive ductal carcinoma left breast Postop diagnosis: Same Procedure performed: Wide excision of left chest nodule Surgeon:Taline Nass K Rozalia Dino Anesthesia: General Indications:This is an 89 year old female who is s/p left mastectomy 12/08/19 for multifocal invasive ductal carcinoma. She is also s/p right mastectomy 2007 for DCIS and s/p partial gastrectomy for GIST. She has been on Anastrozole for the last two years. Recently, she was noted to have a subcutaneous nodule in the left chest wall above the mastectomy incision. US showed a sub-cm nodule in the deep dermal/ SQ tissue. Biopsy revealed recurrent invasive ductal carcinoma ER/PR positive Her 2 negative Ki67 10%. PET scan revealed no hypermetabolic activity in this area or elsewhere. She presents now for wide excision of the left chest nodule.  Description of procedure: The patient is brought to the operating room and placed in the supine position on the operating room table.  After an adequate level of general anesthesia was obtained, her left chest was prepped with ChloraPrep and draped sterile fashion.  A timeout was taken to ensure the proper patient and proper procedure.  The patient has a palpable nodule in the subcutaneous space just above the lateral end of her incision.  This is palpated at less than a centimeter in diameter.  We infiltrated with local anesthetic.  I made an elliptical incision over the palpable mass.  We excised an ellipse of skin 3 cm x 2 cm.  We included the subcutaneous tissue all the way down to the underlying muscle.  The specimen was removed and was oriented with a long suture lateral and a short suture superior.  We inspected the wound carefully for hemostasis.  The superior skin flap mobilized easily to allow closure without undue tension.  We closed with a deep layer of 3-0 Vicryl.  4-0 Monocryl was used to close the skin.  Benzoin and Steri-Strips were applied.  An occlusive dressing was  applied.  The patient was then extubated and brought to the recovery room in stable condition.  All sponge, instrument, and needle counts are correct.  Imogene Burn. Georgette Dover, MD, [facility] Surgery  General Surgery   03/06/2022 2:01 PM

## 2022-03-06 NOTE — H&P (Signed)
Cindy Wong is an 89 y.o. female.   Chief Complaint: Recurrent breast cancer HPI: This is an 89 year old female who is s/p left mastectomy 12/08/19 for multifocal invasive ductal carcinoma.  She is also s/p right mastectomy 2007 for DCIS and s/p partial gastrectomy for GIST.  She has been on Anastrozole for the last two years.  Recently, she was noted to have a subcutaneous nodule in the left chest wall above the mastectomy incision.  US showed a sub-cm nodule in the deep dermal/ SQ tissue.  Biopsy revealed recurrent invasive ductal carcinoma ER/PR positive Her 2 negative Ki67 10%.  PET scan revealed no hypermetabolic activity in this area or elsewhere.  She presents now for wide excision of the left chest nodule.  Past Medical History:  Diagnosis Date   Adenomatous colon polyp    tubular   Anxiety    Arthritis    Blood transfusion without reported diagnosis    Breast cancer ([place]) 2007   right mastectomy   Breast cancer ([place]) 10/2019   left breast IDC   Cataract    Depression    Diabetes mellitus    Hx of meningitis 2012   Hyperlipemia    Hypertension    Personal history of radiation therapy    Renal disorder    Stomach cancer ([place]) 1997   partial gastrectomy    Past Surgical History:  Procedure Laterality Date   ABDOMINAL HYSTERECTOMY  1974   Supracervical   BREAST BIOPSY Left 02/06/2022   [place] LT BREAST BX W LOC DEV 1ST LESION IMG BX [place] US GUIDE 02/06/2022 [facility] MAMMOGRAPHY   BREAST LUMPECTOMY Right 1993   BREAST SURGERY     Mastectomy   CHOLECYSTECTOMY     COLONOSCOPY     MASTECTOMY Right    MASTECTOMY, PARTIAL Left 12/08/2019   Procedure: LEFT MASTECTOMY;  Surgeon: Donnie Mesa, MD;  Location: [place];  Service: General;  Laterality: Left;  LMA AND PECTORAL BLOCK   meningitis     OOPHORECTOMY     One ovary removed--?side   Stomach tumor     UPPER GASTROINTESTINAL ENDOSCOPY      Family History  Problem Relation Age of Onset   Breast  cancer Sister 54   Diabetes Sister    Hypertension Sister    Colon cancer Sister    Melanoma Sister    Heart disease Brother    Diabetes Brother    Hypertension Brother    Prostate cancer Brother    Colon cancer Brother    Heart failure Brother    Hypertension Mother    Stroke Father    Heart failure Sister    Kidney disease Sister    Hypertension Sister    Cancer Sister    Heart disease Brother    Hypertension Brother    Esophageal cancer Neg Hx    Rectal cancer Neg Hx    Stomach cancer Neg Hx    Social History:  reports that she has never smoked. She has never used smokeless tobacco. She reports that she does not drink alcohol and does not use drugs.  Allergies:  Allergies  Allergen Reactions   Sulfamethoxazole Hives, Itching and Rash   Sulfonamide Derivatives Hives, Itching and Rash    Medications Prior to Admission  Medication Sig Dispense Refill   amLODipine (NORVASC) 5 MG tablet Take 10 mg by mouth daily.     betamethasone dipropionate 0.05 % cream Apply 1 Application topically 2 (two) times daily as  needed (psoriasis).     carvedilol (COREG) 6.25 MG tablet TAKE 1 TABLET(6.25 MG) BY MOUTH TWICE DAILY (Patient taking differently: Take 12.5 mg by mouth 2 (two) times daily.) 180 tablet 2   Cholecalciferol (D2000 ULTRA STRENGTH) 50 MCG (2000 UT) CAPS Take 2,000 Units by mouth daily.     glipiZIDE (GLUCOTROL XL) 10 MG 24 hr tablet Take 10 mg by mouth daily.     hydrochlorothiazide (HYDRODIURIL) 25 MG tablet Take 25 mg by mouth daily. In the morning  10   metFORMIN (GLUCOPHAGE) 500 MG tablet Take 500 mg by mouth daily with breakfast.     mirtazapine (REMERON) 7.5 MG tablet Take 7.5 mg by mouth at bedtime.     Multiple Vitamins-Minerals (CENTRUM SILVER PO) Take 1 tablet by mouth daily.      potassium chloride SA (K-DUR,KLOR-CON) 20 MEQ tablet Take 20 mEq by mouth daily.     pravastatin (PRAVACHOL) 40 MG tablet Take 40 mg by mouth every evening.     ONETOUCH VERIO test  strip 2 (two) times daily. for testing  2      Physical Exam  Well-developed well-nourished no apparent distress Bilateral mastectomy sites seem to be well-healed. No palpable masses on the right side. The left chest seems relatively normal except for a 1 cm mass just above the incision at the lateral and. This seems to be in the subcutaneous tissue and does not seem to be fixed to the chest wall. No overlying erythema or induration. No other palpable masses on the left chest wall. No palpable left axillary lymphadenopathy.  Assessment/Plan Recurrent invasive ductal carcinoma left chest wall  Plan wide excision of the recurrent left breast cancer.  The surgical procedure has been discussed with the patient.  Potential risks, benefits, alternative treatments, and expected outcomes have been explained.  All of the patient's questions at this time have been answered.  The likelihood of reaching the patient's treatment goal is good.  The patient understand the proposed surgical procedure and wishes to proceed.   [name], MD 03/06/2022, 10:57 AM

## 2022-03-06 NOTE — Discharge Instructions (Signed)
[place] Office Phone Number [phone]  BREAST MASS EXCISION:  POST OP INSTRUCTIONS  Always review your discharge instruction sheet given to you by the facility where your surgery was performed.  IF YOU HAVE DISABILITY OR FAMILY LEAVE FORMS, YOU MUST BRING THEM TO THE OFFICE FOR PROCESSING.  DO NOT GIVE THEM TO YOUR DOCTOR.  A prescription for pain medication may be given to you upon discharge.  Take your pain medication as prescribed, if needed.  If narcotic pain medicine is not needed, then you may take acetaminophen (Tylenol) or ibuprofen (Advil) as needed. Take your usually prescribed medications unless otherwise directed If you need a refill on your pain medication, please contact your pharmacy.  They will contact our office to request authorization.  Prescriptions will not be filled after 5pm or on week-ends. You should eat very light the first 24 hours after surgery, such as soup, crackers, pudding, etc.  Resume your normal diet the day after surgery. Most patients will experience some swelling and bruising in the breast.  Ice packs and a good support bra will help.  Swelling and bruising can take several days to resolve.  It is common to experience some constipation if taking pain medication after surgery.  Increasing fluid intake and taking a stool softener will usually help or prevent this problem from occurring.  A mild laxative (Milk of Magnesia or Miralax) should be taken according to package directions if there are no bowel movements after 48 hours. Unless discharge instructions indicate otherwise, you may remove your bandages 24-48 hours after surgery, and you may shower at that time.  You may have steri-strips (small skin tapes) in place directly over the incision.  These strips should be left on the skin for 7-10 days.  If your surgeon used skin glue on the incision, you may shower in 24 hours.  The glue will flake off over the next 2-3 weeks.  Any sutures or  staples will be removed at the office during your follow-up visit. ACTIVITIES:  You may resume regular daily activities (gradually increasing) beginning the next day.  Wearing a good support bra or sports bra minimizes pain and swelling.  You may have sexual intercourse when it is comfortable. You may drive when you no longer are taking prescription pain medication, you can comfortably wear a seatbelt, and you can safely maneuver your car and apply brakes. RETURN TO WORK:  ______________________________________________________________________________________ Dennis Bast should see your doctor in the office for a follow-up appointment approximately two weeks after your surgery.  Your doctor's nurse will typically make your follow-up appointment when she calls you with your pathology report.  Expect your pathology report 2-3 business days after your surgery.  You may call to check if you do not hear from [place] after three days. OTHER INSTRUCTIONS: _______________________________________________________________________________________________ _____________________________________________________________________________________________________________________________________ _____________________________________________________________________________________________________________________________________ _____________________________________________________________________________________________________________________________________  WHEN TO CALL YOUR DOCTOR: Fever over 101.0 Nausea and/or vomiting. Extreme swelling or bruising. Continued bleeding from incision. Increased pain, redness, or drainage from the incision.  The clinic staff is available to answer your questions during regular business hours.  Please don't hesitate to call and ask to speak to one of the nurses for clinical concerns.  If you have a medical emergency, go to the nearest emergency room or call 911.  A surgeon from [facility]  Surgery is always on call at the hospital.  For further questions, please visit [email or web address]

## 2022-03-06 NOTE — Anesthesia Postprocedure Evaluation (Signed)
Anesthesia Post Note  Patient: Cindy Wong  Procedure(s) Performed: EXCISION OF LEFT CHEST WALL NODULE (Left: Chest)     Patient location during evaluation: PACU Anesthesia Type: General Level of consciousness: awake and alert Pain management: pain level controlled Vital Signs Assessment: post-procedure vital signs reviewed and stable Respiratory status: spontaneous breathing, nonlabored ventilation, respiratory function stable and patient connected to nasal cannula oxygen Cardiovascular status: blood pressure returned to baseline and stable Postop Assessment: no apparent nausea or vomiting Anesthetic complications: no  No notable events documented.  Last Vitals:  Vitals:   03/06/22 1425 03/06/22 1440  BP: (!) 158/68 (!) 149/68  Pulse: 74 71  Resp: 15 20  Temp:  36.7 C  SpO2: 95% 97%    Last Pain:  Vitals:   03/06/22 1440  TempSrc:   PainSc: 0-No pain                 Tiajuana Amass

## 2022-03-06 NOTE — Anesthesia Procedure Notes (Signed)
Procedure Name: Intubation Date/Time: 03/06/2022 1:27 PM  Performed by: Timoteo Expose, CRNAPre-anesthesia Checklist: Patient identified, Emergency Drugs available, Suction available, Patient being monitored and Timeout performed Patient Re-evaluated:Patient Re-evaluated prior to induction Oxygen Delivery Method: Circle system utilized Preoxygenation: Pre-oxygenation with 100% oxygen Induction Type: IV induction and Rapid sequence Laryngoscope Size: Mac and 3 Grade View: Grade III Tube type: Oral Tube size: 7.0 mm Number of attempts: 1 Airway Equipment and Method: Stylet Placement Confirmation: ETT inserted through vocal cords under direct vision, positive ETCO2 and breath sounds checked- equal and bilateral Secured at: 21 cm Tube secured with: Tape Dental Injury: Teeth and Oropharynx as per pre-operative assessment

## 2022-03-06 NOTE — Transfer of Care (Addendum)
Immediate Anesthesia Transfer of Care Note  Patient: Cindy Wong  Procedure(s) Performed: EXCISION OF LEFT CHEST WALL NODULE (Left: Chest)  Patient Location: PACU  Anesthesia Type:General  Level of Consciousness: awake and alert   Airway & Oxygen Therapy: Patient Spontanous Breathing and Patient connected to nasal cannula oxygen  Post-op Assessment: Report given to RN and Post -op Vital signs reviewed and stable  Post vital signs: Reviewed and stable  Last Vitals:  Vitals Value Taken Time  BP 150/71 03/06/22 1410  Temp 36.6 C 03/06/22 1410  Pulse 74 03/06/22 1414  Resp 17 03/06/22 1414  SpO2 94 % 03/06/22 1414  Vitals shown include unvalidated device data.  Last Pain:  Vitals:   03/06/22 1410  TempSrc:   PainSc: 0-No pain      Patients Stated Pain Goal: 0 (60/04/59 9774)  Complications: No notable events documented.

## 2022-03-07 ENCOUNTER — Other Ambulatory Visit (HOSPITAL_COMMUNITY): Payer: Medicare Other

## 2022-03-07 ENCOUNTER — Telehealth: Payer: Self-pay | Admitting: Nurse Practitioner

## 2022-03-07 ENCOUNTER — Encounter (HOSPITAL_COMMUNITY): Payer: Self-pay | Admitting: Surgery

## 2022-03-07 NOTE — Telephone Encounter (Signed)
Left patient a vm regarding upcoming appointments

## 2022-03-08 LAB — SURGICAL PATHOLOGY

## 2022-03-12 IMAGING — MG DIGITAL SCREENING UNILAT LEFT W/ TOMO W/ CAD
6 series · 6 of 18 positions shown · non-contrast
Comparison: Previous exam(s).

CLINICAL DATA: Screening.

EXAM:
DIGITAL SCREENING UNILATERAL LEFT MAMMOGRAM WITH CAD AND TOMO

[L CC synth-2D]
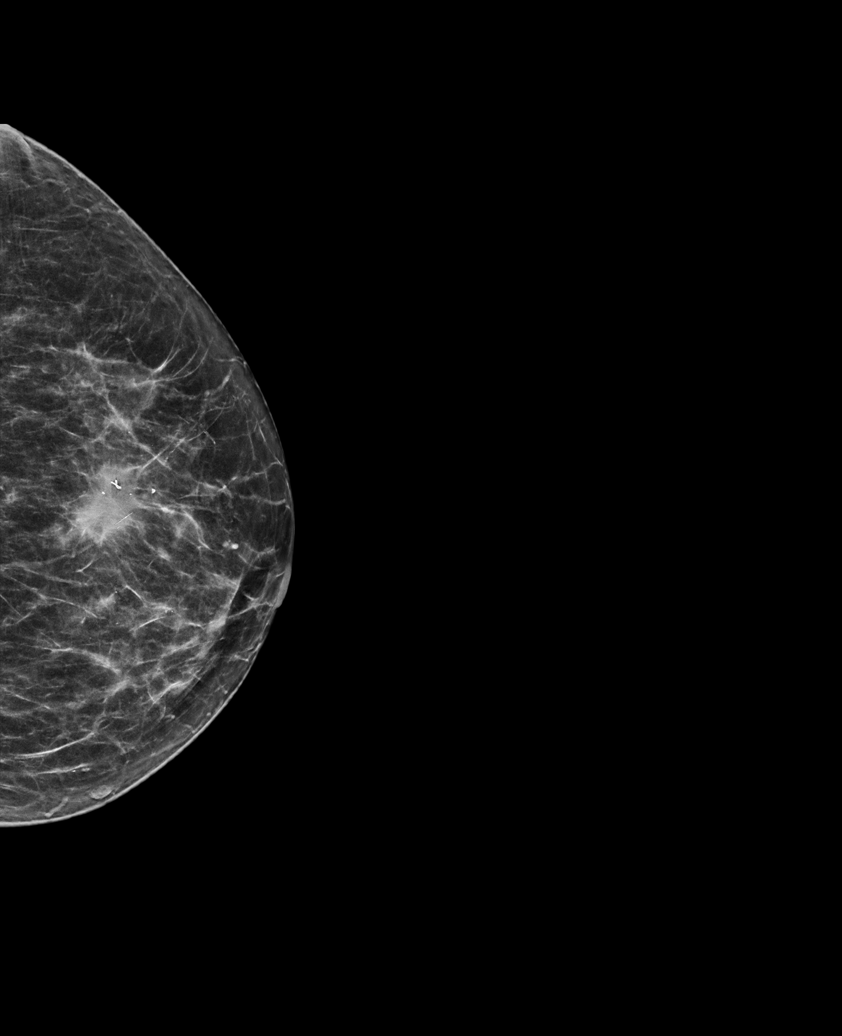

[L MLO synth-2D (1 of 2)]
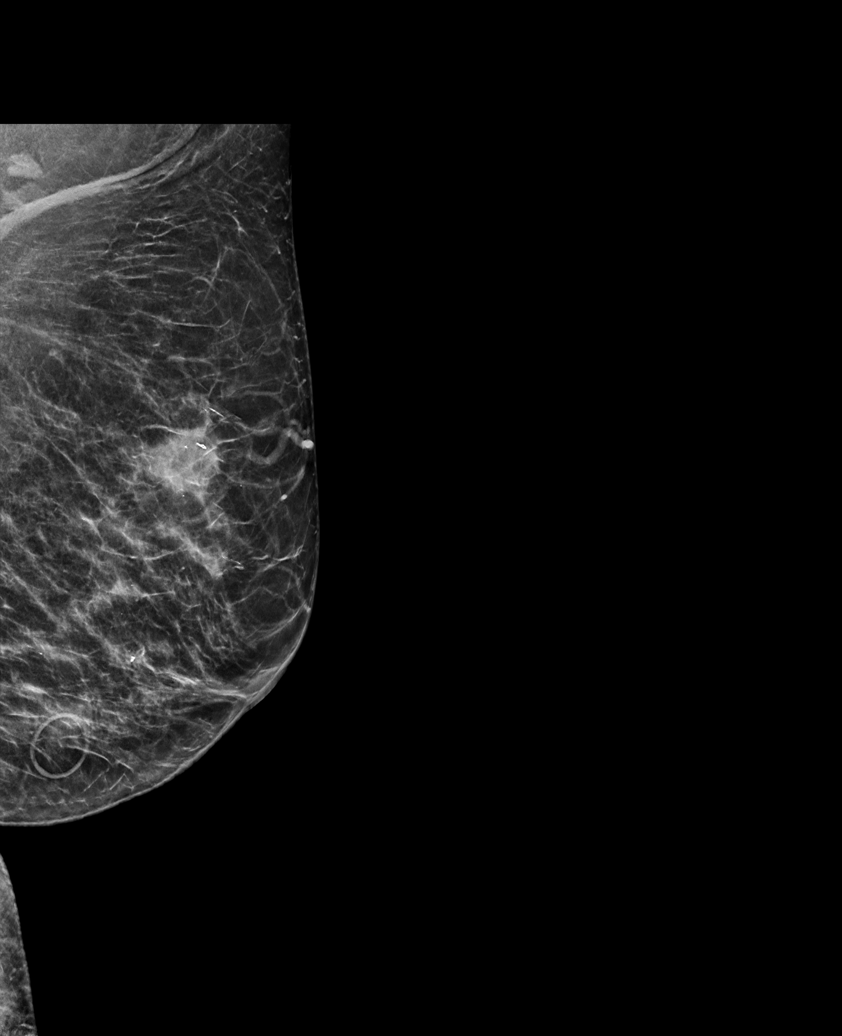

[L MLO synth-2D (2 of 2)]
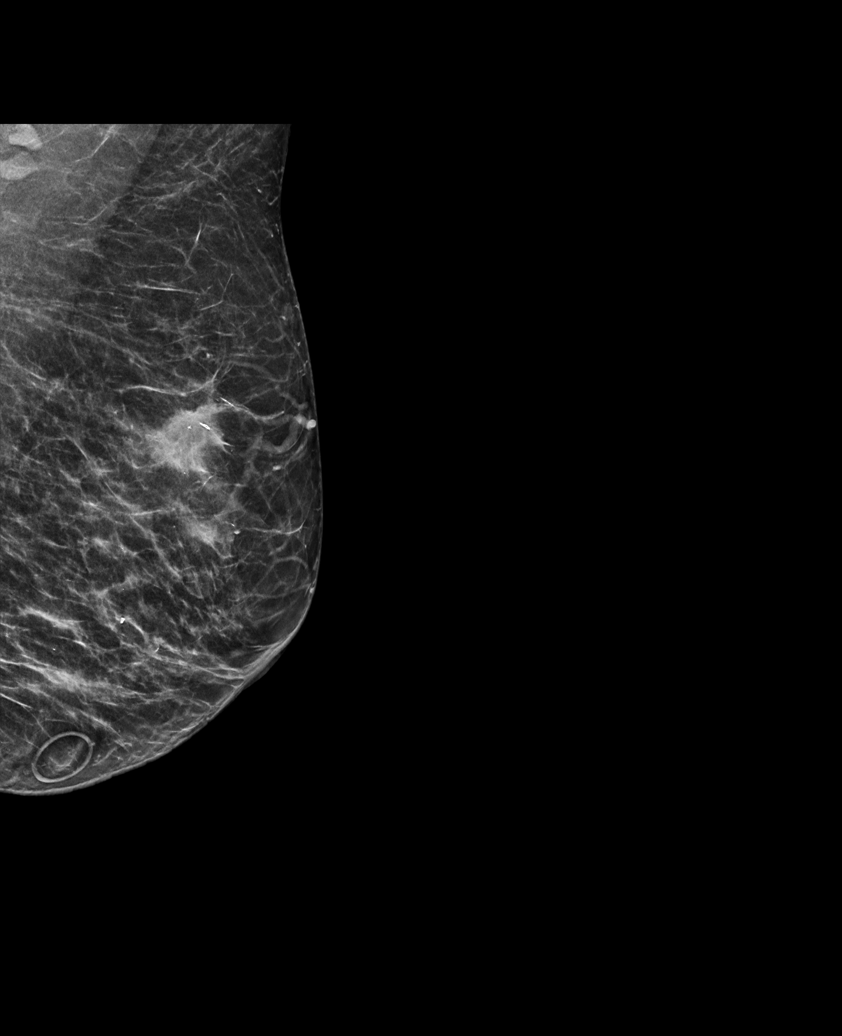

[L MLO tomo (1 of 2) · tomo slice 30/59.0]
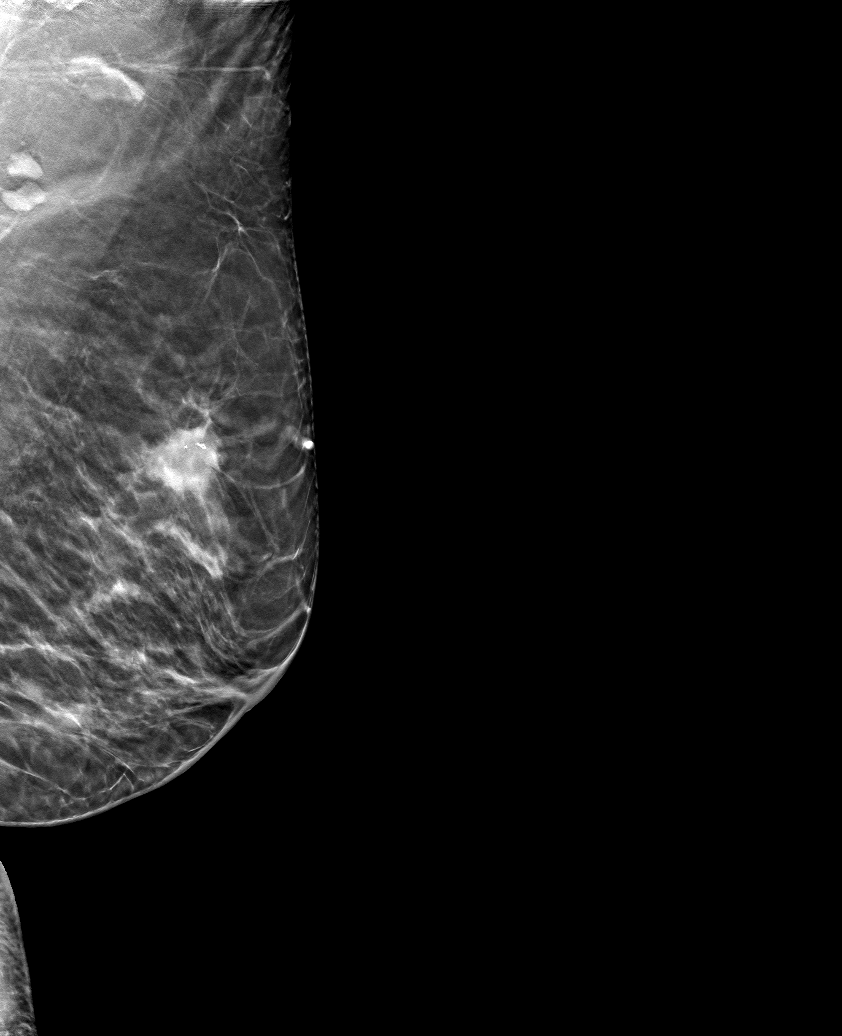

[L CC tomo · tomo slice 31/60.0]
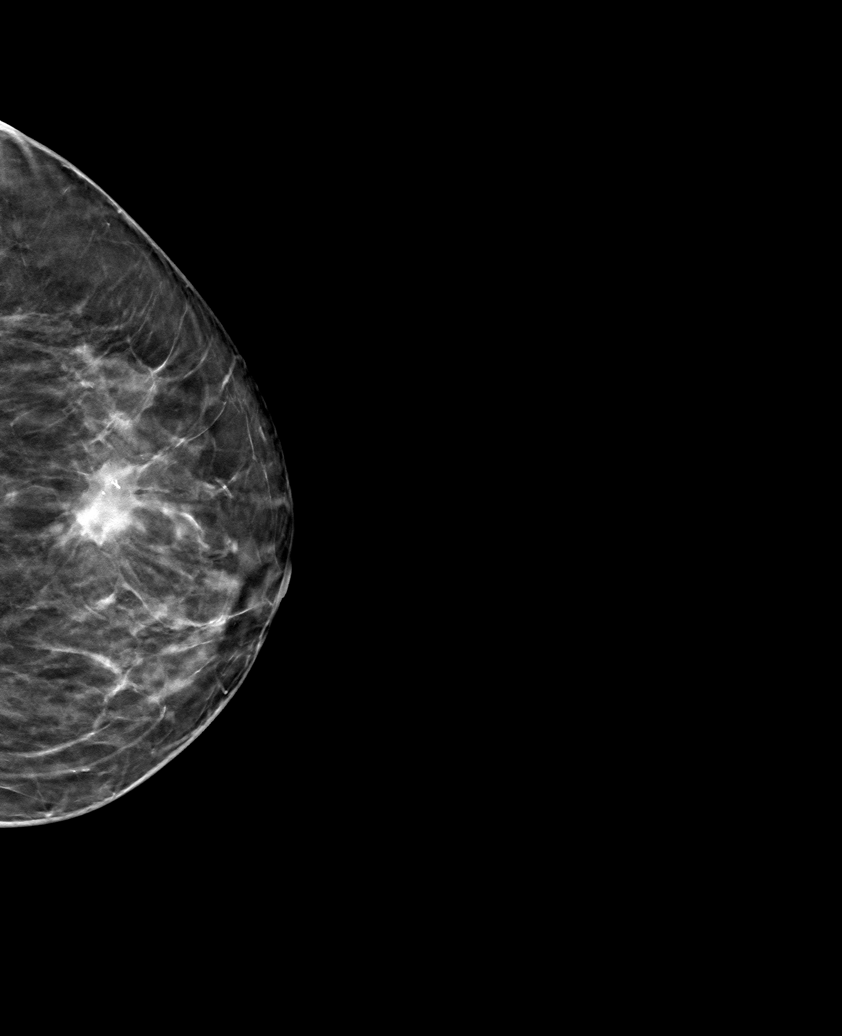

[L MLO tomo (2 of 2) · tomo slice 29/57.0]
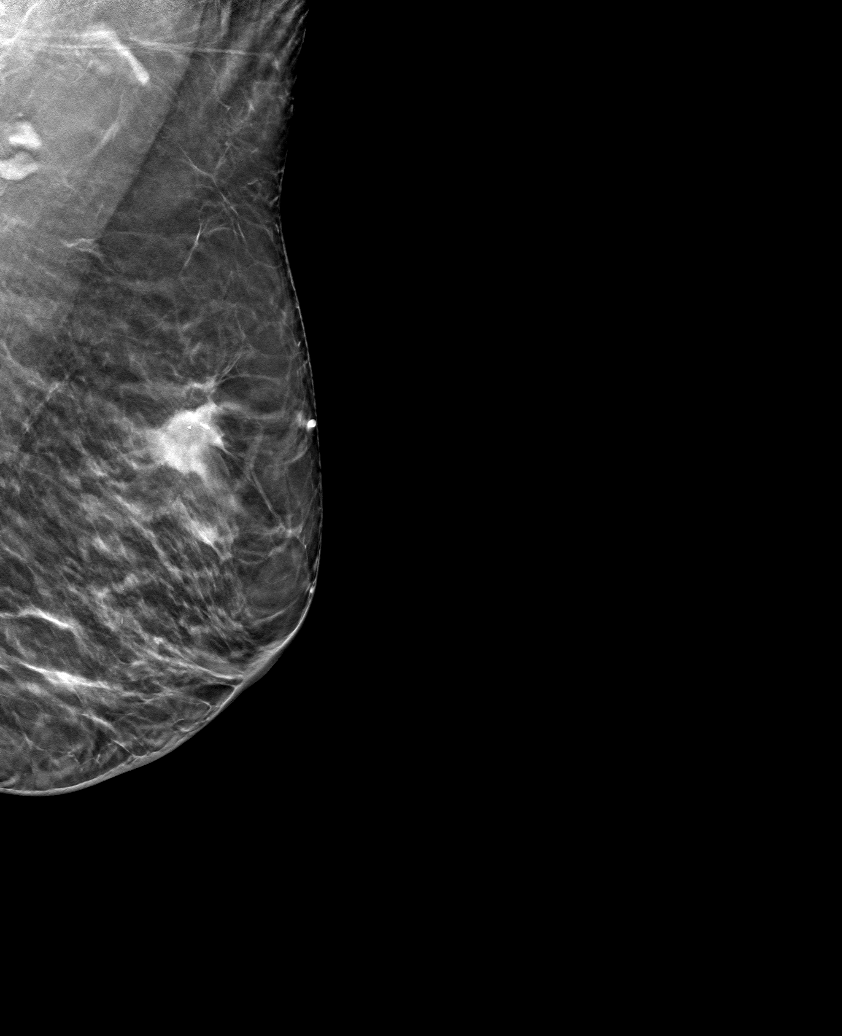

[6 of 18 positions shown; findings below may reference images not displayed]

ACR Breast Density Category b: There are scattered areas of
fibroglandular density.
FINDINGS: In the left breast, a possible mass warrants further evaluation. In
the right breast, no findings suspicious for malignancy.

Images were processed with CAD.
IMPRESSION: Further evaluation is suggested for a possible mass in the left
breast.

RECOMMENDATION:
Diagnostic mammogram and possibly ultrasound of the left breast.
(Code:UP-3-AA1)

The patient will be contacted regarding the findings, and additional
imaging will be scheduled.

BI-RADS CATEGORY  0: Incomplete. Need additional imaging evaluation
and/or prior mammograms for comparison.

## 2022-03-23 ENCOUNTER — Encounter: Payer: Self-pay | Admitting: Cardiology

## 2022-03-23 ENCOUNTER — Telehealth: Payer: Self-pay

## 2022-03-23 ENCOUNTER — Ambulatory Visit: Payer: Medicare Other | Admitting: Cardiology

## 2022-03-23 VITALS — BP 151/71 | HR 75 | Ht 68.0 in | Wt 148.2 lb

## 2022-03-23 DIAGNOSIS — I10 Essential (primary) hypertension: Secondary | ICD-10-CM | POA: Diagnosis not present

## 2022-03-23 DIAGNOSIS — E782 Mixed hyperlipidemia: Secondary | ICD-10-CM | POA: Diagnosis not present

## 2022-03-23 DIAGNOSIS — E118 Type 2 diabetes mellitus with unspecified complications: Secondary | ICD-10-CM

## 2022-03-23 NOTE — Progress Notes (Signed)
Follow up visit  Subjective:   Cindy Wong, female    DOB: 04/03/1931, 89 y.o.   MRN: [id number]   Chief Complaint  Patient presents with   Essential hypertension   Hyperlipidemia   Follow-up     HPI  89 year old African-American female with controlled hypertension, type 2 diabetes mellitus, hyperlipidemia, seen for frequent PVCs/ventricular bigeminy, abnormal stress test  Patient home blood pressure moderately controlled at home.    Average Systolic BP Level 308.6 mmHg Lowest Systolic BP Level 578 mmHg Highest Systolic BP Level 469 mmHg   Average Diastolic BP Level 62.95 mmHg Lowest Diastolic BP Level 70 mmHg Highest Diastolic BP Level 96 mmHg   03/23/2022 Friday at 07:58 AM          141 / 89                03/22/2022 Thursday at 10:02 PM     136 / 81                03/22/2022 Thursday at 07:50 PM     156 / 85                03/22/2022 Thursday at 10:19 AM     140 / 80                03/21/2022 Wednesday at 10:43 PM 147 / 84                03/21/2022 Wednesday at 04:23 PM 151 / 83                03/21/2022 Wednesday at 10:31 AM 138 / 78                03/20/2022 Tuesday at 10:15 PM      124 / 71                03/20/2022 Tuesday at 04:28 PM      157 / 87                03/19/2022 Monday at 03:22 PM       141 / 76  Blood sugar has been elevated recently. She denies chest pain, shortness of breath, palpitations, leg edema, orthopnea, PND, TIA/syncope.    Current Outpatient Medications:    amLODipine (NORVASC) 5 MG tablet, Take 10 mg by mouth daily., Disp: , Rfl:    betamethasone dipropionate 0.05 % cream, Apply 1 Application topically 2 (two) times daily as needed (psoriasis)., Disp: , Rfl:    carvedilol (COREG) 6.25 MG tablet, Take 6.25 mg by mouth 2 (two) times daily with a meal., Disp: , Rfl:    Cholecalciferol (D2000 ULTRA STRENGTH) 50 MCG (2000 UT) CAPS, Take 2,000 Units by mouth daily., Disp: , Rfl:    glipiZIDE (GLUCOTROL XL) 10 MG 24 hr tablet, Take 10  mg by mouth daily., Disp: , Rfl:    hydrochlorothiazide (HYDRODIURIL) 25 MG tablet, Take 25 mg by mouth daily. In the morning, Disp: , Rfl: 10   metFORMIN (GLUCOPHAGE) 500 MG tablet, Take 500 mg by mouth daily with breakfast., Disp: , Rfl:    mirtazapine (REMERON) 7.5 MG tablet, Take 7.5 mg by mouth at bedtime., Disp: , Rfl:    Multiple Vitamins-Minerals (CENTRUM SILVER PO), Take 1 tablet by mouth daily. , Disp: , Rfl:    ONETOUCH VERIO test strip, 2 (two) times daily. for testing, Disp: , Rfl: 2   potassium chloride SA (K-DUR,KLOR-CON) 20 MEQ  tablet, Take 20 mEq by mouth daily., Disp: , Rfl:    pravastatin (PRAVACHOL) 40 MG tablet, Take 40 mg by mouth every evening., Disp: , Rfl:    traMADol (ULTRAM) 50 MG tablet, Take 1 tablet (50 mg total) by mouth every 6 (six) hours as needed., Disp: 20 tablet, Rfl: 0  Cardiovascular studies:  EKG 03/23/2022: Sinus rhythm 70 bpm  Left atrial enlargement Nonspecific T wave abnormality  ABI 05/13/2020:  This exam reveals moderately decreased perfusion of the right lower  extremity, noted at the anterior tibial and post tibial artery level (ABI  0.79) and moderately decreased perfusion of the left lower extremity,  noted at the post tibial artery level (ABI 0.79).  There is mildly abnormal biphasic waveform pattern at the level of the  ankles.   Echocardiogram 10/16/2018: Left ventricle cavity is normal in size. Normal left ventricular wall thickness. Normal LV systolic function with EF 55%. Normal global wall motion. Diastolic function assessment limited due to frequent PVC's. Calculated EF 55%. Trileaflet aortic valve with mild thickening. Trace aortic stenosis. No regurgitation noted. Mild tricuspid regurgitation. Trace pulmonic regurgitation.  No evidence of pulmonary hypertension.  Lexiscan Myoview stress test 09/29/2018: Lexiscan stress test was performed. Stress EKG is non-diagnostic, as this is pharmacological stress test. In addition, rest and  stress EKG demonstrate sinus rhythm, frequent PVC's, inferolateral nonspecific ST-T changes.  LVEF 63%. SPECT stress and rest images demonstrate very small size, mild intensity, fixed perfusion defect in basal inferoseptal myocardium. While the defect is small, TID of 1.31 with regional perfusion defect is considered high risk finding. Clinical correlation recommended.   EKG 09/15/2018: Sinus rhythm 77 bpm. Frequent PVC  Carotid US 08/14/2018: Right Carotid: Velocities in the right ICA are consistent with a 1-39% stenosis.                Non-hemodynamically significant plaque <50% noted in the CCA. The                ECA appears <50% stenosed.   Left Carotid: Velocities in the left ICA are consistent with a 1-39% stenosis.               Non-hemodynamically significant plaque noted in the CCA.               Heterogenous multinodular thyroid gland incidentally noted.   Recent labs: 02/23/2022: Glucose 177, BUN/Cr 16/0.70. EGFR >60. Na/K 142/3.8. Rest of the CMP normal H/H 13/42. MCV 83. Platelets 331  12/04/2019: Glucose 114, BUN/Cr 23/1.02. EGFR 50. Na/K 140/4.2. Rest of the CMP normal H/H 14/47. MCV 83. Platelets 277  11/21/2018: Glucose 101, BUN/Cr 18/0.89. EGFR >60. Na/K 142/4.1. Rest of the CMP normal H/H 15/48. MCV 85. Platelets 268   Review of Systems  Constitutional: Positive for malaise/fatigue.  Cardiovascular:  Negative for chest pain, dyspnea on exertion, leg swelling, palpitations and syncope.         Vitals:   03/23/22 1423  BP: (!) 151/71  Pulse: 75  SpO2: 98%     Body mass index is 22.53 kg/m. Filed Weights   03/23/22 1423  Weight: 148 lb 3.2 oz (67.2 kg)     Objective:   Physical Exam Vitals and nursing note reviewed.  Constitutional:      Appearance: She is well-developed.  Neck:     Vascular: No JVD.  Cardiovascular:     Rate and Rhythm: Normal rate and regular rhythm.     Pulses: Normal pulses and intact distal pulses.  Heart sounds:  Normal heart sounds. No murmur heard. Pulmonary:     Effort: Pulmonary effort is normal.     Breath sounds: Normal breath sounds. No wheezing or rales.           Assessment & Recommendations:   89 year old African-American female with controlled hypertension, type 2 diabetes mellitus, hyperlipidemia, seen for frequent PVCs/ventricular bigeminy, abnormal stress test  Hypertension: Uncontrolled. Take 2 pills of carvedilol 6.25 mg bid. She has f/u w/Dr. Osborne Casco in two weeks. If BP better controlled by then, I would request Dr. Osborne Casco to change it to 12.5 mg bid.  Continue amlodipine 5 mg daily. Mild systolic murmur. Check echocardiogram.  Mixed hyperlipidemia: Currently on pravastatin 40 mg daily.  Type 2 DM: Check A1C. She will follow up with Dr. Osborne Casco in two weeks.  PAD: Mild. symptoms  F/u in 1 year    Nigel Mormon, MD Pager: [phone] Office: [phone]

## 2022-03-23 NOTE — Telephone Encounter (Signed)
Patient home blood pressure moderately controlled at home.   Average Systolic BP Level 694.8 mmHg Lowest Systolic BP Level 546 mmHg Highest Systolic BP Level 270 mmHg  Average Diastolic BP Level 35.00 mmHg Lowest Diastolic BP Level 70 mmHg Highest Diastolic BP Level 96 mmHg  03/23/2022 Friday at 07:58 AM 141 / 89      03/22/2022 Thursday at 10:02 PM 136 / 81      03/22/2022 Thursday at 07:50 PM 156 / 85      03/22/2022 Thursday at 10:19 AM 140 / 80      03/21/2022 Wednesday at 10:43 PM 147 / 84      03/21/2022 Wednesday at 04:23 PM 151 / 83      03/21/2022 Wednesday at 10:31 AM 138 / 78      03/20/2022 Tuesday at 10:15 PM 124 / 71      03/20/2022 Tuesday at 04:28 PM 157 / 87      03/19/2022 Monday at 03:22 PM 141 / 76

## 2022-03-26 DIAGNOSIS — N390 Urinary tract infection, site not specified: Secondary | ICD-10-CM | POA: Diagnosis not present

## 2022-03-26 DIAGNOSIS — N184 Chronic kidney disease, stage 4 (severe): Secondary | ICD-10-CM | POA: Diagnosis not present

## 2022-03-26 DIAGNOSIS — E118 Type 2 diabetes mellitus with unspecified complications: Secondary | ICD-10-CM | POA: Diagnosis not present

## 2022-03-26 DIAGNOSIS — E782 Mixed hyperlipidemia: Secondary | ICD-10-CM | POA: Diagnosis not present

## 2022-03-27 DIAGNOSIS — I10 Essential (primary) hypertension: Secondary | ICD-10-CM | POA: Diagnosis not present

## 2022-03-27 LAB — LIPID PANEL
Chol/HDL Ratio: 2.6 ratio (ref 0.0–4.4)
Cholesterol, Total: 134 mg/dL (ref 100–199)
HDL: 51 mg/dL (ref >39)
LDL Chol Calc (NIH): 61 mg/dL (ref 0–99)
Triglycerides: 123 mg/dL (ref 0–149)
VLDL Cholesterol Cal: 22 mg/dL (ref 5–40)

## 2022-03-27 LAB — HEMOGLOBIN A1C
Est. average glucose Bld gHb Est-mCnc: 171 mg/dL
Hgb A1c MFr Bld: 7.6 % — ABNORMAL HIGH (ref 4.8–5.6)

## 2022-03-29 IMAGING — US US BREAST BX W LOC DEV 1ST LESION IMG BX SPEC US GUIDE*L*
1 series · 10 of 10 positions shown · non-contrast
Comparison: Previous exam(s).
COMPARISON: Previous exam(s).

Addendum:
CLINICAL DATA: 88-year-old female with history of right breast
cancer presenting for biopsy of a left breast mass.

EXAM:
ULTRASOUND GUIDED LEFT BREAST CORE NEEDLE BIOPSY

[Series 1: us breast bx w loc dev 1st lesion img bx spec us g · 0.06mm/px · 10 of 10 slices shown]
[im 1/10]
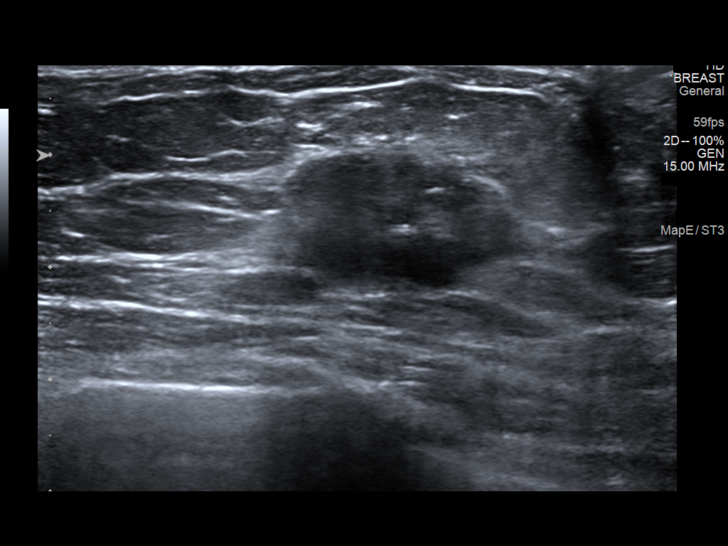
[im 2/10]
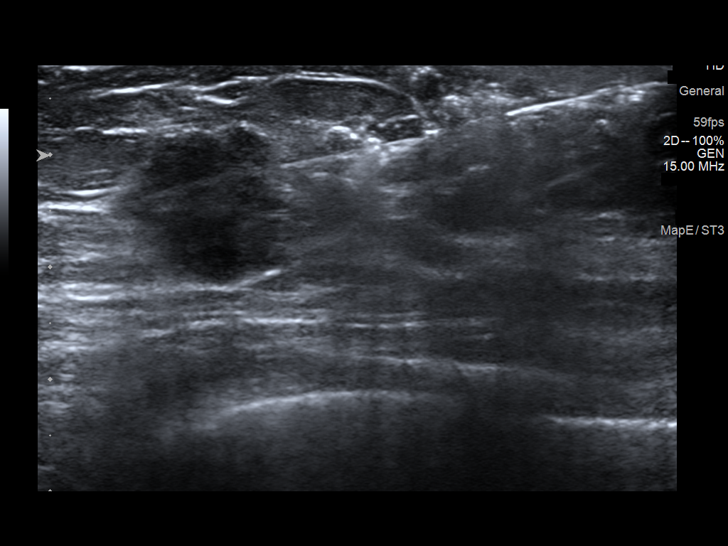
[im 3/10]
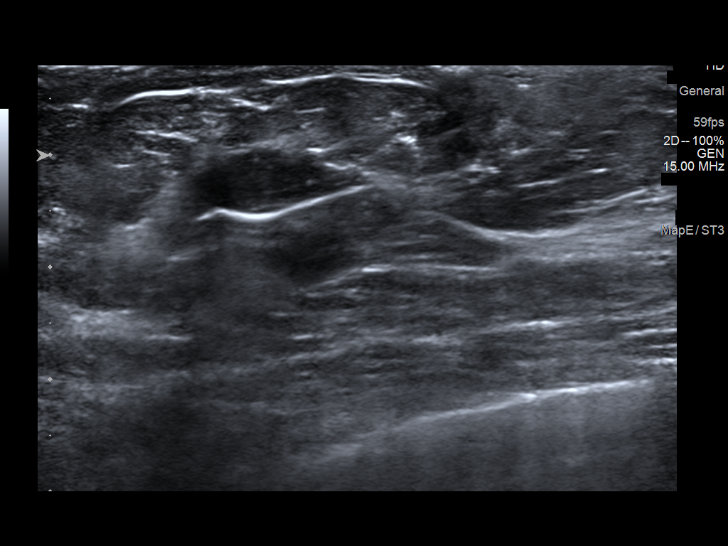
[im 4/10]
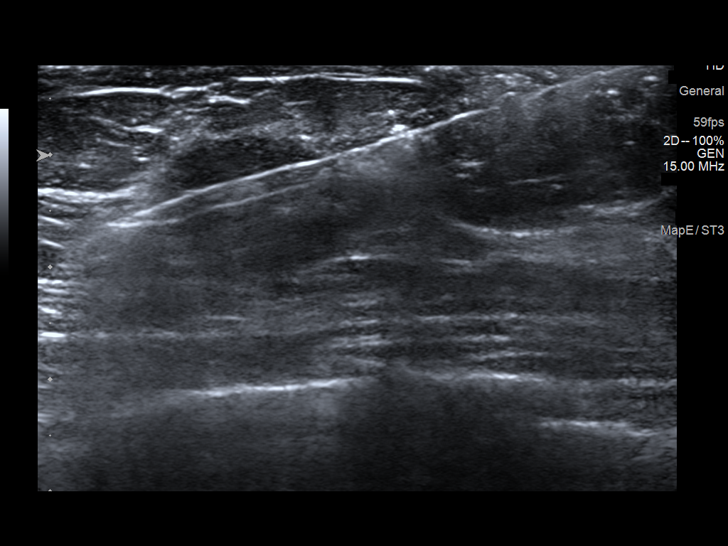
[im 5/10]
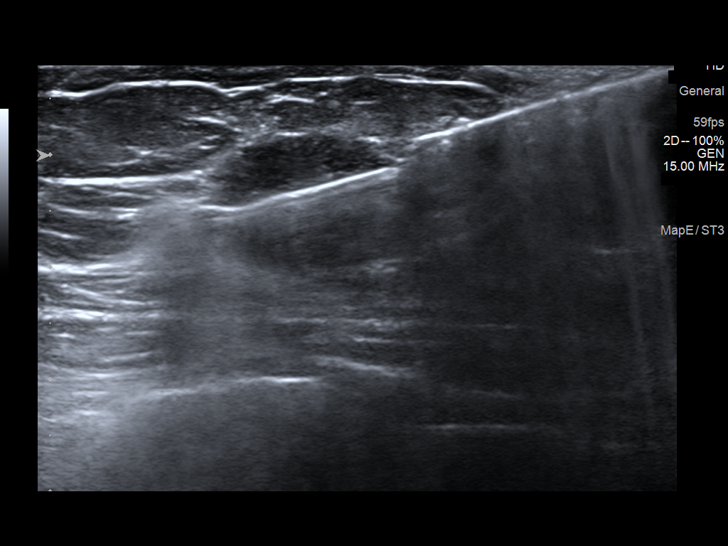
[im 6/10]
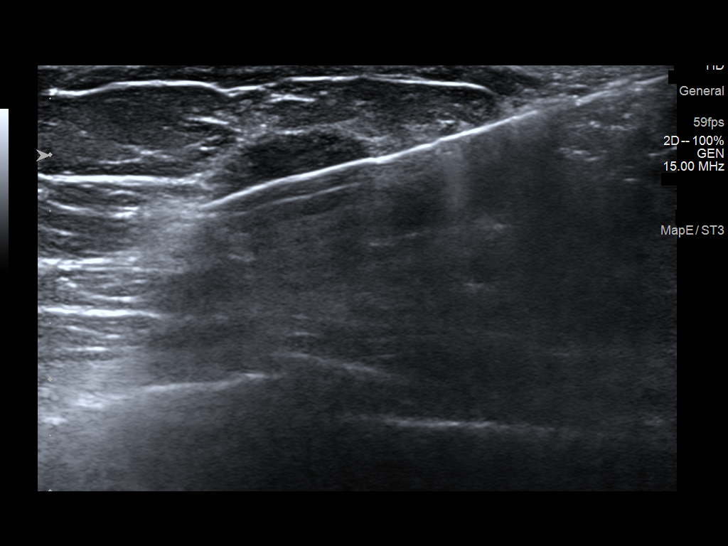
[im 7/10]
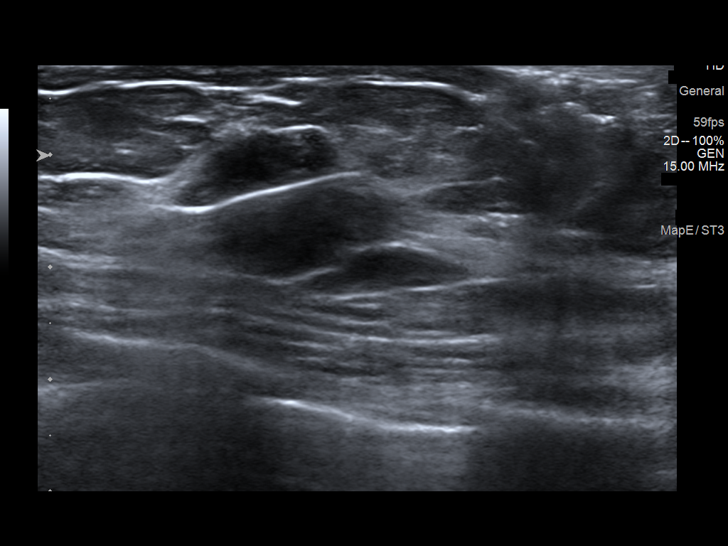
[im 8/10]
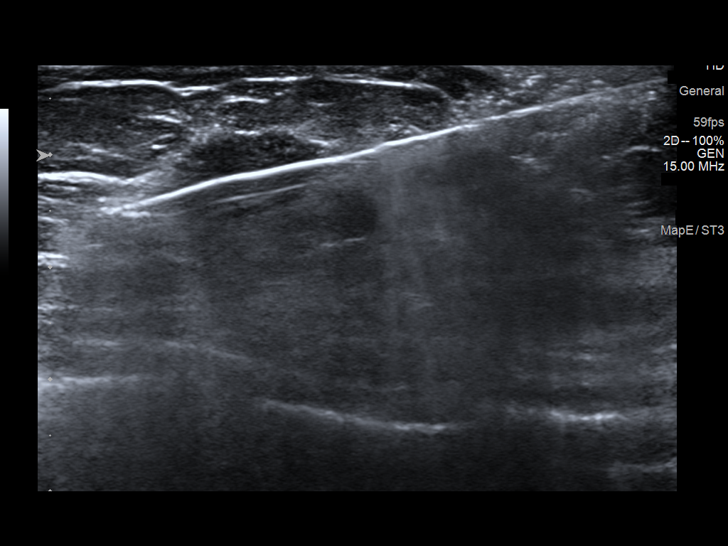
[im 9/10]
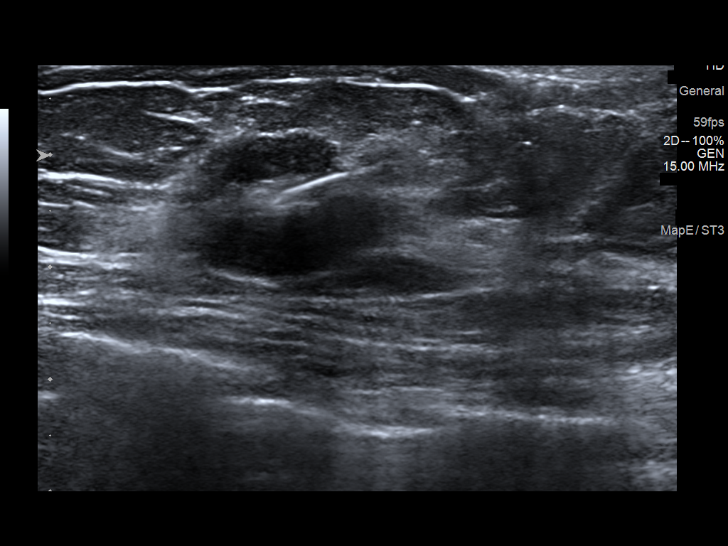
[im 10/10]
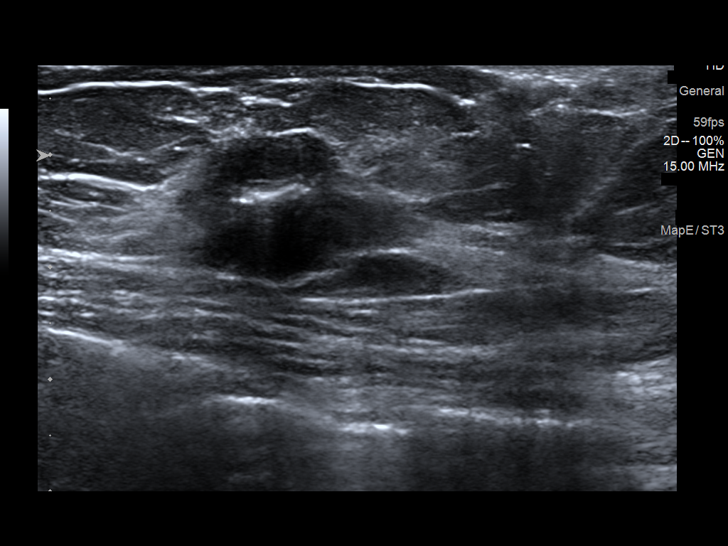

[10 of 10 positions shown; findings below may reference images not displayed]



Additional imaging of the left breast at 12 o'clock 4 cm from the
nipple was performed demonstrating the previously seen dominant
hypoechoic mass. However the second vague irregular mass previously
seen also at 12 o'clock 4 cm from the nipple was less conspicuous
today. Therefore we proceeded with only one biopsy of the dominant
mass at 12 o'clock.

Lesion quadrant: Upper outer quadrant

Using sterile technique and 1% Lidocaine as local anesthetic, under
direct ultrasound visualization, a 12 gauge Obf device was
used to perform biopsy of a left breast mass at 12 o'clock using a
lateral approach. At the conclusion of the procedure a coil tissue
marker clip was deployed into the biopsy cavity. Follow up 2 view
mammogram was performed and dictated separately.
IMPRESSION: 1. Ultrasound guided biopsy of a left breast mass at 12 o'clock. No
apparent complications.

2. The second previously seen vague mass at 12 o'clock 4 cm from the
nipple was less conspicuous on ultrasound today. Therefore biopsy
was performed for the dominant suspicious left breast mass at 12
o'clock. Of note on the post clip mammogram films, no definite
second mass or asymmetry persisted.

ADDENDUM:
Pathology revealed GRADE II INVASIVE MAMMARY CARCINOMA of the Left
breast, 12 o'clock. E-cadherin is POSITIVE supporting a ductal
origin. This was found to be concordant by Dr. Adolfo Miguel Blume.

Pathology results were discussed with the patient by telephone. The
patient reported doing well after the biopsy with tenderness at the
site. Post biopsy instructions and care were reviewed and questions
were answered. The patient was encouraged to call The [REDACTED] for any additional concerns. My direct phone
number was provided.

The patient is scheduled to see Lap[place], Raula and Dr. Ingunn Harpa Ronlor
at [HOSPITAL] [HOSPITAL] on November 23, 2019.

Surgical consultation has been arranged with Dr. Hem Hafeez at
[REDACTED] on November 27, 2019.

Recommendation for a bilateral breast MRI if breast conservation is
desired, to exclude any additional sites of disease, as question of
a satellite lesion was previously raised.

Pathology results reported by Pst Peter Kassy, RN on 11/23/2019.



Additional imaging of the left breast at 12 o'clock 4 cm from the
nipple was performed demonstrating the previously seen dominant
hypoechoic mass. However the second vague irregular mass previously
seen also at 12 o'clock 4 cm from the nipple was less conspicuous
today. Therefore we proceeded with only one biopsy of the dominant
mass at 12 o'clock.

Lesion quadrant: Upper outer quadrant

Using sterile technique and 1% Lidocaine as local anesthetic, under
direct ultrasound visualization, a 12 gauge Obf device was
used to perform biopsy of a left breast mass at 12 o'clock using a
lateral approach. At the conclusion of the procedure a coil tissue
marker clip was deployed into the biopsy cavity. Follow up 2 view
mammogram was performed and dictated separately.
IMPRESSION: 1. Ultrasound guided biopsy of a left breast mass at 12 o'clock. No
apparent complications.

2. The second previously seen vague mass at 12 o'clock 4 cm from the
nipple was less conspicuous on ultrasound today. Therefore biopsy
was performed for the dominant suspicious left breast mass at 12
o'clock. Of note on the post clip mammogram films, no definite
second mass or asymmetry persisted.

## 2022-04-01 NOTE — Progress Notes (Signed)
Patient Care Team: Tisovec, Fransico Him, MD as PCP - Philomena Doheny, Paulette Blanch, RN as Oncology Nurse Navigator Rockwell Germany, RN as Oncology Nurse Navigator Truitt Merle, MD as Consulting Physician (Hematology) Alla Feeling, NP as Nurse Practitioner (Nurse Practitioner) Donnie Mesa, MD as Consulting Physician (General Surgery) [place], [facility] as Consulting Physician (Nephrology)   CHIEF COMPLAINT:   Oncology History Overview Note  Cancer Staging Malignant neoplasm of central left breast [facility]) Staging form: Breast, AJCC 8th Edition - Clinical stage from 11/19/2019: Stage IB (cT2, cN0, cM0, G2, ER+, PR+, HER2-) - Signed by Alla Feeling, NP on 11/23/2019 - Pathologic stage from 12/08/2019: Stage Unknown (pT2, pNX, cM0, G2, ER+, PR+, HER2-) - Signed by Truitt Merle, MD on 12/25/2019    Malignant neoplasm of central left breast ([place])  11/09/2019 Breast US   IMPRESSION: 1. Highly suspicious left breast mass at the 12 o'clock position 4 cm from the nipple. Recommendation is for ultrasound-guided biopsy. 2. Indeterminate hyperechoic mass at the 12 o'clock position 4 cm from the nipple, just superior to the index lesion. This may correspond with an additional asymmetry seen mammographically. Recommendation is for ultrasound-guided biopsy with close attention on post clip films to ensure mammographic correlation. 3. No suspicious left axillary lymphadenopathy. 4. No suspicious findings at the site of the right mastectomy bed palpable lump.   11/19/2019 Cancer Staging   Staging form: Breast, AJCC 8th Edition - Clinical stage from 11/19/2019: Stage IB (cT2, cN0, cM0, G2, ER+, PR+, HER2-) - Signed by Alla Feeling, NP on 11/23/2019   11/19/2019 Initial Biopsy   Diagnosis Breast, left, needle core biopsy, 12 o'clock - INVASIVE MAMMARY CARCINOMA - SEE COMMENT E-cadherin is POSITIVE supporting a ductal origin. PROGNOSTIC INDICATORS Results: IMMUNOHISTOCHEMICAL AND  MORPHOMETRIC ANALYSIS PERFORMED MANUALLY The tumor cells are NEGATIVE for Her2 (1+). Estrogen Receptor: 95%, POSITIVE, STRONG STAINING INTENSITY Progesterone Receptor: 95%, POSITIVE, STRONG STAINING INTENSITY Proliferation Marker Ki67: 10%   11/23/2019 Initial Diagnosis   Malignant neoplasm of left breast ([place])   11/29/2019 Breast MRI   IMPRESSION: 1. 3.1 cm biopsy-proven malignancy within the UPPER LEFT breast. 2. Indeterminate 0.9 cm posterior central/LOWER LEFT breast mass. If breast conservation is desired, 2nd-look ultrasound and possible biopsy is recommended. If this mass is not identified sonographically, than MR guided biopsy would be recommended. 3. No abnormal appearing lymph nodes. 4. RIGHT mastectomy without suspicious abnormalities in the RIGHT mastectomy bed.   12/08/2019 Cancer Staging   Staging form: Breast, AJCC 8th Edition - Pathologic stage from 12/08/2019: Stage Unknown (pT2, pNX, cM0, G2, ER+, PR+, HER2-) - Signed by Truitt Merle, MD on 12/25/2019   12/08/2019 Surgery   Left Mastectomy by Dr Georgette Dover   12/08/2019 Pathology Results   FINAL MICROSCOPIC DIAGNOSIS:   A. BREAST, LEFT, MASTECTOMY:  - Invasive ductal carcinoma, multifocal, 2.8 cm in greatest dimension,  Nottingham grade 2 of 3.  - Margins of resection are not involved.  - See oncology table.  - See oncology table.    03/2020 -  Anti-estrogen oral therapy   Anastrozole 7m once daily starting 01/2020   03/22/2020 Survivorship   SCP delivered by LCira Rue NP       CURRENT THERAPY:   INTERVAL HISTORY Ms. WPendergrastpresents by phone. She underwent wide excision of left chest nodule on 03/06/22 by Dr. TGeorgette Dover Path showed invasive ductal carcinoma measuring 7 mm, involving the dermis. All margins clear. She   ROS   Past Medical History:  Diagnosis  Date  . Adenomatous colon polyp    tubular  . Anxiety   . Arthritis   . Blood transfusion without reported diagnosis   . Breast cancer [facility]) 2007    right mastectomy  . Breast cancer ([place]) 10/2019   left breast IDC  . Cataract   . Depression   . Diabetes mellitus   . Hx of meningitis 2012  . Hyperlipemia   . Hypertension   . Personal history of radiation therapy   . Renal disorder   . 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 ABDOMINAL HYSTERECTOMY  1974   Supracervical  . BREAST BIOPSY Left 02/06/2022   [place] LT BREAST BX W LOC DEV 1ST LESION IMG BX SPEC US GUIDE 02/06/2022 [facility] MAMMOGRAPHY  . BREAST CYST EXCISION Left 03/06/2022   Procedure: EXCISION OF LEFT CHEST WALL NODULE;  Surgeon: Donnie Mesa, MD;  Location: [place];  Service: General;  Laterality: Left;  . BREAST LUMPECTOMY Right 1993  . BREAST SURGERY     Mastectomy  . CHOLECYSTECTOMY    . COLONOSCOPY    . MASTECTOMY Right   . MASTECTOMY, PARTIAL Left 12/08/2019   Procedure: LEFT MASTECTOMY;  Surgeon: Donnie Mesa, MD;  Location: [place];  Service: General;  Laterality: Left;  LMA AND PECTORAL BLOCK  . meningitis    . OOPHORECTOMY     One ovary removed--?side  . Stomach tumor    . UPPER GASTROINTESTINAL ENDOSCOPY       Outpatient Encounter Medications as of 04/04/2022  Medication Sig  . amLODipine (NORVASC) 5 MG tablet Take 10 mg by mouth daily.  . betamethasone dipropionate 0.05 % cream Apply 1 Application topically 2 (two) times daily as needed (psoriasis).  . carvedilol (COREG) 6.25 MG tablet Take 12.5 mg by mouth 2 (two) times daily with a meal.  . Cholecalciferol (D2000 ULTRA STRENGTH) 50 MCG (2000 UT) CAPS Take 2,000 Units by mouth daily.  Marland Kitchen glipiZIDE (GLUCOTROL XL) 10 MG 24 hr tablet Take 10 mg by mouth daily.  . hydrochlorothiazide (HYDRODIURIL) 25 MG tablet Take 25 mg by mouth daily. In the morning  . metFORMIN (GLUCOPHAGE) 500 MG tablet Take 500 mg by mouth daily with breakfast.  . mirtazapine (REMERON) 7.5 MG tablet Take 7.5 mg by mouth at bedtime.  . Multiple  Vitamins-Minerals (CENTRUM SILVER PO) Take 1 tablet by mouth daily.   Glory Rosebush VERIO test strip 2 (two) times daily. for testing  . potassium chloride SA (K-DUR,KLOR-CON) 20 MEQ tablet Take 20 mEq by mouth daily.  . pravastatin (PRAVACHOL) 40 MG tablet Take 40 mg by mouth every evening.  . traMADol (ULTRAM) 50 MG tablet Take 1 tablet (50 mg total) by mouth every 6 (six) hours as needed.   No facility-administered encounter medications on file as of 04/04/2022.     There were no vitals filed for this visit. There is no height or weight on file to calculate BMI.   PHYSICAL EXAM GENERAL:alert, no distress and comfortable SKIN: no rash  EYES: sclera clear NECK: without mass LYMPH:  no palpable cervical or supraclavicular lymphadenopathy  LUNGS: clear with normal breathing effort HEART: regular rate & rhythm, no lower extremity edema ABDOMEN: abdomen soft, non-tender and normal bowel sounds NEURO: alert & oriented x 3 with fluent speech, no focal motor/sensory deficits Breast exam:  PAC without erythema    CBC    Component Value Date/Time   WBC  8.4 02/10/2022 0030   RBC 5.11 02/10/2022 0030   HGB 13.9 03/06/2022 1110   HGB 15.9 11/05/2016 1040   HCT 41.0 03/06/2022 1110   HCT 48.9 (H) 11/05/2016 1040   PLT 331 02/10/2022 0030   PLT 281 11/05/2016 1040   MCV 83.0 02/10/2022 0030   MCV 82.9 11/05/2016 1040   MCH 26.6 02/10/2022 0030   MCHC 32.1 02/10/2022 0030   RDW 13.5 02/10/2022 0030   RDW 13.8 11/05/2016 1040   LYMPHSABS 1.6 01/04/2022 0853   LYMPHSABS 2.4 11/05/2016 1040   MONOABS 0.9 01/04/2022 0853   MONOABS 0.5 11/05/2016 1040   EOSABS 0.1 01/04/2022 0853   EOSABS 0.1 11/05/2016 1040   BASOSABS 0.1 01/04/2022 0853   BASOSABS 0.1 11/05/2016 1040     CMP     Component Value Date/Time   NA 142 03/06/2022 1110   NA 142 11/05/2016 1040   K 3.8 03/06/2022 1110   K 4.1 11/05/2016 1040   CL 104 03/06/2022 1110   CL 109 (H) 10/23/2011 1527   CO2 24 02/10/2022  0030   CO2 25 11/05/2016 1040   GLUCOSE 177 (H) 03/06/2022 1110   GLUCOSE 127 11/05/2016 1040   GLUCOSE 107 (H) 10/23/2011 1527   BUN 16 03/06/2022 1110   BUN 17.9 11/05/2016 1040   CREATININE 0.70 03/06/2022 1110   CREATININE 0.9 11/05/2016 1040   CALCIUM 9.8 02/10/2022 0030   CALCIUM 10.3 11/05/2016 1040   PROT 6.9 01/04/2022 0853   PROT 7.6 11/05/2016 1040   ALBUMIN 4.0 01/04/2022 0853   ALBUMIN 4.1 11/05/2016 1040   AST 18 01/04/2022 0853   AST 21 11/05/2016 1040   ALT 10 01/04/2022 0853   ALT 17 11/05/2016 1040   ALKPHOS 43 01/04/2022 0853   ALKPHOS 55 11/05/2016 1040   BILITOT 0.4 01/04/2022 0853   BILITOT 0.61 11/05/2016 1040   GFRNONAA >60 02/10/2022 0030   GFRAA >60 11/23/2019 1049     ASSESSMENT & PLAN:  PLAN:  No orders of the defined types were placed in this encounter.     All questions were answered. The patient knows to call the clinic with any problems, questions or concerns. No barriers to learning were detected. I spent *** counseling the patient face to face. The total time spent in the appointment was *** and more than 50% was on counseling, review of test results, and coordination of care.   Cira Rue, NP-C @DATE$ @

## 2022-04-02 ENCOUNTER — Emergency Department (HOSPITAL_BASED_OUTPATIENT_CLINIC_OR_DEPARTMENT_OTHER)
Admission: EM | Admit: 2022-04-02 | Discharge: 2022-04-03 | Disposition: A | Discharge status: Home / Self Care | Payer: Medicare Other | Attending: Emergency Medicine | Admitting: Emergency Medicine

## 2022-04-02 ENCOUNTER — Other Ambulatory Visit: Payer: Self-pay

## 2022-04-02 ENCOUNTER — Encounter (HOSPITAL_BASED_OUTPATIENT_CLINIC_OR_DEPARTMENT_OTHER): Payer: Self-pay

## 2022-04-02 ENCOUNTER — Emergency Department (HOSPITAL_BASED_OUTPATIENT_CLINIC_OR_DEPARTMENT_OTHER): Payer: Medicare Other | Admitting: Radiology

## 2022-04-02 DIAGNOSIS — E119 Type 2 diabetes mellitus without complications: Secondary | ICD-10-CM | POA: Insufficient documentation

## 2022-04-02 DIAGNOSIS — M79632 Pain in left forearm: Secondary | ICD-10-CM | POA: Insufficient documentation

## 2022-04-02 DIAGNOSIS — Z7984 Long term (current) use of oral hypoglycemic drugs: Secondary | ICD-10-CM | POA: Insufficient documentation

## 2022-04-02 DIAGNOSIS — Z853 Personal history of malignant neoplasm of breast: Secondary | ICD-10-CM | POA: Diagnosis not present

## 2022-04-02 DIAGNOSIS — Z79899 Other long term (current) drug therapy: Secondary | ICD-10-CM | POA: Insufficient documentation

## 2022-04-02 DIAGNOSIS — I10 Essential (primary) hypertension: Secondary | ICD-10-CM | POA: Insufficient documentation

## 2022-04-02 DIAGNOSIS — R079 Chest pain, unspecified: Secondary | ICD-10-CM | POA: Diagnosis not present

## 2022-04-02 LAB — CBC
HCT: 44 % (ref 36.0–46.0)
Hemoglobin: 14 g/dL (ref 12.0–15.0)
MCH: 26.9 pg (ref 26.0–34.0)
MCHC: 31.8 g/dL (ref 30.0–36.0)
MCV: 84.5 fL (ref 80.0–100.0)
Platelets: 340 10*3/uL (ref 150–400)
RBC: 5.21 MIL/uL — ABNORMAL HIGH (ref 3.87–5.11)
RDW: 13.7 % (ref 11.5–15.5)
WBC: 7.7 10*3/uL (ref 4.0–10.5)
nRBC: 0 % (ref 0.0–0.2)

## 2022-04-02 LAB — BASIC METABOLIC PANEL
Anion gap: 10 (ref 5–15)
BUN: 14 mg/dL (ref 8–23)
CO2: 26 mmol/L (ref 22–32)
Calcium: 10.5 mg/dL — ABNORMAL HIGH (ref 8.9–10.3)
Chloride: 104 mmol/L (ref 98–111)
Creatinine, Ser: 0.86 mg/dL (ref 0.44–1.00)
GFR, Estimated: >60 mL/min (ref >60)
Glucose, Bld: 189 mg/dL — ABNORMAL HIGH (ref 70–99)
Potassium: 4 mmol/L (ref 3.5–5.1)
Sodium: 140 mmol/L (ref 135–145)

## 2022-04-02 LAB — TROPONIN I (HIGH SENSITIVITY): Troponin I (High Sensitivity): 6 ng/L (ref <18)

## 2022-04-02 MED ORDER — ACETAMINOPHEN 500 MG PO TABS
1000.0000 mg | ORAL_TABLET | Freq: Once | ORAL | Status: AC
  Administered 2022-04-02: 1000 mg via ORAL
  Filled 2022-04-02: qty 2

## 2022-04-02 NOTE — ED Triage Notes (Signed)
Patient here POV from Home.  Endorses Arm Pain to left Arm that began today. States it was originally to Bilateral lower Arms yesterday but it subsided. Began again today to left Arm only at 1700.  No CP. No SOB. No Trauma.   NAD Noted during Triage. A&Ox4. GCS 15. Ambulatory.

## 2022-04-02 NOTE — ED Provider Notes (Signed)
[facility] Provider Note  CSN: [id number] Arrival date & time: 04/02/22 1902  Chief Complaint(s) Arm Pain  HPI Cindy Wong is a 89 y.o. female {Add pertinent medical, surgical, social history, OB history to HPI:1}   The history is provided by the patient.  Arm Pain This is a new problem. The current episode started yesterday. Episode frequency: intermittent. Lasted a few hours yesterday; resolved. returned this evening at 1700. The problem has not changed since onset.Pertinent negatives include no chest pain, no abdominal pain, no headaches and no shortness of breath. Exacerbated by: touching forearm. She has tried nothing for the symptoms. The treatment provided no relief.    Past Medical History Past Medical History:  Diagnosis Date   Adenomatous colon polyp    tubular   Anxiety    Arthritis    Blood transfusion without reported diagnosis    Breast cancer ([place]) 2007   right mastectomy   Breast cancer ([place]) 10/2019   left breast IDC   Cataract    Depression    Diabetes mellitus    Hx of meningitis 2012   Hyperlipemia    Hypertension    Personal history of radiation therapy    Renal disorder    Stomach cancer ([place]) 1997   partial gastrectomy   Patient Active Problem List   Diagnosis Date Noted   Heloma durum 12/26/2021   Pain in both lower extremities 04/27/2020   Invasive ductal carcinoma of breast, female, left ([place]) 12/08/2019   Malignant neoplasm of central left breast ([place]) 11/23/2019   Abnormal stress test 10/16/2018   PVC's (premature ventricular contractions) 09/15/2018   History of ductal carcinoma in situ (DCIS) of breast 10/27/2015   Personal history of malignant gastrointestinal stromal tumor (GIST) 10/27/2015   DCIS (ductal carcinoma in situ) 10/23/2011   Fibroid    Diabetes mellitus    Cancer ([place])    Essential hypertension    Meningitis due to bacterium 02/05/2011   Chronic mastoiditis of right  side 02/05/2011   BLOOD IN STOOL 07/25/2009   Home Medication(s) Prior to Admission medications   Medication Sig Start Date End Date Taking? Authorizing Provider  amLODipine (NORVASC) 5 MG tablet Take 10 mg by mouth daily.    [provider]  betamethasone dipropionate 0.05 % cream Apply 1 Application topically 2 (two) times daily as needed (psoriasis). 01/18/22   [provider]  carvedilol (COREG) 6.25 MG tablet Take 12.5 mg by mouth 2 (two) times daily with a meal.    [provider]  Cholecalciferol (D2000 ULTRA STRENGTH) 50 MCG (2000 UT) CAPS Take 2,000 Units by mouth daily.    [provider]  glipiZIDE (GLUCOTROL XL) 10 MG 24 hr tablet Take 10 mg by mouth daily.    [provider]  hydrochlorothiazide (HYDRODIURIL) 25 MG tablet Take 25 mg by mouth daily. In the morning 10/14/17   [provider]  metFORMIN (GLUCOPHAGE) 500 MG tablet Take 500 mg by mouth daily with breakfast. 02/13/22   [provider]  mirtazapine (REMERON) 7.5 MG tablet Take 7.5 mg by mouth at bedtime. 10/04/21   [provider]  Multiple Vitamins-Minerals (CENTRUM SILVER PO) Take 1 tablet by mouth daily.     [provider]  [facility] VERIO test strip 2 (two) times daily. for testing 08/11/17   [provider]  potassium chloride SA (K-DUR,KLOR-CON) 20 MEQ tablet Take 20 mEq by mouth daily.    [provider]  pravastatin (  PRAVACHOL) 40 MG tablet Take 40 mg by mouth every evening.    [provider]  traMADol (ULTRAM) 50 MG tablet Take 1 tablet (50 mg total) by mouth every 6 (six) hours as needed. 03/06/22 03/06/23  Donnie Mesa, MD                                                                                                                                    Allergies Sulfamethoxazole and Sulfonamide derivatives  Review of Systems Review of Systems  Respiratory:  Negative for shortness of breath.    Cardiovascular:  Negative for chest pain.  Gastrointestinal:  Negative for abdominal pain.  Neurological:  Negative for headaches.   As noted in HPI  Physical Exam Vital Signs  I have reviewed the triage vital signs BP (!) 173/75   Pulse 64   Temp 98.4 F (36.9 C)   Resp 18   Ht 5' 8"$  (1.727 m)   Wt 67.2 kg   SpO2 100%   BMI 22.53 kg/m  *** Physical Exam Vitals reviewed.  Constitutional:      General: She is not in acute distress.    Appearance: She is well-developed. She is not diaphoretic.  HENT:     Head: Normocephalic and atraumatic.     Nose: Nose normal.  Eyes:     General: No scleral icterus.       Right eye: No discharge.        Left eye: No discharge.     Conjunctiva/sclera: Conjunctivae normal.     Pupils: Pupils are equal, round, and reactive to light.  Cardiovascular:     Rate and Rhythm: Normal rate and regular rhythm.     Heart sounds: No murmur heard.    No friction rub. No gallop.  Pulmonary:     Effort: Pulmonary effort is normal. No respiratory distress.     Breath sounds: Normal breath sounds. No stridor. No rales.  Abdominal:     General: There is no distension.     Palpations: Abdomen is soft.     Tenderness: There is no abdominal tenderness.  Musculoskeletal:     Right forearm: No tenderness.     Left forearm: Tenderness present. No swelling, edema, lacerations or bony tenderness.     Right hand: Normal strength. Normal sensation. Normal pulse.     Left hand: Normal strength. Normal sensation. Normal pulse.     Cervical back: Normal range of motion and neck supple.  Skin:    General: Skin is warm and dry.     Findings: No erythema or rash.  Neurological:     Mental Status: She is alert and oriented to person, place, and time.     ED Results and Treatments Labs (all labs ordered are listed, but only abnormal results are displayed) Labs Reviewed  BASIC METABOLIC PANEL - Abnormal; Notable for the following components:      Result  Value   Glucose, Bld 189 (*)    Calcium 10.5 (*)    All other components within normal limits  CBC - Abnormal; Notable for the following components:   RBC 5.21 (*)    All other components within normal limits  TROPONIN I (HIGH SENSITIVITY)  TROPONIN I (HIGH SENSITIVITY)                                                                                                                         EKG  EKG Interpretation  Date/Time:  Monday April 02 2022 19:13:08 EST Ventricular Rate:  78 PR Interval:  190 QRS Duration: 76 QT Interval:  384 QTC Calculation: 437 R Axis:   32 Text Interpretation: Normal sinus rhythm Biatrial enlargement Abnormal ECG When compared with ECG of 09-Feb-2022 23:13, No significant change was found agree Confirmed by Charlesetta Shanks [phone]) on 04/02/2022 10:53:57 PM       Radiology DG Chest 2 View  Result Date: 04/02/2022 CLINICAL DATA:  Chest pain EXAM: CHEST - 2 VIEW COMPARISON:  05/08/2018 FINDINGS: The heart size and mediastinal contours are within normal limits. Both lungs are clear. The visualized skeletal structures are unremarkable. IMPRESSION: No acute abnormality of the lungs. Electronically Signed   By: Delanna Ahmadi M.D.   On: 04/02/2022 19:47    Medications Ordered in ED Medications  acetaminophen (TYLENOL) tablet 1,000 mg (has no administration in time range)                                                                                                                                     Procedures Procedures  (including critical care time)  Medical Decision Making / ED Course   Medical Decision Making Amount and/or Complexity of Data Reviewed Labs: ordered. Radiology: ordered.          Final Clinical Impression(s) / ED Diagnoses Final diagnoses:  None    {Document critical care time when appropriate:1}  {Document review of labs and clinical decision tools ie heart score, Chads2Vasc2 etc:1}  {Document your independent review  of radiology images, and any outside records:1} {Document your discussion with family members, caretakers, and with consultants:1} {Document social determinants of health affecting pt's care:1} {Document your decision making why or why not admission, treatments were needed:1} This chart was dictated using voice recognition software.  Despite best efforts to proofread,  errors can occur which can change the  documentation meaning.

## 2022-04-03 ENCOUNTER — Encounter: Payer: Self-pay | Admitting: Podiatry

## 2022-04-03 ENCOUNTER — Ambulatory Visit (INDEPENDENT_AMBULATORY_CARE_PROVIDER_SITE_OTHER): Payer: Medicare Other | Admitting: Podiatry

## 2022-04-03 DIAGNOSIS — E1151 Type 2 diabetes mellitus with diabetic peripheral angiopathy without gangrene: Secondary | ICD-10-CM

## 2022-04-03 DIAGNOSIS — B351 Tinea unguium: Secondary | ICD-10-CM | POA: Diagnosis not present

## 2022-04-03 DIAGNOSIS — M79676 Pain in unspecified toe(s): Secondary | ICD-10-CM

## 2022-04-03 DIAGNOSIS — Q828 Other specified congenital malformations of skin: Secondary | ICD-10-CM

## 2022-04-03 DIAGNOSIS — L84 Corns and callosities: Secondary | ICD-10-CM

## 2022-04-03 LAB — TROPONIN I (HIGH SENSITIVITY): Troponin I (High Sensitivity): 6 ng/L (ref <18)

## 2022-04-03 NOTE — ED Notes (Signed)
Patient verbalizes understanding of discharge instructions. Opportunity for questioning and answers were provided. Armband removed by staff, pt discharged from ED. Wheeled out to lobby  

## 2022-04-03 NOTE — Discharge Instructions (Signed)
You may use over-the-counter Motrin (Ibuprofen), Acetaminophen (Tylenol), topical muscle creams such as SalonPas, [employer], Bengay, etc. Please stretch, apply ice or heat (whichever helps), and have massage therapy for additional assistance.

## 2022-04-03 NOTE — Progress Notes (Signed)
This patient returns to my office for at risk foot care.  This patient requires this care by a professional since this patient will be at risk due to having diabetes.   This patient is unable to cut nails herself since the patient cannot reach her nails.  These nails is painful walking and wearing her shoes. . This patient presents for at risk foot care today.  General Appearance  Alert, conversant and in no acute stress.  Vascular  Dorsalis pedis  pulses are palpable  Bilaterally. Posterior tibial pulses are absent  B/L.  Capillary return is within normal limits  bilaterally. Temperature is within normal limits  Bilaterally  .Absent digital hair.  Neurologic  Senn-Weinstein monofilament wire test within normal limits  bilaterally. Muscle power within normal limits bilaterally.  Nails Thick disfigured discolored nails with subungual debris  from hallux to fifth toes bilaterally. No evidence of bacterial infection or drainage bilaterally.  Orthopedic  No limitations of motion  feet .  No crepitus or effusions noted.  No bony pathology or digital deformities noted.  midfoot arthritis  B/L.  Skin  normotropic skin with no porokeratosis noted bilaterally.  No signs of infections or ulcers noted.    Symptomatic  HD 5th toe left.  Porokeratosis  5th  MPJ  B/L.  Onychomycosis  Pain in right toes  Pain in left toes  .Heloma durum fifth toe left foot.  Porokeratosis sub 5th  B/L.  Consent was obtained for treatment procedures.   Mechanical debridement of nails 1-5  bilaterally performed with a nail nipper.  Filed with dremel without incident. Debride HD with # 15 blade and dremel tool.   Return office visit    3 months                  Told patient to return for periodic foot care and evaluation due to potential at risk complications.   Gardiner Barefoot DPM

## 2022-04-04 ENCOUNTER — Telehealth: Payer: Self-pay | Admitting: Nurse Practitioner

## 2022-04-04 ENCOUNTER — Inpatient Hospital Stay: Payer: Medicare Other | Attending: Nurse Practitioner | Admitting: Nurse Practitioner

## 2022-04-04 ENCOUNTER — Encounter: Payer: Self-pay | Admitting: Nurse Practitioner

## 2022-04-04 DIAGNOSIS — C50912 Malignant neoplasm of unspecified site of left female breast: Secondary | ICD-10-CM

## 2022-04-04 DIAGNOSIS — Z170 Estrogen receptor positive status [ER+]: Secondary | ICD-10-CM | POA: Diagnosis not present

## 2022-04-04 MED ORDER — TAMOXIFEN CITRATE 20 MG PO TABS: 20.0000 mg | ORAL_TABLET | Freq: Every day | ORAL | 3 refills | Status: DC

## 2022-04-04 NOTE — Telephone Encounter (Signed)
Per 04/04/22 IB Scheduled patient, patient aware and confirmed.

## 2022-04-05 DIAGNOSIS — E1122 Type 2 diabetes mellitus with diabetic chronic kidney disease: Secondary | ICD-10-CM | POA: Diagnosis not present

## 2022-04-05 DIAGNOSIS — E559 Vitamin D deficiency, unspecified: Secondary | ICD-10-CM | POA: Diagnosis not present

## 2022-04-05 DIAGNOSIS — N179 Acute kidney failure, unspecified: Secondary | ICD-10-CM | POA: Diagnosis not present

## 2022-04-05 DIAGNOSIS — I129 Hypertensive chronic kidney disease with stage 1 through stage 4 chronic kidney disease, or unspecified chronic kidney disease: Secondary | ICD-10-CM | POA: Diagnosis not present

## 2022-04-05 DIAGNOSIS — E870 Hyperosmolality and hypernatremia: Secondary | ICD-10-CM | POA: Diagnosis not present

## 2022-04-05 DIAGNOSIS — C50911 Malignant neoplasm of unspecified site of right female breast: Secondary | ICD-10-CM | POA: Diagnosis not present

## 2022-04-09 ENCOUNTER — Ambulatory Visit: Payer: Medicare Other

## 2022-04-09 DIAGNOSIS — I10 Essential (primary) hypertension: Secondary | ICD-10-CM

## 2022-04-10 DIAGNOSIS — E559 Vitamin D deficiency, unspecified: Secondary | ICD-10-CM | POA: Diagnosis not present

## 2022-04-10 DIAGNOSIS — E7800 Pure hypercholesterolemia, unspecified: Secondary | ICD-10-CM | POA: Diagnosis not present

## 2022-04-10 DIAGNOSIS — C50919 Malignant neoplasm of unspecified site of unspecified female breast: Secondary | ICD-10-CM | POA: Diagnosis not present

## 2022-04-10 DIAGNOSIS — H9191 Unspecified hearing loss, right ear: Secondary | ICD-10-CM | POA: Diagnosis not present

## 2022-04-10 DIAGNOSIS — Z853 Personal history of malignant neoplasm of breast: Secondary | ICD-10-CM | POA: Diagnosis not present

## 2022-04-10 DIAGNOSIS — D692 Other nonthrombocytopenic purpura: Secondary | ICD-10-CM | POA: Diagnosis not present

## 2022-04-10 DIAGNOSIS — I10 Essential (primary) hypertension: Secondary | ICD-10-CM | POA: Diagnosis not present

## 2022-04-10 DIAGNOSIS — F411 Generalized anxiety disorder: Secondary | ICD-10-CM | POA: Diagnosis not present

## 2022-04-10 DIAGNOSIS — F331 Major depressive disorder, recurrent, moderate: Secondary | ICD-10-CM | POA: Diagnosis not present

## 2022-04-10 DIAGNOSIS — K222 Esophageal obstruction: Secondary | ICD-10-CM | POA: Diagnosis not present

## 2022-04-10 DIAGNOSIS — E119 Type 2 diabetes mellitus without complications: Secondary | ICD-10-CM | POA: Diagnosis not present

## 2022-04-10 DIAGNOSIS — Z9010 Acquired absence of unspecified breast and nipple: Secondary | ICD-10-CM | POA: Diagnosis not present

## 2022-04-10 NOTE — Progress Notes (Signed)
Called patient to inform her about her echo results

## 2022-04-16 ENCOUNTER — Other Ambulatory Visit: Payer: Self-pay

## 2022-04-16 ENCOUNTER — Inpatient Hospital Stay: Payer: Medicare Other

## 2022-04-16 NOTE — Progress Notes (Signed)
Nutrition Assessment:  Referral for healthy eating  89 year old female with recurrent breast cancer.  S/p wide excision of left chest nodule on 03/06/22.  Past medical history of DM, HLD, HTN, partial gastrectomy.  Planning to start tamoxifen.  Met with patient face to face visit.  Reports that she has a good appetite.  Had not been feeling good over the last few months but has been improving.  Reports this morning ate scrambled egg, toast and fatback meat for breakfast. Had leftover macaroni and cheese, collards and chicken for lunch.  Has bought some glucerna shakes to drink as well if she gets hungry or does not feel like cooking.  Patient with questions regarding healthy diet.    Reports that she has been released from the kidney doctor  Medications: reviewed  Labs: reviewed  Anthropometrics:   Height: 68 inches Weight: 148 lb 2.4 oz UBW: 145-151 lb BMI: 22   NUTRITION DIAGNOSIS: Food and nutrition related knowledge deficit related to breast cancer as evidenced by request for information on healthy eating   INTERVENTION:  Discussed AICR guidelines for healthy eating.  Handout provided.  Discussed easy to prepare meals Encouraged patient to keep glucerna shakes on hand Contact information provided    NEXT VISIT: no follow-up RD available as needed  Bevan Vu B. Zenia Resides, [place], [place] Registered Dietitian [phone]

## 2022-04-19 DIAGNOSIS — L910 Hypertrophic scar: Secondary | ICD-10-CM | POA: Diagnosis not present

## 2022-04-26 DIAGNOSIS — I10 Essential (primary) hypertension: Secondary | ICD-10-CM | POA: Diagnosis not present

## 2022-05-02 ENCOUNTER — Ambulatory Visit: Payer: Medicare Other | Admitting: Hematology

## 2022-05-02 ENCOUNTER — Other Ambulatory Visit: Payer: Medicare Other

## 2022-05-08 DIAGNOSIS — F331 Major depressive disorder, recurrent, moderate: Secondary | ICD-10-CM | POA: Diagnosis not present

## 2022-05-08 DIAGNOSIS — C50919 Malignant neoplasm of unspecified site of unspecified female breast: Secondary | ICD-10-CM | POA: Diagnosis not present

## 2022-05-08 DIAGNOSIS — I10 Essential (primary) hypertension: Secondary | ICD-10-CM | POA: Diagnosis not present

## 2022-05-08 DIAGNOSIS — E119 Type 2 diabetes mellitus without complications: Secondary | ICD-10-CM | POA: Diagnosis not present

## 2022-05-08 DIAGNOSIS — F411 Generalized anxiety disorder: Secondary | ICD-10-CM | POA: Diagnosis not present

## 2022-05-08 DIAGNOSIS — Z853 Personal history of malignant neoplasm of breast: Secondary | ICD-10-CM | POA: Diagnosis not present

## 2022-05-22 ENCOUNTER — Other Ambulatory Visit: Payer: Self-pay

## 2022-05-22 DIAGNOSIS — C50912 Malignant neoplasm of unspecified site of left female breast: Secondary | ICD-10-CM

## 2022-05-23 ENCOUNTER — Encounter: Payer: Self-pay | Admitting: Hematology

## 2022-05-23 ENCOUNTER — Inpatient Hospital Stay: Payer: Medicare Other | Attending: Nurse Practitioner

## 2022-05-23 ENCOUNTER — Other Ambulatory Visit: Payer: Self-pay

## 2022-05-23 ENCOUNTER — Inpatient Hospital Stay (HOSPITAL_BASED_OUTPATIENT_CLINIC_OR_DEPARTMENT_OTHER): Payer: Medicare Other | Admitting: Hematology

## 2022-05-23 VITALS — BP 168/63 | HR 72 | Temp 98.1°F | Resp 15 | Wt 152.7 lb

## 2022-05-23 DIAGNOSIS — Z79899 Other long term (current) drug therapy: Secondary | ICD-10-CM | POA: Insufficient documentation

## 2022-05-23 DIAGNOSIS — Z170 Estrogen receptor positive status [ER+]: Secondary | ICD-10-CM

## 2022-05-23 DIAGNOSIS — C50912 Malignant neoplasm of unspecified site of left female breast: Secondary | ICD-10-CM

## 2022-05-23 DIAGNOSIS — Z86000 Personal history of in-situ neoplasm of breast: Secondary | ICD-10-CM

## 2022-05-23 DIAGNOSIS — Z85028 Personal history of other malignant neoplasm of stomach: Secondary | ICD-10-CM | POA: Diagnosis not present

## 2022-05-23 DIAGNOSIS — Z853 Personal history of malignant neoplasm of breast: Secondary | ICD-10-CM | POA: Diagnosis not present

## 2022-05-23 DIAGNOSIS — Z9013 Acquired absence of bilateral breasts and nipples: Secondary | ICD-10-CM | POA: Insufficient documentation

## 2022-05-23 DIAGNOSIS — Z8509 Personal history of malignant neoplasm of other digestive organs: Secondary | ICD-10-CM

## 2022-05-23 DIAGNOSIS — Z90710 Acquired absence of both cervix and uterus: Secondary | ICD-10-CM | POA: Insufficient documentation

## 2022-05-23 DIAGNOSIS — Z9049 Acquired absence of other specified parts of digestive tract: Secondary | ICD-10-CM | POA: Insufficient documentation

## 2022-05-23 DIAGNOSIS — Z882 Allergy status to sulfonamides status: Secondary | ICD-10-CM | POA: Diagnosis not present

## 2022-05-23 DIAGNOSIS — Z923 Personal history of irradiation: Secondary | ICD-10-CM | POA: Diagnosis not present

## 2022-05-23 DIAGNOSIS — Z903 Acquired absence of stomach [part of]: Secondary | ICD-10-CM | POA: Diagnosis not present

## 2022-05-23 DIAGNOSIS — Z90721 Acquired absence of ovaries, unilateral: Secondary | ICD-10-CM | POA: Insufficient documentation

## 2022-05-23 DIAGNOSIS — R232 Flushing: Secondary | ICD-10-CM | POA: Diagnosis not present

## 2022-05-23 DIAGNOSIS — C50112 Malignant neoplasm of central portion of left female breast: Secondary | ICD-10-CM | POA: Insufficient documentation

## 2022-05-23 DIAGNOSIS — I10 Essential (primary) hypertension: Secondary | ICD-10-CM | POA: Diagnosis not present

## 2022-05-23 LAB — CBC WITH DIFFERENTIAL (CANCER CENTER ONLY)
Abs Immature Granulocytes: 0.03 10*3/uL (ref 0.00–0.07)
Basophils Absolute: 0.1 10*3/uL (ref 0.0–0.1)
Basophils Relative: 1 %
Eosinophils Absolute: 0.1 10*3/uL (ref 0.0–0.5)
Eosinophils Relative: 1 %
HCT: 39.3 % (ref 36.0–46.0)
Hemoglobin: 12.5 g/dL (ref 12.0–15.0)
Immature Granulocytes: 0 %
Lymphocytes Relative: 32 %
Lymphs Abs: 2.5 10*3/uL (ref 0.7–4.0)
MCH: 27.1 pg (ref 26.0–34.0)
MCHC: 31.8 g/dL (ref 30.0–36.0)
MCV: 85.2 fL (ref 80.0–100.0)
Monocytes Absolute: 0.6 10*3/uL (ref 0.1–1.0)
Monocytes Relative: 8 %
Neutro Abs: 4.6 10*3/uL (ref 1.7–7.7)
Neutrophils Relative %: 58 %
Platelet Count: 282 10*3/uL (ref 150–400)
RBC: 4.61 MIL/uL (ref 3.87–5.11)
RDW: 12.9 % (ref 11.5–15.5)
WBC Count: 7.9 10*3/uL (ref 4.0–10.5)
nRBC: 0 % (ref 0.0–0.2)

## 2022-05-23 LAB — CMP (CANCER CENTER ONLY)
ALT: 13 U/L (ref 0–44)
AST: 21 U/L (ref 15–41)
Albumin: 3.8 g/dL (ref 3.5–5.0)
Alkaline Phosphatase: 32 U/L — ABNORMAL LOW (ref 38–126)
Anion gap: 9 (ref 5–15)
BUN: 17 mg/dL (ref 8–23)
CO2: 26 mmol/L (ref 22–32)
Calcium: 9.1 mg/dL (ref 8.9–10.3)
Chloride: 105 mmol/L (ref 98–111)
Creatinine: 0.92 mg/dL (ref 0.44–1.00)
GFR, Estimated: 60 mL/min — ABNORMAL LOW (ref >60)
Glucose, Bld: 190 mg/dL — ABNORMAL HIGH (ref 70–99)
Potassium: 3.7 mmol/L (ref 3.5–5.1)
Sodium: 140 mmol/L (ref 135–145)
Total Bilirubin: 0.5 mg/dL (ref 0.3–1.2)
Total Protein: 6.6 g/dL (ref 6.5–8.1)

## 2022-05-23 NOTE — Progress Notes (Signed)
[facility]   [phone] Fax:([phone]) [phone]   Clinic Follow up Note   Patient Care Team: Tisovec, Fransico Him, MD as PCP - Philomena Doheny, Paulette Blanch, RN as Oncology Nurse Navigator Rockwell Germany, RN as Oncology Nurse Navigator Truitt Merle, MD as Consulting Physician (Hematology) Alla Feeling, NP as Nurse Practitioner (Nurse Practitioner) Donnie Mesa, MD as Consulting Physician (General Surgery) [place], [facility] as Consulting Physician (Nephrology)  Date of Service:  05/23/2022  CHIEF COMPLAINT: f/u of recent surgery for local breast cancer recurrence   CURRENT THERAPY:  Tamoxifen 20 mg started 04/22/2022  ASSESSMENT:  Cindy Wong is a 89 y.o. female with   Malignant neoplasm of central left breast ([place]) ER 95% strongly positive, PR 95% strongly positive, HER-2 negative (1+) Ki-67 10%, cT2 N0 M0 stage Ib; recurrence left subQ chest wall 01/2022 -Diagnosed in 10/2019, s/p left mastectomy by Dr Johnny Bridge on 12/08/19. Her surgical path showed grade II IDC, 2.8 cm, with clear margins. Due to negative lymph nodes she did not require postmastectomy radiation.  -She tried anastrozole 07/07/20 - 12/2020, stopped due to SE's (caused sluggishness and difficulty with normal activities). She has been on surveillance alone  -During routine surveillance visit she was found to have localized chest wall subq recurrence of previous IDC, ER/PR both 100% strongly positive, HER2 negative, Ki 67 10% confirmed on biopsy 02/06/22.  -Staging PET scan negative for distant recurrence -She underwent wide excision of left chest wall recurrence by Dr. Georgette Dover on 03/06/22, I reviewed her path which shows 0.7 cm IDA involving dermis of the skin, all margins negative -she started tamoxifen in late Jan 2024, she is tolerating well. -He is clinically doing well, exam was unremarkable, no clinical concern for recurrence -No mammogram needed since she had double  Mastectomy  History of ductal carcinoma in situ (DCIS) of breast -Diagnosed initially in 1993, s/p lumpectomy and adjuvant radiation. She reportedly tried antiestrogen therapy but did not tolerate well. -She had a recurrence of ER/PR positive DCIS in 07/2005, s/p right mastectomy on 7/6/200  Personal history of malignant gastrointestinal stromal tumor (GIST) -Diagnosed in 1997, s/p partial gastrectomy -NED so far     PLAN: - lab reviewed -Continue tamoxifen -lab,f/u in 3 months     SUMMARY OF ONCOLOGIC HISTORY: Oncology History Overview Note  Cancer Staging Malignant neoplasm of central left breast ([place]) Staging form: Breast, AJCC 8th Edition - Clinical stage from 11/19/2019: Stage IB (cT2, cN0, cM0, G2, ER+, PR+, HER2-) - Signed by Alla Feeling, NP on 11/23/2019 - Pathologic stage from 12/08/2019: Stage Unknown (pT2, pNX, cM0, G2, ER+, PR+, HER2-) - Signed by Truitt Merle, MD on 12/25/2019    Malignant neoplasm of central left breast ([place])  11/09/2019 Breast US   IMPRESSION: 1. Highly suspicious left breast mass at the 12 o'clock position 4 cm from the nipple. Recommendation is for ultrasound-guided biopsy. 2. Indeterminate hyperechoic mass at the 12 o'clock position 4 cm from the nipple, just superior to the index lesion. This may correspond with an additional asymmetry seen mammographically. Recommendation is for ultrasound-guided biopsy with close attention on post clip films to ensure mammographic correlation. 3. No suspicious left axillary lymphadenopathy. 4. No suspicious findings at the site of the right mastectomy bed palpable lump.   11/19/2019 Cancer Staging   Staging form: Breast, AJCC 8th Edition - Clinical stage from 11/19/2019: Stage IB (cT2, cN0, cM0, G2, ER+, PR+, HER2-) - Signed by Alla Feeling, NP on 11/23/2019  11/19/2019 Initial Biopsy   Diagnosis Breast, left, needle core biopsy, 12 o'clock - INVASIVE MAMMARY CARCINOMA - SEE COMMENT E-cadherin is  POSITIVE supporting a ductal origin. PROGNOSTIC INDICATORS Results: IMMUNOHISTOCHEMICAL AND MORPHOMETRIC ANALYSIS PERFORMED MANUALLY The tumor cells are NEGATIVE for Her2 (1+). Estrogen Receptor: 95%, POSITIVE, STRONG STAINING INTENSITY Progesterone Receptor: 95%, POSITIVE, STRONG STAINING INTENSITY Proliferation Marker Ki67: 10%   11/23/2019 Initial Diagnosis   Malignant neoplasm of left breast ([place])   11/29/2019 Breast MRI   IMPRESSION: 1. 3.1 cm biopsy-proven malignancy within the UPPER LEFT breast. 2. Indeterminate 0.9 cm posterior central/LOWER LEFT breast mass. If breast conservation is desired, 2nd-look ultrasound and possible biopsy is recommended. If this mass is not identified sonographically, than MR guided biopsy would be recommended. 3. No abnormal appearing lymph nodes. 4. RIGHT mastectomy without suspicious abnormalities in the RIGHT mastectomy bed.   12/08/2019 Cancer Staging   Staging form: Breast, AJCC 8th Edition - Pathologic stage from 12/08/2019: Stage Unknown (pT2, pNX, cM0, G2, ER+, PR+, HER2-) - Signed by Truitt Merle, MD on 12/25/2019   12/08/2019 Surgery   Left Mastectomy by Dr Georgette Dover   12/08/2019 Pathology Results   FINAL MICROSCOPIC DIAGNOSIS:   A. BREAST, LEFT, MASTECTOMY:  - Invasive ductal carcinoma, multifocal, 2.8 cm in greatest dimension,  [place]tingham grade 2 of 3.  - Margins of resection are not involved.  - See oncology table.  - See oncology table.    03/2020 -  Anti-estrogen oral therapy   Anastrozole 1mg  once daily starting 01/2020   03/22/2020 Survivorship   SCP delivered by Cira Rue, NP       INTERVAL HISTORY:  Cindy Wong is here for a follow up of recent surgery for local breast cancer recurrence  She was last seen by  NP Lacie on 04/04/2022 She presents to the clinic alone. Pt state she had some hot flashes but it is tolerable.Pt has no other concerns.    All other systems were reviewed with the patient and are  negative.  MEDICAL HISTORY:  Past Medical History:  Diagnosis Date   Adenomatous colon polyp    tubular   Anxiety    Arthritis    Blood transfusion without reported diagnosis    Breast cancer 2007   right mastectomy   Breast cancer 10/2019   left breast IDC   Cataract    Depression    Diabetes mellitus    Hx of meningitis 2012   Hyperlipemia    Hypertension    Personal history of radiation therapy    Renal disorder    Stomach cancer 1997   partial gastrectomy    SURGICAL HISTORY: Past Surgical History:  Procedure Laterality Date   ABDOMINAL HYSTERECTOMY  1974   Supracervical   BREAST BIOPSY Left 02/06/2022   [place] LT BREAST BX W LOC DEV 1ST LESION IMG BX SPEC US GUIDE 02/06/2022 [facility] MAMMOGRAPHY   BREAST CYST EXCISION Left 03/06/2022   Procedure: EXCISION OF LEFT CHEST WALL NODULE;  Surgeon: Donnie Mesa, MD;  Location: [facility];  Service: General;  Laterality: Left;   BREAST LUMPECTOMY Right 1993   BREAST SURGERY     Mastectomy   CHOLECYSTECTOMY     COLONOSCOPY     MASTECTOMY Right    MASTECTOMY, PARTIAL Left 12/08/2019   Procedure: LEFT MASTECTOMY;  Surgeon: Donnie Mesa, MD;  Location: [facility];  Service: General;  Laterality: Left;  LMA AND PECTORAL BLOCK   meningitis     OOPHORECTOMY  One ovary removed--?side   Stomach tumor     UPPER GASTROINTESTINAL ENDOSCOPY      I have reviewed the social history and family history with the patient and they are unchanged from previous note.  ALLERGIES:  is allergic to sulfamethoxazole and sulfonamide derivatives.  MEDICATIONS:  Current Outpatient Medications  Medication Sig Dispense Refill   amLODipine (NORVASC) 5 MG tablet Take 10 mg by mouth daily.     betamethasone dipropionate 0.05 % cream Apply 1 Application topically 2 (two) times daily as needed (psoriasis).     carvedilol (COREG) 6.25 MG tablet Take 12.5 mg by mouth 2 (two) times daily with a meal.     Cholecalciferol (D2000 ULTRA  STRENGTH) 50 MCG (2000 UT) CAPS Take 2,000 Units by mouth daily.     glipiZIDE (GLUCOTROL XL) 10 MG 24 hr tablet Take 10 mg by mouth daily.     hydrochlorothiazide (HYDRODIURIL) 25 MG tablet Take 25 mg by mouth daily. In the morning  10   metFORMIN (GLUCOPHAGE) 500 MG tablet Take 500 mg by mouth daily with breakfast.     mirtazapine (REMERON) 7.5 MG tablet Take 7.5 mg by mouth at bedtime.     Multiple Vitamins-Minerals (CENTRUM SILVER PO) Take 1 tablet by mouth daily.      ONETOUCH VERIO test strip 2 (two) times daily. for testing  2   potassium chloride SA (K-DUR,KLOR-CON) 20 MEQ tablet Take 20 mEq by mouth daily.     pravastatin (PRAVACHOL) 40 MG tablet Take 40 mg by mouth every evening.     tamoxifen (NOLVADEX) 20 MG tablet Take 1 tablet (20 mg total) by mouth daily. 30 tablet 3   traMADol (ULTRAM) 50 MG tablet Take 1 tablet (50 mg total) by mouth every 6 (six) hours as needed. 20 tablet 0   No current facility-administered medications for this visit.    PHYSICAL EXAMINATION: ECOG PERFORMANCE STATUS: 1 - Symptomatic but completely ambulatory  Vitals:   05/23/22 1410  BP: (!) 168/63  Pulse: 72  Resp: 15  Temp: 98.1 F (36.7 C)  SpO2: 100%   Wt Readings from Last 3 Encounters:  05/23/22 152 lb 11.2 oz (69.3 kg)  04/02/22 148 lb 2.4 oz (67.2 kg)  03/23/22 148 lb 3.2 oz (67.2 kg)     NECK: (-) supple, thyroid normal size, non-tender, without nodularity LYMPH: (-)  no palpable lymphadenopathy in the cervical, axillary  BREAST: Lt breast Mastectomy scar tissue No palpable mass breast exam benign, Rt breast no palpable mass breast exam benign. LABORATORY DATA:  I have reviewed the data as listed    Latest Ref Rng & Units 05/23/2022    1:52 PM 04/02/2022    7:10 PM 03/06/2022   11:10 AM  CBC  WBC 4.0 - 10.5 K/uL 7.9  7.7    Hemoglobin 12.0 - 15.0 g/dL 12.5  14.0  13.9   Hematocrit 36.0 - 46.0 % 39.3  44.0  41.0   Platelets 150 - 400 K/uL 282  340          Latest Ref Rng &  Units 05/23/2022    1:52 PM 04/02/2022    7:10 PM 03/06/2022   11:10 AM  CMP  Glucose 70 - 99 mg/dL 190  189  177   BUN 8 - 23 mg/dL 17  14  16    Creatinine 0.44 - 1.00 mg/dL 0.92  0.86  0.70   Sodium 135 - 145 mmol/L 140  140  142   Potassium 3.5 -  5.1 mmol/L 3.7  4.0  3.8   Chloride 98 - 111 mmol/L 105  104  104   CO2 22 - 32 mmol/L 26  26    Calcium 8.9 - 10.3 mg/dL 9.1  10.5    Total Protein 6.5 - 8.1 g/dL 6.6     Total Bilirubin 0.3 - 1.2 mg/dL 0.5     Alkaline Phos 38 - 126 U/L 32     AST 15 - 41 U/L 21     ALT 0 - 44 U/L 13         RADIOGRAPHIC STUDIES: I have personally reviewed the radiological images as listed and agreed with the findings in the report. No results found.    No orders of the defined types were placed in this encounter.  All questions were answered. The patient knows to call the clinic with any problems, questions or concerns. No barriers to learning was detected. The total time spent in the appointment was 25 minutes.     Truitt Merle, MD 05/23/2022   Felicity Coyer, CMA, am acting as scribe for Truitt Merle, MD.   I have reviewed the above documentation for accuracy and completeness, and I agree with the above.

## 2022-05-23 NOTE — Assessment & Plan Note (Signed)
ER 95% strongly positive, PR 95% strongly positive, HER-2 negative (1+) Ki-67 10%, cT2 N0 M0 stage Ib; recurrence left subQ chest wall 01/2022 -Diagnosed in 10/2019, s/p left mastectomy by Dr Johnny Bridge on 12/08/19. Her surgical path showed grade II IDC, 2.8 cm, with clear margins. Due to negative lymph nodes she did not require postmastectomy radiation.  -She tried anastrozole 07/07/20 - 12/2020, stopped due to SE's (caused sluggishness and difficulty with normal activities). She has been on surveillance alone  -During routine surveillance visit she was found to have localized chest wall subq recurrence of previous IDC, ER/PR both 100% strongly positive, HER2 negative, Ki 67 10% confirmed on biopsy 02/06/22.  -Staging PET scan negative for distant recurrence -She underwent wide excision of left chest wall recurrence by Dr. Georgette Dover on 03/06/22, I reviewed her path which shows 0.7 cm IDA involving dermis of the skin, all margins negative -she started tamoxifen in late Jan 2024

## 2022-05-23 NOTE — Assessment & Plan Note (Signed)
-  Diagnosed in 1997, s/p partial gastrectomy -NED so far

## 2022-05-23 NOTE — Assessment & Plan Note (Signed)
-  Diagnosed initially in 1993, s/p lumpectomy and adjuvant radiation. She reportedly tried antiestrogen therapy but did not tolerate well. -She had a recurrence of ER/PR positive DCIS in 07/2005, s/p right mastectomy on 7/6/200

## 2022-05-26 DIAGNOSIS — I10 Essential (primary) hypertension: Secondary | ICD-10-CM | POA: Diagnosis not present

## 2022-06-05 DIAGNOSIS — L910 Hypertrophic scar: Secondary | ICD-10-CM | POA: Diagnosis not present

## 2022-06-13 DIAGNOSIS — R7989 Other specified abnormal findings of blood chemistry: Secondary | ICD-10-CM | POA: Diagnosis not present

## 2022-06-13 DIAGNOSIS — R5383 Other fatigue: Secondary | ICD-10-CM | POA: Diagnosis not present

## 2022-06-13 DIAGNOSIS — C50919 Malignant neoplasm of unspecified site of unspecified female breast: Secondary | ICD-10-CM | POA: Diagnosis not present

## 2022-06-13 DIAGNOSIS — R34 Anuria and oliguria: Secondary | ICD-10-CM | POA: Diagnosis not present

## 2022-06-13 DIAGNOSIS — Z853 Personal history of malignant neoplasm of breast: Secondary | ICD-10-CM | POA: Diagnosis not present

## 2022-06-13 DIAGNOSIS — D692 Other nonthrombocytopenic purpura: Secondary | ICD-10-CM | POA: Diagnosis not present

## 2022-06-13 DIAGNOSIS — R000 Tachycardia, unspecified: Secondary | ICD-10-CM | POA: Diagnosis not present

## 2022-06-13 DIAGNOSIS — N289 Disorder of kidney and ureter, unspecified: Secondary | ICD-10-CM | POA: Diagnosis not present

## 2022-06-13 DIAGNOSIS — I10 Essential (primary) hypertension: Secondary | ICD-10-CM | POA: Diagnosis not present

## 2022-06-13 DIAGNOSIS — E7800 Pure hypercholesterolemia, unspecified: Secondary | ICD-10-CM | POA: Diagnosis not present

## 2022-06-13 DIAGNOSIS — E119 Type 2 diabetes mellitus without complications: Secondary | ICD-10-CM | POA: Diagnosis not present

## 2022-06-13 DIAGNOSIS — N390 Urinary tract infection, site not specified: Secondary | ICD-10-CM | POA: Diagnosis not present

## 2022-06-25 DIAGNOSIS — I10 Essential (primary) hypertension: Secondary | ICD-10-CM | POA: Diagnosis not present

## 2022-06-29 ENCOUNTER — Encounter: Payer: Self-pay | Admitting: Cardiology

## 2022-06-29 ENCOUNTER — Ambulatory Visit: Payer: Medicare Other | Admitting: Cardiology

## 2022-06-29 VITALS — BP 161/68 | HR 58 | Resp 16 | Ht 68.0 in | Wt 145.0 lb

## 2022-06-29 DIAGNOSIS — E782 Mixed hyperlipidemia: Secondary | ICD-10-CM | POA: Diagnosis not present

## 2022-06-29 DIAGNOSIS — R0989 Other specified symptoms and signs involving the circulatory and respiratory systems: Secondary | ICD-10-CM | POA: Diagnosis not present

## 2022-06-29 DIAGNOSIS — I10 Essential (primary) hypertension: Secondary | ICD-10-CM

## 2022-06-29 MED ORDER — CARVEDILOL 12.5 MG PO TABS: 12.5000 mg | ORAL_TABLET | Freq: Two times a day (BID) | ORAL | 3 refills | Status: DC

## 2022-06-29 NOTE — Progress Notes (Signed)
Follow up visit  Subjective:   Cindy Wong, female    DOB: [birth year], 89 y.o.   MRN: [id number]   Chief Complaint  Patient presents with   Hypertension   Tachycardia     HPI  89 year old African-American female with controlled hypertension, type 2 diabetes mellitus, hyperlipidemia, seen for frequent PVCs/ventricular bigeminy, abnormal stress test  RPM Patient: January 2024 BP Data Average Systolic BP Level 140.15 mmHg Lowest Systolic BP Level 120 mmHg Highest Systolic BP Level 178 mmHg  Feb 2024 Systolic Blood Pressure 135.8 (115.0 - 156.0) Diastolic Blood Pressure 76.9 (65.7 - 89.0) Heart Rate 69.4 (63.0 - 81.0)  Mar 2024 Systolic Blood Pressure 134.9 (115.0 - 158.0) Diastolic Blood Pressure 76.5 (84.6 - 92.0) Heart Rate   69.1 (62.0 - 75.0)  Years ago, patient had an episode of "shaking", while waking up from sleep.  She denies any palpitations, heart racing sensation.  Blood pressure is elevated today, but generally better controlled at home, as detailed above.   Current Outpatient Medications:    amLODipine (NORVASC) 5 MG tablet, Take 10 mg by mouth daily., Disp: , Rfl:    betamethasone dipropionate 0.05 % cream, Apply 1 Application topically 2 (two) times daily as needed (psoriasis)., Disp: , Rfl:    carvedilol (COREG) 6.25 MG tablet, Take 12.5 mg by mouth 2 (two) times daily with a meal., Disp: , Rfl:    Cholecalciferol (D2000 ULTRA STRENGTH) 50 MCG (2000 UT) CAPS, Take 2,000 Units by mouth daily., Disp: , Rfl:    glipiZIDE (GLUCOTROL XL) 10 MG 24 hr tablet, Take 10 mg by mouth daily., Disp: , Rfl:    hydrochlorothiazide (HYDRODIURIL) 25 MG tablet, Take 25 mg by mouth daily. In the morning, Disp: , Rfl: 10   metFORMIN (GLUCOPHAGE) 500 MG tablet, Take 500 mg by mouth daily with breakfast., Disp: , Rfl:    mirtazapine (REMERON) 7.5 MG tablet, Take 7.5 mg by mouth at bedtime., Disp: , Rfl:    Multiple Vitamins-Minerals (CENTRUM SILVER PO), Take 1 tablet by  mouth daily. , Disp: , Rfl:    ONETOUCH VERIO test strip, 2 (two) times daily. for testing, Disp: , Rfl: 2   potassium chloride SA (K-DUR,KLOR-CON) 20 MEQ tablet, Take 20 mEq by mouth daily., Disp: , Rfl:    pravastatin (PRAVACHOL) 40 MG tablet, Take 40 mg by mouth every evening., Disp: , Rfl:    tamoxifen (NOLVADEX) 20 MG tablet, Take 1 tablet (20 mg total) by mouth daily., Disp: 30 tablet, Rfl: 3   traMADol (ULTRAM) 50 MG tablet, Take 1 tablet (50 mg total) by mouth every 6 (six) hours as needed., Disp: 20 tablet, Rfl: 0  Cardiovascular studies:  EKG 03/23/2022: Sinus rhythm 70 bpm  Left atrial enlargement Nonspecific T wave abnormality  Echocardiogram 04/09/2022: Normal LV systolic function with visual EF 60-65%. Left ventricle cavity is normal in size. Normal left ventricular wall thickness. Normal global wall motion. Doppler evidence of grade I (impaired) diastolic dysfunction, normal LAP. Calculated EF 65%. Left atrial cavity is slightly dilated. Trileaflet aortic valve with no regurgitation. Sclerosis of the aortic valve. Trace mitral regurgitation. Mild mitral valve leaflet calcification. Structurally normal tricuspid valve.  Mild tricuspid regurgitation. No evidence of pulmonary hypertension. RVSP measures 31 mmHg. No significant change compared to 07/2020.  ABI 05/13/2020:  This exam reveals moderately decreased perfusion of the right lower  extremity, noted at the anterior tibial and post tibial artery level (ABI  0.79) and moderately decreased perfusion of the left lower  extremity,  noted at the post tibial artery level (ABI 0.79).  There is mildly abnormal biphasic waveform pattern at the level of the  ankles.   Echocardiogram 10/16/2018: Left ventricle cavity is normal in size. Normal left ventricular wall thickness. Normal LV systolic function with EF 55%. Normal global wall motion. Diastolic function assessment limited due to frequent PVC's. Calculated EF  55%. Trileaflet aortic valve with mild thickening. Trace aortic stenosis. No regurgitation noted. Mild tricuspid regurgitation. Trace pulmonic regurgitation.  No evidence of pulmonary hypertension.  Lexiscan Myoview stress test 09/29/2018: Lexiscan stress test was performed. Stress EKG is non-diagnostic, as this is pharmacological stress test. In addition, rest and stress EKG demonstrate sinus rhythm, frequent PVC's, inferolateral nonspecific ST-T changes.  LVEF 63%. SPECT stress and rest images demonstrate very small size, mild intensity, fixed perfusion defect in basal inferoseptal myocardium. While the defect is small, TID of 1.31 with regional perfusion defect is considered high risk finding. Clinical correlation recommended.   EKG 09/15/2018: Sinus rhythm 77 bpm. Frequent PVC  Carotid US 08/14/2018: Right Carotid: Velocities in the right ICA are consistent with a 1-39% stenosis.                Non-hemodynamically significant plaque <50% noted in the CCA. The                ECA appears <50% stenosed.   Left Carotid: Velocities in the left ICA are consistent with a 1-39% stenosis.               Non-hemodynamically significant plaque noted in the CCA.               Heterogenous multinodular thyroid gland incidentally noted.   Recent labs: 05/23/2022: Glucose 190, BUN/Cr 17/0.92. EGFR 60. Na/K 140/3.7. Rest of the CMP normal H/H 12/39. MCV 85. Platelets 282 HbA1C 7.6% Chol 134, TG 123, HDL 51, LDL 61  Review of Systems  Cardiovascular:  Negative for chest pain, dyspnea on exertion, leg swelling, palpitations and syncope.         Vitals:   06/29/22 0941  BP: (!) 161/68  Pulse: (!) 58  Resp: 16  SpO2: 99%     There is no height or weight on file to calculate BMI. Filed Weights   06/29/22 0941  Weight: 145 lb (65.8 kg)     Objective:   Physical Exam Vitals and nursing note reviewed.  Constitutional:      Appearance: She is well-developed.  Neck:     Vascular: No  JVD.  Cardiovascular:     Rate and Rhythm: Normal rate and regular rhythm.     Pulses: Normal pulses and intact distal pulses.          Carotid pulses are  on the left side with bruit.    Heart sounds: Normal heart sounds. No murmur heard. Pulmonary:     Effort: Pulmonary effort is normal.     Breath sounds: Normal breath sounds. No wheezing or rales.           Assessment & Recommendations:   89 year old African-American female with controlled hypertension, type 2 diabetes mellitus, hyperlipidemia, seen for frequent PVCs/ventricular bigeminy, abnormal stress test  Hypertension: I suspect component of whitecoat hypertension, his blood pressure is better controlled at home. No change made today.  Refill carvedilol 12.5 mg twice daily.   Continue amlodipine 5 mg daily.  Mixed hyperlipidemia: Currently on pravastatin 40 mg daily.  Carotid bruit: Check carotid duplex. She has abnormal sensation of  her heart beat in left ear. Will look for any carotid aneurysm.  F/u in 3 months    Elder Negus, MD Pager: [phone] Office: [phone]

## 2022-07-02 ENCOUNTER — Telehealth: Payer: Self-pay

## 2022-07-02 NOTE — Telephone Encounter (Signed)
Patient called in stating she is having pain in her left breast at the surgical site and she is concerned. Has been doing a breast exam but doesn't feel anything out of the ordinary but it is sore. Areas is not discolored. She also has put her Tamoxifen on hold for a few days. She was feeling bad some diarrhea, blood pressure was elevated and she thought that the tamoxifen was causing it.

## 2022-07-03 ENCOUNTER — Encounter: Payer: Self-pay | Admitting: Podiatry

## 2022-07-03 ENCOUNTER — Ambulatory Visit (INDEPENDENT_AMBULATORY_CARE_PROVIDER_SITE_OTHER): Payer: Medicare Other | Admitting: Podiatry

## 2022-07-03 DIAGNOSIS — M79676 Pain in unspecified toe(s): Secondary | ICD-10-CM

## 2022-07-03 DIAGNOSIS — E1151 Type 2 diabetes mellitus with diabetic peripheral angiopathy without gangrene: Secondary | ICD-10-CM

## 2022-07-03 DIAGNOSIS — Q828 Other specified congenital malformations of skin: Secondary | ICD-10-CM | POA: Diagnosis not present

## 2022-07-03 DIAGNOSIS — B351 Tinea unguium: Secondary | ICD-10-CM | POA: Diagnosis not present

## 2022-07-03 NOTE — Progress Notes (Signed)
This patient returns to my office for at risk foot care.  This patient requires this care by a professional since this patient will be at risk due to having diabetes.   This patient is unable to cut nails herself since the patient cannot reach her nails.  These nails is painful walking and wearing her shoes. . This patient presents for at risk foot care today.  General Appearance  Alert, conversant and in no acute stress.  Vascular  Dorsalis pedis  pulses are palpable  Bilaterally. Posterior tibial pulses are absent  B/L.  Capillary return is within normal limits  bilaterally. Temperature is within normal limits  Bilaterally  .Absent digital hair.  Neurologic  Senn-Weinstein monofilament wire test within normal limits  bilaterally. Muscle power within normal limits bilaterally.  Nails Thick disfigured discolored nails with subungual debris  from hallux to fifth toes bilaterally. No evidence of bacterial infection or drainage bilaterally.  Orthopedic  No limitations of motion  feet .  No crepitus or effusions noted.  No bony pathology or digital deformities noted.  midfoot arthritis  B/L.  Skin  normotropic skin with no porokeratosis noted bilaterally.  No signs of infections or ulcers noted.    Asymptomatic  HD 5th toe left.  Porokeratosis  5th  MPJ  B/L.  Onychomycosis  Pain in right toes  Pain in left toes  .  Porokeratosis sub 5th  B/L.  Consent was obtained for treatment procedures.   Mechanical debridement of nails 1-5  bilaterally performed with a nail nipper.  Filed with dremel without incident. Debride porokeratosis with dremel tool.   Return office visit    3 months                  Told patient to return for periodic foot care and evaluation due to potential at risk complications.   Helane Gunther DPM

## 2022-07-03 NOTE — Progress Notes (Unsigned)
Patient Care Team: Tisovec, Adelfa Koh, MD as PCP - Denyse Amass, Margretta Ditty, RN as Oncology Nurse Navigator Donnelly Angelica, RN as Oncology Nurse Navigator Malachy Mood, MD as Consulting Physician (Hematology) Pollyann Samples, NP as Nurse Practitioner (Nurse Practitioner) Manus Rudd, MD as Consulting Physician (General Surgery) [facility], [facility] as Consulting Physician (Nephrology)   CHIEF COMPLAINT: Symptom management for breast pain and tamoxifen side effects   Oncology History Overview Note  Cancer Staging Malignant neoplasm of central left breast [facility]) Staging form: Breast, AJCC 8th Edition - Clinical stage from 11/19/2019: Stage IB (cT2, cN0, cM0, G2, ER+, PR+, HER2-) - Signed by Pollyann Samples, NP on 11/23/2019 - Pathologic stage from 12/08/2019: Stage Unknown (pT2, pNX, cM0, G2, ER+, PR+, HER2-) - Signed by Malachy Mood, MD on 12/25/2019    Malignant neoplasm of central left breast (HCC)  11/09/2019 Breast US   IMPRESSION: 1. Highly suspicious left breast mass at the 12 o'clock position 4 cm from the nipple. Recommendation is for ultrasound-guided biopsy. 2. Indeterminate hyperechoic mass at the 12 o'clock position 4 cm from the nipple, just superior to the index lesion. This may correspond with an additional asymmetry seen mammographically. Recommendation is for ultrasound-guided biopsy with close attention on post clip films to ensure mammographic correlation. 3. No suspicious left axillary lymphadenopathy. 4. No suspicious findings at the site of the right mastectomy bed palpable lump.   11/19/2019 Cancer Staging   Staging form: Breast, AJCC 8th Edition - Clinical stage from 11/19/2019: Stage IB (cT2, cN0, cM0, G2, ER+, PR+, HER2-) - Signed by Pollyann Samples, NP on 11/23/2019   11/19/2019 Initial Biopsy   Diagnosis Breast, left, needle core biopsy, 12 o'clock - INVASIVE MAMMARY CARCINOMA - SEE COMMENT E-cadherin is POSITIVE supporting a ductal  origin. PROGNOSTIC INDICATORS Results: IMMUNOHISTOCHEMICAL AND MORPHOMETRIC ANALYSIS PERFORMED MANUALLY The tumor cells are NEGATIVE for Her2 (1+). Estrogen Receptor: 95%, POSITIVE, STRONG STAINING INTENSITY Progesterone Receptor: 95%, POSITIVE, STRONG STAINING INTENSITY Proliferation Marker Ki67: 10%   11/23/2019 Initial Diagnosis   Malignant neoplasm of left breast (HCC)   11/29/2019 Breast MRI   IMPRESSION: 1. 3.1 cm biopsy-proven malignancy within the UPPER LEFT breast. 2. Indeterminate 0.9 cm posterior central/LOWER LEFT breast mass. If breast conservation is desired, 2nd-look ultrasound and possible biopsy is recommended. If this mass is not identified sonographically, than MR guided biopsy would be recommended. 3. No abnormal appearing lymph nodes. 4. RIGHT mastectomy without suspicious abnormalities in the RIGHT mastectomy bed.   12/08/2019 Cancer Staging   Staging form: Breast, AJCC 8th Edition - Pathologic stage from 12/08/2019: Stage Unknown (pT2, pNX, cM0, G2, ER+, PR+, HER2-) - Signed by Malachy Mood, MD on 12/25/2019   12/08/2019 Surgery   Left Mastectomy by Dr Corliss Skains   12/08/2019 Pathology Results   FINAL MICROSCOPIC DIAGNOSIS:   A. BREAST, LEFT, MASTECTOMY:  - Invasive ductal carcinoma, multifocal, 2.8 cm in greatest dimension,  Nottingham grade 2 of 3.  - Margins of resection are not involved.  - See oncology table.  - See oncology table.    03/2020 -  Anti-estrogen oral therapy   Anastrozole 1mg  once daily starting 01/2020   03/22/2020 Survivorship   SCP delivered by [name], NP       CURRENT THERAPY: Tamoxifen 20 mg starting 04/22/22  INTERVAL HISTORY Cindy Wong presents for symptom management visit. Last seen by Dr. Mosetta Putt 05/23/22 and doing well. She developed diarrhea and high BP thought related to Tamoxifen so she has been  holding. She also has pain at the surgical site.   ROS   Past Medical History:  Diagnosis Date   Adenomatous colon polyp     tubular   Anxiety    Arthritis    Blood transfusion without reported diagnosis    Breast cancer (HCC) 2007   right mastectomy   Breast cancer (HCC) 10/2019   left breast IDC   Cataract    Depression    Diabetes mellitus    Hx of meningitis 2012   Hyperlipemia    Hypertension    Personal history of radiation therapy    Renal disorder    Stomach cancer (HCC) 1997   partial gastrectomy     Past Surgical History:  Procedure Laterality Date   ABDOMINAL HYSTERECTOMY  1974   Supracervical   BREAST BIOPSY Left 02/06/2022   [place] LT BREAST BX W LOC DEV 1ST LESION IMG BX SPEC US GUIDE 02/06/2022 [facility] MAMMOGRAPHY   BREAST CYST EXCISION Left 03/06/2022   Procedure: EXCISION OF LEFT CHEST WALL NODULE;  Surgeon: Manus Rudd, MD;  Location: [facility] OR;  Service: General;  Laterality: Left;   BREAST LUMPECTOMY Right 1993   BREAST SURGERY     Mastectomy   CHOLECYSTECTOMY     COLONOSCOPY     MASTECTOMY Right    MASTECTOMY, PARTIAL Left 12/08/2019   Procedure: LEFT MASTECTOMY;  Surgeon: Manus Rudd, MD;  Location: [facility];  Service: General;  Laterality: Left;  LMA AND PECTORAL BLOCK   meningitis     OOPHORECTOMY     One ovary removed--?side   Stomach tumor     UPPER GASTROINTESTINAL ENDOSCOPY       Outpatient Encounter Medications as of 07/04/2022  Medication Sig   amLODipine (NORVASC) 5 MG tablet Take 10 mg by mouth daily.   betamethasone dipropionate 0.05 % cream Apply 1 Application topically 2 (two) times daily as needed (psoriasis).   carvedilol (COREG) 12.5 MG tablet Take 1 tablet (12.5 mg total) by mouth 2 (two) times daily with a meal.   Cholecalciferol (D2000 ULTRA STRENGTH) 50 MCG (2000 UT) CAPS Take 2,000 Units by mouth daily.   glipiZIDE (GLUCOTROL XL) 10 MG 24 hr tablet Take 10 mg by mouth daily.   hydrochlorothiazide (HYDRODIURIL) 25 MG tablet Take 25 mg by mouth daily. In the morning   metFORMIN (GLUCOPHAGE) 500 MG tablet Take 500 mg by mouth daily with  breakfast.   mirtazapine (REMERON) 7.5 MG tablet Take 7.5 mg by mouth at bedtime.   Multiple Vitamins-Minerals (CENTRUM SILVER PO) Take 1 tablet by mouth daily.    ONETOUCH VERIO test strip 2 (two) times daily. for testing   potassium chloride SA (K-DUR,KLOR-CON) 20 MEQ tablet Take 20 mEq by mouth daily.   pravastatin (PRAVACHOL) 40 MG tablet Take 40 mg by mouth every evening.   tamoxifen (NOLVADEX) 20 MG tablet Take 1 tablet (20 mg total) by mouth daily.   traMADol (ULTRAM) 50 MG tablet Take 1 tablet (50 mg total) by mouth every 6 (six) hours as needed.   No facility-administered encounter medications on file as of 07/04/2022.     There were no vitals filed for this visit. There is no height or weight on file to calculate BMI.   PHYSICAL EXAM GENERAL:alert, no distress and comfortable SKIN: no rash  EYES: sclera clear NECK: without mass LYMPH:  no palpable cervical or supraclavicular lymphadenopathy  LUNGS: clear with normal breathing effort HEART: regular rate & rhythm, no lower extremity edema ABDOMEN: abdomen  soft, non-tender and normal bowel sounds NEURO: alert & oriented x 3 with fluent speech, no focal motor/sensory deficits Breast exam:  PAC without erythema    CBC    Component Value Date/Time   WBC 7.9 05/23/2022 1352   WBC 7.7 04/02/2022 1910   RBC 4.61 05/23/2022 1352   HGB 12.5 05/23/2022 1352   HGB 15.9 11/05/2016 1040   HCT 39.3 05/23/2022 1352   HCT 48.9 (H) 11/05/2016 1040   PLT 282 05/23/2022 1352   PLT 281 11/05/2016 1040   MCV 85.2 05/23/2022 1352   MCV 82.9 11/05/2016 1040   MCH 27.1 05/23/2022 1352   MCHC 31.8 05/23/2022 1352   RDW 12.9 05/23/2022 1352   RDW 13.8 11/05/2016 1040   LYMPHSABS 2.5 05/23/2022 1352   LYMPHSABS 2.4 11/05/2016 1040   MONOABS 0.6 05/23/2022 1352   MONOABS 0.5 11/05/2016 1040   EOSABS 0.1 05/23/2022 1352   EOSABS 0.1 11/05/2016 1040   BASOSABS 0.1 05/23/2022 1352   BASOSABS 0.1 11/05/2016 1040     CMP     Component  Value Date/Time   NA 140 05/23/2022 1352   NA 142 11/05/2016 1040   K 3.7 05/23/2022 1352   K 4.1 11/05/2016 1040   CL 105 05/23/2022 1352   CL 109 (H) 10/23/2011 1527   CO2 26 05/23/2022 1352   CO2 25 11/05/2016 1040   GLUCOSE 190 (H) 05/23/2022 1352   GLUCOSE 127 11/05/2016 1040   GLUCOSE 107 (H) 10/23/2011 1527   BUN 17 05/23/2022 1352   BUN 17.9 11/05/2016 1040   CREATININE 0.92 05/23/2022 1352   CREATININE 0.9 11/05/2016 1040   CALCIUM 9.1 05/23/2022 1352   CALCIUM 10.3 11/05/2016 1040   PROT 6.6 05/23/2022 1352   PROT 7.6 11/05/2016 1040   ALBUMIN 3.8 05/23/2022 1352   ALBUMIN 4.1 11/05/2016 1040   AST 21 05/23/2022 1352   AST 21 11/05/2016 1040   ALT 13 05/23/2022 1352   ALT 17 11/05/2016 1040   ALKPHOS 32 (L) 05/23/2022 1352   ALKPHOS 55 11/05/2016 1040   BILITOT 0.5 05/23/2022 1352   BILITOT 0.61 11/05/2016 1040   GFRNONAA 60 (L) 05/23/2022 1352   GFRAA >60 11/23/2019 1049     ASSESSMENT & PLAN:  PLAN:  No orders of the defined types were placed in this encounter.     All questions were answered. The patient knows to call the clinic with any problems, questions or concerns. No barriers to learning were detected. I spent *** counseling the patient face to face. The total time spent in the appointment was *** and more than 50% was on counseling, review of test results, and coordination of care.   [name], NP-C @DATE @

## 2022-07-04 ENCOUNTER — Inpatient Hospital Stay: Payer: Medicare Other | Attending: Nurse Practitioner | Admitting: Nurse Practitioner

## 2022-07-04 ENCOUNTER — Other Ambulatory Visit: Payer: Self-pay

## 2022-07-04 ENCOUNTER — Encounter: Payer: Self-pay | Admitting: Nurse Practitioner

## 2022-07-04 VITALS — BP 125/71 | HR 73 | Temp 97.6°F | Resp 16 | Ht 68.0 in | Wt 149.6 lb

## 2022-07-04 DIAGNOSIS — Z86000 Personal history of in-situ neoplasm of breast: Secondary | ICD-10-CM | POA: Diagnosis not present

## 2022-07-04 DIAGNOSIS — Z903 Acquired absence of stomach [part of]: Secondary | ICD-10-CM | POA: Diagnosis not present

## 2022-07-04 DIAGNOSIS — Z79810 Long term (current) use of selective estrogen receptor modulators (SERMs): Secondary | ICD-10-CM | POA: Insufficient documentation

## 2022-07-04 DIAGNOSIS — Z90710 Acquired absence of both cervix and uterus: Secondary | ICD-10-CM | POA: Insufficient documentation

## 2022-07-04 DIAGNOSIS — I10 Essential (primary) hypertension: Secondary | ICD-10-CM | POA: Diagnosis not present

## 2022-07-04 DIAGNOSIS — C50112 Malignant neoplasm of central portion of left female breast: Secondary | ICD-10-CM | POA: Diagnosis not present

## 2022-07-04 DIAGNOSIS — C50912 Malignant neoplasm of unspecified site of left female breast: Secondary | ICD-10-CM

## 2022-07-04 DIAGNOSIS — Z85028 Personal history of other malignant neoplasm of stomach: Secondary | ICD-10-CM | POA: Diagnosis not present

## 2022-07-04 DIAGNOSIS — Z9049 Acquired absence of other specified parts of digestive tract: Secondary | ICD-10-CM | POA: Insufficient documentation

## 2022-07-04 DIAGNOSIS — E785 Hyperlipidemia, unspecified: Secondary | ICD-10-CM | POA: Insufficient documentation

## 2022-07-04 DIAGNOSIS — Z170 Estrogen receptor positive status [ER+]: Secondary | ICD-10-CM | POA: Insufficient documentation

## 2022-07-04 DIAGNOSIS — Z90721 Acquired absence of ovaries, unilateral: Secondary | ICD-10-CM | POA: Diagnosis not present

## 2022-07-04 DIAGNOSIS — Z9013 Acquired absence of bilateral breasts and nipples: Secondary | ICD-10-CM | POA: Diagnosis not present

## 2022-07-04 DIAGNOSIS — N179 Acute kidney failure, unspecified: Secondary | ICD-10-CM | POA: Diagnosis not present

## 2022-07-04 DIAGNOSIS — Z923 Personal history of irradiation: Secondary | ICD-10-CM | POA: Diagnosis not present

## 2022-07-04 DIAGNOSIS — R197 Diarrhea, unspecified: Secondary | ICD-10-CM | POA: Insufficient documentation

## 2022-07-04 DIAGNOSIS — Z8509 Personal history of malignant neoplasm of other digestive organs: Secondary | ICD-10-CM | POA: Diagnosis not present

## 2022-07-04 DIAGNOSIS — Z79899 Other long term (current) drug therapy: Secondary | ICD-10-CM | POA: Insufficient documentation

## 2022-07-25 DIAGNOSIS — L821 Other seborrheic keratosis: Secondary | ICD-10-CM | POA: Diagnosis not present

## 2022-07-25 DIAGNOSIS — D225 Melanocytic nevi of trunk: Secondary | ICD-10-CM | POA: Diagnosis not present

## 2022-07-25 DIAGNOSIS — I10 Essential (primary) hypertension: Secondary | ICD-10-CM | POA: Diagnosis not present

## 2022-07-25 DIAGNOSIS — L814 Other melanin hyperpigmentation: Secondary | ICD-10-CM | POA: Diagnosis not present

## 2022-07-25 DIAGNOSIS — L910 Hypertrophic scar: Secondary | ICD-10-CM | POA: Diagnosis not present

## 2022-07-30 NOTE — Progress Notes (Unsigned)
Patient Care Team: Tisovec, Adelfa Koh, MD as PCP - Denyse Amass, Margretta Ditty, RN as Oncology Nurse Navigator Donnelly Angelica, RN as Oncology Nurse Navigator Malachy Mood, MD as Consulting Physician (Hematology) Pollyann Samples, NP as Nurse Practitioner (Nurse Practitioner) Manus Rudd, MD as Consulting Physician (General Surgery) [facility], [facility] as Consulting Physician (Nephrology)   I connected with Lanae Boast Houseworth on 08/01/22 at  9:30 AM EDT by telephone visit and verified that I am speaking with the correct person using two identifiers.   I discussed the limitations, risks, security and privacy concerns of performing an evaluation and management service by telemedicine and the availability of in-person appointments. I also discussed with the patient that there may be a patient responsible charge related to this service. The patient expressed understanding and agreed to proceed.   Other persons participating in the visit and their role in the encounter: none   Patient's location: home  Provider's location: [facility] office    CHIEF COMPLAINT: Follow up breast cancer and ?Tamoxifen side effects   Oncology History Overview Note  Cancer Staging Malignant neoplasm of central left breast [facility]) Staging form: Breast, AJCC 8th Edition - Clinical stage from 11/19/2019: Stage IB (cT2, cN0, cM0, G2, ER+, PR+, HER2-) - Signed by Pollyann Samples, NP on 11/23/2019 - Pathologic stage from 12/08/2019: Stage Unknown (pT2, pNX, cM0, G2, ER+, PR+, HER2-) - Signed by Malachy Mood, MD on 12/25/2019    Malignant neoplasm of central left breast (HCC)  11/09/2019 Breast US   IMPRESSION: 1. Highly suspicious left breast mass at the 12 o'clock position 4 cm from the nipple. Recommendation is for ultrasound-guided biopsy. 2. Indeterminate hyperechoic mass at the 12 o'clock position 4 cm from the nipple, just superior to the index lesion. This may correspond with an additional asymmetry seen  mammographically. Recommendation is for ultrasound-guided biopsy with close attention on post clip films to ensure mammographic correlation. 3. No suspicious left axillary lymphadenopathy. 4. No suspicious findings at the site of the right mastectomy bed palpable lump.   11/19/2019 Cancer Staging   Staging form: Breast, AJCC 8th Edition - Clinical stage from 11/19/2019: Stage IB (cT2, cN0, cM0, G2, ER+, PR+, HER2-) - Signed by Pollyann Samples, NP on 11/23/2019   11/19/2019 Initial Biopsy   Diagnosis Breast, left, needle core biopsy, 12 o'clock - INVASIVE MAMMARY CARCINOMA - SEE COMMENT E-cadherin is POSITIVE supporting a ductal origin. PROGNOSTIC INDICATORS Results: IMMUNOHISTOCHEMICAL AND MORPHOMETRIC ANALYSIS PERFORMED MANUALLY The tumor cells are NEGATIVE for Her2 (1+). Estrogen Receptor: 95%, POSITIVE, STRONG STAINING INTENSITY Progesterone Receptor: 95%, POSITIVE, STRONG STAINING INTENSITY Proliferation Marker Ki67: 10%   11/23/2019 Initial Diagnosis   Malignant neoplasm of left breast (HCC)   11/29/2019 Breast MRI   IMPRESSION: 1. 3.1 cm biopsy-proven malignancy within the UPPER LEFT breast. 2. Indeterminate 0.9 cm posterior central/LOWER LEFT breast mass. If breast conservation is desired, 2nd-look ultrasound and possible biopsy is recommended. If this mass is not identified sonographically, than MR guided biopsy would be recommended. 3. No abnormal appearing lymph nodes. 4. RIGHT mastectomy without suspicious abnormalities in the RIGHT mastectomy bed.   12/08/2019 Cancer Staging   Staging form: Breast, AJCC 8th Edition - Pathologic stage from 12/08/2019: Stage Unknown (pT2, pNX, cM0, G2, ER+, PR+, HER2-) - Signed by Malachy Mood, MD on 12/25/2019   12/08/2019 Surgery   Left Mastectomy by Dr Corliss Skains   12/08/2019 Pathology Results   FINAL MICROSCOPIC DIAGNOSIS:   A. BREAST, LEFT, MASTECTOMY:  -  Invasive ductal carcinoma, multifocal, 2.8 cm in greatest dimension,   Nottingham grade 2 of 3.  - Margins of resection are not involved.  - See oncology table.  - See oncology table.    03/2020 -  Anti-estrogen oral therapy   Anastrozole 1mg  once daily starting 01/2020   03/22/2020 Survivorship   SCP delivered by [name], NP       CURRENT THERAPY: S/p WLE for chest wall recurrence 02/2022 and tamoxifen beginning feb/march 2024, held since early 06/2022 for diarrhea and elevated BP  INTERVAL HISTORY Ms. Sirmons presents by phone for follow up possible Tamoxifen side effects.  She feels good, would rate herself an 8 out of 10, but something is still "not quite there yet."  She cannot put her finger on it.  Symptoms are better.  She could not tolerate 15 mg mirtazapine due to daytime sleepiness.  Pain at the surgical site resolved.  The noise in her ear resolved and blood pressure has been good, BG 124 today.  He has decreased urine flow, but no dysuria, hematuria, fever, chills.  She staying up-to-date on other routine care visits including dermatology on Monday, cardiology on Tuesday, and eye exam next week.  She remains off tamoxifen.  Denies unintentional weight loss, bone/joint pain, abdominal pain/bloating, or any other new specific complaints.  ROS  All other systems reviewed and negative  Past Medical History:  Diagnosis Date   Adenomatous colon polyp    tubular   Anxiety    Arthritis    Blood transfusion without reported diagnosis    Breast cancer (HCC) 2007   right mastectomy   Breast cancer (HCC) 10/2019   left breast IDC   Cataract    Depression    Diabetes mellitus    Hx of meningitis 2012   Hyperlipemia    Hypertension    Personal history of radiation therapy    Renal disorder    Stomach cancer (HCC) 1997   partial gastrectomy     Past Surgical History:  Procedure Laterality Date   ABDOMINAL HYSTERECTOMY  1974   Supracervical   BREAST BIOPSY Left 02/06/2022   [place] LT BREAST BX W LOC DEV 1ST LESION IMG BX SPEC US GUIDE  02/06/2022 [facility] MAMMOGRAPHY   BREAST CYST EXCISION Left 03/06/2022   Procedure: EXCISION OF LEFT CHEST WALL NODULE;  Surgeon: Manus Rudd, MD;  Location: [facility] OR;  Service: General;  Laterality: Left;   BREAST LUMPECTOMY Right 1993   BREAST SURGERY     Mastectomy   CHOLECYSTECTOMY     COLONOSCOPY     MASTECTOMY Right    MASTECTOMY, PARTIAL Left 12/08/2019   Procedure: LEFT MASTECTOMY;  Surgeon: Manus Rudd, MD;  Location: [facility];  Service: General;  Laterality: Left;  LMA AND PECTORAL BLOCK   meningitis     OOPHORECTOMY     One ovary removed--?side   Stomach tumor     UPPER GASTROINTESTINAL ENDOSCOPY       Outpatient Encounter Medications as of 08/01/2022  Medication Sig   amLODipine (NORVASC) 5 MG tablet Take 10 mg by mouth daily.   betamethasone dipropionate 0.05 % cream Apply 1 Application topically 2 (two) times daily as needed (psoriasis).   carvedilol (COREG) 12.5 MG tablet Take 1 tablet (12.5 mg total) by mouth 2 (two) times daily with a meal.   Cholecalciferol (D2000 ULTRA STRENGTH) 50 MCG (2000 UT) CAPS Take 2,000 Units by mouth daily.   glipiZIDE (GLUCOTROL XL) 10 MG 24 hr tablet  Take 10 mg by mouth daily.   hydrochlorothiazide (HYDRODIURIL) 25 MG tablet Take 25 mg by mouth daily. In the morning   metFORMIN (GLUCOPHAGE) 500 MG tablet Take 500 mg by mouth daily with breakfast.   mirtazapine (REMERON) 7.5 MG tablet Take 7.5 mg by mouth at bedtime.   Multiple Vitamins-Minerals (CENTRUM SILVER PO) Take 1 tablet by mouth daily.    ONETOUCH VERIO test strip 2 (two) times daily. for testing   potassium chloride SA (K-DUR,KLOR-CON) 20 MEQ tablet Take 20 mEq by mouth daily.   pravastatin (PRAVACHOL) 40 MG tablet Take 40 mg by mouth every evening.   tamoxifen (NOLVADEX) 20 MG tablet Take 1 tablet (20 mg total) by mouth daily.   traMADol (ULTRAM) 50 MG tablet Take 1 tablet (50 mg total) by mouth every 6 (six) hours as needed.   No facility-administered  encounter medications on file as of 08/01/2022.     There were no vitals filed for this visit. There is no height or weight on file to calculate BMI.   PHYSICAL EXAM Alert and oriented over the phone, voice is strong, speech is clear.  Mood/affect appear normal for situation.  No cough or conversational dyspnea.  LAB DATA No labs for this visit      ASSESSMENT & PLAN:Cindy Wong is a 89 y.o. female with    1.  Malignant neoplasm of the left breast, grade 2, ER 95% strongly positive, PR 95% strongly positive, HER-2 negative (1+) Ki-67 10%, cT2 N0 M0 stage Ib; recurrence left subQ chest wall 01/2022 -Diagnosed in 10/2019, s/p left mastectomy by Dr Lynann Bologna on 12/08/19. Her surgical path showed grade II IDC, 2.8 cm, with clear margins. Due to negative lymph nodes she did not require postmastectomy radiation.  -She tried anastrozole 07/07/20 - 12/2020, stopped due to SE's (caused sluggishness and difficulty with normal activities). She has been on surveillance alone -During routine surveillance visit she was found to have localized chest wall subq recurrence of previous IDC, ER/PR both 100% strongly positive, HER2 negative, Ki 67 10% confirmed on biopsy 02/06/22.  -Staging PET scan negative for distant recurrence/metastasis -She underwent wide excision of left chest wall recurrence by Dr. Corliss Skains on 03/06/22, path shows 0.7 cm IDA involving dermis of the skin, all margins negative -Started adjuvant Tamoxifen Feb/March 2024, tolerating well until ~06/2022 she developed diarrhea, weakness, lacking motivation, as well as other vague symptoms such as "shakes" and roaring in her ear. Tamoxifen was held -Today Ms. Wisz appears improved but still not to her baseline per her report, although no specific complaints. - We agreed to remain off Tamoxifen until next in person visit 7/9 as scheduled.  -she will continue routine f/ups with her other care providers.    2. History of recurrent right breast DCIS   -Diagnosed initially in 1993, s/p lumpectomy and adjuvant radiation. She reportedly tried antiestrogen therapy but did not tolerate well. -She had a recurrence of ER/PR positive DCIS in 07/2005, s/p right mastectomy on 08/24/2005   3. H/O GIST -Diagnosed in 1997, s/p partial gastrectomy, followed by Dr. Christella Hartigan  -Currently on surveillance   4.  HTN, isolated AKI -Scr normal in the past, 2.57 on 01/04/22 and confirmed by lab.  -She fell in 09/2021 and was treated for possible UTI, she did not have urinary symptoms.  UA 12/2021 negative -Renal US 01/09/22 showed no hydronephrosis or mass -She saw PCP and nephrology, some meds were adjusted. Scr returned to baseline normal in the interim -She developed left  arm pain and went to ED on 04/02/2022, found to be hypertensive at that time 204/128 after skipping antihypertensives the night prior -BP controlled and Scr normal lately   5. Bone health  -Most recent DEXA by PCP on 03/25/20. T-score was -0.7.   6. Genetics  -She has personal h/o recurrent DCIS, GIST and left breast cancer. She qualifies for genetics.  -Sister passed from cancer, unknown type, brother with metastatic prostate cancer. Does not have children, has sibling and nieces  -She was previously referred to Genetics but has not undergone testing   7. Social Support  -a sister passed away in 08/25/19 and a brother with metastatic prostate cancer -She lives alone but has brother who lives in town, socially isolated.  -God daughter passed away earlier this year    PLAN: -Remain off Tamoxifen until lab and in-person visit 7/9, will reassess then -add UA/culture to lab on 7/9    Orders Placed This Encounter  Procedures   Urine Culture    Standing Status:   Future    Standing Expiration Date:   08/01/2023   Urinalysis, Complete w Microscopic    Standing Status:   Future    Standing Expiration Date:   08/01/2023     I discussed the assessment and treatment plan with the patient. The  patient was provided an opportunity to ask questions and all were answered. The patient agreed with the plan and demonstrated an understanding of the instructions.   The patient was advised to call back or seek an in-person evaluation if the symptoms worsen or if the condition fails to improve as anticipated. The total time spent in the appointment was 10 minutes and more than 50% was on counseling, review of test results, and coordination of care.   [name], NP-C 08/01/2022

## 2022-07-31 ENCOUNTER — Ambulatory Visit: Payer: Medicare Other

## 2022-07-31 DIAGNOSIS — R0989 Other specified symptoms and signs involving the circulatory and respiratory systems: Secondary | ICD-10-CM

## 2022-08-01 ENCOUNTER — Inpatient Hospital Stay: Payer: Medicare Other | Attending: Nurse Practitioner | Admitting: Nurse Practitioner

## 2022-08-01 ENCOUNTER — Encounter: Payer: Self-pay | Admitting: Nurse Practitioner

## 2022-08-01 DIAGNOSIS — C50912 Malignant neoplasm of unspecified site of left female breast: Secondary | ICD-10-CM | POA: Diagnosis not present

## 2022-08-01 DIAGNOSIS — R6889 Other general symptoms and signs: Secondary | ICD-10-CM

## 2022-08-01 DIAGNOSIS — Z86000 Personal history of in-situ neoplasm of breast: Secondary | ICD-10-CM

## 2022-08-01 DIAGNOSIS — Z170 Estrogen receptor positive status [ER+]: Secondary | ICD-10-CM | POA: Diagnosis not present

## 2022-08-06 NOTE — Progress Notes (Signed)
Pt aware.

## 2022-08-07 DIAGNOSIS — E119 Type 2 diabetes mellitus without complications: Secondary | ICD-10-CM | POA: Diagnosis not present

## 2022-08-07 DIAGNOSIS — H40023 Open angle with borderline findings, high risk, bilateral: Secondary | ICD-10-CM | POA: Diagnosis not present

## 2022-08-26 NOTE — Progress Notes (Unsigned)
Patient Care Team: Tisovec, Adelfa Koh, MD as PCP - Denyse Amass, Margretta Ditty, RN as Oncology Nurse Navigator Donnelly Angelica, RN as Oncology Nurse Navigator Malachy Mood, MD as Consulting Physician (Hematology) Pollyann Samples, NP as Nurse Practitioner (Nurse Practitioner) Manus Rudd, MD as Consulting Physician (General Surgery) [facility], [facility] as Consulting Physician (Nephrology)   CHIEF COMPLAINT: Follow up bilateral breast cancers and left recurrence   Oncology History Overview Note  Cancer Staging Malignant neoplasm of central left breast [facility]) Staging form: Breast, AJCC 8th Edition - Clinical stage from 11/19/2019: Stage IB (cT2, cN0, cM0, G2, ER+, PR+, HER2-) - Signed by Pollyann Samples, NP on 11/23/2019 - Pathologic stage from 12/08/2019: Stage Unknown (pT2, pNX, cM0, G2, ER+, PR+, HER2-) - Signed by Malachy Mood, MD on 12/25/2019    Malignant neoplasm of central left breast (HCC)  11/09/2019 Breast US   IMPRESSION: 1. Highly suspicious left breast mass at the 12 o'clock position 4 cm from the nipple. Recommendation is for ultrasound-guided biopsy. 2. Indeterminate hyperechoic mass at the 12 o'clock position 4 cm from the nipple, just superior to the index lesion. This may correspond with an additional asymmetry seen mammographically. Recommendation is for ultrasound-guided biopsy with close attention on post clip films to ensure mammographic correlation. 3. No suspicious left axillary lymphadenopathy. 4. No suspicious findings at the site of the right mastectomy bed palpable lump.   11/19/2019 Cancer Staging   Staging form: Breast, AJCC 8th Edition - Clinical stage from 11/19/2019: Stage IB (cT2, cN0, cM0, G2, ER+, PR+, HER2-) - Signed by Pollyann Samples, NP on 11/23/2019   11/19/2019 Initial Biopsy   Diagnosis Breast, left, needle core biopsy, 12 o'clock - INVASIVE MAMMARY CARCINOMA - SEE COMMENT E-cadherin is POSITIVE supporting a ductal  origin. PROGNOSTIC INDICATORS Results: IMMUNOHISTOCHEMICAL AND MORPHOMETRIC ANALYSIS PERFORMED MANUALLY The tumor cells are NEGATIVE for Her2 (1+). Estrogen Receptor: 95%, POSITIVE, STRONG STAINING INTENSITY Progesterone Receptor: 95%, POSITIVE, STRONG STAINING INTENSITY Proliferation Marker Ki67: 10%   11/23/2019 Initial Diagnosis   Malignant neoplasm of left breast (HCC)   11/29/2019 Breast MRI   IMPRESSION: 1. 3.1 cm biopsy-proven malignancy within the UPPER LEFT breast. 2. Indeterminate 0.9 cm posterior central/LOWER LEFT breast mass. If breast conservation is desired, 2nd-look ultrasound and possible biopsy is recommended. If this mass is not identified sonographically, than MR guided biopsy would be recommended. 3. No abnormal appearing lymph nodes. 4. RIGHT mastectomy without suspicious abnormalities in the RIGHT mastectomy bed.   12/08/2019 Cancer Staging   Staging form: Breast, AJCC 8th Edition - Pathologic stage from 12/08/2019: Stage Unknown (pT2, pNX, cM0, G2, ER+, PR+, HER2-) - Signed by Malachy Mood, MD on 12/25/2019   12/08/2019 Surgery   Left Mastectomy by Dr Corliss Skains   12/08/2019 Pathology Results   FINAL MICROSCOPIC DIAGNOSIS:   A. BREAST, LEFT, MASTECTOMY:  - Invasive ductal carcinoma, multifocal, 2.8 cm in greatest dimension,  Nottingham grade 2 of 3.  - Margins of resection are not involved.  - See oncology table.  - See oncology table.    03/2020 -  Anti-estrogen oral therapy   Anastrozole 1mg  once daily starting 01/2020   03/22/2020 Survivorship   SCP delivered by [name], NP       CURRENT THERAPY: Tamoxifen 20 mg starting 04/22/22, held since 06/2022  INTERVAL HISTORY Cindy Wong returns for follow up as scheduled. Tamoxifen remains on hold.  She woke up feeling well today, has had a good few days.  She notes  it took her a long time to recover from tamoxifen and has only just not begun feeling like herself, more energy and motivation.  Has some mild hot  flashes at night.  Left breast incision still slightly tender, no new lump.    ROS  All other systems reviewed and negative  Past Medical History:  Diagnosis Date   Adenomatous colon polyp    tubular   Anxiety    Arthritis    Blood transfusion without reported diagnosis    Breast cancer (HCC) 2007   right mastectomy   Breast cancer (HCC) 10/2019   left breast IDC   Cataract    Depression    Diabetes mellitus    Hx of meningitis 2012   Hyperlipemia    Hypertension    Personal history of radiation therapy    Renal disorder    Stomach cancer (HCC) 1997   partial gastrectomy     Past Surgical History:  Procedure Laterality Date   ABDOMINAL HYSTERECTOMY  1974   Supracervical   BREAST BIOPSY Left 02/06/2022   [place] LT BREAST BX W LOC DEV 1ST LESION IMG BX SPEC US GUIDE 02/06/2022 [facility] MAMMOGRAPHY   BREAST CYST EXCISION Left 03/06/2022   Procedure: EXCISION OF LEFT CHEST WALL NODULE;  Surgeon: Manus Rudd, MD;  Location: [facility] OR;  Service: General;  Laterality: Left;   BREAST LUMPECTOMY Right 1993   BREAST SURGERY     Mastectomy   CHOLECYSTECTOMY     COLONOSCOPY     MASTECTOMY Right    MASTECTOMY, PARTIAL Left 12/08/2019   Procedure: LEFT MASTECTOMY;  Surgeon: Manus Rudd, MD;  Location: [facility];  Service: General;  Laterality: Left;  LMA AND PECTORAL BLOCK   meningitis     OOPHORECTOMY     One ovary removed--?side   Stomach tumor     UPPER GASTROINTESTINAL ENDOSCOPY       Outpatient Encounter Medications as of 08/28/2022  Medication Sig   amLODipine (NORVASC) 5 MG tablet Take 10 mg by mouth daily.   betamethasone dipropionate 0.05 % cream Apply 1 Application topically 2 (two) times daily as needed (psoriasis).   carvedilol (COREG) 12.5 MG tablet Take 1 tablet (12.5 mg total) by mouth 2 (two) times daily with a meal.   Cholecalciferol (D2000 ULTRA STRENGTH) 50 MCG (2000 UT) CAPS Take 2,000 Units by mouth daily.   glipiZIDE (GLUCOTROL XL) 10 MG 24  hr tablet Take 10 mg by mouth daily.   hydrochlorothiazide (HYDRODIURIL) 25 MG tablet Take 25 mg by mouth daily. In the morning   metFORMIN (GLUCOPHAGE) 500 MG tablet Take 500 mg by mouth daily with breakfast.   mirtazapine (REMERON) 7.5 MG tablet Take 7.5 mg by mouth at bedtime.   Multiple Vitamins-Minerals (CENTRUM SILVER PO) Take 1 tablet by mouth daily.    ONETOUCH VERIO test strip 2 (two) times daily. for testing   potassium chloride SA (K-DUR,KLOR-CON) 20 MEQ tablet Take 20 mEq by mouth daily.   pravastatin (PRAVACHOL) 40 MG tablet Take 40 mg by mouth every evening.   traMADol (ULTRAM) 50 MG tablet Take 1 tablet (50 mg total) by mouth every 6 (six) hours as needed.   [DISCONTINUED] tamoxifen (NOLVADEX) 20 MG tablet Take 1 tablet (20 mg total) by mouth daily.   No facility-administered encounter medications on file as of 08/28/2022.     Today's Vitals   08/28/22 0936 08/28/22 0947  BP: (!) 168/89 130/72  Pulse: 72   Resp: 15   Temp: 97.8  F (36.6 C)   TempSrc: Oral   SpO2: 98%   Weight: 147 lb 3.2 oz (66.8 kg)    Body mass index is 22.38 kg/m.   PHYSICAL EXAM GENERAL:alert, no distress and comfortable SKIN: no rash  EYES: sclera clear NECK: without mass LYMPH:  no palpable cervical or supraclavicular lymphadenopathy  LUNGS: clear with normal breathing effort HEART: regular rate & rhythm, no lower extremity edema ABDOMEN: abdomen soft, non-tender and normal bowel sounds NEURO: alert & oriented x 3 with fluent speech Breast exam: S/p bilateral mastectomy and left chest wall WLE, incisions completely healed.  No palpable mass or nodularity in either incision, chest wall, or axilla that I could appreciate   CBC    Component Value Date/Time   WBC 7.3 08/28/2022 0913   WBC 7.7 04/02/2022 1910   RBC 4.95 08/28/2022 0913   HGB 13.2 08/28/2022 0913   HGB 15.9 11/05/2016 1040   HCT 42.8 08/28/2022 0913   HCT 48.9 (H) 11/05/2016 1040   PLT 230 08/28/2022 0913   PLT 281  11/05/2016 1040   MCV 86.5 08/28/2022 0913   MCV 82.9 11/05/2016 1040   MCH 26.7 08/28/2022 0913   MCHC 30.8 08/28/2022 0913   RDW 13.4 08/28/2022 0913   RDW 13.8 11/05/2016 1040   LYMPHSABS 2.7 08/28/2022 0913   LYMPHSABS 2.4 11/05/2016 1040   MONOABS 0.5 08/28/2022 0913   MONOABS 0.5 11/05/2016 1040   EOSABS 0.2 08/28/2022 0913   EOSABS 0.1 11/05/2016 1040   BASOSABS 0.1 08/28/2022 0913   BASOSABS 0.1 11/05/2016 1040     CMP     Component Value Date/Time   NA 141 08/28/2022 0913   NA 142 11/05/2016 1040   K 3.8 08/28/2022 0913   K 4.1 11/05/2016 1040   CL 108 08/28/2022 0913   CL 109 (H) 10/23/2011 1527   CO2 26 08/28/2022 0913   CO2 25 11/05/2016 1040   GLUCOSE 148 (H) 08/28/2022 0913   GLUCOSE 127 11/05/2016 1040   GLUCOSE 107 (H) 10/23/2011 1527   BUN 17 08/28/2022 0913   BUN 17.9 11/05/2016 1040   CREATININE 0.92 08/28/2022 0913   CREATININE 0.9 11/05/2016 1040   CALCIUM 9.5 08/28/2022 0913   CALCIUM 10.3 11/05/2016 1040   PROT 6.5 08/28/2022 0913   PROT 7.6 11/05/2016 1040   ALBUMIN 3.7 08/28/2022 0913   ALBUMIN 4.1 11/05/2016 1040   AST 17 08/28/2022 0913   AST 21 11/05/2016 1040   ALT 9 08/28/2022 0913   ALT 17 11/05/2016 1040   ALKPHOS 39 08/28/2022 0913   ALKPHOS 55 11/05/2016 1040   BILITOT 0.5 08/28/2022 0913   BILITOT 0.61 11/05/2016 1040   GFRNONAA 60 (L) 08/28/2022 0913   GFRAA >60 11/23/2019 1049     ASSESSMENT & PLAN:Cindy Wong is a 89 y.o. female with    1.  Malignant neoplasm of the left breast, grade 2, ER 95% strongly positive, PR 95% strongly positive, HER-2 negative (1+) Ki-67 10%, cT2 N0 M0 stage Ib; recurrence left subQ chest wall 01/2022 -Diagnosed in 10/2019, s/p left mastectomy by Dr Lynann Bologna on 12/08/19. Her surgical path showed grade II IDC, 2.8 cm, with clear margins. Due to negative lymph nodes she did not require postmastectomy radiation.  -She tried anastrozole 07/07/20 - 12/2020, stopped due to SE's (caused sluggishness  and difficulty with normal activities). She has been on surveillance alone -During routine surveillance visit she was found to have localized chest wall subq recurrence of previous IDC, ER/PR both  100% strongly positive, HER2 negative, Ki 67 10% confirmed on biopsy 02/06/22.  -Staging PET scan negative for distant recurrence/metastasis -She underwent wide excision of left chest wall recurrence by Dr. Corliss Skains on 03/06/22, path shows 0.7 cm IDA involving dermis of the skin, all margins negative -Started adjuvant Tamoxifen Feb/March 2024, tolerating well until ~06/2022 she developed diarrhea, weakness, lacking motivation, as well as other vague symptoms such as "shakes" and roaring in her ear. Tamoxifen was held -08/01/22 virtual visit: she appeared improved but still not to her baseline per her report, although no specific complaints. Tamoxifen remained on hold -Cindy Wong is clinically doing well.  It took her 2 months to recover from Tamoxifen SE's, motivation and energy are back to baseline.   -We discussed further antiestrogen, she did not tolerate AI's in the past.  We discussed low dose tamoxifen 10 mg versus remaining off antiestrogen altogether.  After lengthy discussion she prefers to d/c tamoxifen -Labs reviewed, CBC normal, CMP stable.  -Continue surveillance   2. History of recurrent right breast DCIS  -Diagnosed initially in 1991-09-28, s/p lumpectomy and adjuvant radiation. She reportedly tried antiestrogen therapy but did not tolerate well. -She had a recurrence of ER/PR positive DCIS in 07/2005, s/p right mastectomy on 08/24/2005   3. H/O GIST -Diagnosed in 1995/09/28, s/p partial gastrectomy, followed by Dr. Christella Hartigan  -Currently on surveillance without clinical concern for recurrence   4. Bone health  -Most recent DEXA by PCP on 03/25/20. T-score was -0.7.   5. Genetics  -She has personal h/o recurrent DCIS, GIST and left breast cancer. She qualifies for genetics.  -Sister passed from cancer, unknown  type, brother with metastatic prostate cancer. Does not have children, has sibling and nieces  -She was previously referred to Genetics but has not undergone testing   6. Social Support  -a sister passed away in 09-28-2019 and a brother with metastatic prostate cancer -She lives alone but has brother who lives in town, socially isolated.  -God daughter passed away earlier this year     PLAN: -Labs reviewed -D/c Tamoxifen due to SE's -Continue breast cancer surveillance -F/up in 6 months, or sooner if needed   All questions were answered. The patient knows to call the clinic with any problems, questions or concerns. No barriers to learning were detected. I spent 20 minutes counseling the patient face to face. The total time spent in the appointment was 30 minutes and more than 50% was on counseling, review of test results, and coordination of care.   [name], NP-C 08/28/2022

## 2022-08-28 ENCOUNTER — Other Ambulatory Visit: Payer: Self-pay

## 2022-08-28 ENCOUNTER — Inpatient Hospital Stay: Payer: Medicare Other | Admitting: Nurse Practitioner

## 2022-08-28 ENCOUNTER — Encounter: Payer: Self-pay | Admitting: Nurse Practitioner

## 2022-08-28 ENCOUNTER — Inpatient Hospital Stay: Payer: Medicare Other | Attending: Nurse Practitioner

## 2022-08-28 VITALS — BP 130/72 | HR 72 | Temp 97.8°F | Resp 15 | Wt 147.2 lb

## 2022-08-28 DIAGNOSIS — Z90710 Acquired absence of both cervix and uterus: Secondary | ICD-10-CM | POA: Diagnosis not present

## 2022-08-28 DIAGNOSIS — I10 Essential (primary) hypertension: Secondary | ICD-10-CM | POA: Insufficient documentation

## 2022-08-28 DIAGNOSIS — Z170 Estrogen receptor positive status [ER+]: Secondary | ICD-10-CM | POA: Insufficient documentation

## 2022-08-28 DIAGNOSIS — Z853 Personal history of malignant neoplasm of breast: Secondary | ICD-10-CM | POA: Insufficient documentation

## 2022-08-28 DIAGNOSIS — Z9049 Acquired absence of other specified parts of digestive tract: Secondary | ICD-10-CM | POA: Diagnosis not present

## 2022-08-28 DIAGNOSIS — C50912 Malignant neoplasm of unspecified site of left female breast: Secondary | ICD-10-CM | POA: Diagnosis not present

## 2022-08-28 DIAGNOSIS — Z85028 Personal history of other malignant neoplasm of stomach: Secondary | ICD-10-CM | POA: Diagnosis not present

## 2022-08-28 DIAGNOSIS — N6489 Other specified disorders of breast: Secondary | ICD-10-CM | POA: Diagnosis not present

## 2022-08-28 DIAGNOSIS — Z903 Acquired absence of stomach [part of]: Secondary | ICD-10-CM | POA: Diagnosis not present

## 2022-08-28 DIAGNOSIS — Z90721 Acquired absence of ovaries, unilateral: Secondary | ICD-10-CM | POA: Diagnosis not present

## 2022-08-28 DIAGNOSIS — Z79899 Other long term (current) drug therapy: Secondary | ICD-10-CM | POA: Insufficient documentation

## 2022-08-28 DIAGNOSIS — Z86000 Personal history of in-situ neoplasm of breast: Secondary | ICD-10-CM | POA: Diagnosis not present

## 2022-08-28 DIAGNOSIS — Z8509 Personal history of malignant neoplasm of other digestive organs: Secondary | ICD-10-CM

## 2022-08-28 DIAGNOSIS — C50812 Malignant neoplasm of overlapping sites of left female breast: Secondary | ICD-10-CM | POA: Diagnosis not present

## 2022-08-28 DIAGNOSIS — Z9013 Acquired absence of bilateral breasts and nipples: Secondary | ICD-10-CM | POA: Insufficient documentation

## 2022-08-28 DIAGNOSIS — Z8042 Family history of malignant neoplasm of prostate: Secondary | ICD-10-CM | POA: Insufficient documentation

## 2022-08-28 DIAGNOSIS — E785 Hyperlipidemia, unspecified: Secondary | ICD-10-CM | POA: Diagnosis not present

## 2022-08-28 DIAGNOSIS — Z923 Personal history of irradiation: Secondary | ICD-10-CM | POA: Insufficient documentation

## 2022-08-28 DIAGNOSIS — R6889 Other general symptoms and signs: Secondary | ICD-10-CM

## 2022-08-28 LAB — URINALYSIS, COMPLETE (UACMP) WITH MICROSCOPIC
Bilirubin Urine: NEGATIVE
Glucose, UA: NEGATIVE mg/dL
Hgb urine dipstick: NEGATIVE
Ketones, ur: NEGATIVE mg/dL
Nitrite: NEGATIVE
Protein, ur: NEGATIVE mg/dL
Specific Gravity, Urine: 1.008 (ref 1.005–1.030)
pH: 5 (ref 5.0–8.0)

## 2022-08-28 LAB — CMP (CANCER CENTER ONLY)
ALT: 9 U/L (ref 0–44)
AST: 17 U/L (ref 15–41)
Albumin: 3.7 g/dL (ref 3.5–5.0)
Alkaline Phosphatase: 39 U/L (ref 38–126)
Anion gap: 7 (ref 5–15)
BUN: 17 mg/dL (ref 8–23)
CO2: 26 mmol/L (ref 22–32)
Calcium: 9.5 mg/dL (ref 8.9–10.3)
Chloride: 108 mmol/L (ref 98–111)
Creatinine: 0.92 mg/dL (ref 0.44–1.00)
GFR, Estimated: 60 mL/min — ABNORMAL LOW (ref >60)
Glucose, Bld: 148 mg/dL — ABNORMAL HIGH (ref 70–99)
Potassium: 3.8 mmol/L (ref 3.5–5.1)
Sodium: 141 mmol/L (ref 135–145)
Total Bilirubin: 0.5 mg/dL (ref 0.3–1.2)
Total Protein: 6.5 g/dL (ref 6.5–8.1)

## 2022-08-28 LAB — CBC WITH DIFFERENTIAL (CANCER CENTER ONLY)
Abs Immature Granulocytes: 0.04 10*3/uL (ref 0.00–0.07)
Basophils Absolute: 0.1 10*3/uL (ref 0.0–0.1)
Basophils Relative: 1 %
Eosinophils Absolute: 0.2 10*3/uL (ref 0.0–0.5)
Eosinophils Relative: 2 %
HCT: 42.8 % (ref 36.0–46.0)
Hemoglobin: 13.2 g/dL (ref 12.0–15.0)
Immature Granulocytes: 1 %
Lymphocytes Relative: 37 %
Lymphs Abs: 2.7 10*3/uL (ref 0.7–4.0)
MCH: 26.7 pg (ref 26.0–34.0)
MCHC: 30.8 g/dL (ref 30.0–36.0)
MCV: 86.5 fL (ref 80.0–100.0)
Monocytes Absolute: 0.5 10*3/uL (ref 0.1–1.0)
Monocytes Relative: 7 %
Neutro Abs: 3.9 10*3/uL (ref 1.7–7.7)
Neutrophils Relative %: 52 %
Platelet Count: 230 10*3/uL (ref 150–400)
RBC: 4.95 MIL/uL (ref 3.87–5.11)
RDW: 13.4 % (ref 11.5–15.5)
WBC Count: 7.3 10*3/uL (ref 4.0–10.5)
nRBC: 0 % (ref 0.0–0.2)

## 2022-08-29 ENCOUNTER — Telehealth: Payer: Self-pay

## 2022-08-29 LAB — URINE CULTURE

## 2022-08-29 NOTE — Telephone Encounter (Addendum)
Called patient to relay message below as per [name] NP. Patient voiced full understanding.    ----- Message from Pollyann Samples, NP sent at 08/29/2022 12:32 PM EDT ----- Please let pt know UA was likely contaminated, no infection. If she is having urinary symptoms, she can repeat or call PCP.   Thanks, Clayborn Heron NP

## 2022-09-20 DIAGNOSIS — C50912 Malignant neoplasm of unspecified site of left female breast: Secondary | ICD-10-CM | POA: Diagnosis not present

## 2022-10-04 DIAGNOSIS — R52 Pain, unspecified: Secondary | ICD-10-CM | POA: Diagnosis not present

## 2022-10-04 DIAGNOSIS — Z1152 Encounter for screening for COVID-19: Secondary | ICD-10-CM | POA: Diagnosis not present

## 2022-10-04 DIAGNOSIS — I10 Essential (primary) hypertension: Secondary | ICD-10-CM | POA: Diagnosis not present

## 2022-10-04 DIAGNOSIS — U071 COVID-19: Secondary | ICD-10-CM | POA: Diagnosis not present

## 2022-10-04 DIAGNOSIS — R051 Acute cough: Secondary | ICD-10-CM | POA: Diagnosis not present

## 2022-10-04 DIAGNOSIS — R5383 Other fatigue: Secondary | ICD-10-CM | POA: Diagnosis not present

## 2022-10-04 DIAGNOSIS — E7800 Pure hypercholesterolemia, unspecified: Secondary | ICD-10-CM | POA: Diagnosis not present

## 2022-10-05 ENCOUNTER — Ambulatory Visit: Payer: Medicare Other | Admitting: Cardiology

## 2022-10-09 ENCOUNTER — Ambulatory Visit: Payer: Medicare Other | Admitting: Podiatry

## 2022-10-18 ENCOUNTER — Ambulatory Visit: Payer: Medicare Other | Admitting: Podiatry

## 2022-10-24 DIAGNOSIS — I10 Essential (primary) hypertension: Secondary | ICD-10-CM | POA: Diagnosis not present

## 2022-10-24 DIAGNOSIS — E785 Hyperlipidemia, unspecified: Secondary | ICD-10-CM | POA: Diagnosis not present

## 2022-10-24 DIAGNOSIS — E119 Type 2 diabetes mellitus without complications: Secondary | ICD-10-CM | POA: Diagnosis not present

## 2022-10-24 DIAGNOSIS — E7800 Pure hypercholesterolemia, unspecified: Secondary | ICD-10-CM | POA: Diagnosis not present

## 2022-10-24 DIAGNOSIS — E559 Vitamin D deficiency, unspecified: Secondary | ICD-10-CM | POA: Diagnosis not present

## 2022-10-31 DIAGNOSIS — Z23 Encounter for immunization: Secondary | ICD-10-CM | POA: Diagnosis not present

## 2022-10-31 DIAGNOSIS — Z1339 Encounter for screening examination for other mental health and behavioral disorders: Secondary | ICD-10-CM | POA: Diagnosis not present

## 2022-10-31 DIAGNOSIS — Z0000 Encounter for general adult medical examination without abnormal findings: Secondary | ICD-10-CM | POA: Diagnosis not present

## 2022-10-31 DIAGNOSIS — Z9010 Acquired absence of unspecified breast and nipple: Secondary | ICD-10-CM | POA: Diagnosis not present

## 2022-10-31 DIAGNOSIS — I10 Essential (primary) hypertension: Secondary | ICD-10-CM | POA: Diagnosis not present

## 2022-10-31 DIAGNOSIS — C50919 Malignant neoplasm of unspecified site of unspecified female breast: Secondary | ICD-10-CM | POA: Diagnosis not present

## 2022-10-31 DIAGNOSIS — R82998 Other abnormal findings in urine: Secondary | ICD-10-CM | POA: Diagnosis not present

## 2022-10-31 DIAGNOSIS — F331 Major depressive disorder, recurrent, moderate: Secondary | ICD-10-CM | POA: Diagnosis not present

## 2022-10-31 DIAGNOSIS — K222 Esophageal obstruction: Secondary | ICD-10-CM | POA: Diagnosis not present

## 2022-10-31 DIAGNOSIS — D692 Other nonthrombocytopenic purpura: Secondary | ICD-10-CM | POA: Diagnosis not present

## 2022-10-31 DIAGNOSIS — E119 Type 2 diabetes mellitus without complications: Secondary | ICD-10-CM | POA: Diagnosis not present

## 2022-10-31 DIAGNOSIS — F411 Generalized anxiety disorder: Secondary | ICD-10-CM | POA: Diagnosis not present

## 2022-10-31 DIAGNOSIS — Z1331 Encounter for screening for depression: Secondary | ICD-10-CM | POA: Diagnosis not present

## 2022-11-12 ENCOUNTER — Ambulatory Visit: Payer: Medicare Other | Admitting: Cardiology

## 2022-11-27 DIAGNOSIS — H401131 Primary open-angle glaucoma, bilateral, mild stage: Secondary | ICD-10-CM | POA: Diagnosis not present

## 2022-12-17 ENCOUNTER — Ambulatory Visit (INDEPENDENT_AMBULATORY_CARE_PROVIDER_SITE_OTHER): Payer: Medicare Other | Admitting: Podiatry

## 2022-12-17 ENCOUNTER — Encounter: Payer: Self-pay | Admitting: Podiatry

## 2022-12-17 DIAGNOSIS — L84 Corns and callosities: Secondary | ICD-10-CM | POA: Diagnosis not present

## 2022-12-17 DIAGNOSIS — B351 Tinea unguium: Secondary | ICD-10-CM

## 2022-12-17 DIAGNOSIS — E1151 Type 2 diabetes mellitus with diabetic peripheral angiopathy without gangrene: Secondary | ICD-10-CM | POA: Diagnosis not present

## 2022-12-17 DIAGNOSIS — M79676 Pain in unspecified toe(s): Secondary | ICD-10-CM

## 2022-12-17 DIAGNOSIS — Q828 Other specified congenital malformations of skin: Secondary | ICD-10-CM

## 2022-12-17 NOTE — Progress Notes (Signed)
This patient returns to my office for at risk foot care.  This patient requires this care by a professional since this patient will be at risk due to having diabetes.   This patient is unable to cut nails herself since the patient cannot reach her nails.  These nails is painful walking and wearing her shoes.  She also has pain due to her callus.. This patient presents for at risk foot care today.  General Appearance  Alert, conversant and in no acute stress.  Vascular  Dorsalis pedis  pulses are palpable  Bilaterally. Posterior tibial pulses are absent  B/L.  Capillary return is within normal limits  bilaterally. Temperature is within normal limits  Bilaterally  .Absent digital hair.  Neurologic  Senn-Weinstein monofilament wire test within normal limits  bilaterally. Muscle power within normal limits bilaterally.  Nails Thick disfigured discolored nails with subungual debris  from hallux to fifth toes bilaterally. No evidence of bacterial infection or drainage bilaterally.  Orthopedic  No limitations of motion  feet .  No crepitus or effusions noted.  No bony pathology or digital deformities noted.  midfoot arthritis  B/L.  Skin  normotropic skin with no porokeratosis noted bilaterally.  No signs of infections or ulcers noted.   symptomatic  HD 5th toe left.  Porokeratosis  5th  MPJ  B/L.  Onychomycosis  Pain in right toes  Pain in left toes  .  Porokeratosis sub 5th  B/L.  Consent was obtained for treatment procedures.   Mechanical debridement of nails 1-5  bilaterally performed with a nail nipper.  Filed with dremel without incident. Debride porokeratosis with dremel tool.   Return office visit    3 months                  Told patient to return for periodic foot care and evaluation due to potential at risk complications.   Helane Gunther DPM

## 2023-01-08 DIAGNOSIS — H401131 Primary open-angle glaucoma, bilateral, mild stage: Secondary | ICD-10-CM | POA: Diagnosis not present

## 2023-01-08 DIAGNOSIS — Z961 Presence of intraocular lens: Secondary | ICD-10-CM | POA: Diagnosis not present

## 2023-01-15 DIAGNOSIS — Z23 Encounter for immunization: Secondary | ICD-10-CM | POA: Diagnosis not present

## 2023-01-28 DIAGNOSIS — I10 Essential (primary) hypertension: Secondary | ICD-10-CM | POA: Diagnosis not present

## 2023-01-28 DIAGNOSIS — E119 Type 2 diabetes mellitus without complications: Secondary | ICD-10-CM | POA: Diagnosis not present

## 2023-01-28 DIAGNOSIS — Z853 Personal history of malignant neoplasm of breast: Secondary | ICD-10-CM | POA: Diagnosis not present

## 2023-02-06 DIAGNOSIS — Z853 Personal history of malignant neoplasm of breast: Secondary | ICD-10-CM | POA: Diagnosis not present

## 2023-02-06 DIAGNOSIS — E119 Type 2 diabetes mellitus without complications: Secondary | ICD-10-CM | POA: Diagnosis not present

## 2023-02-06 DIAGNOSIS — F331 Major depressive disorder, recurrent, moderate: Secondary | ICD-10-CM | POA: Diagnosis not present

## 2023-02-06 DIAGNOSIS — I10 Essential (primary) hypertension: Secondary | ICD-10-CM | POA: Diagnosis not present

## 2023-02-06 DIAGNOSIS — F411 Generalized anxiety disorder: Secondary | ICD-10-CM | POA: Diagnosis not present

## 2023-02-25 NOTE — Progress Notes (Signed)
 Patient Care Team: Tisovec, Charlie ORN, MD as PCP - Diedre Rigg, Stephane BROCKS, RN as Oncology Nurse Navigator Tyree Nanetta SAILOR, RN as Oncology Nurse Navigator Lanny Callander, MD as Consulting Physician (Hematology) Burton, Cindy K, NP as Nurse Practitioner (Nurse Practitioner) Belinda Cough, MD as Consulting Physician (General Surgery) [facility], [facility] as Consulting Physician (Nephrology)  Clinic Day:  02/27/2023  Referring physician: Tisovec, Cindy W, MD  ASSESSMENT & PLAN:   Assessment & Plan: Malignant neoplasm of central left breast (HCC)  grade 2, ER 95% strongly positive, PR 95% strongly positive, HER-2 negative (1+) Ki-67 10%, cT2 N0 M0 stage Ib; recurrence left subQ chest wall 01/2022 -Diagnosed in 10/2019, s/p left mastectomy by Dr Alphonsa on 12/08/19. Her surgical path showed grade II IDC, 2.8 cm, with clear margins. Due to negative lymph nodes she did not require postmastectomy radiation.  -She tried anastrozole  07/07/20 - 12/2020, stopped due to SE's (caused sluggishness and difficulty with normal activities). She has been on surveillance alone -During routine surveillance visit she was found to have localized chest wall subq recurrence of previous IDC, ER/PR both 100% strongly positive, HER2 negative, Ki 67 10% confirmed on biopsy 02/06/22.  -Staging PET scan negative for distant recurrence/metastasis -She underwent wide excision of left chest wall recurrence by Dr. Belinda on 03/06/22, path shows 0.7 cm IDA involving dermis of the skin, all margins negative -Started adjuvant Tamoxifen  Feb/March 2024, tolerating well until ~06/2022 she developed diarrhea, weakness, lacking motivation, as well as other vague symptoms such as shakes and roaring in her ear. Tamoxifen  was held -08/01/22 virtual visit: she appeared improved but still not to her baseline per her report, although no specific complaints. Tamoxifen  remained on hold -Ms. Cindy Wong is clinically doing well.  It took her 2  months to recover from Tamoxifen  SE's, motivation and energy are back to baseline.   -We discussed further antiestrogen, she did not tolerate AI's in the past.  We discussed low dose tamoxifen  10 mg versus remaining off antiestrogen altogether.  After lengthy discussion she prefers to d/c tamoxifen  -Labs reviewed, CBC normal, CMP stable.  -Continue surveillance   Plan: Labs reviewed  -CBC showing WBC 6.5; Hgb 14.1; Hct 44.0; Plt 290; Anc 3.9 -CMP - Wong 3.7; glucose 227; BUN 22; Creatinine 0.99; eGFR 58; Ca 9.6; LFTs normal.   Increase mirtazapine  to 15 mg every evening. Will plan to have phone visit to reassess in 6 weeks.  Patient does have follow up with her surgeon Dr. Belinda in March 2025. Repeat labs and follow up in 6 months  The patient understands the plans discussed today and is in agreement with them.  She knows to contact our office if she develops concerns prior to her next appointment.  I provided 25 minutes of face-to-face time during this encounter and > 50% was spent counseling as documented under my assessment and plan.    Powell FORBES Lessen, NP  [facility] - A DEPT OF [facility] [address] [place] [address] Dept: [phone] Dept Fax: [phone]   No orders of the defined types were placed in this encounter.     CHIEF COMPLAINT:  CC: Bilateral breast cancers with left breast cancer recurrence, estrogen receptor positive  Current Treatment: surveillance. She has been unable to tolerate estrogen blockers at all.   INTERVAL HISTORY:  Cindy Wong is here today for repeat clinical assessment.  She was last seen by Lacie, NP on  08/28/2022.  She had to stop tamoxifen  due to side effects which included fatigue and joint pain.  Took her a long time to recover.  She reports feeling increased anxiety with decreased appetite and some difficulty sleeping. She states that there are some nights, she wakes up  with a panic attack type sensation. She denies chest pain, chest pressure, or shortness of breath. she denies headaches or visual disturbances. She denies abdominal pain, nausea, vomiting, or changes in bowel or bladder habits.  She denies fevers or chills. She denies pain. Her appetite is fair. Her weight has been stable.  I have reviewed the past medical history, past surgical history, social history and family history with the patient and they are unchanged from previous note.  ALLERGIES:  is allergic to sulfamethoxazole and sulfonamide derivatives.  MEDICATIONS:  Current Outpatient Medications  Medication Sig Dispense Refill   amLODipine  (NORVASC ) 5 MG tablet Take 10 mg by mouth daily.     betamethasone dipropionate 0.05 % cream Apply 1 Application topically 2 (two) times daily as needed (psoriasis).     carvedilol  (COREG ) 12.5 MG tablet Take 1 tablet (12.5 mg total) by mouth 2 (two) times daily with a meal. 180 tablet 3   Cholecalciferol (D2000 ULTRA STRENGTH) 50 MCG (2000 UT) CAPS Take 2,000 Units by mouth daily.     glipiZIDE  (GLUCOTROL  XL) 10 MG 24 hr tablet Take 10 mg by mouth daily.     hydrochlorothiazide  (HYDRODIURIL ) 25 MG tablet Take 25 mg by mouth daily. In the morning  10   latanoprost (XALATAN) 0.005 % ophthalmic solution Place 1 drop into both eyes at bedtime.     metFORMIN  (GLUCOPHAGE ) 500 MG tablet Take 500 mg by mouth daily with breakfast.     Multiple Vitamins-Minerals (CENTRUM SILVER PO) Take 1 tablet by mouth daily.      ONETOUCH VERIO test strip 2 (two) times daily. for testing  2   potassium chloride  SA (Wong-DUR,KLOR-CON ) 20 MEQ tablet Take 20 mEq by mouth daily.     pravastatin  (PRAVACHOL ) 40 MG tablet Take 40 mg by mouth every evening.     traMADol  (ULTRAM ) 50 MG tablet Take 1 tablet (50 mg total) by mouth every 6 (six) hours as needed. 20 tablet 0   mirtazapine  (REMERON ) 15 MG tablet Take 1 tablet (15 mg total) by mouth at bedtime. 30 tablet 1   No current  facility-administered medications for this visit.    HISTORY OF PRESENT ILLNESS:   Oncology History Overview Note  Cancer Staging Malignant neoplasm of central left breast [facility]) Staging form: Breast, AJCC 8th Edition - Clinical stage from 11/19/2019: Stage IB (cT2, cN0, cM0, G2, ER+, PR+, HER2-) - Signed by Burton, Cindy K, NP on 11/23/2019 - Pathologic stage from 12/08/2019: Stage Unknown (pT2, pNX, cM0, G2, ER+, PR+, HER2-) - Signed by Lanny Callander, MD on 12/25/2019    Malignant neoplasm of central left breast (HCC)  11/09/2019 Breast US    IMPRESSION: 1. Highly suspicious left breast mass at the 12 o'clock position 4 cm from the nipple. Recommendation is for ultrasound-guided biopsy. 2. Indeterminate hyperechoic mass at the 12 o'clock position 4 cm from the nipple, just superior to the index lesion. This may correspond with an additional asymmetry seen mammographically. Recommendation is for ultrasound-guided biopsy with close attention on post clip films to ensure mammographic correlation. 3. No suspicious left axillary lymphadenopathy. 4. No suspicious findings at the site of the right mastectomy bed palpable lump.   11/19/2019 Cancer Staging  Staging form: Breast, AJCC 8th Edition - Clinical stage from 11/19/2019: Stage IB (cT2, cN0, cM0, G2, ER+, PR+, HER2-) - Signed by Burton, Cindy K, NP on 11/23/2019   11/19/2019 Initial Biopsy   Diagnosis Breast, left, needle core biopsy, 12 o'clock - INVASIVE MAMMARY CARCINOMA - SEE COMMENT E-cadherin is POSITIVE supporting a ductal origin. PROGNOSTIC INDICATORS Results: IMMUNOHISTOCHEMICAL AND MORPHOMETRIC ANALYSIS PERFORMED MANUALLY The tumor cells are NEGATIVE for Her2 (1+). Estrogen Receptor: 95%, POSITIVE, STRONG STAINING INTENSITY Progesterone Receptor: 95%, POSITIVE, STRONG STAINING INTENSITY Proliferation Marker Ki67: 10%   11/23/2019 Initial Diagnosis   Malignant neoplasm of left breast (HCC)   11/29/2019 Breast MRI    IMPRESSION: 1. 3.1 cm biopsy-proven malignancy within the UPPER LEFT breast. 2. Indeterminate 0.9 cm posterior central/LOWER LEFT breast mass. If breast conservation is desired, 2nd-look ultrasound and possible biopsy is recommended. If this mass is not identified sonographically, than MR guided biopsy would be recommended. 3. No abnormal appearing lymph nodes. 4. RIGHT mastectomy without suspicious abnormalities in the RIGHT mastectomy bed.   12/08/2019 Cancer Staging   Staging form: Breast, AJCC 8th Edition - Pathologic stage from 12/08/2019: Stage Unknown (pT2, pNX, cM0, G2, ER+, PR+, HER2-) - Signed by Lanny Callander, MD on 12/25/2019   12/08/2019 Surgery   Left Mastectomy by Dr Belinda   12/08/2019 Pathology Results   FINAL MICROSCOPIC DIAGNOSIS:   A. BREAST, LEFT, MASTECTOMY:  - Invasive ductal carcinoma, multifocal, 2.8 cm in greatest dimension,  Nottingham grade 2 of 3.  - Margins of resection are not involved.  - See oncology table.  - See oncology table.    03/2020 -  Anti-estrogen oral therapy   Anastrozole  1mg  once daily starting 01/2020   03/22/2020 Survivorship   SCP delivered by Cindy Burton, NP        REVIEW OF SYSTEMS:   Constitutional: Denies fevers, chills or abnormal weight loss. Decreased appetite Eyes: Denies blurriness of vision Ears, nose, mouth, throat, and face: Denies mucositis or sore throat Respiratory: Denies cough, dyspnea or wheezes Cardiovascular: Denies palpitation, chest discomfort or lower extremity swelling Gastrointestinal:  Denies nausea, heartburn or change in bowel habits Skin: Denies abnormal skin rashes Lymphatics: Denies new lymphadenopathy or easy bruising Neurological:Denies numbness, tingling or new weaknesses Behavioral/Psych: Mood is stable, no new changes. Problems with sleep and increased worry.  All other systems were reviewed with the patient and are negative.   VITALS:   Today's Vitals   02/27/23 0916 02/27/23 0917  02/27/23 0924  BP: (!) 153/73 (!) 127/50   Pulse: 64 64   Resp: 16    Temp: 97.9 F (36.6 C)    TempSrc: Temporal    SpO2: 99%    Weight: 147 lb 3.2 oz (66.8 kg)    PainSc:   0-No pain   Body mass index is 22.38 kg/m.   Wt Readings from Last 3 Encounters:  02/27/23 147 lb 3.2 oz (66.8 kg)  08/28/22 147 lb 3.2 oz (66.8 kg)  07/04/22 149 lb 9.6 oz (67.9 kg)    Body mass index is 22.38 kg/m.  Performance status (ECOG): 1 - Symptomatic but completely ambulatory  PHYSICAL EXAM:   GENERAL:alert, no distress and comfortable SKIN: skin color, texture, turgor are normal, no rashes or significant lesions EYES: normal, Conjunctiva are pink and non-injected, sclera clear OROPHARYNX:no exudate, no erythema and lips, buccal mucosa, and tongue normal  NECK: supple, thyroid  normal size, non-tender, without nodularity LYMPH:  no palpable lymphadenopathy in the cervical, axillary or inguinal LUNGS: clear to  auscultation and percussion with normal breathing effort HEART: regular rate & rhythm and no murmurs and no lower extremity edema ABDOMEN:abdomen soft, non-tender and normal bowel sounds Musculoskeletal:no cyanosis of digits and no clubbing  NEURO: alert & oriented x 3 with fluent speech, no focal motor/sensory deficits BREAST:  S/p bilateral mastectomy and left chest wall WLE, incisions completely healed.  Some tenderness with palpation of left chest incision. No tenderness of right chest surgical scar. No lumps or masses are appreciated today. There is no axillary lymphadenopathy bacterially.    LABORATORY DATA:  I have reviewed the data as listed    Component Value Date/Time   NA 140 02/27/2023 0852   NA 142 11/05/2016 1040   Wong 3.7 02/27/2023 0852   Wong 4.1 11/05/2016 1040   CL 104 02/27/2023 0852   CL 109 (H) 10/23/2011 1527   CO2 29 02/27/2023 0852   CO2 25 11/05/2016 1040   GLUCOSE 227 (H) 02/27/2023 0852   GLUCOSE 127 11/05/2016 1040   GLUCOSE 107 (H) 10/23/2011 1527    BUN 22 02/27/2023 0852   BUN 17.9 11/05/2016 1040   CREATININE 0.94 02/27/2023 0852   CREATININE 0.9 11/05/2016 1040   CALCIUM 9.6 02/27/2023 0852   CALCIUM 10.3 11/05/2016 1040   PROT 6.7 02/27/2023 0852   PROT 7.6 11/05/2016 1040   ALBUMIN 4.0 02/27/2023 0852   ALBUMIN 4.1 11/05/2016 1040   AST 13 (L) 02/27/2023 0852   AST 21 11/05/2016 1040   ALT 9 02/27/2023 0852   ALT 17 11/05/2016 1040   ALKPHOS 52 02/27/2023 0852   ALKPHOS 55 11/05/2016 1040   BILITOT 0.5 02/27/2023 0852   BILITOT 0.61 11/05/2016 1040   GFRNONAA 58 (L) 02/27/2023 0852   GFRAA >60 11/23/2019 1049     Lab Results  Component Value Date   WBC 6.5 02/27/2023   NEUTROABS 3.9 02/27/2023   HGB 14.1 02/27/2023   HCT 44.0 02/27/2023   MCV 83.5 02/27/2023   PLT 290 02/27/2023

## 2023-02-25 NOTE — Assessment & Plan Note (Signed)
 grade 2, ER 95% strongly positive, PR 95% strongly positive, HER-2 negative (1+) Ki-67 10%, cT2 N0 M0 stage Ib; recurrence left subQ chest wall 01/2022 -Diagnosed in 10/2019, s/p left mastectomy by Dr Lynann Bologna on 12/08/19. Her surgical path showed grade II IDC, 2.8 cm, with clear margins. Due to negative lymph nodes she did not require postmastectomy radiation.  -She tried anastrozole 07/07/20 - 12/2020, stopped due to SE's (caused sluggishness and difficulty with normal activities). She has been on surveillance alone -During routine surveillance visit she was found to have localized chest wall subq recurrence of previous IDC, ER/PR both 100% strongly positive, HER2 negative, Ki 67 10% confirmed on biopsy 02/06/22.  -Staging PET scan negative for distant recurrence/metastasis -She underwent wide excision of left chest wall recurrence by Dr. Corliss Skains on 03/06/22, path shows 0.7 cm IDA involving dermis of the skin, all margins negative -Started adjuvant Tamoxifen Feb/March 2024, tolerating well until ~06/2022 she developed diarrhea, weakness, lacking motivation, as well as other vague symptoms such as "shakes" and roaring in her ear. Tamoxifen was held -08/01/22 virtual visit: she appeared improved but still not to her baseline per her report, although no specific complaints. Tamoxifen remained on hold -Cindy Wong is clinically doing well.  It took her 2 months to recover from Tamoxifen SE's, motivation and energy are back to baseline.   -We discussed further antiestrogen, she did not tolerate AI's in the past.  We discussed low dose tamoxifen 10 mg versus remaining off antiestrogen altogether.  After lengthy discussion she prefers to d/c tamoxifen -Labs reviewed, CBC normal, CMP stable.  -Continue surveillance

## 2023-02-27 ENCOUNTER — Inpatient Hospital Stay: Payer: Medicare Other | Attending: Nurse Practitioner

## 2023-02-27 ENCOUNTER — Inpatient Hospital Stay (HOSPITAL_BASED_OUTPATIENT_CLINIC_OR_DEPARTMENT_OTHER): Payer: Medicare Other | Admitting: Nurse Practitioner

## 2023-02-27 ENCOUNTER — Encounter: Payer: Self-pay | Admitting: Nurse Practitioner

## 2023-02-27 VITALS — BP 127/50 | HR 64 | Temp 97.9°F | Resp 16 | Wt 147.2 lb

## 2023-02-27 DIAGNOSIS — R5383 Other fatigue: Secondary | ICD-10-CM | POA: Diagnosis not present

## 2023-02-27 DIAGNOSIS — C50912 Malignant neoplasm of unspecified site of left female breast: Secondary | ICD-10-CM

## 2023-02-27 DIAGNOSIS — Z79899 Other long term (current) drug therapy: Secondary | ICD-10-CM | POA: Insufficient documentation

## 2023-02-27 DIAGNOSIS — Z9013 Acquired absence of bilateral breasts and nipples: Secondary | ICD-10-CM | POA: Insufficient documentation

## 2023-02-27 DIAGNOSIS — Z1721 Progesterone receptor positive status: Secondary | ICD-10-CM | POA: Diagnosis not present

## 2023-02-27 DIAGNOSIS — Z170 Estrogen receptor positive status [ER+]: Secondary | ICD-10-CM | POA: Diagnosis not present

## 2023-02-27 DIAGNOSIS — G479 Sleep disorder, unspecified: Secondary | ICD-10-CM | POA: Insufficient documentation

## 2023-02-27 DIAGNOSIS — C50112 Malignant neoplasm of central portion of left female breast: Secondary | ICD-10-CM | POA: Diagnosis not present

## 2023-02-27 DIAGNOSIS — Z79811 Long term (current) use of aromatase inhibitors: Secondary | ICD-10-CM | POA: Insufficient documentation

## 2023-02-27 DIAGNOSIS — N6489 Other specified disorders of breast: Secondary | ICD-10-CM | POA: Diagnosis not present

## 2023-02-27 DIAGNOSIS — Z882 Allergy status to sulfonamides status: Secondary | ICD-10-CM | POA: Insufficient documentation

## 2023-02-27 DIAGNOSIS — M2550 Pain in unspecified joint: Secondary | ICD-10-CM | POA: Insufficient documentation

## 2023-02-27 DIAGNOSIS — F410 Panic disorder [episodic paroxysmal anxiety] without agoraphobia: Secondary | ICD-10-CM | POA: Insufficient documentation

## 2023-02-27 LAB — CMP (CANCER CENTER ONLY)
ALT: 9 U/L (ref 0–44)
AST: 13 U/L — ABNORMAL LOW (ref 15–41)
Albumin: 4 g/dL (ref 3.5–5.0)
Alkaline Phosphatase: 52 U/L (ref 38–126)
Anion gap: 7 (ref 5–15)
BUN: 22 mg/dL (ref 8–23)
CO2: 29 mmol/L (ref 22–32)
Calcium: 9.6 mg/dL (ref 8.9–10.3)
Chloride: 104 mmol/L (ref 98–111)
Creatinine: 0.94 mg/dL (ref 0.44–1.00)
GFR, Estimated: 58 mL/min — ABNORMAL LOW (ref >60)
Glucose, Bld: 227 mg/dL — ABNORMAL HIGH (ref 70–99)
Potassium: 3.7 mmol/L (ref 3.5–5.1)
Sodium: 140 mmol/L (ref 135–145)
Total Bilirubin: 0.5 mg/dL (ref 0.0–1.2)
Total Protein: 6.7 g/dL (ref 6.5–8.1)

## 2023-02-27 LAB — CBC WITH DIFFERENTIAL (CANCER CENTER ONLY)
Abs Immature Granulocytes: 0.03 10*3/uL (ref 0.00–0.07)
Basophils Absolute: 0.1 10*3/uL (ref 0.0–0.1)
Basophils Relative: 1 %
Eosinophils Absolute: 0.1 10*3/uL (ref 0.0–0.5)
Eosinophils Relative: 2 %
HCT: 44 % (ref 36.0–46.0)
Hemoglobin: 14.1 g/dL (ref 12.0–15.0)
Immature Granulocytes: 1 %
Lymphocytes Relative: 31 %
Lymphs Abs: 2 10*3/uL (ref 0.7–4.0)
MCH: 26.8 pg (ref 26.0–34.0)
MCHC: 32 g/dL (ref 30.0–36.0)
MCV: 83.5 fL (ref 80.0–100.0)
Monocytes Absolute: 0.4 10*3/uL (ref 0.1–1.0)
Monocytes Relative: 7 %
Neutro Abs: 3.9 10*3/uL (ref 1.7–7.7)
Neutrophils Relative %: 58 %
Platelet Count: 290 10*3/uL (ref 150–400)
RBC: 5.27 MIL/uL — ABNORMAL HIGH (ref 3.87–5.11)
RDW: 13.2 % (ref 11.5–15.5)
WBC Count: 6.5 10*3/uL (ref 4.0–10.5)
nRBC: 0 % (ref 0.0–0.2)

## 2023-02-27 MED ORDER — MIRTAZAPINE 15 MG PO TABS: 15.0000 mg | ORAL_TABLET | Freq: Every day | ORAL | 1 refills | Status: DC

## 2023-03-06 DIAGNOSIS — F331 Major depressive disorder, recurrent, moderate: Secondary | ICD-10-CM | POA: Diagnosis not present

## 2023-03-19 ENCOUNTER — Ambulatory Visit: Payer: Medicare Other | Admitting: Podiatry

## 2023-03-19 ENCOUNTER — Encounter: Payer: Self-pay | Admitting: Podiatry

## 2023-03-19 DIAGNOSIS — M79676 Pain in unspecified toe(s): Secondary | ICD-10-CM | POA: Diagnosis not present

## 2023-03-19 DIAGNOSIS — E1151 Type 2 diabetes mellitus with diabetic peripheral angiopathy without gangrene: Secondary | ICD-10-CM

## 2023-03-19 DIAGNOSIS — B351 Tinea unguium: Secondary | ICD-10-CM

## 2023-03-19 NOTE — Progress Notes (Signed)
This patient returns to my office for at risk foot care.  This patient requires this care by a professional since this patient will be at risk due to having diabetes.   This patient is unable to cut nails herself since the patient cannot reach her nails.  These nails is painful walking and wearing her shoes.  She also has pain due to her callus.. This patient presents for at risk foot care today.  General Appearance  Alert, conversant and in no acute stress.  Vascular  Dorsalis pedis  pulses are palpable  Bilaterally. Posterior tibial pulses are absent  B/L.  Capillary return is within normal limits  bilaterally. Temperature is within normal limits  Bilaterally  .Absent digital hair.  Neurologic  Senn-Weinstein monofilament wire test within normal limits  bilaterally. Muscle power within normal limits bilaterally.  Nails Thick disfigured discolored nails with subungual debris  from hallux to fifth toes bilaterally. No evidence of bacterial infection or drainage bilaterally.  Orthopedic  No limitations of motion  feet .  No crepitus or effusions noted.  No bony pathology or digital deformities noted.  midfoot arthritis  B/L.  Skin  normotropic skin with no porokeratosis noted bilaterally.  No signs of infections or ulcers noted.   symptomatic  HD 5th toe left.  Porokeratosis  5th  MPJ  B/L.  Onychomycosis  Pain in right toes  Pain in left toes  .  Porokeratosis sub 5th  B/L.  Consent was obtained for treatment procedures.   Mechanical debridement of nails 1-5  bilaterally performed with a nail nipper.  Filed with dremel without incident. Debride porokeratosis with dremel tool.   Return office visit    3 months                  Told patient to return for periodic foot care and evaluation due to potential at risk complications.   Helane Gunther DPM

## 2023-03-25 ENCOUNTER — Ambulatory Visit: Payer: Medicare Other | Admitting: Cardiology

## 2023-03-26 ENCOUNTER — Other Ambulatory Visit: Payer: Self-pay | Admitting: Nurse Practitioner

## 2023-04-09 DIAGNOSIS — Z961 Presence of intraocular lens: Secondary | ICD-10-CM | POA: Diagnosis not present

## 2023-04-09 DIAGNOSIS — H401131 Primary open-angle glaucoma, bilateral, mild stage: Secondary | ICD-10-CM | POA: Diagnosis not present

## 2023-04-09 NOTE — Progress Notes (Unsigned)
I connected with Cindy Wong on 04/10/23 at  8:30 AM EST by  and verified that I am speaking with the correct person using two identifiers.   I discussed the limitations, risks, security and privacy concerns of performing an evaluation and management service by telemedicine and the availability of in-person appointments. I also discussed with the patient that there may be a patient responsible charge related to this service. The patient expressed understanding and agreed to proceed.   Other persons participating in the visit and their role in the encounter: none   Patient's location: home   Provider's location: [facility]    Chief Complaint: f/ breast cancer - increased mirtazapine to 15 mg QPM at recent visit.    Patient Care Team: Cindy Wong, Cindy Koh, MD as PCP - Cindy Wong, Cindy Ditty, RN as Oncology Nurse Navigator Cindy Angelica, RN as Oncology Nurse Navigator Cindy Mood, MD as Consulting Physician (Hematology) Cindy Samples, NP as Nurse Practitioner (Nurse Practitioner) Cindy Rudd, MD as Consulting Physician (General Surgery) [facility], [facility] as Consulting Physician (Nephrology)  Clinic Day:  04/10/2023  Referring physician: Gaspar Garbe, MD  ASSESSMENT & PLAN:   Assessment & Plan: Malignant neoplasm of central left breast (HCC)  grade 2, ER 95% strongly positive, PR 95% strongly positive, HER-2 negative (1+) Ki-67 10%, cT2 N0 M0 stage Ib; recurrence left subQ chest wall 01/2022 -Diagnosed in 10/2019, s/p left mastectomy by Dr Lynann Bologna on 12/08/19. Her surgical path showed grade II IDC, 2.8 cm, with clear margins. Due to negative lymph nodes she did not require postmastectomy radiation.  -She tried anastrozole 07/07/20 - 12/2020, stopped due to SE's (caused sluggishness and difficulty with normal activities). She has been on surveillance alone -During routine surveillance visit she was found to have localized chest wall subq recurrence  of previous IDC, ER/PR both 100% strongly positive, HER2 negative, Ki 67 10% confirmed on biopsy 02/06/22.  -Staging PET scan negative for distant recurrence/metastasis -She underwent wide excision of left chest wall recurrence by Dr. Corliss Skains on 03/06/22, path shows 0.7 cm IDA involving dermis of the skin, all margins negative -Started adjuvant Tamoxifen Feb/March 2024, tolerating well until ~06/2022 she developed diarrhea, weakness, lacking motivation, as well as other vague symptoms such as "shakes" and roaring in her ear. Tamoxifen was held -08/01/22 virtual visit: she appeared improved but still not to her baseline per her report, although no specific complaints. Tamoxifen remained on hold -Ms. Cindy Wong is clinically doing well.  It took her 2 months to recover from Tamoxifen SE's, motivation and energy are back to baseline.   -We discussed further antiestrogen, she did not tolerate AI's in the past.  We discussed low dose tamoxifen 10 mg versus remaining off antiestrogen altogether.  After lengthy discussion she prefers to d/c tamoxifen -Labs reviewed, CBC normal, CMP stable.  -Continue surveillance   Plan: Continue mirtazapine at 15 mg every evening.  Will refill as needed. Discuss elevated blood sugar with primary care. Continue cancer surveillance. Labs and follow-up as scheduled 08/28/2023.  The patient understands the plans discussed today and is in agreement with them.  She knows to contact our office if she develops concerns prior to her next appointment.  I provided 10 minutes of face-to-face time during this encounter and > 50% was spent counseling as documented under my assessment and plan.    Cindy Jews, NP  [facility] - A DEPT OF [facility]  [facility] [address] [place] [address] Dept: [phone] Dept Fax: [phone]   No orders of the defined types were placed in this encounter.     CHIEF COMPLAINT:   CC: Left breast cancer, estrogen receptor positive  Current Treatment: Surveillance  INTERVAL HISTORY:  Cindy Wong is here today for repeat clinical assessment.  She was last seen by myself on 2023/03/16.  Due to frequently waking up during the night with "panic attack" type symptoms, increased mirtazapine to 15 mg every evening.  She had also been feeling increased anxiety with decreased appetite.  Today, she reports feeling very well.  Since her visit with me on March 16, 2023, her brother passed away on her 89th birthday, 03/08/2023.  Although she did grieve the loss of her brother, she had family support.  They helped her go through his things including her home.  They helped her to get started with activities she had been putting off.  The patient states that she is now doing paperwork, accomplishing tasks she had been avoiding, sleeping better, and eating better.  She has noticed an increase in her blood sugars.  Sometimes they are 170 or 180, but other times they are in the 60s or 70s.  She does have an appointment with her primary care in early March and we will talk about her diabetic medications at that time. She denies chest pain, chest pressure, or shortness of breath. She denies headaches or visual disturbances. She denies abdominal pain, nausea, vomiting, or changes in bowel or bladder habits.   She denies fevers or chills. She denies pain. Her appetite is improved.  I have reviewed the past medical history, past surgical history, social history and family history with the patient and they are unchanged from previous note.  ALLERGIES:  is allergic to sulfamethoxazole and sulfonamide derivatives.  MEDICATIONS:  Current Outpatient Medications  Medication Sig Dispense Refill   amLODipine (NORVASC) 5 MG tablet Take 10 mg by mouth daily.     betamethasone dipropionate 0.05 % cream Apply 1 Application topically 2 (two) times daily as needed (psoriasis).     carvedilol (COREG) 12.5 MG tablet Take 1  tablet (12.5 mg total) by mouth 2 (two) times daily with a meal. 180 tablet 3   Cholecalciferol (D2000 ULTRA STRENGTH) 50 MCG (2000 UT) CAPS Take 2,000 Units by mouth daily.     glipiZIDE (GLUCOTROL XL) 10 MG 24 hr tablet Take 10 mg by mouth daily.     hydrochlorothiazide (HYDRODIURIL) 25 MG tablet Take 25 mg by mouth daily. In the morning  10   latanoprost (XALATAN) 0.005 % ophthalmic solution Place 1 drop into both eyes at bedtime.     metFORMIN (GLUCOPHAGE) 500 MG tablet Take 500 mg by mouth daily with breakfast.     mirtazapine (REMERON) 15 MG tablet TAKE 1 TABLET BY MOUTH EVERYDAY AT BEDTIME 90 tablet 1   Multiple Vitamins-Minerals (CENTRUM SILVER PO) Take 1 tablet by mouth daily.      ONETOUCH VERIO test strip 2 (two) times daily. for testing  2   potassium chloride SA (K-DUR,KLOR-CON) 20 MEQ tablet Take 20 mEq by mouth daily.     pravastatin (PRAVACHOL) 40 MG tablet Take 40 mg by mouth every evening.     No current facility-administered medications for this visit.    HISTORY OF PRESENT ILLNESS:   Oncology History Overview Note  Cancer Staging Malignant neoplasm of central left breast [facility]) Staging form: Breast, AJCC 8th Edition - Clinical stage from  11/19/2019: Stage IB (cT2, cN0, cM0, G2, ER+, PR+, HER2-) - Signed by Cindy Samples, NP on 11/23/2019 - Pathologic stage from 12/08/2019: Stage Unknown (pT2, pNX, cM0, G2, ER+, PR+, HER2-) - Signed by Cindy Mood, MD on 12/25/2019    Malignant neoplasm of central left breast (HCC)  11/09/2019 Breast US   IMPRESSION: 1. Highly suspicious left breast mass at the 12 o'clock position 4 cm from the nipple. Recommendation is for ultrasound-guided biopsy. 2. Indeterminate hyperechoic mass at the 12 o'clock position 4 cm from the nipple, just superior to the index lesion. This may correspond with an additional asymmetry seen mammographically. Recommendation is for ultrasound-guided biopsy with close attention on post clip films to ensure  mammographic correlation. 3. No suspicious left axillary lymphadenopathy. 4. No suspicious findings at the site of the right mastectomy bed palpable lump.   11/19/2019 Cancer Staging   Staging form: Breast, AJCC 8th Edition - Clinical stage from 11/19/2019: Stage IB (cT2, cN0, cM0, G2, ER+, PR+, HER2-) - Signed by Cindy Samples, NP on 11/23/2019   11/19/2019 Initial Biopsy   Diagnosis Breast, left, needle core biopsy, 12 o'clock - INVASIVE MAMMARY CARCINOMA - SEE COMMENT E-cadherin is POSITIVE supporting a ductal origin. PROGNOSTIC INDICATORS Results: IMMUNOHISTOCHEMICAL AND MORPHOMETRIC ANALYSIS PERFORMED MANUALLY The tumor cells are NEGATIVE for Her2 (1+). Estrogen Receptor: 95%, POSITIVE, STRONG STAINING INTENSITY Progesterone Receptor: 95%, POSITIVE, STRONG STAINING INTENSITY Proliferation Marker Ki67: 10%   11/23/2019 Initial Diagnosis   Malignant neoplasm of left breast (HCC)   11/29/2019 Breast MRI   IMPRESSION: 1. 3.1 cm biopsy-proven malignancy within the UPPER LEFT breast. 2. Indeterminate 0.9 cm posterior central/LOWER LEFT breast mass. If breast conservation is desired, 2nd-look ultrasound and possible biopsy is recommended. If this mass is not identified sonographically, than MR guided biopsy would be recommended. 3. No abnormal appearing lymph nodes. 4. RIGHT mastectomy without suspicious abnormalities in the RIGHT mastectomy bed.   12/08/2019 Cancer Staging   Staging form: Breast, AJCC 8th Edition - Pathologic stage from 12/08/2019: Stage Unknown (pT2, pNX, cM0, G2, ER+, PR+, HER2-) - Signed by Cindy Mood, MD on 12/25/2019   12/08/2019 Surgery   Left Mastectomy by Dr Corliss Skains   12/08/2019 Pathology Results   FINAL MICROSCOPIC DIAGNOSIS:   A. BREAST, LEFT, MASTECTOMY:  - Invasive ductal carcinoma, multifocal, 2.8 cm in greatest dimension,  Nottingham grade 2 of 3.  - Margins of resection are not involved.  - See oncology table.  - See oncology table.     03/2020 -  Anti-estrogen oral therapy   Anastrozole 1mg  once daily starting 01/2020   03/22/2020 Survivorship   SCP delivered by [name], NP        REVIEW OF SYSTEMS:   Constitutional: Denies fevers, chills or abnormal weight loss.  Improved appetite Eyes: Denies blurriness of vision Ears, nose, mouth, throat, and face: Denies mucositis or sore throat Respiratory: Denies cough, dyspnea or wheezes Cardiovascular: Denies palpitation, chest discomfort or lower extremity swelling Gastrointestinal:  Denies nausea, heartburn or change in bowel habits Skin: Denies abnormal skin rashes Lymphatics: Denies new lymphadenopathy or easy bruising Neurological:Denies numbness, tingling or new weaknesses Behavioral/Psych: Wong is stable.  Improved energy.  Sleeping better.  Feels more positive about life. All other systems were reviewed with the patient and are negative.   VITALS:   Wt Readings from Last 3 Encounters:  02/27/23 147 lb 3.2 oz (66.8 kg)  08/28/22 147 lb 3.2 oz (66.8 kg)  07/04/22 149 lb 9.6 oz (67.9 kg)  LABORATORY DATA:  I have reviewed the data as listed    Component Value Date/Time   NA 140 02/27/2023 0852   NA 142 11/05/2016 1040   K 3.7 02/27/2023 0852   K 4.1 11/05/2016 1040   CL 104 02/27/2023 0852   CL 109 (H) 10/23/2011 1527   CO2 29 02/27/2023 0852   CO2 25 11/05/2016 1040   GLUCOSE 227 (H) 02/27/2023 0852   GLUCOSE 127 11/05/2016 1040   GLUCOSE 107 (H) 10/23/2011 1527   BUN 22 02/27/2023 0852   BUN 17.9 11/05/2016 1040   CREATININE 0.94 02/27/2023 0852   CREATININE 0.9 11/05/2016 1040   CALCIUM 9.6 02/27/2023 0852   CALCIUM 10.3 11/05/2016 1040   PROT 6.7 02/27/2023 0852   PROT 7.6 11/05/2016 1040   ALBUMIN 4.0 02/27/2023 0852   ALBUMIN 4.1 11/05/2016 1040   AST 13 (L) 02/27/2023 0852   AST 21 11/05/2016 1040   ALT 9 02/27/2023 0852   ALT 17 11/05/2016 1040   ALKPHOS 52 02/27/2023 0852   ALKPHOS 55 11/05/2016 1040   BILITOT 0.5 02/27/2023  0852   BILITOT 0.61 11/05/2016 1040   GFRNONAA 58 (L) 02/27/2023 0852   GFRAA >60 11/23/2019 1049    Lab Results  Component Value Date   WBC 6.5 02/27/2023   NEUTROABS 3.9 02/27/2023   HGB 14.1 02/27/2023   HCT 44.0 02/27/2023   MCV 83.5 02/27/2023   PLT 290 02/27/2023

## 2023-04-09 NOTE — Assessment & Plan Note (Signed)
grade 2, ER 95% strongly positive, PR 95% strongly positive, HER-2 negative (1+) Ki-67 10%, cT2 N0 M0 stage Ib; recurrence left subQ chest wall 01/2022 -Diagnosed in 10/2019, s/p left mastectomy by Dr Lynann Bologna on 12/08/19. Her surgical path showed grade II IDC, 2.8 cm, with clear margins. Due to negative lymph nodes she did not require postmastectomy radiation.  -She tried anastrozole 07/07/20 - 12/2020, stopped due to SE's (caused sluggishness and difficulty with normal activities). She has been on surveillance alone -During routine surveillance visit she was found to have localized chest wall subq recurrence of previous IDC, ER/PR both 100% strongly positive, HER2 negative, Ki 67 10% confirmed on biopsy 02/06/22.  -Staging PET scan negative for distant recurrence/metastasis -She underwent wide excision of left chest wall recurrence by Dr. Corliss Skains on 03/06/22, path shows 0.7 cm IDA involving dermis of the skin, all margins negative -Started adjuvant Tamoxifen Feb/March 2024, tolerating well until ~06/2022 she developed diarrhea, weakness, lacking motivation, as well as other vague symptoms such as "shakes" and roaring in her ear. Tamoxifen was held -08/01/22 virtual visit: she appeared improved but still not to her baseline per her report, although no specific complaints. Tamoxifen remained on hold -Ms. Russett is clinically doing well.  It took her 2 months to recover from Tamoxifen SE's, motivation and energy are back to baseline.   -We discussed further antiestrogen, she did not tolerate AI's in the past.  We discussed low dose tamoxifen 10 mg versus remaining off antiestrogen altogether.  After lengthy discussion she prefers to d/c tamoxifen -Labs reviewed, CBC normal, CMP stable.  -Continue surveillance

## 2023-04-10 ENCOUNTER — Encounter: Payer: Self-pay | Admitting: Nurse Practitioner

## 2023-04-10 ENCOUNTER — Inpatient Hospital Stay: Payer: Medicare Other | Attending: Nurse Practitioner | Admitting: Nurse Practitioner

## 2023-04-10 DIAGNOSIS — C50912 Malignant neoplasm of unspecified site of left female breast: Secondary | ICD-10-CM

## 2023-04-10 DIAGNOSIS — Z170 Estrogen receptor positive status [ER+]: Secondary | ICD-10-CM

## 2023-05-01 DIAGNOSIS — K222 Esophageal obstruction: Secondary | ICD-10-CM | POA: Diagnosis not present

## 2023-05-01 DIAGNOSIS — Z9010 Acquired absence of unspecified breast and nipple: Secondary | ICD-10-CM | POA: Diagnosis not present

## 2023-05-01 DIAGNOSIS — I10 Essential (primary) hypertension: Secondary | ICD-10-CM | POA: Diagnosis not present

## 2023-05-01 DIAGNOSIS — D692 Other nonthrombocytopenic purpura: Secondary | ICD-10-CM | POA: Diagnosis not present

## 2023-05-01 DIAGNOSIS — F411 Generalized anxiety disorder: Secondary | ICD-10-CM | POA: Diagnosis not present

## 2023-05-01 DIAGNOSIS — E559 Vitamin D deficiency, unspecified: Secondary | ICD-10-CM | POA: Diagnosis not present

## 2023-05-01 DIAGNOSIS — E119 Type 2 diabetes mellitus without complications: Secondary | ICD-10-CM | POA: Diagnosis not present

## 2023-05-01 DIAGNOSIS — E7800 Pure hypercholesterolemia, unspecified: Secondary | ICD-10-CM | POA: Diagnosis not present

## 2023-05-01 DIAGNOSIS — H9191 Unspecified hearing loss, right ear: Secondary | ICD-10-CM | POA: Diagnosis not present

## 2023-05-01 DIAGNOSIS — C50919 Malignant neoplasm of unspecified site of unspecified female breast: Secondary | ICD-10-CM | POA: Diagnosis not present

## 2023-05-01 DIAGNOSIS — F331 Major depressive disorder, recurrent, moderate: Secondary | ICD-10-CM | POA: Diagnosis not present

## 2023-05-29 DIAGNOSIS — M79671 Pain in right foot: Secondary | ICD-10-CM | POA: Diagnosis not present

## 2023-05-29 DIAGNOSIS — L089 Local infection of the skin and subcutaneous tissue, unspecified: Secondary | ICD-10-CM | POA: Diagnosis not present

## 2023-05-29 DIAGNOSIS — E119 Type 2 diabetes mellitus without complications: Secondary | ICD-10-CM | POA: Diagnosis not present

## 2023-06-13 DIAGNOSIS — F411 Generalized anxiety disorder: Secondary | ICD-10-CM | POA: Diagnosis not present

## 2023-06-13 DIAGNOSIS — I10 Essential (primary) hypertension: Secondary | ICD-10-CM | POA: Diagnosis not present

## 2023-06-13 DIAGNOSIS — R197 Diarrhea, unspecified: Secondary | ICD-10-CM | POA: Diagnosis not present

## 2023-06-13 DIAGNOSIS — R634 Abnormal weight loss: Secondary | ICD-10-CM | POA: Diagnosis not present

## 2023-06-13 DIAGNOSIS — E119 Type 2 diabetes mellitus without complications: Secondary | ICD-10-CM | POA: Diagnosis not present

## 2023-06-18 ENCOUNTER — Ambulatory Visit (INDEPENDENT_AMBULATORY_CARE_PROVIDER_SITE_OTHER): Payer: Medicare Other | Admitting: Podiatry

## 2023-06-18 ENCOUNTER — Encounter: Payer: Self-pay | Admitting: Podiatry

## 2023-06-18 DIAGNOSIS — E1151 Type 2 diabetes mellitus with diabetic peripheral angiopathy without gangrene: Secondary | ICD-10-CM

## 2023-06-18 DIAGNOSIS — B351 Tinea unguium: Secondary | ICD-10-CM | POA: Diagnosis not present

## 2023-06-18 DIAGNOSIS — M79676 Pain in unspecified toe(s): Secondary | ICD-10-CM | POA: Diagnosis not present

## 2023-06-18 NOTE — Progress Notes (Signed)
 This patient returns to my office for at risk foot care.  This patient requires this care by a professional since this patient will be at risk due to having diabetes.   This patient is unable to cut nails herself since the patient cannot reach her nails.  These nails is painful walking and wearing her shoes.  She also has pain due to her callus.. This patient presents for at risk foot care today.  General Appearance  Alert, conversant and in no acute stress.  Vascular  Dorsalis pedis  pulses are palpable  Bilaterally. Posterior tibial pulses are absent  B/L.  Capillary return is within normal limits  bilaterally. Temperature is within normal limits  Bilaterally  .Absent digital hair.  Neurologic  Senn-Weinstein monofilament wire test within normal limits  bilaterally. Muscle power within normal limits bilaterally.  Nails Thick disfigured discolored nails with subungual debris  from hallux to fifth toes bilaterally. No evidence of bacterial infection or drainage bilaterally.  Orthopedic  No limitations of motion  feet .  No crepitus or effusions noted.  No bony pathology or digital deformities noted.  midfoot arthritis  B/L.  Skin  normotropic skin with no porokeratosis noted bilaterally.  No signs of infections or ulcers noted.   symptomatic  HD 5th toe left.  Porokeratosis  5th  MPJ  B/L.  Onychomycosis  Pain in right toes  Pain in left toes  .  Porokeratosis sub 5th  B/L.  Consent was obtained for treatment procedures.   Mechanical debridement of nails 1-5  bilaterally performed with a nail nipper.  Filed with dremel without incident. Debride porokeratosis with dremel tool.   Return office visit    3 months                  Told patient to return for periodic foot care and evaluation due to potential at risk complications.   Helane Gunther DPM

## 2023-07-02 ENCOUNTER — Ambulatory Visit: Attending: Cardiology | Admitting: Cardiology

## 2023-07-02 ENCOUNTER — Encounter: Payer: Self-pay | Admitting: Cardiology

## 2023-07-02 VITALS — BP 128/68 | HR 73 | Resp 16 | Ht 68.0 in | Wt 147.8 lb

## 2023-07-02 DIAGNOSIS — E782 Mixed hyperlipidemia: Secondary | ICD-10-CM | POA: Diagnosis present

## 2023-07-02 DIAGNOSIS — I10 Essential (primary) hypertension: Secondary | ICD-10-CM

## 2023-07-02 NOTE — Patient Instructions (Signed)
 Follow-Up: At [facility], you and your health needs are our priority.  As part of our continuing mission to provide you with exceptional heart care, our providers are all part of one team.  This team includes your primary Cardiologist (physician) and Advanced Practice Providers or APPs (Physician Assistants and Nurse Practitioners) who all work together to provide you with the care you need, when you need it.  Your next appointment:   As needed   Provider:   Cody Das, MD    We recommend signing up for the patient portal called "MyChart".  Sign up information is provided on this After Visit Summary.  MyChart is used to connect with patients for Virtual Visits (Telemedicine).  Patients are able to view lab/test results, encounter notes, upcoming appointments, etc.  Non-urgent messages can be sent to your provider as well.   To learn more about what you can do with MyChart, go to [email or web address].

## 2023-07-02 NOTE — Progress Notes (Signed)
  Cardiology Office Note:  .   Date:  07/02/2023  ID:  Cindy Wong, DOB 1931/09/20, MRN [id number] PCP: Suzzanne Estrin, MD  [facility] Providers Cardiologist:  Fransico Ivy, MD PCP: Suzzanne Estrin, MD  Chief Complaint  Patient presents with   Hypertension     Cindy Wong is a 89 y.o. female with hypertension, type 2 diabetes mellitus, hyperlipidemia, seen for frequent PVCs/ventricular bigeminy, abnormal stress test    History of Present Illness  Patient is doing well.  She has no cardiac complaints today.  She still lives independently and able to do all her activities of daily living without any difficulty.  Diabetes is managed by PCP, recent A1c was noted to be 7.6%.     Vitals:   07/02/23 1040  BP: 128/68  Pulse: 73  Resp: 16  SpO2: 98%      Review of Systems  Cardiovascular:  Negative for chest pain, dyspnea on exertion, leg swelling, palpitations and syncope.        Studies Reviewed: Aaron Aas        EKG 07/02/2023: Sinus rhythm with Premature atrial complexes When compared with ECG of 02-Apr-2022 19:13, Premature atrial complexes are now Present    Carotid artery duplex 07/2022: Duplex suggests stenosis in the right internal carotid artery (minimal). Duplex suggests stenosis in the left internal carotid artery (minimal). < 50% stenosis in the left external carotid artery. Antegrade right vertebral artery flow. Antegrade left vertebral artery flow.  Independently interpreted 02/2023: Chol 134, TG 123, HDL 51, LDL 61 HbA1C 7.6% Hb 14.1 Cr 0.94, eGFR 58    Physical Exam Vitals and nursing note reviewed.  Constitutional:      General: She is not in acute distress. Neck:     Vascular: No JVD.  Cardiovascular:     Rate and Rhythm: Normal rate and regular rhythm.     Heart sounds: Normal heart sounds. No murmur heard. Pulmonary:     Effort: Pulmonary effort is normal.     Breath sounds: Normal breath sounds. No  wheezing or rales.  Musculoskeletal:     Right lower leg: No edema.     Left lower leg: No edema.      VISIT DIAGNOSES:   ICD-10-CM   1. Essential hypertension  I10 EKG 12-Lead    2. Mixed hyperlipidemia  E78.2        Cindy Wong is a 89 y.o. female with hypertension, type 2 diabetes mellitus, hyperlipidemia, seen for frequent PVCs/ventricular bigeminy, abnormal stress test  Assessment and Plan Assessment & Plan  Hypertension: Well-controlled.  Continue current antihypertensive therapy, including amlodipine , hydrochlorothiazide , carvedilol .  Mixed hyperlipidemia: Lipids well-controlled on pravastatin  40 mg daily.  Continue the same.      F/u as needed  Signed, Cody Das, MD

## 2023-07-11 DIAGNOSIS — E7800 Pure hypercholesterolemia, unspecified: Secondary | ICD-10-CM | POA: Diagnosis not present

## 2023-07-11 DIAGNOSIS — I10 Essential (primary) hypertension: Secondary | ICD-10-CM | POA: Diagnosis not present

## 2023-07-11 DIAGNOSIS — D692 Other nonthrombocytopenic purpura: Secondary | ICD-10-CM | POA: Diagnosis not present

## 2023-07-11 DIAGNOSIS — R300 Dysuria: Secondary | ICD-10-CM | POA: Diagnosis not present

## 2023-07-11 DIAGNOSIS — R5383 Other fatigue: Secondary | ICD-10-CM | POA: Diagnosis not present

## 2023-07-11 DIAGNOSIS — N390 Urinary tract infection, site not specified: Secondary | ICD-10-CM | POA: Diagnosis not present

## 2023-07-11 DIAGNOSIS — E119 Type 2 diabetes mellitus without complications: Secondary | ICD-10-CM | POA: Diagnosis not present

## 2023-07-12 DIAGNOSIS — H04123 Dry eye syndrome of bilateral lacrimal glands: Secondary | ICD-10-CM | POA: Diagnosis not present

## 2023-07-12 DIAGNOSIS — H401131 Primary open-angle glaucoma, bilateral, mild stage: Secondary | ICD-10-CM | POA: Diagnosis not present

## 2023-07-12 DIAGNOSIS — Z961 Presence of intraocular lens: Secondary | ICD-10-CM | POA: Diagnosis not present

## 2023-07-18 ENCOUNTER — Telehealth: Payer: Self-pay

## 2023-07-18 ENCOUNTER — Other Ambulatory Visit (HOSPITAL_COMMUNITY): Payer: Self-pay

## 2023-07-18 NOTE — Telephone Encounter (Signed)
 PAP: Patient assistance application for Tradjenta  through [employer]) has been mailed to pt's home address on file. Provider portion of application will be faxed to provider's office.  Provider portion of application has been faxed to Dr. Richard Tisovec at [facility]

## 2023-07-23 NOTE — Telephone Encounter (Signed)
 Received provider portion of patient application

## 2023-07-27 ENCOUNTER — Other Ambulatory Visit: Payer: Self-pay | Admitting: Cardiology

## 2023-07-27 DIAGNOSIS — I10 Essential (primary) hypertension: Secondary | ICD-10-CM

## 2023-08-01 ENCOUNTER — Other Ambulatory Visit (HOSPITAL_COMMUNITY): Payer: Self-pay

## 2023-08-01 NOTE — Telephone Encounter (Signed)
 Spoke with patient regarding patient assistance application mailed on 5/29.  Patient did not receive the application.  A new one will be mailed 6/12

## 2023-08-05 DIAGNOSIS — L910 Hypertrophic scar: Secondary | ICD-10-CM | POA: Diagnosis not present

## 2023-08-05 DIAGNOSIS — D225 Melanocytic nevi of trunk: Secondary | ICD-10-CM | POA: Diagnosis not present

## 2023-08-05 DIAGNOSIS — L821 Other seborrheic keratosis: Secondary | ICD-10-CM | POA: Diagnosis not present

## 2023-08-05 DIAGNOSIS — L814 Other melanin hyperpigmentation: Secondary | ICD-10-CM | POA: Diagnosis not present

## 2023-08-09 DIAGNOSIS — F411 Generalized anxiety disorder: Secondary | ICD-10-CM | POA: Diagnosis not present

## 2023-08-09 DIAGNOSIS — Z9010 Acquired absence of unspecified breast and nipple: Secondary | ICD-10-CM | POA: Diagnosis not present

## 2023-08-09 DIAGNOSIS — E7800 Pure hypercholesterolemia, unspecified: Secondary | ICD-10-CM | POA: Diagnosis not present

## 2023-08-09 DIAGNOSIS — E559 Vitamin D deficiency, unspecified: Secondary | ICD-10-CM | POA: Diagnosis not present

## 2023-08-09 DIAGNOSIS — E119 Type 2 diabetes mellitus without complications: Secondary | ICD-10-CM | POA: Diagnosis not present

## 2023-08-09 DIAGNOSIS — F331 Major depressive disorder, recurrent, moderate: Secondary | ICD-10-CM | POA: Diagnosis not present

## 2023-08-09 DIAGNOSIS — C50919 Malignant neoplasm of unspecified site of unspecified female breast: Secondary | ICD-10-CM | POA: Diagnosis not present

## 2023-08-09 DIAGNOSIS — I10 Essential (primary) hypertension: Secondary | ICD-10-CM | POA: Diagnosis not present

## 2023-08-09 DIAGNOSIS — K222 Esophageal obstruction: Secondary | ICD-10-CM | POA: Diagnosis not present

## 2023-08-16 NOTE — Telephone Encounter (Signed)
 Left HIPAA compliant voicemail for patient regarding patient assistance application mailed on 6/12.

## 2023-08-16 NOTE — Telephone Encounter (Signed)
 Per [place] patient has been approved for PAP through 02/19/2024

## 2023-08-16 NOTE — Progress Notes (Signed)
 Pharmacy Medication Assistance Program Note    08/16/2023  Patient ID: Cindy Wong, female   DOB: 12-02-1931, 89 y.o.   MRN: [id number]     07/18/2023  Outreach Medication One  Initial Outreach Date (Medication One) 07/18/2023  Manufacturer Medication One [employer]  [employer] [facility] Tradjenta   Dose of Tradjenta  5mg   Type of Radiographer, therapeutic Assistance  Date Application Sent to Patient 07/19/2023  Application Items Requested Application;Proof of Income  Date Application Sent to Prescriber 07/18/2023  Name of Prescriber Richard Tisovec  Date Application Received From Provider 07/23/2023  Patient Assistance Determination Approved  Approval Start Date 08/16/2023  Approval End Date 02/19/2024     Signature

## 2023-08-27 ENCOUNTER — Other Ambulatory Visit: Payer: Self-pay | Admitting: Nurse Practitioner

## 2023-08-27 DIAGNOSIS — C50912 Malignant neoplasm of unspecified site of left female breast: Secondary | ICD-10-CM

## 2023-08-27 NOTE — Assessment & Plan Note (Addendum)
 grade 2, ER 95% strongly positive, PR 95% strongly positive, HER-2 negative (1+) Ki-67 10%, cT2 N0 M0 stage Ib; recurrence left subQ chest wall 01/2022 -Diagnosed in 10/2019, s/p left mastectomy by Dr Alphonsa on 12/08/19. Her surgical path showed grade II IDC, 2.8 cm, with clear margins. Due to negative lymph nodes she did not require postmastectomy radiation.  -She tried anastrozole  07/07/20 - 12/2020, stopped due to SE's (caused sluggishness and difficulty with normal activities). She has been on surveillance alone -During routine surveillance visit she was found to have localized chest wall subq recurrence of previous IDC, ER/PR both 100% strongly positive, HER2 negative, Ki 67 10% confirmed on biopsy 02/06/22.  -Staging PET scan negative for distant recurrence/metastasis -She underwent wide excision of left chest wall recurrence by Dr. Belinda on 03/06/22, path shows 0.7 cm IDA involving dermis of the skin, all margins negative -Started adjuvant Tamoxifen  Feb/March 2024, tolerating well until ~06/2022 she developed diarrhea, weakness, lacking motivation, as well as other vague symptoms such as shakes and roaring in her ear. Tamoxifen  was held -08/01/22 virtual visit: she appeared improved but still not to her baseline per her report, although no specific complaints. Tamoxifen  remained on hold -Cindy Wong is clinically doing well.  It took her 2 months to recover from Tamoxifen  SE's, motivation and energy are back to baseline.   -Further antiestrogen therapy was discussed. She did not tolerate AI's in the past. Low dose tamoxifen  10 mg was discussed versus remaining off antiestrogen altogether.  After lengthy discussion she prefers to d/c tamoxifen  -Labs reviewed, CBC normal, CMP stable.  -Continue surveillance with labs and follow-up in 6 months, sooner if needed.

## 2023-08-27 NOTE — Progress Notes (Signed)
 3D screening mam Patient Care Team: Tisovec, Charlie ORN, MD as PCP - General Patwardhan, Newman PARAS, MD as PCP - Cardiology (Cardiology) Glean Stephane BROCKS, RN (Inactive) as Oncology Nurse Navigator Tyree Nanetta SAILOR, RN as Oncology Nurse Navigator Lanny Callander, MD as Consulting Physician (Hematology) Burton, Lacie K, NP as Nurse Practitioner (Nurse Practitioner) Belinda Cough, MD as Consulting Physician (General Surgery) Pa, [facility] as Consulting Physician (Nephrology)  Clinic Day:  09/01/2023  Referring physician: Tisovec, Richard W, MD  ASSESSMENT & PLAN:   Assessment & Plan: Malignant neoplasm of central left breast (HCC)  grade 2, ER 95% strongly positive, PR 95% strongly positive, HER-2 negative (1+) Ki-67 10%, cT2 N0 M0 stage Ib; recurrence left subQ chest wall 01/2022 -Diagnosed in 10/2019, s/p left mastectomy by Dr Alphonsa on 12/08/19. Her surgical path showed grade II IDC, 2.8 cm, with clear margins. Due to negative lymph nodes she did not require postmastectomy radiation.  -She tried anastrozole  07/07/20 - 12/2020, stopped due to SE's (caused sluggishness and difficulty with normal activities). She has been on surveillance alone -During routine surveillance visit she was found to have localized chest wall subq recurrence of previous IDC, ER/PR both 100% strongly positive, HER2 negative, Ki 67 10% confirmed on biopsy 02/06/22.  -Staging PET scan negative for distant recurrence/metastasis -She underwent wide excision of left chest wall recurrence by Dr. Belinda on 03/06/22, path shows 0.7 cm IDA involving dermis of the skin, all margins negative -Started adjuvant Tamoxifen  Feb/March 2024, tolerating well until ~06/2022 she developed diarrhea, weakness, lacking motivation, as well as other vague symptoms such as shakes and roaring in her ear. Tamoxifen  was held -08/01/22 virtual visit: she appeared improved but still not to her baseline per her report, although no specific  complaints. Tamoxifen  remained on hold -Ms. Cropley is clinically doing well.  It took her 2 months to recover from Tamoxifen  SE's, motivation and energy are back to baseline.   -Further antiestrogen therapy was discussed. She did not tolerate AI's in the past. Low dose tamoxifen  10 mg was discussed versus remaining off antiestrogen altogether.  After lengthy discussion she prefers to d/c tamoxifen  -Labs reviewed, CBC normal, CMP stable.  -Continue surveillance with labs and follow-up in 6 months, sooner if needed.   Mild anxiety Has been well-managed on mirtazapine  15 mg every evening.  Has good family and social support also.  Continue mirtazapine  15 mg every evening.  Plan Reviewed labs. -CBC and CMP are unremarkable. Bilateral mastectomy makes mammography unnecessary. Exam benign today. Continue with breast cancer surveillance. Labs and follow-up in 6 months, sooner if needed. The patient understands the plans discussed today and is in agreement with them.  She knows to contact our office if she develops concerns prior to her next appointment.  I provided 25 minutes of face-to-face time during this encounter and > 50% was spent counseling as documented under my assessment and plan.    Powell FORBES Lessen, NP  [facility] - A DEPT OF [facility] [address] [place] [address] Dept: [phone] Dept Fax: [phone]   No orders of the defined types were placed in this encounter.     CHIEF COMPLAINT:  CC: Left breast cancer, estrogen receptor positive  Current Treatment: Surveillance  INTERVAL HISTORY:  Marina is here today for repeat clinical assessment.  Telephone visit with me on 04/10/2023.  Had increased the dose of mirtazapine  to 15 mg every evening.  She reported improved anxiety and appetite since increasing the dose.  She reports that she continues to do very well with higher dose of mirtazapine .  She  states her blood sugars have been running slightly elevated.  Her primary provider started her on Tradjenta  last week.  Thus far.  She has been tolerating well.  She denies other concerns or complaints.  She denies chest pain, chest pressure, or shortness of breath. She denies headaches or visual disturbances. She denies abdominal pain, nausea, vomiting, or changes in bowel or bladder habits.   She denies fevers or chills. She denies pain. Her appetite is good. Her weight has been stable.  I have reviewed the past medical history, past surgical history, social history and family history with the patient and they are unchanged from previous note.  ALLERGIES:  is allergic to sulfamethoxazole and sulfonamide derivatives.  MEDICATIONS:  Current Outpatient Medications  Medication Sig Dispense Refill   amLODipine  (NORVASC ) 5 MG tablet Take 10 mg by mouth daily.     carvedilol  (COREG ) 12.5 MG tablet TAKE 1 TABLET (12.5MG  TOTAL) BY MOUTH TWICE A DAY WITH MEALS 180 tablet 3   Cholecalciferol (D2000 ULTRA STRENGTH) 50 MCG (2000 UT) CAPS Take 2,000 Units by mouth daily.     glipiZIDE  (GLUCOTROL  XL) 10 MG 24 hr tablet Take 10 mg by mouth daily.     hydrochlorothiazide  (HYDRODIURIL ) 25 MG tablet Take 25 mg by mouth daily. In the morning  10   latanoprost (XALATAN) 0.005 % ophthalmic solution Place 1 drop into both eyes at bedtime.     linagliptin  (TRADJENTA ) 5 MG TABS tablet Take 5 mg by mouth daily.     metFORMIN  (GLUCOPHAGE ) 500 MG tablet Take 500 mg by mouth daily with breakfast.     mirtazapine  (REMERON ) 15 MG tablet TAKE 1 TABLET BY MOUTH EVERYDAY AT BEDTIME 90 tablet 1   Multiple Vitamins-Minerals (CENTRUM SILVER PO) Take 1 tablet by mouth daily.      ONETOUCH VERIO test strip 2 (two) times daily. for testing  2   potassium chloride  SA (K-DUR,KLOR-CON ) 20 MEQ tablet Take 20 mEq by mouth daily.     pravastatin  (PRAVACHOL ) 40 MG tablet Take 40 mg by mouth every evening.     No current  facility-administered medications for this visit.    HISTORY OF PRESENT ILLNESS:   Oncology History Overview Note  Cancer Staging Malignant neoplasm of central left breast [facility]) Staging form: Breast, AJCC 8th Edition - Clinical stage from 11/19/2019: Stage IB (cT2, cN0, cM0, G2, ER+, PR+, HER2-) - Signed by Burton, Lacie K, NP on 11/23/2019 - Pathologic stage from 12/08/2019: Stage Unknown (pT2, pNX, cM0, G2, ER+, PR+, HER2-) - Signed by Lanny Callander, MD on 12/25/2019    Malignant neoplasm of central left breast (HCC)  11/09/2019 Breast US    IMPRESSION: 1. Highly suspicious left breast mass at the 12 o'clock position 4 cm from the nipple. Recommendation is for ultrasound-guided biopsy. 2. Indeterminate hyperechoic mass at the 12 o'clock position 4 cm from the nipple, just superior to the index lesion. This may correspond with an additional asymmetry seen mammographically. Recommendation is for ultrasound-guided biopsy with close attention on post clip films to ensure mammographic correlation. 3. No suspicious left axillary lymphadenopathy. 4. No suspicious findings at the site of the right mastectomy bed palpable lump.   11/19/2019 Cancer Staging   Staging form: Breast, AJCC 8th Edition - Clinical stage from 11/19/2019: Stage IB (cT2, cN0, cM0, G2, ER+, PR+, HER2-) - Signed by  Burton, Lacie K, NP on 11/23/2019   11/19/2019 Initial Biopsy   Diagnosis Breast, left, needle core biopsy, 12 o'clock - INVASIVE MAMMARY CARCINOMA - SEE COMMENT E-cadherin is POSITIVE supporting a ductal origin. PROGNOSTIC INDICATORS Results: IMMUNOHISTOCHEMICAL AND MORPHOMETRIC ANALYSIS PERFORMED MANUALLY The tumor cells are NEGATIVE for Her2 (1+). Estrogen Receptor: 95%, POSITIVE, STRONG STAINING INTENSITY Progesterone Receptor: 95%, POSITIVE, STRONG STAINING INTENSITY Proliferation Marker Ki67: 10%   11/23/2019 Initial Diagnosis   Malignant neoplasm of left breast (HCC)   11/29/2019 Breast MRI    IMPRESSION: 1. 3.1 cm biopsy-proven malignancy within the UPPER LEFT breast. 2. Indeterminate 0.9 cm posterior central/LOWER LEFT breast mass. If breast conservation is desired, 2nd-look ultrasound and possible biopsy is recommended. If this mass is not identified sonographically, than MR guided biopsy would be recommended. 3. No abnormal appearing lymph nodes. 4. RIGHT mastectomy without suspicious abnormalities in the RIGHT mastectomy bed.   12/08/2019 Cancer Staging   Staging form: Breast, AJCC 8th Edition - Pathologic stage from 12/08/2019: Stage Unknown (pT2, pNX, cM0, G2, ER+, PR+, HER2-) - Signed by Lanny Callander, MD on 12/25/2019   12/08/2019 Surgery   Left Mastectomy by Dr Belinda   12/08/2019 Pathology Results   FINAL MICROSCOPIC DIAGNOSIS:   A. BREAST, LEFT, MASTECTOMY:  - Invasive ductal carcinoma, multifocal, 2.8 cm in greatest dimension,  Nottingham grade 2 of 3.  - Margins of resection are not involved.  - See oncology table.  - See oncology table.    03/2020 -  Anti-estrogen oral therapy   Anastrozole  1mg  once daily starting 01/2020   03/22/2020 Survivorship   SCP delivered by Lacie Burton, NP        REVIEW OF SYSTEMS:   Constitutional: Denies fevers, chills or abnormal weight loss Eyes: Denies blurriness of vision Ears, nose, mouth, throat, and face: Denies mucositis or sore throat Respiratory: Denies cough, dyspnea or wheezes Cardiovascular: Denies palpitation, chest discomfort or lower extremity swelling Gastrointestinal:  Denies nausea, heartburn or change in bowel habits Skin: Denies abnormal skin rashes Lymphatics: Denies new lymphadenopathy or easy bruising Neurological:Denies numbness, tingling or new weaknesses Behavioral/Psych: Mood is stable, no new changes  All other systems were reviewed with the patient and are negative.   VITALS:   Today's Vitals   08/28/23 0924 08/28/23 0934  BP: 126/78   Pulse: 72   Resp: 17   Temp: 97.8 F (36.6 C)    SpO2: 99%   Weight: 150 lb (68 kg)   PainSc:  0-No pain   Body mass index is 22.81 kg/m.   Wt Readings from Last 3 Encounters:  08/28/23 150 lb (68 kg)  07/02/23 147 lb 12.8 oz (67 kg)  02/27/23 147 lb 3.2 oz (66.8 kg)    Body mass index is 22.81 kg/m.  Performance status (ECOG): 1 - Symptomatic but completely ambulatory  PHYSICAL EXAM:   GENERAL:alert, no distress and comfortable SKIN: skin color, texture, turgor are normal, no rashes or significant lesions EYES: normal, Conjunctiva are pink and non-injected, sclera clear OROPHARYNX:no exudate, no erythema and lips, buccal mucosa, and tongue normal  NECK: supple, thyroid  normal size, non-tender, without nodularity LYMPH:  no palpable lymphadenopathy in the cervical, axillary or inguinal LUNGS: clear to auscultation and percussion with normal breathing effort HEART: regular rate & rhythm and no murmurs and no lower extremity edema ABDOMEN:abdomen soft, non-tender and normal bowel sounds Musculoskeletal:no cyanosis of digits and no clubbing  NEURO: alert & oriented x 3 with fluent speech, no focal motor/sensory deficits BREAST: Bilateral total  mastectomy.  Right chest wall without palpable mass or lumps.  Well-healed surgical lumpectomy scar.  There is no axillary lymphadenopathy on the right.  Left chest wall is without palpable lumps or masses.  Well-healed surgical mastectomy scar.  There is no axillary lymphadenopathy on the left.  LABORATORY DATA:  I have reviewed the data as listed    Component Value Date/Time   NA 141 08/28/2023 0902   NA 142 11/05/2016 1040   K 3.8 08/28/2023 0902   K 4.1 11/05/2016 1040   CL 106 08/28/2023 0902   CL 109 (H) 10/23/2011 1527   CO2 29 08/28/2023 0902   CO2 25 11/05/2016 1040   GLUCOSE 208 (H) 08/28/2023 0902   GLUCOSE 127 11/05/2016 1040   GLUCOSE 107 (H) 10/23/2011 1527   BUN 18 08/28/2023 0902   BUN 17.9 11/05/2016 1040   CREATININE 0.87 08/28/2023 0902   CREATININE 0.9  11/05/2016 1040   CALCIUM 10.0 08/28/2023 0902   CALCIUM 10.3 11/05/2016 1040   PROT 6.9 08/28/2023 0902   PROT 7.6 11/05/2016 1040   ALBUMIN 4.1 08/28/2023 0902   ALBUMIN 4.1 11/05/2016 1040   AST 20 08/28/2023 0902   AST 21 11/05/2016 1040   ALT 15 08/28/2023 0902   ALT 17 11/05/2016 1040   ALKPHOS 46 08/28/2023 0902   ALKPHOS 55 11/05/2016 1040   BILITOT 0.5 08/28/2023 0902   BILITOT 0.61 11/05/2016 1040   GFRNONAA >60 08/28/2023 0902   GFRAA >60 11/23/2019 1049    Lab Results  Component Value Date   WBC 7.1 08/28/2023   NEUTROABS 4.3 08/28/2023   HGB 13.4 08/28/2023   HCT 42.1 08/28/2023   MCV 83.2 08/28/2023   PLT 277 08/28/2023

## 2023-08-28 ENCOUNTER — Inpatient Hospital Stay: Payer: Medicare Other | Attending: Nurse Practitioner

## 2023-08-28 ENCOUNTER — Other Ambulatory Visit: Payer: Self-pay

## 2023-08-28 ENCOUNTER — Inpatient Hospital Stay (HOSPITAL_BASED_OUTPATIENT_CLINIC_OR_DEPARTMENT_OTHER): Payer: Medicare Other | Admitting: Nurse Practitioner

## 2023-08-28 VITALS — BP 126/78 | HR 72 | Temp 97.8°F | Resp 17 | Wt 150.0 lb

## 2023-08-28 DIAGNOSIS — Z79811 Long term (current) use of aromatase inhibitors: Secondary | ICD-10-CM | POA: Insufficient documentation

## 2023-08-28 DIAGNOSIS — Z882 Allergy status to sulfonamides status: Secondary | ICD-10-CM | POA: Diagnosis not present

## 2023-08-28 DIAGNOSIS — Z1721 Progesterone receptor positive status: Secondary | ICD-10-CM | POA: Insufficient documentation

## 2023-08-28 DIAGNOSIS — Z79899 Other long term (current) drug therapy: Secondary | ICD-10-CM | POA: Diagnosis not present

## 2023-08-28 DIAGNOSIS — C50112 Malignant neoplasm of central portion of left female breast: Secondary | ICD-10-CM | POA: Diagnosis not present

## 2023-08-28 DIAGNOSIS — Z9013 Acquired absence of bilateral breasts and nipples: Secondary | ICD-10-CM | POA: Diagnosis not present

## 2023-08-28 DIAGNOSIS — Z170 Estrogen receptor positive status [ER+]: Secondary | ICD-10-CM | POA: Insufficient documentation

## 2023-08-28 DIAGNOSIS — Z1732 Human epidermal growth factor receptor 2 negative status: Secondary | ICD-10-CM | POA: Diagnosis not present

## 2023-08-28 DIAGNOSIS — C50912 Malignant neoplasm of unspecified site of left female breast: Secondary | ICD-10-CM

## 2023-08-28 DIAGNOSIS — N6489 Other specified disorders of breast: Secondary | ICD-10-CM | POA: Diagnosis not present

## 2023-08-28 LAB — CMP (CANCER CENTER ONLY)
ALT: 15 U/L (ref 0–44)
AST: 20 U/L (ref 15–41)
Albumin: 4.1 g/dL (ref 3.5–5.0)
Alkaline Phosphatase: 46 U/L (ref 38–126)
Anion gap: 6 (ref 5–15)
BUN: 18 mg/dL (ref 8–23)
CO2: 29 mmol/L (ref 22–32)
Calcium: 10 mg/dL (ref 8.9–10.3)
Chloride: 106 mmol/L (ref 98–111)
Creatinine: 0.87 mg/dL (ref 0.44–1.00)
GFR, Estimated: >60 mL/min (ref >60)
Glucose, Bld: 208 mg/dL — ABNORMAL HIGH (ref 70–99)
Potassium: 3.8 mmol/L (ref 3.5–5.1)
Sodium: 141 mmol/L (ref 135–145)
Total Bilirubin: 0.5 mg/dL (ref 0.0–1.2)
Total Protein: 6.9 g/dL (ref 6.5–8.1)

## 2023-08-28 LAB — CBC WITH DIFFERENTIAL (CANCER CENTER ONLY)
Abs Immature Granulocytes: 0.03 K/uL (ref 0.00–0.07)
Basophils Absolute: 0.1 K/uL (ref 0.0–0.1)
Basophils Relative: 1 %
Eosinophils Absolute: 0.1 K/uL (ref 0.0–0.5)
Eosinophils Relative: 1 %
HCT: 42.1 % (ref 36.0–46.0)
Hemoglobin: 13.4 g/dL (ref 12.0–15.0)
Immature Granulocytes: 0 %
Lymphocytes Relative: 29 %
Lymphs Abs: 2.1 K/uL (ref 0.7–4.0)
MCH: 26.5 pg (ref 26.0–34.0)
MCHC: 31.8 g/dL (ref 30.0–36.0)
MCV: 83.2 fL (ref 80.0–100.0)
Monocytes Absolute: 0.5 K/uL (ref 0.1–1.0)
Monocytes Relative: 7 %
Neutro Abs: 4.3 K/uL (ref 1.7–7.7)
Neutrophils Relative %: 62 %
Platelet Count: 277 K/uL (ref 150–400)
RBC: 5.06 MIL/uL (ref 3.87–5.11)
RDW: 13.3 % (ref 11.5–15.5)
WBC Count: 7.1 K/uL (ref 4.0–10.5)
nRBC: 0 % (ref 0.0–0.2)

## 2023-09-01 ENCOUNTER — Encounter: Payer: Self-pay | Admitting: Nurse Practitioner

## 2023-09-10 ENCOUNTER — Encounter: Payer: Self-pay | Admitting: Podiatry

## 2023-09-10 ENCOUNTER — Ambulatory Visit (INDEPENDENT_AMBULATORY_CARE_PROVIDER_SITE_OTHER): Admitting: Podiatry

## 2023-09-10 DIAGNOSIS — Q828 Other specified congenital malformations of skin: Secondary | ICD-10-CM

## 2023-09-10 DIAGNOSIS — B351 Tinea unguium: Secondary | ICD-10-CM

## 2023-09-10 DIAGNOSIS — M79676 Pain in unspecified toe(s): Secondary | ICD-10-CM

## 2023-09-10 DIAGNOSIS — E1151 Type 2 diabetes mellitus with diabetic peripheral angiopathy without gangrene: Secondary | ICD-10-CM | POA: Diagnosis not present

## 2023-09-10 NOTE — Progress Notes (Signed)
 This patient returns to my office for at risk foot care.  This patient requires this care by a professional since this patient will be at risk due to having diabetes.   This patient is unable to cut nails herself since the patient cannot reach her nails.  These nails is painful walking and wearing her shoes.  She also has pain due to her callus.. This patient presents for at risk foot care today.  General Appearance  Alert, conversant and in no acute stress.  Vascular  Dorsalis pedis  pulses are palpable  Bilaterally. Posterior tibial pulses are absent  B/L.  Capillary return is within normal limits  bilaterally. Temperature is within normal limits  Bilaterally  .Absent digital hair.  Neurologic  Senn-Weinstein monofilament wire test within normal limits  bilaterally. Muscle power within normal limits bilaterally.  Nails Thick disfigured discolored nails with subungual debris  from hallux to fifth toes bilaterally. No evidence of bacterial infection or drainage bilaterally.  Orthopedic  No limitations of motion  feet .  No crepitus or effusions noted.  No bony pathology or digital deformities noted.  midfoot arthritis  B/L.  Skin  normotropic skin with no porokeratosis noted bilaterally.  No signs of infections or ulcers noted.   asymptomatic  HD 5th toe left.  Porokeratosis  5th  MPJ  B/L.  Onychomycosis  Pain in right toes  Pain in left toes  .  Porokeratosis sub 5th  B/L.  Consent was obtained for treatment procedures.   Mechanical debridement of nails 1-5  bilaterally performed with a nail nipper.  Filed with dremel without incident. Debride porokeratosis with dremel tool.   Return office visit    3 months                  Told patient to return for periodic foot care and evaluation due to potential at risk complications.   Cordella Bold DPM

## 2023-09-30 DIAGNOSIS — C50912 Malignant neoplasm of unspecified site of left female breast: Secondary | ICD-10-CM | POA: Diagnosis not present

## 2023-10-05 ENCOUNTER — Other Ambulatory Visit: Payer: Self-pay | Admitting: Nurse Practitioner

## 2023-10-07 DIAGNOSIS — R350 Frequency of micturition: Secondary | ICD-10-CM | POA: Diagnosis not present

## 2023-10-07 DIAGNOSIS — I10 Essential (primary) hypertension: Secondary | ICD-10-CM | POA: Diagnosis not present

## 2023-10-07 DIAGNOSIS — F411 Generalized anxiety disorder: Secondary | ICD-10-CM | POA: Diagnosis not present

## 2023-10-07 DIAGNOSIS — N390 Urinary tract infection, site not specified: Secondary | ICD-10-CM | POA: Diagnosis not present

## 2023-10-07 DIAGNOSIS — C50919 Malignant neoplasm of unspecified site of unspecified female breast: Secondary | ICD-10-CM | POA: Diagnosis not present

## 2023-10-07 DIAGNOSIS — E119 Type 2 diabetes mellitus without complications: Secondary | ICD-10-CM | POA: Diagnosis not present

## 2023-10-07 DIAGNOSIS — F331 Major depressive disorder, recurrent, moderate: Secondary | ICD-10-CM | POA: Diagnosis not present

## 2023-10-18 DIAGNOSIS — H401131 Primary open-angle glaucoma, bilateral, mild stage: Secondary | ICD-10-CM | POA: Diagnosis not present

## 2023-10-18 DIAGNOSIS — H04123 Dry eye syndrome of bilateral lacrimal glands: Secondary | ICD-10-CM | POA: Diagnosis not present

## 2023-10-21 ENCOUNTER — Emergency Department (HOSPITAL_BASED_OUTPATIENT_CLINIC_OR_DEPARTMENT_OTHER)

## 2023-10-21 ENCOUNTER — Emergency Department (HOSPITAL_BASED_OUTPATIENT_CLINIC_OR_DEPARTMENT_OTHER)
Admission: EM | Admit: 2023-10-21 | Discharge: 2023-10-21 | Disposition: A | Discharge status: Home / Self Care | Attending: Emergency Medicine | Admitting: Emergency Medicine

## 2023-10-21 ENCOUNTER — Other Ambulatory Visit: Payer: Self-pay

## 2023-10-21 ENCOUNTER — Encounter (HOSPITAL_BASED_OUTPATIENT_CLINIC_OR_DEPARTMENT_OTHER): Payer: Self-pay

## 2023-10-21 DIAGNOSIS — Z79899 Other long term (current) drug therapy: Secondary | ICD-10-CM | POA: Insufficient documentation

## 2023-10-21 DIAGNOSIS — I10 Essential (primary) hypertension: Secondary | ICD-10-CM | POA: Insufficient documentation

## 2023-10-21 DIAGNOSIS — R350 Frequency of micturition: Secondary | ICD-10-CM | POA: Diagnosis not present

## 2023-10-21 DIAGNOSIS — R109 Unspecified abdominal pain: Secondary | ICD-10-CM | POA: Diagnosis not present

## 2023-10-21 DIAGNOSIS — Z90710 Acquired absence of both cervix and uterus: Secondary | ICD-10-CM | POA: Diagnosis not present

## 2023-10-21 DIAGNOSIS — R140 Abdominal distension (gaseous): Secondary | ICD-10-CM | POA: Insufficient documentation

## 2023-10-21 DIAGNOSIS — R030 Elevated blood-pressure reading, without diagnosis of hypertension: Secondary | ICD-10-CM

## 2023-10-21 LAB — URINALYSIS, ROUTINE W REFLEX MICROSCOPIC
Bilirubin Urine: NEGATIVE
Glucose, UA: NEGATIVE mg/dL
Hgb urine dipstick: NEGATIVE
Ketones, ur: NEGATIVE mg/dL
Leukocytes,Ua: NEGATIVE
Nitrite: NEGATIVE
Protein, ur: NEGATIVE mg/dL
Specific Gravity, Urine: 1.005 (ref 1.005–1.030)
pH: 5.5 (ref 5.0–8.0)

## 2023-10-21 LAB — COMPREHENSIVE METABOLIC PANEL WITH GFR
ALT: 6 U/L (ref 0–44)
AST: 20 U/L (ref 15–41)
Albumin: 4.4 g/dL (ref 3.5–5.0)
Alkaline Phosphatase: 56 U/L (ref 38–126)
Anion gap: 15 (ref 5–15)
BUN: 20 mg/dL (ref 8–23)
CO2: 22 mmol/L (ref 22–32)
Calcium: 9.9 mg/dL (ref 8.9–10.3)
Chloride: 103 mmol/L (ref 98–111)
Creatinine, Ser: 0.83 mg/dL (ref 0.44–1.00)
GFR, Estimated: >60 mL/min (ref >60)
Glucose, Bld: 127 mg/dL — ABNORMAL HIGH (ref 70–99)
Potassium: 4 mmol/L (ref 3.5–5.1)
Sodium: 140 mmol/L (ref 135–145)
Total Bilirubin: 0.3 mg/dL (ref 0.0–1.2)
Total Protein: 7.3 g/dL (ref 6.5–8.1)

## 2023-10-21 LAB — CBC WITH DIFFERENTIAL/PLATELET
Abs Immature Granulocytes: 0.06 K/uL (ref 0.00–0.07)
Basophils Absolute: 0.1 K/uL (ref 0.0–0.1)
Basophils Relative: 1 %
Eosinophils Absolute: 0.2 K/uL (ref 0.0–0.5)
Eosinophils Relative: 2 %
HCT: 43 % (ref 36.0–46.0)
Hemoglobin: 13.7 g/dL (ref 12.0–15.0)
Immature Granulocytes: 1 %
Lymphocytes Relative: 24 %
Lymphs Abs: 2.6 K/uL (ref 0.7–4.0)
MCH: 26.6 pg (ref 26.0–34.0)
MCHC: 31.9 g/dL (ref 30.0–36.0)
MCV: 83.5 fL (ref 80.0–100.0)
Monocytes Absolute: 0.6 K/uL (ref 0.1–1.0)
Monocytes Relative: 5 %
Neutro Abs: 7.3 K/uL (ref 1.7–7.7)
Neutrophils Relative %: 67 %
Platelets: 449 K/uL — ABNORMAL HIGH (ref 150–400)
RBC: 5.15 MIL/uL — ABNORMAL HIGH (ref 3.87–5.11)
RDW: 13.2 % (ref 11.5–15.5)
WBC: 10.8 K/uL — ABNORMAL HIGH (ref 4.0–10.5)
nRBC: 0 % (ref 0.0–0.2)

## 2023-10-21 MED ORDER — IOHEXOL 300 MG/ML  SOLN
100.0000 mL | Freq: Once | INTRAMUSCULAR | Status: AC | PRN
  Administered 2023-10-21: 100 mL via INTRAVENOUS

## 2023-10-21 NOTE — ED Provider Notes (Signed)
 [facility] Provider Note   CSN: [id number] Arrival date & time: 10/21/23  1857     Patient presents with: Recurrent UTI, Chills, and Hypertension   Cindy Wong is a 89 y.o. female.   Pt with c/o recent persistent uti in past two weeks. Was initially on macrobid, and then sensitivities returned and completed a course of cipro . Some urinary frequency persists, no dysuria. Nausea, decreased appetite. No emesis. Abdomen is mildly distended. Has been having bms, no severe constipation. No diarrhea. No back/flank pain. No fever, had chills earlier. Denies chest pain or discomfort. No sob or unusual doe. No cough or uri symptoms. No headache. No extremity pain or swelling. Other than recent antibiotics, no other acute change in meds.   The history is provided by the patient and a relative.  Hypertension Pertinent negatives include no chest pain, no abdominal pain, no headaches and no shortness of breath.       Prior to Admission medications   Medication Sig Start Date End Date Taking? Authorizing Provider  amLODipine  (NORVASC ) 5 MG tablet Take 10 mg by mouth daily.    [provider]  carvedilol  (COREG ) 12.5 MG tablet TAKE 1 TABLET (12.5MG  TOTAL) BY MOUTH TWICE A DAY WITH MEALS 07/29/23   Patwardhan, Manish J, MD  Cholecalciferol (D2000 ULTRA STRENGTH) 50 MCG (2000 UT) CAPS Take 2,000 Units by mouth daily.    [provider]  glipiZIDE  (GLUCOTROL  XL) 10 MG 24 hr tablet Take 10 mg by mouth daily.    [provider]  hydrochlorothiazide  (HYDRODIURIL ) 25 MG tablet Take 25 mg by mouth daily. In the morning 10/14/17   [provider]  latanoprost (XALATAN) 0.005 % ophthalmic solution Place 1 drop into both eyes at bedtime. 01/02/23   [provider]  linagliptin  (TRADJENTA ) 5 MG TABS tablet Take 5 mg by mouth daily. 07/11/23   [provider]  metFORMIN  (GLUCOPHAGE ) 500 MG tablet Take 500 mg by  mouth daily with breakfast. 02/13/22   [provider]  mirtazapine  (REMERON ) 15 MG tablet TAKE 1 TABLET BY MOUTH EVERYDAY AT BEDTIME 10/07/23   Boscia, Heather E, NP  Multiple Vitamins-Minerals (CENTRUM SILVER PO) Take 1 tablet by mouth daily.     [provider]  [facility] VERIO test strip 2 (two) times daily. for testing 08/11/17   [provider]  potassium chloride  SA (K-DUR,KLOR-CON ) 20 MEQ tablet Take 20 mEq by mouth daily.    [provider]  pravastatin  (PRAVACHOL ) 40 MG tablet Take 40 mg by mouth every evening.    [provider]    Allergies: Sulfamethoxazole and Sulfonamide derivatives    Review of Systems  Constitutional:  Positive for chills. Negative for fever.  HENT:  Negative for sore throat.   Eyes:  Negative for visual disturbance.  Respiratory:  Negative for cough and shortness of breath.   Cardiovascular:  Negative for chest pain and leg swelling.  Gastrointestinal:  Positive for nausea. Negative for abdominal pain, diarrhea and vomiting.  Genitourinary:  Positive for frequency. Negative for dysuria and flank pain.  Musculoskeletal:  Negative for back pain and neck pain.  Neurological:  Negative for speech difficulty, weakness, numbness and headaches.    Updated Vital Signs BP (!) 168/82   Pulse 82   Temp 98.4 F (36.9 C) (Oral)   Resp 11   SpO2 99%   Physical Exam Vitals and nursing note reviewed.  Constitutional:      General: She is  not in acute distress.    Appearance: Normal appearance. She is well-developed. She is not diaphoretic.  HENT:     Head: Atraumatic.     Nose: Nose normal.     Mouth/Throat:     Mouth: Mucous membranes are moist.     Pharynx: Oropharynx is clear. No oropharyngeal exudate or posterior oropharyngeal erythema.  Eyes:     General: No scleral icterus.    Conjunctiva/sclera: Conjunctivae normal.     Pupils: Pupils are equal, round, and reactive to light.  Neck:     Thyroid : No  thyromegaly.     Vascular: No carotid bruit.     Trachea: No tracheal deviation.     Comments: Trachea midline, thyroid  not grossly enlarged or tender. No neck stiffness or rigidity.  Cardiovascular:     Rate and Rhythm: Normal rate and regular rhythm.     Heart sounds: Normal heart sounds. No murmur heard.    No friction rub. No gallop.  Pulmonary:     Effort: Pulmonary effort is normal. No respiratory distress.     Breath sounds: Normal breath sounds.  Abdominal:     General: Bowel sounds are normal. There is distension.     Palpations: Abdomen is soft. There is no mass.     Tenderness: There is no abdominal tenderness. There is no guarding.     Comments: Mildly distended.   Genitourinary:    Comments: No cva tenderness. Musculoskeletal:        General: No tenderness. Normal range of motion.     Cervical back: Neck supple.     Right lower leg: No edema.     Left lower leg: No edema.  Skin:    General: Skin is warm and dry.     Findings: No rash.  Neurological:     Mental Status: She is alert and oriented to person, place, and time.     Cranial Nerves: No cranial nerve deficit.     Comments: Motor intact bilaterally. Steady gait. Motor/sens grossly intact bil.      (all labs ordered are listed, but only abnormal results are displayed) Results for orders placed or performed during the hospital encounter of 10/21/23  Urinalysis, Routine w reflex microscopic -Urine, Clean Catch   Collection Time: 10/21/23  7:06 PM  Result Value Ref Range   Color, Urine COLORLESS (A) YELLOW   APPearance CLEAR CLEAR   Specific Gravity, Urine 1.005 1.005 - 1.030   pH 5.5 5.0 - 8.0   Glucose, UA NEGATIVE NEGATIVE mg/dL   Hgb urine dipstick NEGATIVE NEGATIVE   Bilirubin Urine NEGATIVE NEGATIVE   Ketones, ur NEGATIVE NEGATIVE mg/dL   Protein, ur NEGATIVE NEGATIVE mg/dL   Nitrite NEGATIVE NEGATIVE   Leukocytes,Ua NEGATIVE NEGATIVE  CBC with Differential   Collection Time: 10/21/23  7:07 PM   Result Value Ref Range   WBC 10.8 (H) 4.0 - 10.5 K/uL   RBC 5.15 (H) 3.87 - 5.11 MIL/uL   Hemoglobin 13.7 12.0 - 15.0 g/dL   HCT 56.9 63.9 - 53.9 %   MCV 83.5 80.0 - 100.0 fL   MCH 26.6 26.0 - 34.0 pg   MCHC 31.9 30.0 - 36.0 g/dL   RDW 86.7 88.4 - 84.4 %   Platelets 449 (H) 150 - 400 K/uL   nRBC 0.0 0.0 - 0.2 %   Neutrophils Relative % 67 %   Neutro Abs 7.3 1.7 - 7.7 K/uL   Lymphocytes Relative 24 %   Lymphs Abs 2.6 0.7 -  4.0 K/uL   Monocytes Relative 5 %   Monocytes Absolute 0.6 0.1 - 1.0 K/uL   Eosinophils Relative 2 %   Eosinophils Absolute 0.2 0.0 - 0.5 K/uL   Basophils Relative 1 %   Basophils Absolute 0.1 0.0 - 0.1 K/uL   Immature Granulocytes 1 %   Abs Immature Granulocytes 0.06 0.00 - 0.07 K/uL  Comprehensive metabolic panel   Collection Time: 10/21/23  7:07 PM  Result Value Ref Range   Sodium 140 135 - 145 mmol/L   Potassium 4.0 3.5 - 5.1 mmol/L   Chloride 103 98 - 111 mmol/L   CO2 22 22 - 32 mmol/L   Glucose, Bld 127 (H) 70 - 99 mg/dL   BUN 20 8 - 23 mg/dL   Creatinine, Ser 9.16 0.44 - 1.00 mg/dL   Calcium 9.9 8.9 - 89.6 mg/dL   Total Protein 7.3 6.5 - 8.1 g/dL   Albumin 4.4 3.5 - 5.0 g/dL   AST 20 15 - 41 U/L   ALT 6 0 - 44 U/L   Alkaline Phosphatase 56 38 - 126 U/L   Total Bilirubin 0.3 0.0 - 1.2 mg/dL   GFR, Estimated >39 >39 mL/min   Anion gap 15 5 - 15      EKG: EKG Interpretation Date/Time:  Monday October 21 2023 20:08:37 EDT Ventricular Rate:  86 PR Interval:  174 QRS Duration:  78 QT Interval:  394 QTC Calculation: 472 R Axis:   14  Text Interpretation: Sinus rhythm with frequent Premature ventricular complexes Left ventricular hypertrophy Nonspecific ST abnormality No significant change since last tracing `except pvcs Confirmed by Bernard Drivers ([id number]) on 10/21/2023 8:16:29 PM  Radiology: CT ABDOMEN PELVIS W CONTRAST Result Date: 10/21/2023 CLINICAL DATA:  Abdominal pain and bloating, initial encounter EXAM: CT ABDOMEN AND PELVIS WITH  CONTRAST TECHNIQUE: Multidetector CT imaging of the abdomen and pelvis was performed using the standard protocol following bolus administration of intravenous contrast. RADIATION DOSE REDUCTION: This exam was performed according to the departmental dose-optimization program which includes automated exposure control, adjustment of the mA and/or kV according to patient size and/or use of iterative reconstruction technique. CONTRAST:  OMNIPAQUE  IOHEXOL  300 MG/ML  SOLN COMPARISON:  03/14/2022 PET-CT FINDINGS: Lower chest: No acute abnormality. Changes of bilateral mastectomy are noted. Hepatobiliary: No focal liver abnormality is seen. Status post cholecystectomy. No biliary dilatation. Pancreas: Unremarkable. No pancreatic ductal dilatation or surrounding inflammatory changes. Spleen: Normal in size without focal abnormality. Adrenals/Urinary Tract: Adrenal glands are within normal limits. Kidneys demonstrate a normal enhancement pattern bilaterally. Scattered cysts are again seen and stable. No follow-up is recommended. No obstructive changes are seen. The bladder is well distended. Stomach/Bowel: Scattered fecal material is noted throughout the colon. The appendix is not well visualized. No inflammatory changes to suggest appendicitis are seen. Small bowel appears within normal limits. Postsurgical changes in the stomach are noted without acute abnormality. Vascular/Lymphatic: Aortic atherosclerosis. No enlarged abdominal or pelvic lymph nodes. Reproductive: Status post hysterectomy. No adnexal masses. Other: No abdominal wall hernia or abnormality. No abdominopelvic ascites. Musculoskeletal: Degenerative changes of lumbar spine are noted. No acute bony abnormality is seen. IMPRESSION: No acute abnormality is noted to correspond with the given clinical history. Electronically Signed   By: Oneil Devonshire M.D.   On: 10/21/2023 21:14     Procedures   Medications Ordered in the ED  iohexol  (OMNIPAQUE ) 300  MG/ML solution 100 mL (100 mLs Intravenous Contrast Given 10/21/23 2022)  Medical Decision Making Problems Addressed: Elevated blood pressure reading: acute illness or injury Essential hypertension: chronic illness or injury with exacerbation, progression, or side effects of treatment that poses a threat to life or bodily functions Urinary frequency: acute illness or injury    Details: Recent history of uti  Amount and/or Complexity of Data Reviewed Independent Historian:     Details: Family, hx External Data Reviewed: labs and notes. Labs: ordered. Decision-making details documented in ED Course. Radiology: ordered and independent interpretation performed. Decision-making details documented in ED Course. ECG/medicine tests: ordered and independent interpretation performed. Decision-making details documented in ED Course.  Risk Prescription drug management. Decision regarding hospitalization.   Iv ns. Continuous pulse ox and cardiac monitoring. Labs ordered/sent. Imaging ordered.   Differential diagnosis includes uti, dehydration, anemia, etc. Dispo decision including potential need for admission considered - will get labs and imaging and reassess.   Reviewed nursing notes and prior charts for additional history. External reports reviewed. Additional history from: family.   Cardiac monitor: sinus rhythm, rate 90.  Labs reviewed/interpreted by me - wbc 10, hct 36. Chem unremarkable. Ua neg for uti.   CT reviewed/interpreted by me - no acute process.   Recheck pt comfortable appearing, no distress. No current pain or discomfort.  No nv.   Pt currently appears stable for ed d/c.  Rec close pcp f/u.  Return precautions provided.       Final diagnoses:  Urinary frequency  Elevated blood pressure reading  Essential hypertension    ED Discharge Orders     None          Bernard Drivers, MD 10/21/23 2134

## 2023-10-21 NOTE — ED Triage Notes (Signed)
 Patient reports battling what is believed to be a UTI for 2 weeks. She was initially started on Macrobid. She states they told her to stop taking this one and to start ciprofloxacin . She reports urinary incontinence at night, as well as feeling sick during the day. She also has some hypertension as well at home. She reports feeling some anxiousness as well.

## 2023-10-21 NOTE — Discharge Instructions (Signed)
 It was our pleasure to provide your ER care today - we hope that you feel better.  Drink plenty of fluids/stay well hydrated.  Follow up closely with primary care doctor in the coming week.  Return to ER if worse, new symptoms, fevers, new/severe pain, vomiting, chest pain, trouble breathing, fainting, or other concern.

## 2023-10-22 DIAGNOSIS — R351 Nocturia: Secondary | ICD-10-CM | POA: Diagnosis not present

## 2023-10-22 DIAGNOSIS — I10 Essential (primary) hypertension: Secondary | ICD-10-CM | POA: Diagnosis not present

## 2023-10-22 DIAGNOSIS — F411 Generalized anxiety disorder: Secondary | ICD-10-CM | POA: Diagnosis not present

## 2023-10-25 DIAGNOSIS — N390 Urinary tract infection, site not specified: Secondary | ICD-10-CM | POA: Diagnosis not present

## 2023-10-25 DIAGNOSIS — R300 Dysuria: Secondary | ICD-10-CM | POA: Diagnosis not present

## 2023-10-25 DIAGNOSIS — Z23 Encounter for immunization: Secondary | ICD-10-CM | POA: Diagnosis not present

## 2023-10-25 DIAGNOSIS — F419 Anxiety disorder, unspecified: Secondary | ICD-10-CM | POA: Diagnosis not present

## 2023-10-28 DIAGNOSIS — E119 Type 2 diabetes mellitus without complications: Secondary | ICD-10-CM | POA: Diagnosis not present

## 2023-10-28 DIAGNOSIS — Z780 Asymptomatic menopausal state: Secondary | ICD-10-CM | POA: Diagnosis not present

## 2023-10-28 DIAGNOSIS — I10 Essential (primary) hypertension: Secondary | ICD-10-CM | POA: Diagnosis not present

## 2023-10-28 DIAGNOSIS — E559 Vitamin D deficiency, unspecified: Secondary | ICD-10-CM | POA: Diagnosis not present

## 2023-10-28 DIAGNOSIS — E7849 Other hyperlipidemia: Secondary | ICD-10-CM | POA: Diagnosis not present

## 2023-10-28 DIAGNOSIS — E7800 Pure hypercholesterolemia, unspecified: Secondary | ICD-10-CM | POA: Diagnosis not present

## 2023-11-05 DIAGNOSIS — E559 Vitamin D deficiency, unspecified: Secondary | ICD-10-CM | POA: Diagnosis not present

## 2023-11-05 DIAGNOSIS — I10 Essential (primary) hypertension: Secondary | ICD-10-CM | POA: Diagnosis not present

## 2023-11-05 DIAGNOSIS — F411 Generalized anxiety disorder: Secondary | ICD-10-CM | POA: Diagnosis not present

## 2023-11-05 DIAGNOSIS — E119 Type 2 diabetes mellitus without complications: Secondary | ICD-10-CM | POA: Diagnosis not present

## 2023-11-05 DIAGNOSIS — Z1339 Encounter for screening examination for other mental health and behavioral disorders: Secondary | ICD-10-CM | POA: Diagnosis not present

## 2023-11-05 DIAGNOSIS — Z0000 Encounter for general adult medical examination without abnormal findings: Secondary | ICD-10-CM | POA: Diagnosis not present

## 2023-11-05 DIAGNOSIS — C50919 Malignant neoplasm of unspecified site of unspecified female breast: Secondary | ICD-10-CM | POA: Diagnosis not present

## 2023-11-05 DIAGNOSIS — K222 Esophageal obstruction: Secondary | ICD-10-CM | POA: Diagnosis not present

## 2023-11-05 DIAGNOSIS — R82998 Other abnormal findings in urine: Secondary | ICD-10-CM | POA: Diagnosis not present

## 2023-11-05 DIAGNOSIS — Z9010 Acquired absence of unspecified breast and nipple: Secondary | ICD-10-CM | POA: Diagnosis not present

## 2023-11-05 DIAGNOSIS — F331 Major depressive disorder, recurrent, moderate: Secondary | ICD-10-CM | POA: Diagnosis not present

## 2023-11-05 DIAGNOSIS — E7800 Pure hypercholesterolemia, unspecified: Secondary | ICD-10-CM | POA: Diagnosis not present

## 2023-11-05 DIAGNOSIS — Z1331 Encounter for screening for depression: Secondary | ICD-10-CM | POA: Diagnosis not present

## 2023-11-11 ENCOUNTER — Other Ambulatory Visit (HOSPITAL_COMMUNITY): Payer: Self-pay

## 2023-11-19 DIAGNOSIS — Z23 Encounter for immunization: Secondary | ICD-10-CM | POA: Diagnosis not present

## 2023-12-02 ENCOUNTER — Telehealth: Payer: Self-pay

## 2023-12-02 DIAGNOSIS — F330 Major depressive disorder, recurrent, mild: Secondary | ICD-10-CM | POA: Diagnosis not present

## 2023-12-02 DIAGNOSIS — F411 Generalized anxiety disorder: Secondary | ICD-10-CM | POA: Diagnosis not present

## 2023-12-02 DIAGNOSIS — F5101 Primary insomnia: Secondary | ICD-10-CM | POA: Diagnosis not present

## 2023-12-02 NOTE — Telephone Encounter (Signed)
 2026 Renewal  PAP: Patient assistance application for Tradjenta  through [employer]) has been mailed to pt's home address on file. Provider portion of application will be faxed to provider's office.  Provider portion of application will be faxed to Dr. Richard Tisovec at [facility]

## 2023-12-10 ENCOUNTER — Ambulatory Visit: Admitting: Podiatry

## 2023-12-10 ENCOUNTER — Encounter: Payer: Self-pay | Admitting: Podiatry

## 2023-12-10 DIAGNOSIS — E1151 Type 2 diabetes mellitus with diabetic peripheral angiopathy without gangrene: Secondary | ICD-10-CM | POA: Diagnosis not present

## 2023-12-10 DIAGNOSIS — M79676 Pain in unspecified toe(s): Secondary | ICD-10-CM | POA: Diagnosis not present

## 2023-12-10 DIAGNOSIS — B351 Tinea unguium: Secondary | ICD-10-CM

## 2023-12-10 NOTE — Progress Notes (Signed)
 This patient returns to my office for at risk foot care.  This patient requires this care by a professional since this patient will be at risk due to having diabetes.   This patient is unable to cut nails herself since the patient cannot reach her nails.  These nails is painful walking and wearing her shoes.   This patient presents for at risk foot care today.  General Appearance  Alert, conversant and in no acute stress.  Vascular  Dorsalis pedis  pulses are palpable  Bilaterally. Posterior tibial pulses are absent  B/L.  Capillary return is within normal limits  bilaterally. Temperature is within normal limits  Bilaterally  .Absent digital hair.  Neurologic  Senn-Weinstein monofilament wire test within normal limits  bilaterally. Muscle power within normal limits bilaterally.  Nails Thick disfigured discolored nails with subungual debris  from hallux to fifth toes bilaterally. No evidence of bacterial infection or drainage bilaterally.  Orthopedic  No limitations of motion  feet .  No crepitus or effusions noted.  No bony pathology or digital deformities noted.  midfoot arthritis  B/L.  Skin  normotropic skin with no porokeratosis noted bilaterally.  No signs of infections or ulcers noted.   asymptomatic  HD 5th toe left.    Onychomycosis  Pain in right toes  Pain in left toes  .    Consent was obtained for treatment procedures.   Mechanical debridement of nails 1-5  bilaterally performed with a nail nipper.  Filed with dremel without incident.   Return office visit    3 months                  Told patient to return for periodic foot care and evaluation due to potential at risk complications.   Cordella Bold DPM

## 2023-12-17 DIAGNOSIS — L821 Other seborrheic keratosis: Secondary | ICD-10-CM | POA: Diagnosis not present

## 2023-12-17 DIAGNOSIS — L810 Postinflammatory hyperpigmentation: Secondary | ICD-10-CM | POA: Diagnosis not present

## 2024-01-10 NOTE — Telephone Encounter (Signed)
 Left voice mail for patient to return my call regarding 2026 patient assistance renewal application mailed to her home 10/13

## 2024-01-22 NOTE — Telephone Encounter (Signed)
 Received patient portion of patient assistance application  Will refax provider portion to Dr. Tisovec

## 2024-02-25 DIAGNOSIS — C50912 Malignant neoplasm of unspecified site of left female breast: Secondary | ICD-10-CM

## 2024-02-25 NOTE — Progress Notes (Signed)
 " Patient Care Team: Tisovec, Charlie ORN, MD as PCP - General Patwardhan, Newman PARAS, MD as PCP - Cardiology (Cardiology) Tyree Nanetta SAILOR, RN as Oncology Nurse Navigator Lanny Callander, MD as Consulting Physician (Hematology) Ann Mayme POUR, NP as Nurse Practitioner (Nurse Practitioner) Belinda Cough, MD as Consulting Physician (General Surgery) [facility], [facility] as Consulting Physician (Nephrology)  Clinic Day:  02/26/2024  Referring physician: Tisovec, Richard W, MD  ASSESSMENT & PLAN:   Assessment & Plan: Malignant neoplasm of central left breast (HCC)  grade 2, ER 95% strongly positive, PR 95% strongly positive, HER-2 negative (1+) Ki-67 10%, cT2 N0 M0 stage Ib; recurrence left subQ chest wall 01/2022 -Diagnosed in 10/2019, s/p left mastectomy by Dr Alphonsa on 12/08/19. Her surgical path showed grade II IDC, 2.8 cm, with clear margins. Due to negative lymph nodes she did not require postmastectomy radiation.  -She tried anastrozole  07/07/20 - 12/2020, stopped due to SE's (caused sluggishness and difficulty with normal activities). She has been on surveillance alone -During routine surveillance visit she was found to have localized chest wall subq recurrence of previous IDC, ER/PR both 100% strongly positive, HER2 negative, Ki 67 10% confirmed on biopsy 02/06/22.  -Staging PET scan negative for distant recurrence/metastasis -She underwent wide excision of left chest wall recurrence by Dr. Belinda on 03/06/22, path shows 0.7 cm IDA involving dermis of the skin, all margins negative -Started adjuvant Tamoxifen  Feb/March 2024, tolerating well until ~06/2022 she developed diarrhea, weakness, lacking motivation, as well as other vague symptoms such as shakes and roaring in her ear. Tamoxifen  was held -08/01/22 virtual visit: she appeared improved but still not to her baseline per her report, although no specific complaints. Tamoxifen  remained on hold -Cindy Wong is clinically doing well.  It  took her 2 months to recover from Tamoxifen  SE's, motivation and energy are back to baseline.   -Further antiestrogen therapy was discussed. She did not tolerate AI's in the past. Low dose tamoxifen  10 mg was discussed versus remaining off antiestrogen altogether.  After lengthy discussion she prefers to d/c tamoxifen  -Labs reviewed, CBC normal, CMP stable.  -Continue surveillance with labs and follow-up in 6 months, sooner if needed.   high blood sugar  Recently saw her PCP.  HgbA1c was 8.0. feels like this was related to weight gain. Previously, was trying to gain weight. Has been making dietary changes, intake of carbohydrates and sugar.  She is trying to stay physically active.  Will have HgbA1c checked again in few months.  She currently takes Tradjenta  and metformin .  Depression Patient is currently taking mirtazapine  due to anxiety and difficulty sleeping.  She did see a therapist but did not find it helpful.  Feels as though her anxiety and depression are both well-managed and would like to continue on her diazepam  without changes.  Bone health DEXA scan was performed by patient's primary care provider. Prior imaging from 2019 and 2022 has shown normal bone mass.  Plan Labs reviewed. -CBC and CMP both unremarkable. No changes noted in bilateral chest wall. No role for breast imaging due to bilateral mastectomies. Continue breast cancer surveillance.  Patient prefers to continue with surveillance at cancer center here. Labs and follow-up in 6 months, sooner if needed.   The patient understands the plans discussed today and is in agreement with them.  She knows to contact our office if she develops concerns prior to her next appointment.  I provided 25 minutes of face-to-face time during this encounter and > 50%  was spent counseling as documented under my assessment and plan.    Powell FORBES Lessen, NP  [facility] - A DEPT OF [facility] [address] [place] [address] Dept: [phone] Dept Fax: [phone]   No orders of the defined types were placed in this encounter.     CHIEF COMPLAINT:  CC: Left breast cancer, ER +  Current Treatment: Surveillance  INTERVAL HISTORY:  Cindy Wong is here today for repeat clinical assessment.  She last saw me on 08/28/2023.  States her blood sugar has been high.  Recent visit to PCP showed Hgb A1c of 8.0.  She has made dietary changes, limiting intake of carbohydrates and sugar.  She is trying to become more physically active.  Would like to avoid adjustment of diabetic medications.  She reports improved and stable anxiety/depression.  Continues mirtazapine  15 mg without negative side effects.  She denies chest pain, chest pressure, or shortness of breath. She denies headaches or visual disturbances. She denies abdominal pain, nausea, vomiting, or changes in bowel or bladder habits.  She denies fevers or chills. She denies pain. Her appetite is good. Her weight has increased 3 pounds over last 6 months.  I have reviewed the past medical history, past surgical history, social history and family history with the patient and they are unchanged from previous note.  ALLERGIES:  is allergic to sulfamethoxazole and sulfonamide derivatives.  MEDICATIONS:  Current Outpatient Medications  Medication Sig Dispense Refill   amLODipine  (NORVASC ) 5 MG tablet Take 10 mg by mouth daily.     carvedilol  (COREG ) 12.5 MG tablet TAKE 1 TABLET (12.5MG  TOTAL) BY MOUTH TWICE A DAY WITH MEALS 180 tablet 3   Cholecalciferol (D2000 ULTRA STRENGTH) 50 MCG (2000 UT) CAPS Take 2,000 Units by mouth daily.     glipiZIDE  (GLUCOTROL  XL) 10 MG 24 hr tablet Take 10 mg by mouth daily.     hydrochlorothiazide  (HYDRODIURIL ) 25 MG tablet Take 25 mg by mouth daily. In the morning  10   latanoprost (XALATAN) 0.005 % ophthalmic solution Place 1 drop into both eyes at bedtime.     linagliptin   (TRADJENTA ) 5 MG TABS tablet Take 5 mg by mouth daily.     metFORMIN  (GLUCOPHAGE ) 500 MG tablet Take 500 mg by mouth daily with breakfast.     mirtazapine  (REMERON ) 15 MG tablet TAKE 1 TABLET BY MOUTH EVERYDAY AT BEDTIME 90 tablet 1   Multiple Vitamins-Minerals (CENTRUM SILVER PO) Take 1 tablet by mouth daily.      ONETOUCH VERIO test strip 2 (two) times daily. for testing  2   potassium chloride  SA (K-DUR,KLOR-CON ) 20 MEQ tablet Take 20 mEq by mouth daily.     pravastatin  (PRAVACHOL ) 40 MG tablet Take 40 mg by mouth every evening.     No current facility-administered medications for this visit.    HISTORY OF PRESENT ILLNESS:   Oncology History Overview Note  Cancer Staging Malignant neoplasm of central left breast [facility]) Staging form: Breast, AJCC 8th Edition - Clinical stage from 11/19/2019: Stage IB (cT2, cN0, cM0, G2, ER+, PR+, HER2-) - Signed by Burton, Lacie K, NP on 11/23/2019 - Pathologic stage from 12/08/2019: Stage Unknown (pT2, pNX, cM0, G2, ER+, PR+, HER2-) - Signed by Lanny Callander, MD on 12/25/2019    Malignant neoplasm of central left breast (HCC)  11/09/2019 Breast US    IMPRESSION: 1. Highly suspicious left breast mass at the 12 o'clock position 4  cm from the nipple. Recommendation is for ultrasound-guided biopsy. 2. Indeterminate hyperechoic mass at the 12 o'clock position 4 cm from the nipple, just superior to the index lesion. This may correspond with an additional asymmetry seen mammographically. Recommendation is for ultrasound-guided biopsy with close attention on post clip films to ensure mammographic correlation. 3. No suspicious left axillary lymphadenopathy. 4. No suspicious findings at the site of the right mastectomy bed palpable lump.   11/19/2019 Cancer Staging   Staging form: Breast, AJCC 8th Edition - Clinical stage from 11/19/2019: Stage IB (cT2, cN0, cM0, G2, ER+, PR+, HER2-) - Signed by Burton, Lacie K, NP on 11/23/2019   11/19/2019 Initial Biopsy    Diagnosis Breast, left, needle core biopsy, 12 o'clock - INVASIVE MAMMARY CARCINOMA - SEE COMMENT E-cadherin is POSITIVE supporting a ductal origin. PROGNOSTIC INDICATORS Results: IMMUNOHISTOCHEMICAL AND MORPHOMETRIC ANALYSIS PERFORMED MANUALLY The tumor cells are NEGATIVE for Her2 (1+). Estrogen Receptor: 95%, POSITIVE, STRONG STAINING INTENSITY Progesterone Receptor: 95%, POSITIVE, STRONG STAINING INTENSITY Proliferation Marker Ki67: 10%   11/23/2019 Initial Diagnosis   Malignant neoplasm of left breast (HCC)   11/29/2019 Breast MRI   IMPRESSION: 1. 3.1 cm biopsy-proven malignancy within the UPPER LEFT breast. 2. Indeterminate 0.9 cm posterior central/LOWER LEFT breast mass. If breast conservation is desired, 2nd-look ultrasound and possible biopsy is recommended. If this mass is not identified sonographically, than MR guided biopsy would be recommended. 3. No abnormal appearing lymph nodes. 4. RIGHT mastectomy without suspicious abnormalities in the RIGHT mastectomy bed.   12/08/2019 Cancer Staging   Staging form: Breast, AJCC 8th Edition - Pathologic stage from 12/08/2019: Stage Unknown (pT2, pNX, cM0, G2, ER+, PR+, HER2-) - Signed by Lanny Callander, MD on 12/25/2019   12/08/2019 Surgery   Left Mastectomy by Dr Belinda   12/08/2019 Pathology Results   FINAL MICROSCOPIC DIAGNOSIS:   A. BREAST, LEFT, MASTECTOMY:  - Invasive ductal carcinoma, multifocal, 2.8 cm in greatest dimension,  Nottingham grade 2 of 3.  - Margins of resection are not involved.  - See oncology table.  - See oncology table.    03/2020 -  Anti-estrogen oral therapy   Anastrozole  1mg  once daily starting 01/2020   03/22/2020 Survivorship   SCP delivered by Lacie Burton, NP        REVIEW OF SYSTEMS:   Constitutional: Denies fevers, chills or abnormal weight loss Eyes: Denies blurriness of vision Ears, nose, mouth, throat, and face: Denies mucositis or sore throat Respiratory: Denies cough, dyspnea or  wheezes Cardiovascular: Denies palpitation, chest discomfort or lower extremity swelling Gastrointestinal:  Denies nausea, heartburn or change in bowel habits Skin: Denies abnormal skin rashes Lymphatics: Denies new lymphadenopathy or easy bruising Neurological:Denies numbness, tingling or new weaknesses Behavioral/Psych: Mood is stable, no new changes  All other systems were reviewed with the patient and are negative.   VITALS:   Today's Vitals   02/26/24 0900 02/26/24 0935 02/26/24 0936  BP: (!) 204/71 (!) 202/59 (!) 150/66  Pulse: 82 83 67  Resp: 18    Temp: 98.2 F (36.8 C)    TempSrc: Temporal    SpO2: 95% 96% 100%  Weight: 153 lb 3.2 oz (69.5 kg)    PainSc: 0-No pain     Body mass index is 23.29 kg/m.   Wt Readings from Last 3 Encounters:  02/26/24 153 lb 3.2 oz (69.5 kg)  08/28/23 150 lb (68 kg)  07/02/23 147 lb 12.8 oz (67 kg)    Body mass index is 23.29 kg/m.  Performance status (  ECOG): 1 - Symptomatic but completely ambulatory  PHYSICAL EXAM:   GENERAL:alert, no distress and comfortable SKIN: skin color, texture, turgor are normal, no rashes or significant lesions EYES: normal, Conjunctiva are pink and non-injected, sclera clear OROPHARYNX:no exudate, no erythema and lips, buccal mucosa, and tongue normal  NECK: supple, thyroid  normal size, non-tender, without nodularity LYMPH:  no palpable lymphadenopathy in the cervical, axillary or inguinal LUNGS: clear to auscultation and percussion with normal breathing effort HEART: regular rate & rhythm and no murmurs and no lower extremity edema ABDOMEN:abdomen soft, non-tender and normal bowel sounds Musculoskeletal:no cyanosis of digits and no clubbing  NEURO: alert & oriented x 3 with fluent speech, no focal motor/sensory deficits BREAST: Well-healed bilateral mastectomy scars.  No lumps or masses palpated around the chest wall.  No axillary lymphadenopathy bilaterally.  LABORATORY DATA:  I have reviewed the  data as listed    Component Value Date/Time   NA 142 02/26/2024 0902   NA 142 11/05/2016 1040   K 4.1 02/26/2024 0902   K 4.1 11/05/2016 1040   CL 106 02/26/2024 0902   CL 109 (H) 10/23/2011 1527   CO2 27 02/26/2024 0902   CO2 25 11/05/2016 1040   GLUCOSE 202 (H) 02/26/2024 0902   GLUCOSE 127 11/05/2016 1040   GLUCOSE 107 (H) 10/23/2011 1527   BUN 20 02/26/2024 0902   BUN 17.9 11/05/2016 1040   CREATININE 0.90 02/26/2024 0902   CREATININE 0.9 11/05/2016 1040   CALCIUM 9.4 02/26/2024 0902   CALCIUM 10.3 11/05/2016 1040   PROT 6.9 02/26/2024 0902   PROT 7.6 11/05/2016 1040   ALBUMIN 4.2 02/26/2024 0902   ALBUMIN 4.1 11/05/2016 1040   AST 21 02/26/2024 0902   AST 21 11/05/2016 1040   ALT 14 02/26/2024 0902   ALT 17 11/05/2016 1040   ALKPHOS 50 02/26/2024 0902   ALKPHOS 55 11/05/2016 1040   BILITOT 0.4 02/26/2024 0902   BILITOT 0.61 11/05/2016 1040   GFRNONAA >60 02/26/2024 0902   GFRAA >60 11/23/2019 1049    Lab Results  Component Value Date   WBC 8.8 02/26/2024   NEUTROABS 5.4 02/26/2024   HGB 14.1 02/26/2024   HCT 44.3 02/26/2024   MCV 83.0 02/26/2024   PLT 294 02/26/2024     "

## 2024-02-25 NOTE — Assessment & Plan Note (Signed)
 grade 2, ER 95% strongly positive, PR 95% strongly positive, HER-2 negative (1+) Ki-67 10%, cT2 N0 M0 stage Ib; recurrence left subQ chest wall 01/2022 -Diagnosed in 10/2019, s/p left mastectomy by Dr Alphonsa on 12/08/19. Her surgical path showed grade II IDC, 2.8 cm, with clear margins. Due to negative lymph nodes she did not require postmastectomy radiation.  -She tried anastrozole  07/07/20 - 12/2020, stopped due to SE's (caused sluggishness and difficulty with normal activities). She has been on surveillance alone -During routine surveillance visit she was found to have localized chest wall subq recurrence of previous IDC, ER/PR both 100% strongly positive, HER2 negative, Ki 67 10% confirmed on biopsy 02/06/22.  -Staging PET scan negative for distant recurrence/metastasis -She underwent wide excision of left chest wall recurrence by Dr. Belinda on 03/06/22, path shows 0.7 cm IDA involving dermis of the skin, all margins negative -Started adjuvant Tamoxifen  Feb/March 2024, tolerating well until ~06/2022 she developed diarrhea, weakness, lacking motivation, as well as other vague symptoms such as shakes and roaring in her ear. Tamoxifen  was held -08/01/22 virtual visit: she appeared improved but still not to her baseline per her report, although no specific complaints. Tamoxifen  remained on hold -Cindy Wong is clinically doing well.  It took her 2 months to recover from Tamoxifen  SE's, motivation and energy are back to baseline.   -Further antiestrogen therapy was discussed. She did not tolerate AI's in the past. Low dose tamoxifen  10 mg was discussed versus remaining off antiestrogen altogether.  After lengthy discussion she prefers to d/c tamoxifen  -Labs reviewed, CBC normal, CMP stable.  -Continue surveillance with labs and follow-up in 6 months, sooner if needed.

## 2024-02-25 NOTE — Telephone Encounter (Signed)
 PAP: Patient assistance application for Tradjenta  has been approved by PAP Companies: [employer] from 02/20/2024 to 02/18/2025. Medication should be delivered to PAP Delivery: Home. For further shipping updates, please contact [employer] at [phone]. Patient ID is: no ID given

## 2024-02-26 ENCOUNTER — Inpatient Hospital Stay: Attending: Nurse Practitioner

## 2024-02-26 ENCOUNTER — Inpatient Hospital Stay: Admitting: Nurse Practitioner

## 2024-02-26 VITALS — BP 150/66 | HR 67 | Temp 98.2°F | Resp 18 | Wt 153.2 lb

## 2024-02-26 DIAGNOSIS — F419 Anxiety disorder, unspecified: Secondary | ICD-10-CM | POA: Diagnosis not present

## 2024-02-26 DIAGNOSIS — Z9013 Acquired absence of bilateral breasts and nipples: Secondary | ICD-10-CM | POA: Diagnosis not present

## 2024-02-26 DIAGNOSIS — Z1721 Progesterone receptor positive status: Secondary | ICD-10-CM | POA: Insufficient documentation

## 2024-02-26 DIAGNOSIS — Z882 Allergy status to sulfonamides status: Secondary | ICD-10-CM | POA: Diagnosis not present

## 2024-02-26 DIAGNOSIS — Z79811 Long term (current) use of aromatase inhibitors: Secondary | ICD-10-CM | POA: Insufficient documentation

## 2024-02-26 DIAGNOSIS — F32A Depression, unspecified: Secondary | ICD-10-CM | POA: Diagnosis not present

## 2024-02-26 DIAGNOSIS — Z170 Estrogen receptor positive status [ER+]: Secondary | ICD-10-CM | POA: Insufficient documentation

## 2024-02-26 DIAGNOSIS — Z79899 Other long term (current) drug therapy: Secondary | ICD-10-CM | POA: Insufficient documentation

## 2024-02-26 DIAGNOSIS — Z1732 Human epidermal growth factor receptor 2 negative status: Secondary | ICD-10-CM | POA: Insufficient documentation

## 2024-02-26 DIAGNOSIS — C50112 Malignant neoplasm of central portion of left female breast: Secondary | ICD-10-CM | POA: Diagnosis present

## 2024-02-26 DIAGNOSIS — C50912 Malignant neoplasm of unspecified site of left female breast: Secondary | ICD-10-CM

## 2024-02-26 DIAGNOSIS — N6489 Other specified disorders of breast: Secondary | ICD-10-CM | POA: Insufficient documentation

## 2024-02-26 LAB — CMP (CANCER CENTER ONLY)
ALT: 14 U/L (ref 0–44)
AST: 21 U/L (ref 15–41)
Albumin: 4.2 g/dL (ref 3.5–5.0)
Alkaline Phosphatase: 50 U/L (ref 38–126)
Anion gap: 9 (ref 5–15)
BUN: 20 mg/dL (ref 8–23)
CO2: 27 mmol/L (ref 22–32)
Calcium: 9.4 mg/dL (ref 8.9–10.3)
Chloride: 106 mmol/L (ref 98–111)
Creatinine: 0.9 mg/dL (ref 0.44–1.00)
GFR, Estimated: >60 mL/min
Glucose, Bld: 202 mg/dL — ABNORMAL HIGH (ref 70–99)
Potassium: 4.1 mmol/L (ref 3.5–5.1)
Sodium: 142 mmol/L (ref 135–145)
Total Bilirubin: 0.4 mg/dL (ref 0.0–1.2)
Total Protein: 6.9 g/dL (ref 6.5–8.1)

## 2024-02-26 LAB — CBC WITH DIFFERENTIAL (CANCER CENTER ONLY)
Abs Immature Granulocytes: 0.04 K/uL (ref 0.00–0.07)
Basophils Absolute: 0.1 K/uL (ref 0.0–0.1)
Basophils Relative: 1 %
Eosinophils Absolute: 0.2 K/uL (ref 0.0–0.5)
Eosinophils Relative: 2 %
HCT: 44.3 % (ref 36.0–46.0)
Hemoglobin: 14.1 g/dL (ref 12.0–15.0)
Immature Granulocytes: 1 %
Lymphocytes Relative: 29 %
Lymphs Abs: 2.5 K/uL (ref 0.7–4.0)
MCH: 26.4 pg (ref 26.0–34.0)
MCHC: 31.8 g/dL (ref 30.0–36.0)
MCV: 83 fL (ref 80.0–100.0)
Monocytes Absolute: 0.6 K/uL (ref 0.1–1.0)
Monocytes Relative: 7 %
Neutro Abs: 5.4 K/uL (ref 1.7–7.7)
Neutrophils Relative %: 60 %
Platelet Count: 294 K/uL (ref 150–400)
RBC: 5.34 MIL/uL — ABNORMAL HIGH (ref 3.87–5.11)
RDW: 14.1 % (ref 11.5–15.5)
WBC Count: 8.8 K/uL (ref 4.0–10.5)
nRBC: 0 % (ref 0.0–0.2)

## 2024-03-09 ENCOUNTER — Encounter: Payer: Self-pay | Admitting: Nurse Practitioner

## 2024-03-10 ENCOUNTER — Ambulatory Visit: Admitting: Podiatry

## 2024-03-10 ENCOUNTER — Encounter: Payer: Self-pay | Admitting: Podiatry

## 2024-03-10 DIAGNOSIS — B351 Tinea unguium: Secondary | ICD-10-CM

## 2024-03-10 DIAGNOSIS — E1151 Type 2 diabetes mellitus with diabetic peripheral angiopathy without gangrene: Secondary | ICD-10-CM

## 2024-03-10 DIAGNOSIS — L84 Corns and callosities: Secondary | ICD-10-CM

## 2024-03-10 NOTE — Progress Notes (Signed)
 This patient returns to my office for at risk foot care.  This patient requires this care by a professional since this patient will be at risk due to having diabetes.   This patient is unable to cut nails herself since the patient cannot reach her nails.  These nails is painful walking and wearing her shoes.   This patient presents for at risk foot care today.  General Appearance  Alert, conversant and in no acute stress.  Vascular  Dorsalis pedis  pulses are weakly palpable  Bilaterally. Posterior tibial pulses are absent  B/L.  Capillary return is within normal limits  bilaterally. Temperature is within normal limits  Bilaterally  .Absent digital hair.  Neurologic  Senn-Weinstein monofilament wire test within normal limits  bilaterally. Muscle power within normal limits bilaterally.  Nails Thick disfigured discolored nails with subungual debris  from hallux to fifth toes bilaterally. No evidence of bacterial infection or drainage bilaterally.  Orthopedic  No limitations of motion  feet .  No crepitus or effusions noted.  No bony pathology or digital deformities noted.  midfoot arthritis  B/L.  Skin  normotropic skin with no porokeratosis noted bilaterally.  No signs of infections or ulcers noted.   asymptomatic  HD 5th toe left.    Onychomycosis  Pain in right toes  Pain in left toes  .    Consent was obtained for treatment procedures.   Mechanical debridement of nails 1-5  bilaterally performed with a nail nipper.  Filed with dremel without incident.   Return office visit    3 months                  Told patient to return for periodic foot care and evaluation due to potential at risk complications.   Cordella Bold DPM

## 2024-03-20 ENCOUNTER — Other Ambulatory Visit: Payer: Self-pay | Admitting: Nurse Practitioner

## 2024-06-09 ENCOUNTER — Ambulatory Visit: Admitting: Podiatry

## 2024-08-26 ENCOUNTER — Inpatient Hospital Stay

## 2024-08-26 ENCOUNTER — Inpatient Hospital Stay: Admitting: Nurse Practitioner
